# Patient Record
Sex: Female | Born: 1955 | Race: Black or African American | Hispanic: No | Smoking: Former smoker
Health system: Southern US, Community
[De-identification: ages and names within clinical notes are randomized; demographics above are authoritative.]

## PROBLEM LIST (undated history)

## (undated) DIAGNOSIS — M549 Dorsalgia, unspecified: Secondary | ICD-10-CM

## (undated) DIAGNOSIS — C801 Malignant (primary) neoplasm, unspecified: Secondary | ICD-10-CM

## (undated) DIAGNOSIS — K219 Gastro-esophageal reflux disease without esophagitis: Secondary | ICD-10-CM

## (undated) DIAGNOSIS — Z8719 Personal history of other diseases of the digestive system: Secondary | ICD-10-CM

## (undated) DIAGNOSIS — I10 Essential (primary) hypertension: Secondary | ICD-10-CM

## (undated) DIAGNOSIS — Z87898 Personal history of other specified conditions: Secondary | ICD-10-CM

## (undated) DIAGNOSIS — M109 Gout, unspecified: Secondary | ICD-10-CM

## (undated) HISTORY — DX: Gastro-esophageal reflux disease without esophagitis: K21.9

## (undated) HISTORY — DX: Personal history of other specified conditions: Z87.898

## (undated) HISTORY — PX: WISDOM TOOTH EXTRACTION: SHX21

## (undated) HISTORY — DX: Essential (primary) hypertension: I10

## (undated) HISTORY — PX: OTHER SURGICAL HISTORY: SHX169

## (undated) HISTORY — PX: MULTIPLE TOOTH EXTRACTIONS: SHX2053

## (undated) HISTORY — PX: TONSILLECTOMY: SUR1361

## (undated) HISTORY — DX: Personal history of other diseases of the digestive system: Z87.19

## (undated) HISTORY — PX: NO PAST SURGERIES: SHX2092

---

## 2011-05-05 ENCOUNTER — Emergency Department (HOSPITAL_COMMUNITY)
Admission: EM | Admit: 2011-05-05 | Discharge: 2011-05-05 | Disposition: A | Discharge status: Home / Self Care | Payer: Medicaid Other | Attending: Emergency Medicine | Admitting: Emergency Medicine

## 2011-05-05 ENCOUNTER — Encounter: Payer: Self-pay | Admitting: *Deleted

## 2011-05-05 ENCOUNTER — Emergency Department (HOSPITAL_COMMUNITY): Payer: Medicaid Other

## 2011-05-05 DIAGNOSIS — R059 Cough, unspecified: Secondary | ICD-10-CM | POA: Insufficient documentation

## 2011-05-05 DIAGNOSIS — R05 Cough: Secondary | ICD-10-CM | POA: Insufficient documentation

## 2011-05-05 DIAGNOSIS — R0602 Shortness of breath: Secondary | ICD-10-CM | POA: Insufficient documentation

## 2011-05-05 DIAGNOSIS — R5381 Other malaise: Secondary | ICD-10-CM | POA: Insufficient documentation

## 2011-05-05 DIAGNOSIS — R6889 Other general symptoms and signs: Secondary | ICD-10-CM

## 2011-05-05 DIAGNOSIS — R042 Hemoptysis: Secondary | ICD-10-CM | POA: Insufficient documentation

## 2011-05-05 DIAGNOSIS — R000 Tachycardia, unspecified: Secondary | ICD-10-CM | POA: Insufficient documentation

## 2011-05-05 DIAGNOSIS — R0789 Other chest pain: Secondary | ICD-10-CM | POA: Insufficient documentation

## 2011-05-05 DIAGNOSIS — F17200 Nicotine dependence, unspecified, uncomplicated: Secondary | ICD-10-CM | POA: Insufficient documentation

## 2011-05-05 DIAGNOSIS — H9209 Otalgia, unspecified ear: Secondary | ICD-10-CM | POA: Insufficient documentation

## 2011-05-05 LAB — DIFFERENTIAL
Eosinophils Absolute: 0.1 10*3/uL (ref 0.0–0.7)
Lymphocytes Relative: 16 % (ref 12–46)
Lymphs Abs: 0.9 10*3/uL (ref 0.7–4.0)
Monocytes Relative: 9 % (ref 3–12)
Neutrophils Relative %: 74 % (ref 43–77)

## 2011-05-05 LAB — CBC
Hemoglobin: 14.2 g/dL (ref 12.0–15.0)
MCH: 33.1 pg (ref 26.0–34.0)
MCV: 95.6 fL (ref 78.0–100.0)
RBC: 4.29 MIL/uL (ref 3.87–5.11)
WBC: 5.4 10*3/uL (ref 4.0–10.5)

## 2011-05-05 LAB — POCT I-STAT, CHEM 8
BUN: 5 mg/dL — ABNORMAL LOW (ref 6–23)
Creatinine, Ser: 0.9 mg/dL (ref 0.50–1.10)
Glucose, Bld: 106 mg/dL — ABNORMAL HIGH (ref 70–99)
Hemoglobin: 15 g/dL (ref 12.0–15.0)
Potassium: 4.1 mEq/L (ref 3.5–5.1)

## 2011-05-05 IMAGING — CR DG CHEST 2V
2 series · 2 of 2 positions shown · non-contrast
Comparison: None available.

CLINICAL DATA: Shortness of breath.  Hemoptysis.

CHEST - 2 VIEW

[w chest pa]
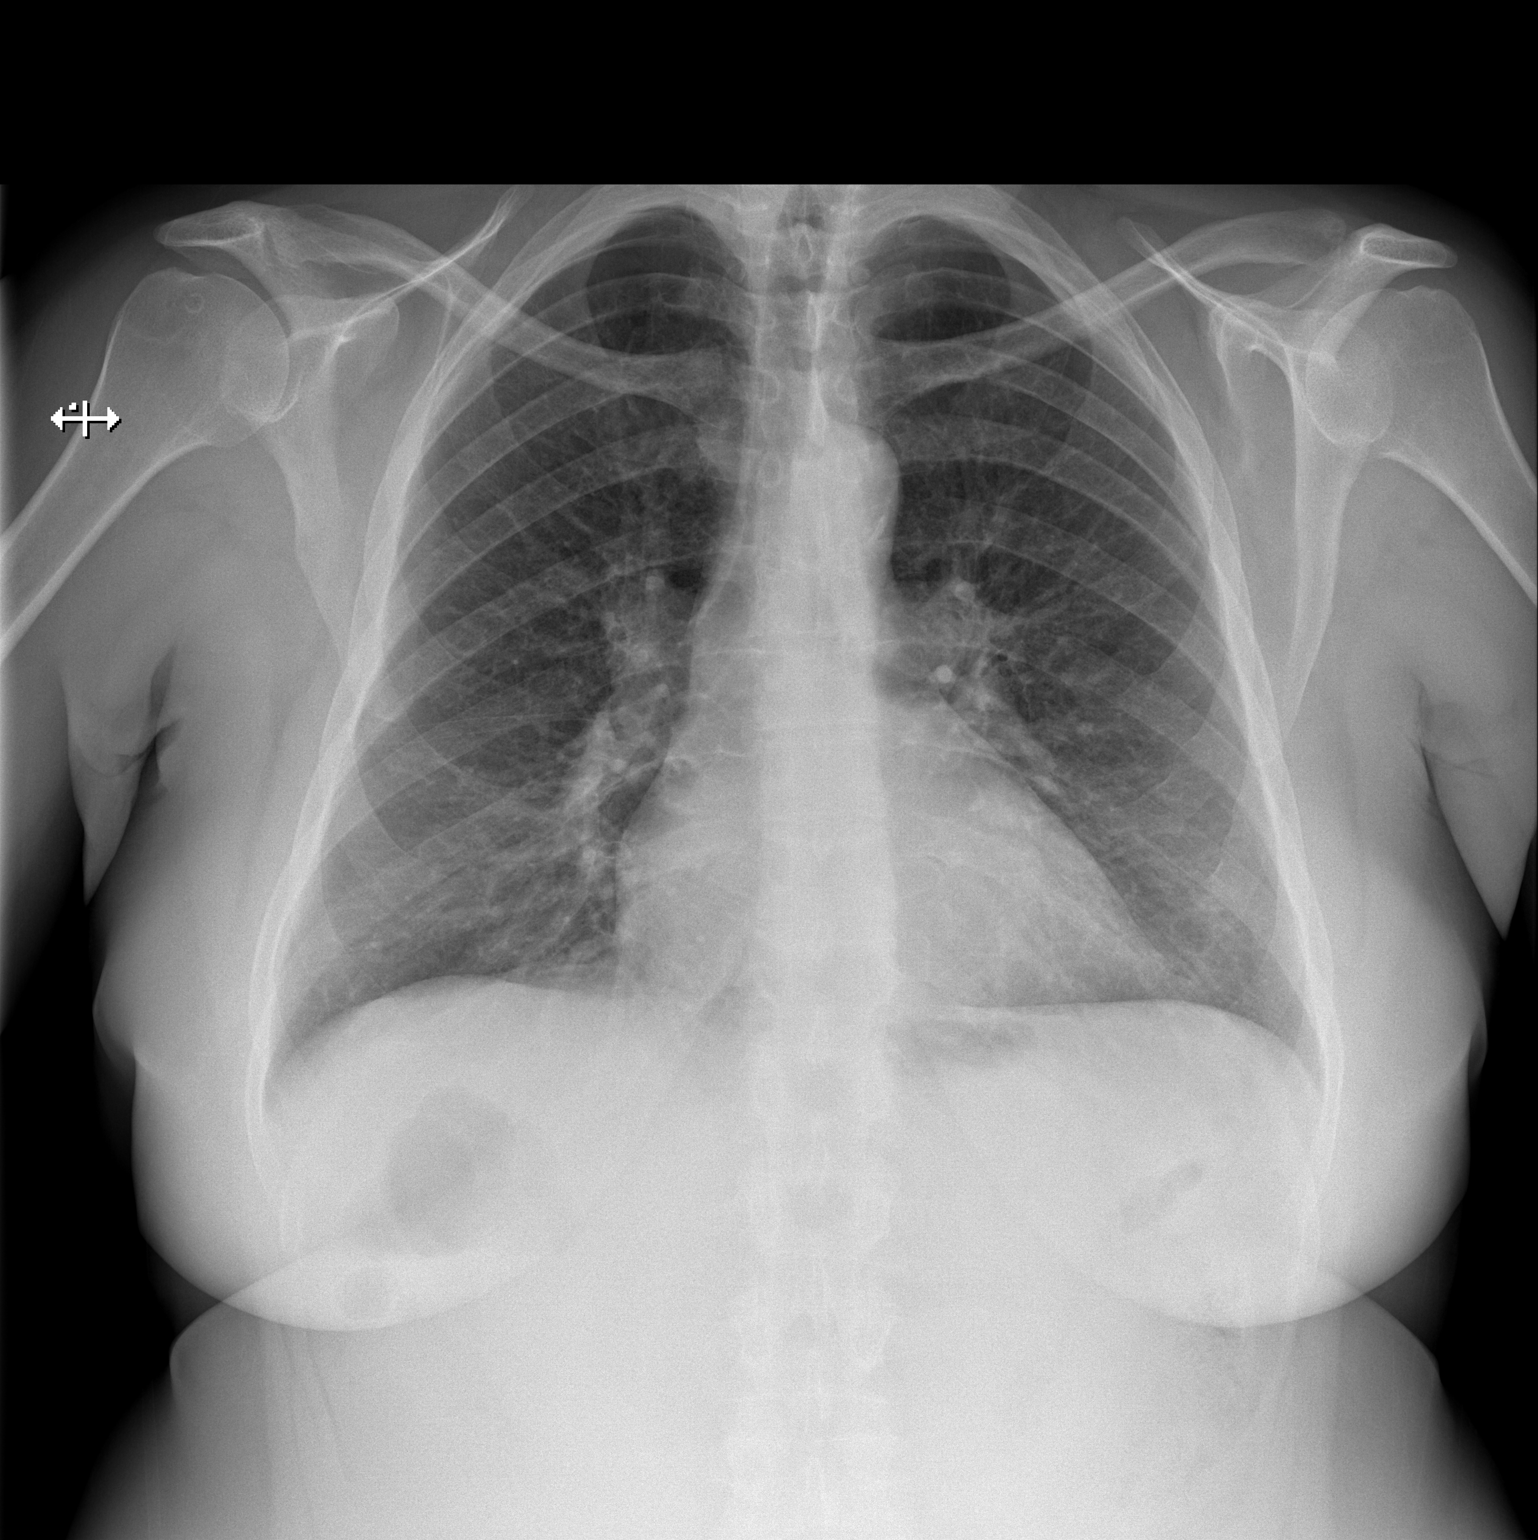

[w chest lat]
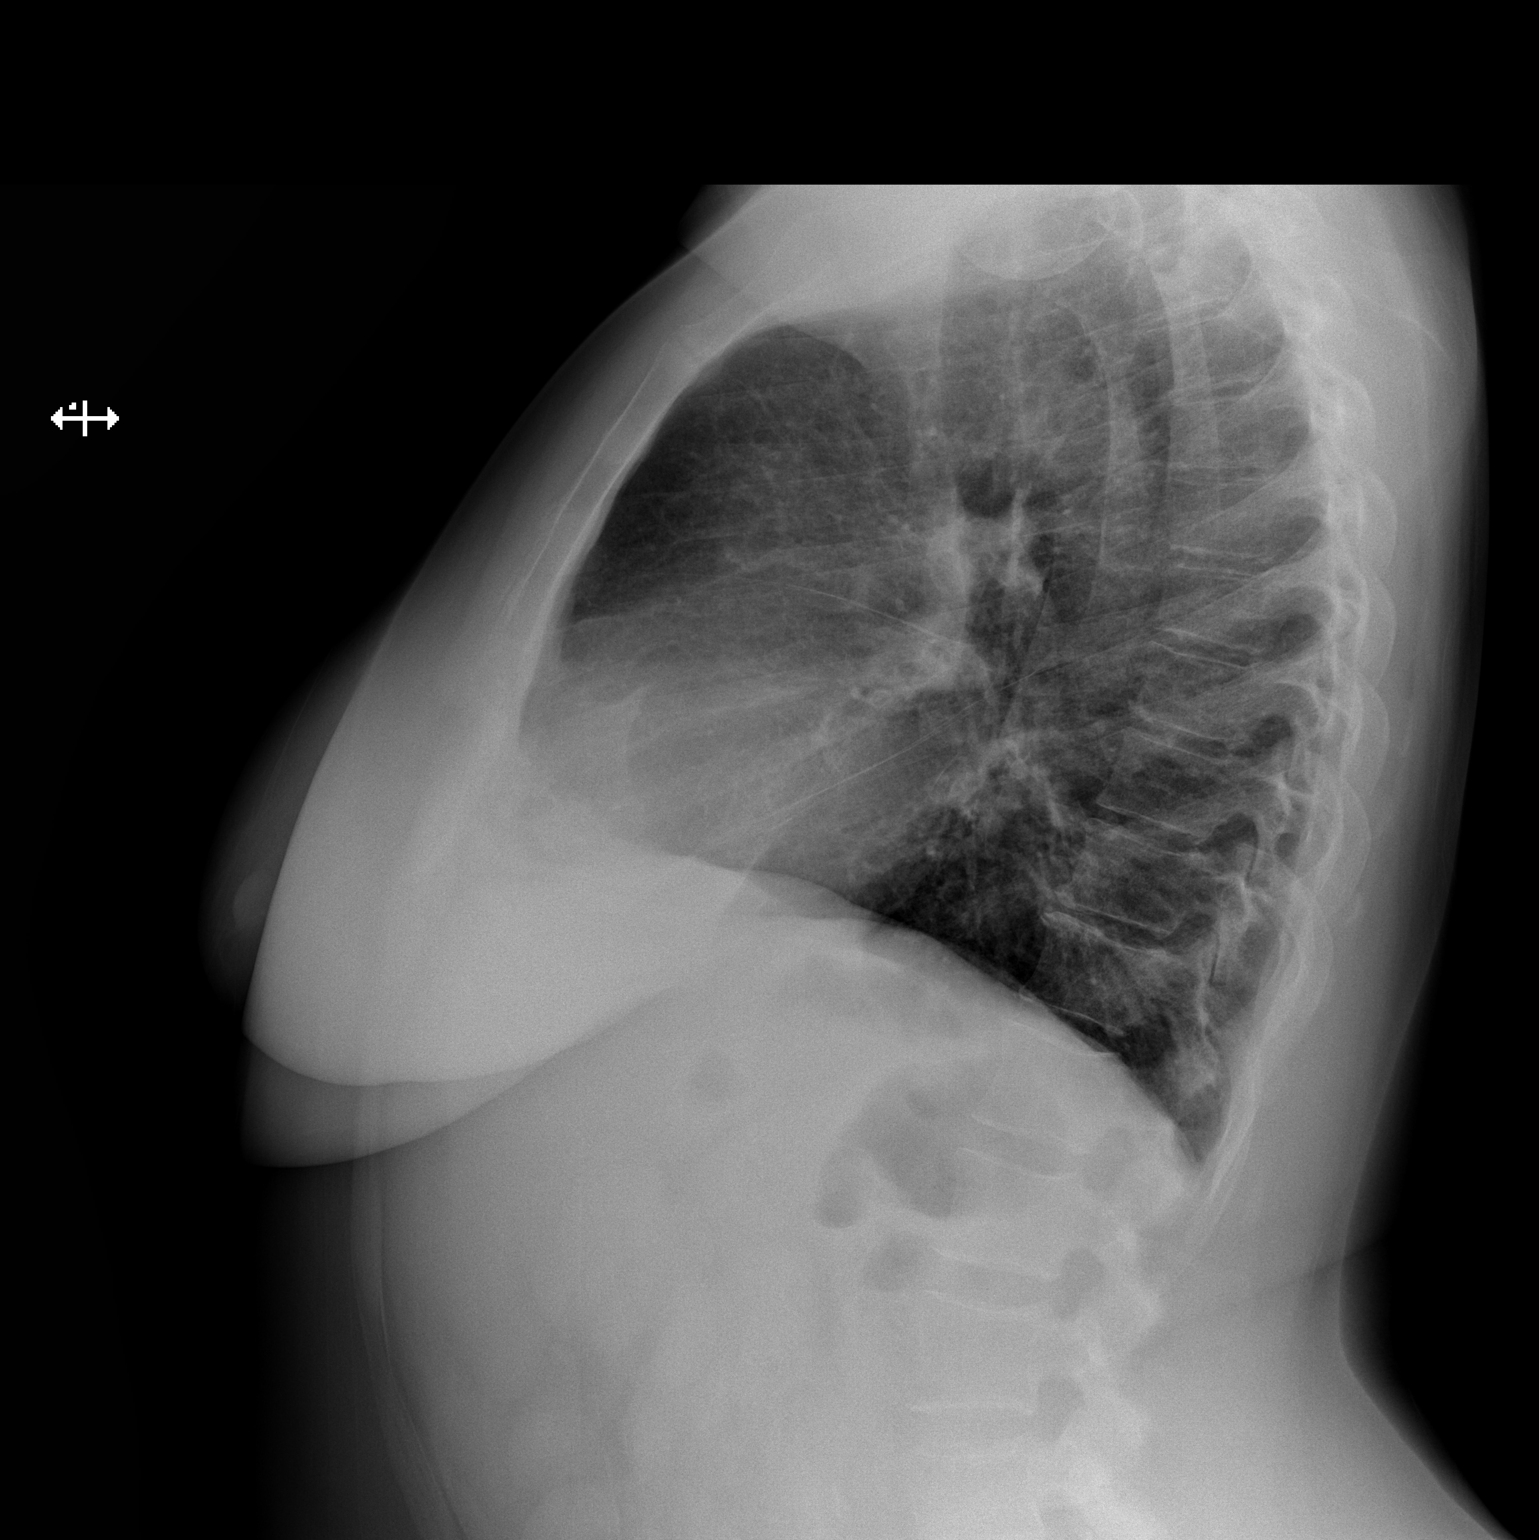

[2 of 2 positions shown; findings below may reference images not displayed]

FINDINGS: The heart size is normal.  The lungs are clear.  The
visualized soft tissues and bony thorax are unremarkable.
IMPRESSION: Negative chest.

## 2011-05-05 MED ORDER — SODIUM CHLORIDE 0.9 % IV BOLUS (SEPSIS)
1000.0000 mL | Freq: Once | INTRAVENOUS | Status: AC
  Administered 2011-05-05: 1000 mL via INTRAVENOUS

## 2011-05-05 MED ORDER — HYDROCODONE-HOMATROPINE 5-1.5 MG/5ML PO SYRP: 5.0000 mL | ORAL_SOLUTION | Freq: Four times a day (QID) | ORAL | Status: AC | PRN

## 2011-05-05 NOTE — ED Notes (Signed)
Pt reports gargling with warm water for a sore throat this am and when she spit out water, blood was mixed in it. C/o cough 2 days ago, no cough now. Also reports L ear pain, throbbing.

## 2011-05-05 NOTE — ED Provider Notes (Signed)
Medical screening examination/treatment/procedure(s) were performed by non-physician practitioner and as supervising physician I was immediately available for consultation/collaboration.   Faizan Geraci, MD 05/05/11 1258 

## 2011-05-05 NOTE — ED Provider Notes (Signed)
History     CSN: [id number]  Arrival date & time 05/05/11  1191   First MD Initiated Contact with Patient 05/05/11 [phone]      Chief Complaint  Patient presents with  . Sore Throat  . Otalgia    (Consider location/radiation/quality/duration/timing/severity/associated sxs/prior treatment) HPI Comments: Pt presents w flu-like s/s including NS, chills, congestion, cough, and sore throat. Pt is current smoker for 38 years, .5-1 pack a day.The patient denies headaches, neck pain, weakness, vision changes, severe abdominal pain, inability to eat or drink, difficulty breathing, SOB, wheezing, chest pain.    Patient is a 55 y.o. female presenting with pharyngitis, ear pain, and cough. The history is provided by the patient.  Sore Throat This is a new problem. The current episode started yesterday. The problem occurs constantly. The problem has been gradually worsening. Associated symptoms include coughing, fatigue and a sore throat. Pertinent negatives include no abdominal pain, anorexia, arthralgias, change in bowel habit, chest pain, chills, congestion, diaphoresis, fever, headaches, joint swelling, myalgias, nausea, neck pain, numbness, rash, swollen glands, urinary symptoms, vertigo, visual change, vomiting or weakness. The symptoms are aggravated by nothing. She has tried nothing for the symptoms. The treatment provided no relief.  Otalgia This is a new problem. The current episode started yesterday. There is pain in the left ear. The problem occurs constantly. The problem has not changed since onset.There has been no fever. The pain is at a severity of 4/10. The pain is mild. Associated symptoms include sore throat and cough. Pertinent negatives include no ear discharge, no headaches, no hearing loss, no rhinorrhea, no abdominal pain, no diarrhea, no vomiting, no neck pain and no rash. Her past medical history does not include chronic ear infection, hearing loss or tympanostomy tube.  Cough This  is a new problem. The current episode started 2 days ago. The problem occurs hourly. The problem has not changed since onset.The cough is non-productive. Associated symptoms include sweats, ear pain and sore throat. Pertinent negatives include no chest pain, no chills, no weight loss, no ear congestion, no headaches, no rhinorrhea, no myalgias, no shortness of breath, no wheezing and no eye redness. She has tried nothing for the symptoms. The treatment provided no relief. Her past medical history does not include bronchitis, pneumonia, bronchiectasis, COPD, emphysema or asthma.    History reviewed. No pertinent past medical history.  History reviewed. No pertinent past surgical history.  No family history on file.  History  Substance Use Topics  . Smoking status: Current Everyday Smoker  . Smokeless tobacco: Not on file  . Alcohol Use: No    OB History    Grav Para Term Preterm Abortions TAB SAB Ect Mult Living                  Review of Systems  Constitutional: Positive for fatigue. Negative for fever, chills, weight loss and diaphoresis.  HENT: Positive for ear pain and sore throat. Negative for hearing loss, congestion, rhinorrhea, sneezing, neck pain, neck stiffness, sinus pressure, tinnitus and ear discharge.   Eyes: Negative for redness and visual disturbance.  Respiratory: Positive for cough and chest tightness. Negative for shortness of breath and wheezing.   Cardiovascular: Negative for chest pain and palpitations.  Gastrointestinal: Negative for nausea, vomiting, abdominal pain, diarrhea, anorexia and change in bowel habit.  Genitourinary: Negative for dysuria.  Musculoskeletal: Negative for myalgias, joint swelling and arthralgias.  Skin: Negative for color change and rash.  Neurological: Negative for dizziness, vertigo, weakness, numbness  and headaches.  Hematological: Does not bruise/bleed easily.  Psychiatric/Behavioral: Negative for confusion.  All other systems  reviewed and are negative.    Allergies  Review of patient's allergies indicates no known allergies.  Home Medications   Current Outpatient Rx  Name Route Sig Dispense Refill  . NYQUIL PO Oral Take 2 capsules by mouth at bedtime as needed. Cold symptoms       BP 142/87  Pulse 116  Temp(Src) 98.2 F (36.8 C) (Oral)  Resp 18  SpO2 97%  Physical Exam  Nursing note and vitals reviewed. Constitutional: She is oriented to person, place, and time. She appears well-developed and well-nourished. No distress.  HENT:  Head: Normocephalic and atraumatic.  Right Ear: External ear normal.  Left Ear: External ear normal.  Nose: Nose normal.  Mouth/Throat: Oropharynx is clear and moist. No oropharyngeal exudate.  Eyes: EOM are normal.  Neck: Normal range of motion. Neck supple.  Cardiovascular: Regular rhythm and normal heart sounds.  Tachycardia present.   Pulmonary/Chest: Effort normal and breath sounds normal. No accessory muscle usage or stridor. Not tachypneic. No respiratory distress. She has no wheezes. She has no rhonchi. She has no rales.  Abdominal: Soft. She exhibits no distension. There is no tenderness.  Musculoskeletal: Normal range of motion.  Lymphadenopathy:    She has no cervical adenopathy.  Neurological: She is alert and oriented to person, place, and time.  Skin: Skin is warm and dry. She is not diaphoretic.    ED Course  Procedures (including critical care time)  Labs Reviewed  POCT I-STAT, CHEM 8 - Abnormal; Notable for the following:    BUN 5 (*)    Glucose, Bld 106 (*)    All other components within normal limits  CBC  DIFFERENTIAL  I-STAT, CHEM 8   Dg Chest 2 View  05/05/2011  *RADIOLOGY REPORT*  Clinical Data: Shortness of breath.  Hemoptysis.  CHEST - 2 VIEW  Comparison: None available.  Findings: The heart size is normal.  The lungs are clear.  The visualized soft tissues and bony thorax are unremarkable.  IMPRESSION: Negative chest.  Original  Report Authenticated By: Jamesetta Orleans. MATTERN, M.D.     No diagnosis found. Patient came in tachycardic with a heart rate of 116.  Fluid bolus has been given.  Her rate reevaluated and is now 104.  Patient being discharged per below.  MDM  Flu like symptoms   Patient presents with flulike symptoms.  Due to patient's presentation and physical exam a chest x-ray was not ordered per likely diagnosis of flu.  Discussed the cost versus benefit of Tamiflu treatment with the patient.  The patient understands that her symptoms are greater than the recommended 24-48 hour window of treatment.  Patient will be discharged with instructions to orally hydrate, rest, and use over-the-counter medications such as anti-inflammatories ibuprofen and Aleve for muscle aches and Tylenol for fever.  Patient will also be given a cough suppressant.    [place], [place] 05/05/11 1105

## 2012-04-12 ENCOUNTER — Encounter (HOSPITAL_COMMUNITY): Payer: Self-pay | Admitting: *Deleted

## 2012-04-12 ENCOUNTER — Emergency Department (HOSPITAL_COMMUNITY)
Admission: EM | Admit: 2012-04-12 | Discharge: 2012-04-12 | Disposition: A | Discharge status: Home / Self Care | Payer: Medicaid Other | Attending: Emergency Medicine | Admitting: Emergency Medicine

## 2012-04-12 DIAGNOSIS — H6090 Unspecified otitis externa, unspecified ear: Secondary | ICD-10-CM

## 2012-04-12 DIAGNOSIS — M542 Cervicalgia: Secondary | ICD-10-CM | POA: Insufficient documentation

## 2012-04-12 DIAGNOSIS — J029 Acute pharyngitis, unspecified: Secondary | ICD-10-CM | POA: Insufficient documentation

## 2012-04-12 DIAGNOSIS — H60399 Other infective otitis externa, unspecified ear: Secondary | ICD-10-CM | POA: Insufficient documentation

## 2012-04-12 DIAGNOSIS — F17200 Nicotine dependence, unspecified, uncomplicated: Secondary | ICD-10-CM | POA: Insufficient documentation

## 2012-04-12 MED ORDER — ANTIPYRINE-BENZOCAINE 5.4-1.4 % OT SOLN
3.0000 [drp] | OTIC | Status: DC | PRN
  Administered 2012-04-12: 3 [drp] via OTIC
  Filled 2012-04-12: qty 10

## 2012-04-12 MED ORDER — ACYCLOVIR 800 MG PO TABS: 800.0000 mg | ORAL_TABLET | Freq: Every day | ORAL | Status: DC

## 2012-04-12 MED ORDER — DICLOXACILLIN SODIUM 500 MG PO CAPS: 500.0000 mg | ORAL_CAPSULE | Freq: Four times a day (QID) | ORAL | Status: DC

## 2012-04-12 NOTE — Discharge Instructions (Signed)
Otitis Externa Otitis externa is a bacterial or fungal infection of the outer ear canal. This is the area from the eardrum to the outside of the ear. Otitis externa is sometimes called "swimmer's ear." CAUSES  Possible causes of infection include:  Swimming in dirty water.  Moisture remaining in the ear after swimming or bathing.  Mild injury (trauma) to the ear.  Objects stuck in the ear (foreign body).  Cuts or scrapes (abrasions) on the outside of the ear. SYMPTOMS  The first symptom of infection is often itching in the ear canal. Later signs and symptoms may include swelling and redness of the ear canal, ear pain, and yellowish-white fluid (pus) coming from the ear. The ear pain may be worse when pulling on the earlobe. DIAGNOSIS  Your caregiver will perform a physical exam. A sample of fluid may be taken from the ear and examined for bacteria or fungi. TREATMENT  Antibiotic ear drops are often given for 10 to 14 days. Treatment may also include pain medicine or corticosteroids to reduce itching and swelling. PREVENTION   Keep your ear dry. Use the corner of a towel to absorb water out of the ear canal after swimming or bathing.  Avoid scratching or putting objects inside your ear. This can damage the ear canal or remove the protective wax that lines the canal. This makes it easier for bacteria and fungi to grow.  Avoid swimming in lakes, polluted water, or poorly chlorinated pools.  You may use ear drops made of rubbing alcohol and vinegar after swimming. Combine equal parts of white vinegar and alcohol in a bottle. Put 3 or 4 drops into each ear after swimming. HOME CARE INSTRUCTIONS   Apply antibiotic ear drops to the ear canal as prescribed by your caregiver.  Only take over-the-counter or prescription medicines for pain, discomfort, or fever as directed by your caregiver.  If you have diabetes, follow any additional treatment instructions from your caregiver.  Keep all  follow-up appointments as directed by your caregiver. SEEK MEDICAL CARE IF:   You have a fever.  Your ear is still red, swollen, painful, or draining pus after 3 days.  Your redness, swelling, or pain gets worse.  You have a severe headache.  You have redness, swelling, pain, or tenderness in the area behind your ear. MAKE SURE YOU:   Understand these instructions.  Will watch your condition.  Will get help right away if you are not doing well or get worse. Document Released: 04/25/2005 Document Revised: 07/18/2011 Document Reviewed: 05/12/2011 [employer] Patient Information 2013 [employer].  

## 2012-04-12 NOTE — ED Notes (Signed)
Lt earache for months since this past summer.  It occurs intermittently

## 2012-04-12 NOTE — ED Provider Notes (Signed)
History   This chart was scribed for Janice Adams. Oletta Lamas, MD by Sofie Rower, ED Scribe. The patient was seen in room TR11C/TR11C and the patient's care was started at 4:02PM.     CSN: [id number]  Arrival date & time 04/12/12  1541   First MD Initiated Contact with Patient 04/12/12 1602      Chief Complaint  Patient presents with  . Otalgia    (Consider location/radiation/quality/duration/timing/severity/associated sxs/prior treatment) The history is provided by the patient. No language interpreter was used.    Akeelah Seppala is a 56 y.o. female , who presents to the Emergency Department complaining of intermittent, progressively worsening, otalgia, located at the left ear, onset yesterday (04/11/12).  Associated symptoms include sore throat. The pt reports she has been experiencing an intermittent ear pain for the past three months, however, the pain has become increasingly worse over the past two days, generating a sore throat and prompting the pt's concern and desire for medical evaluation at [facility] today.  The pt denies any hearing loss, headache, or rash associated with the otalgia.   The pt is a current everyday smoker, however, she does not drink alcohol.     History reviewed. No pertinent past medical history.  History reviewed. No pertinent past surgical history.  History reviewed. No pertinent family history.  History  Substance Use Topics  . Smoking status: Current Every Day Smoker  . Smokeless tobacco: Not on file  . Alcohol Use: No    OB History    Grav Para Term Preterm Abortions TAB SAB Ect Mult Living                  Review of Systems  Constitutional: Negative for fever and chills.  HENT: Positive for ear pain, sore throat and neck pain. Negative for hearing loss, facial swelling, trouble swallowing, neck stiffness and voice change.   Respiratory: Negative for cough and shortness of breath.   Skin: Negative for rash and wound.  Neurological: Negative for  headaches.    Allergies  Review of patient's allergies indicates no known allergies.  Home Medications   Current Outpatient Rx  Name  Route  Sig  Dispense  Refill  . ACYCLOVIR 800 MG PO TABS   Oral   Take 1 tablet (800 mg total) by mouth 5 (five) times daily.   35 tablet   0   . DICLOXACILLIN SODIUM 500 MG PO CAPS   Oral   Take 1 capsule (500 mg total) by mouth 4 (four) times daily.   28 capsule   0     BP 106/74  Pulse 105  Temp 98.2 F (36.8 C) (Oral)  Resp 18  SpO2 97%  Physical Exam  Nursing note and vitals reviewed. Constitutional: She appears well-developed and well-nourished. No distress.  HENT:  Head: Atraumatic.  Right Ear: Tympanic membrane and ear canal normal.  Left Ear: Tympanic membrane normal.  Nose: Nose normal.       Left ear canal is erythematous and edematous. Abrasions that appear to be healing along floor of calan, no active drainage, purulence.   Neck: Normal range of motion.  Pulmonary/Chest: Effort normal.  Neurological: She is alert.  Skin: Skin is warm and dry. No rash noted. She is not diaphoretic.    ED Course  Procedures (including critical care time)  DIAGNOSTIC STUDIES: Oxygen Saturation is 97% on room air, normal by my interpretation.    COORDINATION OF CARE:   4:24 PM- Treatment plan concerning application of  antibiotics discussed with patient. Pt agrees with treatment.     Labs Reviewed - No data to display No results found.   1. Otitis externa       MDM  I personally performed the services described in this documentation, which was scribed in my presence. The recorded information has been reviewed and is accurate.   Pt with canal irritation causing pain, will treat as otitis externa, could be healign abrasions or shingles rash.  No other rash to confirm on rest of face, but will treat with both PCN and acyclovir.    Janice Adams. Barney Gertsch, MD 04/12/12 [phone]

## 2012-04-30 ENCOUNTER — Emergency Department (HOSPITAL_COMMUNITY)
Admission: EM | Admit: 2012-04-30 | Discharge: 2012-04-30 | Disposition: A | Discharge status: Home / Self Care | Payer: Medicaid Other | Attending: Emergency Medicine | Admitting: Emergency Medicine

## 2012-04-30 ENCOUNTER — Encounter (HOSPITAL_COMMUNITY): Payer: Self-pay | Admitting: *Deleted

## 2012-04-30 DIAGNOSIS — H9209 Otalgia, unspecified ear: Secondary | ICD-10-CM | POA: Insufficient documentation

## 2012-04-30 DIAGNOSIS — F17200 Nicotine dependence, unspecified, uncomplicated: Secondary | ICD-10-CM | POA: Insufficient documentation

## 2012-04-30 MED ORDER — IBUPROFEN 800 MG PO TABS: 800.0000 mg | ORAL_TABLET | Freq: Three times a day (TID) | ORAL | Status: DC

## 2012-04-30 NOTE — ED Notes (Signed)
Pt is here with continuous ear pain on the left side

## 2012-04-30 NOTE — ED Notes (Signed)
Pt here 04/12/2012 for external otitis. Returns today because ear pain persists. Ear feels "stopped up".

## 2012-04-30 NOTE — ED Provider Notes (Signed)
History   This chart was scribed for Bitania Shankland B. Bernette Mayers, MD, by Frederik Pear, ER scribe. The patient was seen in room TR04C/TR04C and the patient's care was started at 1329.    CSN: [id number]  Arrival date & time 04/30/12  1309   First MD Initiated Contact with Patient 04/30/12 1329      Chief Complaint  Patient presents with  . Otalgia    (Consider location/radiation/quality/duration/timing/severity/associated sxs/prior treatment) HPI [place] Dalporto is a 56 y.o. female who presents to the Emergency Department complaining of intermittent, gradually worsening left-sided ear pain that began several weeks ago. She states that she was seen in the ED on 12/5 and given a course of antibiotics, which she has not finished. She states that she has been taking ibuprofen at home, which has provided relief.   No PCP.  History reviewed. No pertinent past medical history.  No past surgical history on file.  No family history on file.  History  Substance Use Topics  . Smoking status: Current Every Day Smoker  . Smokeless tobacco: Not on file  . Alcohol Use: No    OB History    Grav Para Term Preterm Abortions TAB SAB Ect Mult Living                  Review of Systems A complete 10 system review of systems was obtained and all systems are negative except as noted in the HPI and PMH.   Allergies  Review of patient's allergies indicates no known allergies.  Home Medications   Current Outpatient Rx  Name  Route  Sig  Dispense  Refill  . ACYCLOVIR 800 MG PO TABS   Oral   Take 1 tablet (800 mg total) by mouth 5 (five) times daily.   35 tablet   0   . DICLOXACILLIN SODIUM 500 MG PO CAPS   Oral   Take 1 capsule (500 mg total) by mouth 4 (four) times daily.   28 capsule   0     BP 101/70  Pulse 94  Temp 98.3 F (36.8 C) (Oral)  Resp 18  SpO2 100%  Physical Exam  Constitutional: She is oriented to person, place, and time. She appears well-developed and  well-nourished.  HENT:  Head: Normocephalic and atraumatic.  Right Ear: Tympanic membrane, external ear and ear canal normal.  Left Ear: Tympanic membrane, external ear and ear canal normal.  Neck: Neck supple.  Pulmonary/Chest: Effort normal.  Neurological: She is alert and oriented to person, place, and time. No cranial nerve deficit.  Psychiatric: She has a normal mood and affect. Her behavior is normal.    ED Course  Procedures (including critical care time)  DIAGNOSTIC STUDIES: Oxygen Saturation is 100% on room air, normal by my interpretation.    COORDINATION OF CARE:  13:32- Discussed planned course of treatment with the patient, including finishing her prescribed course of antibiotics and ibuprfen, who is agreeable at this time.  Labs Reviewed - No data to display No results found.   No diagnosis found.    MDM  Otalgia without evidence of otitis. She was given Rx for presumptive otitis externa about 2 weeks ago but did not finish Abx. Advised to finish those as directed. ENT referral if pain persists.   I personally performed the services described in this documentation, which was scribed in my presence. The recorded information has been reviewed and is accurate.         Neddie Steedman B. Bernette Mayers, MD 04/30/12  1353 

## 2012-06-11 ENCOUNTER — Other Ambulatory Visit: Payer: Self-pay | Admitting: Internal Medicine

## 2012-06-11 DIAGNOSIS — Z803 Family history of malignant neoplasm of breast: Secondary | ICD-10-CM

## 2012-06-11 DIAGNOSIS — Z1231 Encounter for screening mammogram for malignant neoplasm of breast: Secondary | ICD-10-CM

## 2012-07-02 ENCOUNTER — Ambulatory Visit
Admission: RE | Admit: 2012-07-02 | Discharge: 2012-07-02 | Disposition: A | Discharge status: Home / Self Care | Payer: Medicaid Other | Source: Ambulatory Visit | Attending: Internal Medicine | Admitting: Internal Medicine

## 2012-07-03 ENCOUNTER — Ambulatory Visit: Payer: Medicaid Other

## 2012-10-15 ENCOUNTER — Emergency Department (HOSPITAL_COMMUNITY): Payer: Medicaid Other

## 2012-10-15 ENCOUNTER — Emergency Department (HOSPITAL_COMMUNITY)
Admission: EM | Admit: 2012-10-15 | Discharge: 2012-10-15 | Disposition: A | Discharge status: Home / Self Care | Payer: Medicaid Other | Attending: Emergency Medicine | Admitting: Emergency Medicine

## 2012-10-15 ENCOUNTER — Encounter (HOSPITAL_COMMUNITY): Payer: Self-pay | Admitting: Family Medicine

## 2012-10-15 DIAGNOSIS — H9209 Otalgia, unspecified ear: Secondary | ICD-10-CM | POA: Insufficient documentation

## 2012-10-15 DIAGNOSIS — R059 Cough, unspecified: Secondary | ICD-10-CM | POA: Insufficient documentation

## 2012-10-15 DIAGNOSIS — R05 Cough: Secondary | ICD-10-CM | POA: Insufficient documentation

## 2012-10-15 DIAGNOSIS — J029 Acute pharyngitis, unspecified: Secondary | ICD-10-CM | POA: Insufficient documentation

## 2012-10-15 DIAGNOSIS — F17200 Nicotine dependence, unspecified, uncomplicated: Secondary | ICD-10-CM | POA: Insufficient documentation

## 2012-10-15 MED ORDER — HYDROCOD POLST-CHLORPHEN POLST 10-8 MG/5ML PO LQCR
5.0000 mL | Freq: Two times a day (BID) | ORAL | Status: DC | PRN
Start: 2012-10-15 — End: 2012-11-08

## 2012-10-15 NOTE — ED Provider Notes (Signed)
History    This chart was scribed for non-physician practitioner working with Loren Racer, MD by Smitty Pluck, ED scribe. This patient was seen in room TR10C/TR10C and the patient's care was started at 5:11 PM.   CSN: [id number]  Arrival date & time 10/15/12  1550    Chief Complaint  Patient presents with  . Sore Throat  . Otalgia  . Cough     The history is provided by the patient and medical records. No language interpreter was used.   HPI Comments: Janice Adams is a 57 y.o. female who presents to the Emergency Department complaining of constant, moderate sore throat onset 3 days ago. She reports that she has associated cough and left otalgia. Pt has pain with swallowing. Pt states she has associated chest soreness due to cough. Pt denies fever, chills, nausea, vomiting, diarrhea, weakness, SOB, trouble swallowing and any other pain. Denies any pertinent medical hx. Pt state she smokes cigarettes.    History reviewed. No pertinent past medical history.  History reviewed. No pertinent past surgical history.  History reviewed. No pertinent family history.  History  Substance Use Topics  . Smoking status: Current Every Day Smoker  . Smokeless tobacco: Not on file  . Alcohol Use: No    OB History   Grav Para Term Preterm Abortions TAB SAB Ect Mult Living                  Review of Systems  Constitutional: Negative for fever and chills.  HENT: Positive for ear pain and sore throat.   Respiratory: Positive for cough. Negative for shortness of breath.   Gastrointestinal: Negative for nausea and vomiting.  Neurological: Negative for weakness.    Allergies  Review of patient's allergies indicates no known allergies.  Home Medications  No current outpatient prescriptions on file.  BP 123/86  Pulse 104  Temp(Src) 98.9 F (37.2 C)  Resp 18  SpO2 96%  Physical Exam  Nursing note and vitals reviewed. Constitutional: She is oriented to person, place, and time.  She appears well-developed and well-nourished. No distress.  HENT:  Head: Normocephalic and atraumatic.  Right Ear: External ear normal.  Left Ear: External ear normal.  Mouth/Throat: Posterior oropharyngeal erythema present.  Eyes: Conjunctivae and EOM are normal.  Neck: Neck supple. No tracheal deviation present.  Cardiovascular: Normal rate, regular rhythm and normal heart sounds.   Pulmonary/Chest: Effort normal and breath sounds normal. No respiratory distress. She has no wheezes.  Musculoskeletal: Normal range of motion.  Neurological: She is alert and oriented to person, place, and time.  Skin: Skin is warm and dry.  Psychiatric: She has a normal mood and affect. Her behavior is normal.    ED Course  Procedures (including critical care time) DIAGNOSTIC STUDIES: Oxygen Saturation is 96% on room air, adequate by my interpretation.    COORDINATION OF CARE: 5:38 PM Discussed ED treatment with pt and pt agrees.     Labs Reviewed - No data to display Dg Chest 2 View  10/15/2012   *RADIOLOGY REPORT*  Clinical Data: Sore throat and cough.  CHEST - 2 VIEW  Comparison: 05/05/2011  Findings: Mild chronic lung disease with areas of parenchymal scarring identified bilaterally.  Lung volumes are normal.  No infiltrate, edema, pleural fluid or nodule is identified.  Heart size and mediastinal contours are within normal limits.  The bony thorax is unremarkable.  IMPRESSION: No active disease.  Stable chronic changes.   Original Report Authenticated By: Irish Lack,  M.D.     1. Cough       MDM  Will treat symptomatically with cough syrup      I personally performed the services described in this documentation, which was scribed in my presence. The recorded information has been reviewed and is accurate.    Teressa Lower, NP 10/15/12 2053

## 2012-10-15 NOTE — ED Notes (Signed)
Pt reports a 3 day Hx of sore throat and a dry cough.

## 2012-10-15 NOTE — ED Notes (Signed)
Pt sts sore throat, cough and left ear pain for a few days.

## 2012-10-16 NOTE — ED Provider Notes (Signed)
Medical screening examination/treatment/procedure(s) were performed by non-physician practitioner and as supervising physician I was immediately available for consultation/collaboration.   Indria Bishara, MD 10/16/12 0217 

## 2012-11-08 ENCOUNTER — Ambulatory Visit: Payer: Medicaid Other | Attending: Family Medicine | Admitting: Family Medicine

## 2012-11-08 VITALS — BP 108/72 | HR 91 | Temp 98.6°F | Resp 18 | Wt 173.2 lb

## 2012-11-08 DIAGNOSIS — J329 Chronic sinusitis, unspecified: Secondary | ICD-10-CM

## 2012-11-08 DIAGNOSIS — J029 Acute pharyngitis, unspecified: Secondary | ICD-10-CM

## 2012-11-08 DIAGNOSIS — Z87891 Personal history of nicotine dependence: Secondary | ICD-10-CM | POA: Insufficient documentation

## 2012-11-08 DIAGNOSIS — L0293 Carbuncle, unspecified: Secondary | ICD-10-CM

## 2012-11-08 DIAGNOSIS — F17200 Nicotine dependence, unspecified, uncomplicated: Secondary | ICD-10-CM

## 2012-11-08 DIAGNOSIS — K219 Gastro-esophageal reflux disease without esophagitis: Secondary | ICD-10-CM

## 2012-11-08 LAB — CBC
HCT: 39.9 % (ref 36.0–46.0)
Hemoglobin: 13.6 g/dL (ref 12.0–15.0)
MCH: 31.9 pg (ref 26.0–34.0)
MCHC: 34.1 g/dL (ref 30.0–36.0)
MCV: 93.7 fL (ref 78.0–100.0)

## 2012-11-08 MED ORDER — LORATADINE 10 MG PO TABS: 10.0000 mg | ORAL_TABLET | Freq: Every day | ORAL | Status: DC

## 2012-11-08 MED ORDER — OMEPRAZOLE 20 MG PO CPDR: 20.0000 mg | DELAYED_RELEASE_CAPSULE | Freq: Every day | ORAL | Status: DC

## 2012-11-08 MED ORDER — VARENICLINE TARTRATE 0.5 MG X 11 & 1 MG X 42 PO MISC: ORAL | Status: DC

## 2012-11-08 MED ORDER — FLUTICASONE PROPIONATE 50 MCG/ACT NA SUSP: 2.0000 | Freq: Every day | NASAL | Status: DC

## 2012-11-08 MED ORDER — AMOXICILLIN 875 MG PO TABS: 875.0000 mg | ORAL_TABLET | Freq: Two times a day (BID) | ORAL | Status: DC

## 2012-11-08 NOTE — Progress Notes (Signed)
Patient ID: Janice Adams, female   DOB: 17-Apr-1956, 57 y.o.   MRN: [id number]  CC: establish  HPI: Pt reports that she has symptoms of sore throat.  Pt says that she has been having severe acid reflux for a long time and not able to take the medication.    No Known Allergies History reviewed. No pertinent past medical history. Current Outpatient Prescriptions on File Prior to Visit  Medication Sig Dispense Refill  . chlorpheniramine-HYDROcodone (TUSSIONEX PENNKINETIC ER) 10-8 MG/5ML LQCR Take 5 mLs by mouth every 12 (twelve) hours as needed.  140 mL  0   No current facility-administered medications on file prior to visit.   History reviewed. No pertinent family history. History   Social History  . Marital Status: Divorced    Spouse Name: N/A    Number of Children: N/A  . Years of Education: N/A   Occupational History  . Not on file.   Social History Main Topics  . Smoking status: Current Every Day Smoker  . Smokeless tobacco: Not on file  . Alcohol Use: No  . Drug Use: No  . Sexually Active:    Other Topics Concern  . Not on file   Social History Narrative  . No narrative on file    Review of Systems  Constitutional: Negative for fever, chills, diaphoresis, activity change, appetite change and fatigue.  HENT: Negative for ear pain, nosebleeds, congestion, facial swelling, rhinorrhea, neck pain, neck stiffness and ear discharge.   Eyes: Negative for pain, discharge, redness, itching and visual disturbance.  Respiratory: Negative for cough, choking, chest tightness, shortness of breath, wheezing and stridor.   Cardiovascular: Negative for chest pain, palpitations and leg swelling.  Gastrointestinal: Negative for abdominal distention. severe acid reflux Genitourinary: Negative for dysuria, urgency, frequency, hematuria, flank pain, decreased urine volume, difficulty urinating and dyspareunia.  Musculoskeletal: Negative for back pain, joint swelling, arthralgias and gait  problem.  Neurological: Negative for dizziness, tremors, seizures, syncope, facial asymmetry, speech difficulty, weakness, light-headedness, numbness and headaches.  Hematological: Negative for adenopathy. Does not bruise/bleed easily.  Psychiatric/Behavioral: Negative for hallucinations, behavioral problems, confusion, dysphoric mood, decreased concentration and agitation.    Objective:   Filed Vitals:   11/08/12 1137  BP: 108/72  Pulse: 91  Temp: 98.6 F (37 C)  Resp: 18    Physical Exam  Constitutional: Appears well-developed and well-nourished. No distress.  HENT: Normocephalic. External right and left ear normal. Oropharynx is clear and moist.  Eyes: Conjunctivae and EOM are normal. PERRLA, no scleral icterus.  Neck: Normal ROM. Neck supple. No JVD. No tracheal deviation. No thyromegaly.  CVS: RRR, S1/S2 +, no murmurs, no gallops, no carotid bruit.  Pulmonary: Effort and breath sounds normal, no stridor, rhonchi, wheezes, rales.  Abdominal: Soft. BS +,  no distension, tenderness, rebound or guarding.  Musculoskeletal: Normal range of motion. No edema and no tenderness.  Lymphadenopathy: No lymphadenopathy noted, cervical, inguinal. Neuro: Alert. Normal reflexes, muscle tone coordination. No cranial nerve deficit. Skin: Skin is warm and dry. No rash noted. Not diaphoretic. No erythema. No pallor.  Psychiatric: Normal mood and affect. Behavior, judgment, thought content normal.   Lab Results  Component Value Date   WBC 5.4 05/05/2011   HGB 15.0 05/05/2011   HCT 44.0 05/05/2011   MCV 95.6 05/05/2011   PLT 245 05/05/2011   Lab Results  Component Value Date   CREATININE 0.90 05/05/2011   BUN 5* 05/05/2011   NA 141 05/05/2011   K 4.1 05/05/2011  CL 103 05/05/2011    No results found for this basename: HGBA1C   Lipid Panel  No results found for this basename: chol, trig, hdl, cholhdl, vldl, ldlcalc     Assessment and plan:   Pharyngitis Tobacco use  GERD -  severe History of anemia Acute Sinusitis  GERD (gastroesophageal reflux disease) - Plan: CBC, COMPLETE METABOLIC PANEL WITH GFR, Hemoglobin A1c, Lipid panel, TSH, Vitamin D 25 hydroxy, Ambulatory referral to Gastroenterology  Smoker - Plan: CBC, COMPLETE METABOLIC PANEL WITH GFR, Hemoglobin A1c, Lipid panel, TSH, Vitamin D 25 hydroxy, Ambulatory referral to Gastroenterology  Recurrent boils - Plan: CBC, COMPLETE METABOLIC PANEL WITH GFR, Hemoglobin A1c, Lipid panel, TSH, Vitamin D 25 hydroxy, Ambulatory referral to Gastroenterology  Acute pharyngitis - Plan: CBC, COMPLETE METABOLIC PANEL WITH GFR, Hemoglobin A1c, Lipid panel, TSH, Vitamin D 25 hydroxy, Ambulatory referral to Gastroenterology  Sinusitis - Plan: CBC, COMPLETE METABOLIC PANEL WITH GFR, Hemoglobin A1c, Lipid panel, TSH, Vitamin D 25 hydroxy, Ambulatory referral to Gastroenterology  Amoxicillin 875 mg po BID  Obtain medical records from previous physician  The patient was counseled on the dangers of tobacco use, and was advised to quit.  Reviewed strategies to maximize success, including removing cigarettes and smoking materials from environment and stress management.  Mammogram done this year, within normal limits  Refer for colon cancer screening  Check labs today  Follow up in 2 months  The patient was given clear instructions to go to ER or return to medical center if symptoms don't improve, worsen or new problems develop.  The patient verbalized understanding.  The patient was told to call to get any lab results if not heard anything in the next week.    Rodney Langton, MD, CDE, FAAFP [facility] [place], [place]

## 2012-11-08 NOTE — Progress Notes (Signed)
Patient here for sore throat Blood from left ear Upset stomach- acid reflux

## 2012-11-08 NOTE — Patient Instructions (Addendum)
Heartburn Heartburn is a painful, burning feeling in the chest. It may feel worse when you lie down or bend over. Heartburn is caused by stomach acid moving into the tube that carries food from the mouth to the stomach (esophagus). HOME CARE  Take all medicine as told by your doctor.  Raise the head of your bed with blocks only as told by your doctor.  Do not exercise right after eating.  Avoid eating 2 or 3 hours before bed. Do not lie down right after eating.  Eat small meals throughout the day instead of 3 large meals.  Stop smoking if you smoke.  Keep up a healthy weight.  Avoid foods that give you heartburn. Foods you may want to avoid include:  Peppers.  Chocolate.  High-fat foods, including fried foods.  Spicy foods.  Garlic and onions.  Citrus fruits, including oranges, grapefruit, lemons, and limes.  Food containing tomatoes or tomato products.  Mint.  Bubbly (carbonated) drinks and drinks with caffeine.  Vinegar. GET HELP RIGHT AWAY IF:  You have bad chest pain that goes down your arm or into your jaw or neck.  You feel sweaty, dizzy, or lightheaded.  You have trouble breathing.  You throw up (vomit) blood.  You have trouble or pain when swallowing.  You have bloody or black poop (stool).  You have heartburn more than 3 times a week, for more than 2 weeks. MAKE SURE YOU:  Understand these instructions.  Will watch your condition.  Will get help right away if you are not doing well or get worse. Document Released: 01/05/2011 Document Revised: 07/18/2011 Document Reviewed: 01/05/2011 [facility] Patient Information 2014 [place], [place]. Allergic Rhinitis Allergic rhinitis is when the mucous membranes in the nose respond to allergens. Allergens are particles in the air that cause your body to have an allergic reaction. This causes you to release allergic antibodies. Through a chain of events, these eventually cause you to release histamine into the  blood stream (hence the use of antihistamines). Although meant to be protective to the body, it is this release that causes your discomfort, such as frequent sneezing, congestion and an itchy runny nose.  CAUSES  The pollen allergens may come from grasses, trees, and weeds. This is seasonal allergic rhinitis, or "hay fever." Other allergens cause year-round allergic rhinitis (perennial allergic rhinitis) such as house dust mite allergen, pet dander and mold spores.  SYMPTOMS   Nasal stuffiness (congestion).  Runny, itchy nose with sneezing and tearing of the eyes.  There is often an itching of the mouth, eyes and ears. It cannot be cured, but it can be controlled with medications. DIAGNOSIS  If you are unable to determine the offending allergen, skin or blood testing may find it. TREATMENT   Avoid the allergen.  Medications and allergy shots (immunotherapy) can help.  Hay fever may often be treated with antihistamines in pill or nasal spray forms. Antihistamines block the effects of histamine. There are over-the-counter medicines that may help with nasal congestion and swelling around the eyes. Check with your caregiver before taking or giving this medicine. If the treatment above does not work, there are many new medications your caregiver can prescribe. Stronger medications may be used if initial measures are ineffective. Desensitizing injections can be used if medications and avoidance fails. Desensitization is when a patient is given ongoing shots until the body becomes less sensitive to the allergen. Make sure you follow up with your caregiver if problems continue. SEEK MEDICAL CARE IF:  You develop fever (more than 100.5 F (38.1 C).  You develop a cough that does not stop easily (persistent).  You have shortness of breath.  You start wheezing.  Symptoms interfere with normal daily activities. Document Released: 01/18/2001 Document Revised: 07/18/2011 Document Reviewed:  07/30/2008 [facility] Patient Information 2014 [place], [place]. Sinusitis Sinusitis is redness, soreness, and swelling (inflammation) of the paranasal sinuses. Paranasal sinuses are air pockets within the bones of your face (beneath the eyes, the middle of the forehead, or above the eyes). In healthy paranasal sinuses, mucus is able to drain out, and air is able to circulate through them by way of your nose. However, when your paranasal sinuses are inflamed, mucus and air can become trapped. This can allow bacteria and other germs to grow and cause infection. Sinusitis can develop quickly and last only a short time (acute) or continue over a long period (chronic). Sinusitis that lasts for more than 12 weeks is considered chronic.  CAUSES  Causes of sinusitis include:  Allergies.  Structural abnormalities, such as displacement of the cartilage that separates your nostrils (deviated septum), which can decrease the air flow through your nose and sinuses and affect sinus drainage.  Functional abnormalities, such as when the small hairs (cilia) that line your sinuses and help remove mucus do not work properly or are not present. SYMPTOMS  Symptoms of acute and chronic sinusitis are the same. The primary symptoms are pain and pressure around the affected sinuses. Other symptoms include:  Upper toothache.  Earache.  Headache.  Bad breath.  Decreased sense of smell and taste.  A cough, which worsens when you are lying flat.  Fatigue.  Fever.  Thick drainage from your nose, which often is green and may contain pus (purulent).  Swelling and warmth over the affected sinuses. DIAGNOSIS  Your caregiver will perform a physical exam. During the exam, your caregiver may:  Look in your nose for signs of abnormal growths in your nostrils (nasal polyps).  Tap over the affected sinus to check for signs of infection.  View the inside of your sinuses (endoscopy) with a special imaging device with a  light attached (endoscope), which is inserted into your sinuses. If your caregiver suspects that you have chronic sinusitis, one or more of the following tests may be recommended:  Allergy tests.  Nasal culture A sample of mucus is taken from your nose and sent to a lab and screened for bacteria.  Nasal cytology A sample of mucus is taken from your nose and examined by your caregiver to determine if your sinusitis is related to an allergy. TREATMENT  Most cases of acute sinusitis are related to a viral infection and will resolve on their own within 10 days. Sometimes medicines are prescribed to help relieve symptoms (pain medicine, decongestants, nasal steroid sprays, or saline sprays).  However, for sinusitis related to a bacterial infection, your caregiver will prescribe antibiotic medicines. These are medicines that will help kill the bacteria causing the infection.  Rarely, sinusitis is caused by a fungal infection. In theses cases, your caregiver will prescribe antifungal medicine. For some cases of chronic sinusitis, surgery is needed. Generally, these are cases in which sinusitis recurs more than 3 times per year, despite other treatments. HOME CARE INSTRUCTIONS   Drink plenty of water. Water helps thin the mucus so your sinuses can drain more easily.  Use a humidifier.  Inhale steam 3 to 4 times a day (for example, sit in the bathroom with the shower running).  Apply a warm, moist washcloth to your face 3 to 4 times a day, or as directed by your caregiver.  Use saline nasal sprays to help moisten and clean your sinuses.  Take over-the-counter or prescription medicines for pain, discomfort, or fever only as directed by your caregiver. SEEK IMMEDIATE MEDICAL CARE IF:  You have increasing pain or severe headaches.  You have nausea, vomiting, or drowsiness.  You have swelling around your face.  You have vision problems.  You have a stiff neck.  You have difficulty  breathing. MAKE SURE YOU:   Understand these instructions.  Will watch your condition.  Will get help right away if you are not doing well or get worse. Document Released: 04/25/2005 Document Revised: 07/18/2011 Document Reviewed: 05/10/2011 [facility] Patient Information 2014 [place], [place]. Smoking Cessation, Tips for Success YOU CAN QUIT SMOKING If you are ready to quit smoking, congratulations! You have chosen to help yourself be healthier. Cigarettes bring nicotine, tar, carbon monoxide, and other irritants into your body. Your lungs, heart, and blood vessels will be able to work better without these poisons. There are many different ways to quit smoking. Nicotine gum, nicotine patches, a nicotine inhaler, or nicotine nasal spray can help with physical craving. Hypnosis, support groups, and medicines help break the habit of smoking. Here are some tips to help you quit for good.  Throw away all cigarettes.  Clean and remove all ashtrays from your home, work, and car.  On a card, write down your reasons for quitting. Carry the card with you and read it when you get the urge to smoke.  Cleanse your body of nicotine. Drink enough water and fluids to keep your urine clear or pale yellow. Do this after quitting to flush the nicotine from your body.  Learn to predict your moods. Do not let a bad situation be your excuse to have a cigarette. Some situations in your life might tempt you into wanting a cigarette.  Never have "just one" cigarette. It leads to wanting another and another. Remind yourself of your decision to quit.  Change habits associated with smoking. If you smoked while driving or when feeling stressed, try other activities to replace smoking. Stand up when drinking your coffee. Brush your teeth after eating. Sit in a different chair when you read the paper. Avoid alcohol while trying to quit, and try to drink fewer caffeinated beverages. Alcohol and caffeine may urge you to  smoke.  Avoid foods and drinks that can trigger a desire to smoke, such as sugary or spicy foods and alcohol.  Ask people who smoke not to smoke around you.  Have something planned to do right after eating or having a cup of coffee. Take a walk or exercise to perk you up. This will help to keep you from overeating.  Try a relaxation exercise to calm you down and decrease your stress. Remember, you may be tense and nervous for the first 2 weeks after you quit, but this will pass.  Find new activities to keep your hands busy. Play with a pen, coin, or rubber band. Doodle or draw things on paper.  Brush your teeth right after eating. This will help cut down on the craving for the taste of tobacco after meals. You can try mouthwash, too.  Use oral substitutes, such as lemon drops, carrots, a cinnamon stick, or chewing gum, in place of cigarettes. Keep them handy so they are available when you have the urge to smoke.  When you  have the urge to smoke, try deep breathing.  Designate your home as a nonsmoking area.  If you are a heavy smoker, ask your caregiver about a prescription for nicotine chewing gum. It can ease your withdrawal from nicotine.  Reward yourself. Set aside the cigarette money you save and buy yourself something nice.  Look for support from others. Join a support group or smoking cessation program. Ask someone at home or at work to help you with your plan to quit smoking.  Always ask yourself, "Do I need this cigarette or is this just a reflex?" Tell yourself, "Today, I choose not to smoke," or "I do not want to smoke." You are reminding yourself of your decision to quit, even if you do smoke a cigarette. HOW WILL I FEEL WHEN I QUIT SMOKING?  The benefits of not smoking start within days of quitting.  You may have symptoms of withdrawal because your body is used to nicotine (the addictive substance in cigarettes). You may crave cigarettes, be irritable, feel very hungry,  cough often, get headaches, or have difficulty concentrating.  The withdrawal symptoms are only temporary. They are strongest when you first quit but will go away within 10 to 14 days.  When withdrawal symptoms occur, stay in control. Think about your reasons for quitting. Remind yourself that these are signs that your body is healing and getting used to being without cigarettes.  Remember that withdrawal symptoms are easier to treat than the major diseases that smoking can cause.  Even after the withdrawal is over, expect periodic urges to smoke. However, these cravings are generally short-lived and will go away whether you smoke or not. Do not smoke!  If you relapse and smoke again, do not lose hope. Most smokers quit 3 times before they are successful.  If you relapse, do not give up! Plan ahead and think about what you will do the next time you get the urge to smoke. LIFE AS A NONSMOKER: MAKE IT FOR A MONTH, MAKE IT FOR LIFE Day 1: Hang this page where you will see it every day. Day 2: Get rid of all ashtrays, matches, and lighters. Day 3: Drink water. Breathe deeply between sips. Day 4: Avoid places with smoke-filled air, such as bars, clubs, or the smoking section of restaurants. Day 5: Keep track of how much money you save by not smoking. Day 6: Avoid boredom. Keep a good book with you or go to the movies. Day 7: Reward yourself! One week without smoking! Day 8: Make a dental appointment to get your teeth cleaned. Day 9: Decide how you will turn down a cigarette before it is offered to you. Day 10: Review your reasons for quitting. Day 11: Distract yourself. Stay active to keep your mind off smoking and to relieve tension. Take a walk, exercise, read a book, do a crossword puzzle, or try a new hobby. Day 12: Exercise. Get off the bus before your stop or use stairs instead of escalators. Day 13: Call on friends for support and encouragement. Day 14: Reward yourself! Two weeks without  smoking! Day 15: Practice deep breathing exercises. Day 16: Bet a friend that you can stay a nonsmoker. Day 17: Ask to sit in nonsmoking sections of restaurants. Day 18: Hang up "No Smoking" signs. Day 19: Think of yourself as a nonsmoker. Day 20: Each morning, tell yourself you will not smoke. Day 21: Reward yourself! Three weeks without smoking! Day 22: Think of smoking in negative ways. Remember  how it stains your teeth, gives you bad breath, and leaves you short of breath. Day 23: Eat a nutritious breakfast. Day 24:Do not relive your days as a smoker. Day 25: Hold a pencil in your hand when talking on the telephone. Day 26: Tell all your friends you do not smoke. Day 27: Think about how much better food tastes. Day 28: Remember, one cigarette is one too many. Day 29: Take up a hobby that will keep your hands busy. Day 30: Congratulations! One month without smoking! Give yourself a big reward. Your caregiver can direct you to community resources or hospitals for support, which may include:  Group support.  Education.  Hypnosis.  Subliminal therapy. Document Released: 01/22/2004 Document Revised: 07/18/2011 Document Reviewed: 02/09/2009 [facility] Patient Information 2014 [place], [place]. Smoking Cessation Quitting smoking is important to your health and has many advantages. However, it is not always easy to quit since nicotine is a very addictive drug. Often times, people try 3 times or more before being able to quit. This document explains the best ways for you to prepare to quit smoking. Quitting takes hard work and a lot of effort, but you can do it. ADVANTAGES OF QUITTING SMOKING  You will live longer, feel better, and live better.  Your body will feel the impact of quitting smoking almost immediately.  Within 20 minutes, blood pressure decreases. Your pulse returns to its normal level.  After 8 hours, carbon monoxide levels in the blood return to normal. Your oxygen level  increases.  After 24 hours, the chance of having a heart attack starts to decrease. Your breath, hair, and body stop smelling like smoke.  After 48 hours, damaged nerve endings begin to recover. Your sense of taste and smell improve.  After 72 hours, the body is virtually free of nicotine. Your bronchial tubes relax and breathing becomes easier.  After 2 to 12 weeks, lungs can hold more air. Exercise becomes easier and circulation improves.  The risk of having a heart attack, stroke, cancer, or lung disease is greatly reduced.  After 1 year, the risk of coronary heart disease is cut in half.  After 5 years, the risk of stroke falls to the same as a nonsmoker.  After 10 years, the risk of lung cancer is cut in half and the risk of other cancers decreases significantly.  After 15 years, the risk of coronary heart disease drops, usually to the level of a nonsmoker.  If you are pregnant, quitting smoking will improve your chances of having a healthy baby.  The people you live with, especially any children, will be healthier.  You will have extra money to spend on things other than cigarettes. QUESTIONS TO THINK ABOUT BEFORE ATTEMPTING TO QUIT You may want to talk about your answers with your caregiver.  Why do you want to quit?  If you tried to quit in the past, what helped and what did not?  What will be the most difficult situations for you after you quit? How will you plan to handle them?  Who can help you through the tough times? Your family? Friends? A caregiver?  What pleasures do you get from smoking? What ways can you still get pleasure if you quit? Here are some questions to ask your caregiver:  How can you help me to be successful at quitting?  What medicine do you think would be best for me and how should I take it?  What should I do if I need more  help?  What is smoking withdrawal like? How can I get information on withdrawal? GET READY  Set a quit  date.  Change your environment by getting rid of all cigarettes, ashtrays, matches, and lighters in your home, car, or work. Do not let people smoke in your home.  Review your past attempts to quit. Think about what worked and what did not. GET SUPPORT AND ENCOURAGEMENT You have a better chance of being successful if you have help. You can get support in many ways.  Tell your family, friends, and co-workers that you are going to quit and need their support. Ask them not to smoke around you.  Get individual, group, or telephone counseling and support. Programs are available at [employer] and health centers. Call your local health department for information about programs in your area.  Spiritual beliefs and practices may help some smokers quit.  Download a "quit meter" on your computer to keep track of quit statistics, such as how long you have gone without smoking, cigarettes not smoked, and money saved.  Get a self-help book about quitting smoking and staying off of tobacco. LEARN NEW SKILLS AND BEHAVIORS  Distract yourself from urges to smoke. Talk to someone, go for a walk, or occupy your time with a task.  Change your normal routine. Take a different route to work. Drink tea instead of coffee. Eat breakfast in a different place.  Reduce your stress. Take a hot bath, exercise, or read a book.  Plan something enjoyable to do every day. Reward yourself for not smoking.  Explore interactive web-based programs that specialize in helping you quit. GET MEDICINE AND USE IT CORRECTLY Medicines can help you stop smoking and decrease the urge to smoke. Combining medicine with the above behavioral methods and support can greatly increase your chances of successfully quitting smoking.  Nicotine replacement therapy helps deliver nicotine to your body without the negative effects and risks of smoking. Nicotine replacement therapy includes nicotine gum, lozenges, inhalers, nasal sprays, and  skin patches. Some may be available over-the-counter and others require a prescription.  Antidepressant medicine helps people abstain from smoking, but how this works is unknown. This medicine is available by prescription.  Nicotinic receptor partial agonist medicine simulates the effect of nicotine in your brain. This medicine is available by prescription. Ask your caregiver for advice about which medicines to use and how to use them based on your health history. Your caregiver will tell you what side effects to look out for if you choose to be on a medicine or therapy. Carefully read the information on the package. Do not use any other product containing nicotine while using a nicotine replacement product.  RELAPSE OR DIFFICULT SITUATIONS Most relapses occur within the first 3 months after quitting. Do not be discouraged if you start smoking again. Remember, most people try several times before finally quitting. You may have symptoms of withdrawal because your body is used to nicotine. You may crave cigarettes, be irritable, feel very hungry, cough often, get headaches, or have difficulty concentrating. The withdrawal symptoms are only temporary. They are strongest when you first quit, but they will go away within 10 14 days. To reduce the chances of relapse, try to:  Avoid drinking alcohol. Drinking lowers your chances of successfully quitting.  Reduce the amount of caffeine you consume. Once you quit smoking, the amount of caffeine in your body increases and can give you symptoms, such as a rapid heartbeat, sweating, and anxiety.  Avoid smokers  because they can make you want to smoke.  Do not let weight gain distract you. Many smokers will gain weight when they quit, usually less than 10 pounds. Eat a healthy diet and stay active. You can always lose the weight gained after you quit.  Find ways to improve your mood other than smoking. FOR MORE INFORMATION  www.smokefree.gov  Document  Released: 04/19/2001 Document Revised: 10/25/2011 Document Reviewed: 08/04/2011 [facility] Patient Information 2014 [place], [place]. Smoking and Your Digestive System Cigarette smoking causes many life-threatening diseases. These include lung cancer, other cancers, emphysema, and heart disease. About 430,000 deaths each year are directly caused by cigarette smoking. Smoking results in disease-causing changes in all parts of the body. This includes the digestive system. This can cause serious effects, since the digestive system converts foods into nutrients the body needs to live. Smoking has been shown to have harmful effects on all parts of the digestive system. It adds to common disorders, such as heartburn and peptic ulcers. It also increases the risk of Crohn's disease, and possibly gallstones. Smoking seems to affect the liver by changing the way it handles drugs and alcohol and removes them. In fact, there seems to be enough evidence to stop smoking based solely on digestive distress. Some of the harmful effects of smoking are:  Heartburn (acid reflux).  Heartburn happens when acidic juices from the stomach splash into the esophagus, which has a more sensitive and less acid-resistant lining than the stomach. Normally, a muscular valve at the lower end of the esophagus keeps out the acid solution in the stomach. Smoking decreases the strength of the esophageal valve and its ability to keep out acidic stomach contents. This allows stomach acid reflux, or flow backward into the esophagus.  Smoking also seems to promote the movement of bile salts from the intestine to the stomach. This makes stomach acids more harmful.  A peptic ulcer is an open sore in the lining of the stomach or duodenum (first part of the small intestine). The exact cause of ulcers is not known. A link between smoking cigarettes and ulcers, especially duodenal ulcers, does exist. Ulcers are more likely to occur, less likely to heal,  and more likely to cause death in smokers than in nonsmokers.  Some research suggests that smoking might increase a person's risk of infection with the bacterium Helicobacter pylori (H. pylori). Most peptic ulcers are caused by this bacterium.  Stomach acid is also important in causing ulcers. Normally, most of this acid is buffered (neutralized) by the food we eat. Most of the unbuffered acid that enters the duodenum is quickly neutralized by sodium bicarbonate. This is a naturally occurring alkali, produced by the pancreas. Some studies show that smoking reduces the bicarbonate produced by the pancreas. This interferes with the neutralization of acid in the duodenum. Other studies suggest that chronic cigarette smoking may increase the amount of acid produced by the stomach.  Whatever causes the link between smoking and ulcers, two points have been repeatedly shown. People who smoke are more likely to develop an ulcer, especially a duodenal ulcer. Ulcers in smokers are less likely to heal quickly in response to otherwise effective treatment. This research strongly suggests that a person with an ulcer should stop smoking.  The liver is an important organ with many tasks. One task of the liver is to prepare drugs, alcohol, and other toxins for elimination (removal) from the body. There is evidence that smoking alters the ability of the liver to effectively handle  such substances. In some cases, this may influence the dose of medicine needed to treat an illness. Some research suggests that smoking can aggravate and speed up the course of liver disease caused by excessive alcohol intake.  Studies have shown that smokers have weaker or less frequent stomach contractions while smoking, which can cause less efficient digestion.  Crohn's disease causes inflammation deep in the lining of the intestine. The disease causes pain and diarrhea. It usually affects the small intestine, but it can occur anywhere in the  digestive tract. Research shows that current and former smokers have a higher risk of developing Crohn's disease than nonsmokers. Among people with the disease, smoking is linked with a higher rate of relapse, repeat surgery, and immunosuppressive treatment. In all areas, the risk for women who are current or former smokers is slightly higher than for men. Why smoking increases the risk of Crohn's disease is unknown.  Several studies suggest that smoking may increase the risk of developing gallstones. The risk may be higher for women. Research results on this topic are not consistent. More studies are needed.  Oral (lip and mouth) cancer and cancer of the pharynx (throat) and the esophagus are caused by smoking. Smoking may be associated with pancreatic cancer.  Some of the effects of smoking on the digestive system seem to be short-lived. For example, the effect of smoking on bicarbonate production by the pancreas does not appear to last. Half an hour after smoking, the production of bicarbonate returns to normal. The effects of smoking on how the liver handles drugs also disappear when a person stops smoking. However, people who no longer smoke still remain at risk for Crohn's disease. Document Released: 04/07/2004 Document Revised: 07/18/2011 Document Reviewed: 02/09/2009 [facility] Patient Information 2014 [place], [place].

## 2012-11-09 LAB — LIPID PANEL
Cholesterol: 212 mg/dL — ABNORMAL HIGH (ref 0–200)
Triglycerides: 131 mg/dL (ref <150)

## 2012-11-09 LAB — COMPLETE METABOLIC PANEL WITH GFR
Alkaline Phosphatase: 98 U/L (ref 39–117)
BUN: 12 mg/dL (ref 6–23)
CO2: 28 mEq/L (ref 19–32)
Creat: 0.88 mg/dL (ref 0.50–1.10)
GFR, Est African American: 84 mL/min
GFR, Est Non African American: 73 mL/min
Glucose, Bld: 79 mg/dL (ref 70–99)
Total Bilirubin: 0.5 mg/dL (ref 0.3–1.2)

## 2012-11-09 LAB — VITAMIN D 25 HYDROXY (VIT D DEFICIENCY, FRACTURES): Vit D, 25-Hydroxy: 16 ng/mL — ABNORMAL LOW (ref 30–89)

## 2012-11-09 LAB — HEMOGLOBIN A1C: Hgb A1c MFr Bld: 5.5 % (ref <5.7)

## 2012-11-12 NOTE — Progress Notes (Signed)
Quick Note:  Please inform patient that labs came back OK except that her cholesterol levels were elevated. Also, her vitamin D level came back low. Recommend she take a short course of prescription Vit D. Please call in prescription for Drisdol Vitamin D 50,000 IU caps, take 1 po once per week, #8, no refills.    Rodney Langton, MD, CDE, FAAFP [facility] [place], [place]   ______

## 2012-11-13 ENCOUNTER — Encounter: Payer: Self-pay | Admitting: Gastroenterology

## 2012-11-14 ENCOUNTER — Telehealth: Payer: Self-pay

## 2012-11-14 NOTE — Telephone Encounter (Signed)
Message copied by Lestine Mount on Wed Nov 14, 2012 12:17 PM ------      Message from: Cleora Fleet      Created: Mon Nov 12, 2012  9:16 AM       Please inform patient that labs came back OK except that her cholesterol levels were elevated.  Also, her vitamin D level came back low.  Recommend she take a short course of prescription Vit D.  Please call in prescription for Drisdol Vitamin D 50,000 IU caps, take 1 po once per week, #8, no refills.                    Rodney Langton, MD, CDE, FAAFP      [facility]      [facility]      [place], [place]        ------

## 2012-11-14 NOTE — Telephone Encounter (Signed)
Left message with person who answered the phone to  Please have patient return our call

## 2012-11-28 ENCOUNTER — Encounter: Payer: Self-pay | Admitting: Gastroenterology

## 2012-11-28 ENCOUNTER — Ambulatory Visit (INDEPENDENT_AMBULATORY_CARE_PROVIDER_SITE_OTHER): Payer: Medicaid Other | Admitting: Gastroenterology

## 2012-11-28 VITALS — BP 126/82 | HR 72 | Ht 64.5 in | Wt 173.0 lb

## 2012-11-28 DIAGNOSIS — K219 Gastro-esophageal reflux disease without esophagitis: Secondary | ICD-10-CM

## 2012-11-28 DIAGNOSIS — R197 Diarrhea, unspecified: Secondary | ICD-10-CM

## 2012-11-28 DIAGNOSIS — R1033 Periumbilical pain: Secondary | ICD-10-CM | POA: Insufficient documentation

## 2012-11-28 MED ORDER — PANTOPRAZOLE SODIUM 40 MG PO TBEC: 40.0000 mg | DELAYED_RELEASE_TABLET | Freq: Every day | ORAL | Status: DC

## 2012-11-28 NOTE — Assessment & Plan Note (Signed)
Patient has symptomatic GERD.  Recommendations #1 protonix. 40 mg daily

## 2012-11-28 NOTE — Assessment & Plan Note (Signed)
Symptoms could be due to IBS. A structural abnormality of the colon should be ruled out.  recommendations #1 colonoscopy

## 2012-11-28 NOTE — Assessment & Plan Note (Signed)
Pain and tenderness may do to colonic tenderness. No masses are appreciated. If colonoscopy is negative and tenderness persists I would consider CT of the abdomen

## 2012-11-28 NOTE — Progress Notes (Signed)
History of Present Illness: 57 year old Afro-American female referred at the request of Dr. Laural Benes for evaluation of diarrhea and abdominal pain. For the past 3-4 years she's developed frequent loose stools. She suffers from intermittent periumbilical pain that is often followed by a loose stool. She denies rectal bleeding or melena.  Weight has been stable.  She has frequent pyrosis. She denies dysphagia.    History reviewed. No pertinent past medical history. History reviewed. No pertinent past surgical history. family history includes Breast cancer in her daughter, maternal grandmother, and mother. No current outpatient prescriptions on file.   No current facility-administered medications for this visit.   Allergies as of 11/28/2012  . (No Known Allergies)    reports that she has been smoking.  She has never used smokeless tobacco. She reports that she does not drink alcohol or use illicit drugs.     Review of Systems: Pertinent positive and negative review of systems were noted in the above HPI section. All other review of systems were otherwise negative.  Vital signs were reviewed in today's medical record Physical Exam: General: Well developed , well nourished, no acute distress Skin: anicteric Head: Normocephalic and atraumatic Eyes:  sclerae anicteric, EOMI Ears: Normal auditory acuity Mouth: No deformity or lesions Neck: Supple, no masses or thyromegaly Lungs: Clear throughout to auscultation Heart: Regular rate and rhythm; no murmurs, rubs or bruits Abdomen: Soft,  and non distended. No masses, hepatosplenomegaly or hernias noted. Normal Bowel sounds. There is moderate tenderness to palpation in the umbilical area without guarding or rebound. Rectal:deferred Musculoskeletal: Symmetrical with no gross deformities  Skin: No lesions on visible extremities Pulses:  Normal pulses noted Extremities: No clubbing, cyanosis, edema or deformities noted Neurological: Alert  oriented x 4, grossly nonfocal Cervical Nodes:  No significant cervical adenopathy Inguinal Nodes: No significant inguinal adenopathy Psychological:  Alert and cooperative. Normal mood and affect

## 2012-11-28 NOTE — Patient Instructions (Addendum)
You have been given a separate informational sheet regarding your tobacco use, the importance of quitting and local resources to help you quit.   It has been recommended to you by your physician that you have a(n) Colonoscopy completed. Per your request, we did not schedule the procedure(s) today. Please contact our office at [phone] should you decide to have the procedure completed.

## 2013-01-16 ENCOUNTER — Ambulatory Visit: Payer: Medicaid Other | Admitting: Internal Medicine

## 2013-08-13 ENCOUNTER — Other Ambulatory Visit: Payer: Self-pay

## 2013-08-13 DIAGNOSIS — Z1231 Encounter for screening mammogram for malignant neoplasm of breast: Secondary | ICD-10-CM

## 2013-08-26 ENCOUNTER — Ambulatory Visit
Admission: RE | Admit: 2013-08-26 | Discharge: 2013-08-26 | Disposition: A | Discharge status: Home / Self Care | Payer: Medicaid Other | Source: Ambulatory Visit

## 2013-08-26 DIAGNOSIS — Z1231 Encounter for screening mammogram for malignant neoplasm of breast: Secondary | ICD-10-CM

## 2014-03-24 ENCOUNTER — Encounter: Payer: Medicaid Other | Admitting: Family Medicine

## 2014-05-16 ENCOUNTER — Encounter (HOSPITAL_COMMUNITY): Payer: Self-pay | Admitting: *Deleted

## 2014-05-16 ENCOUNTER — Emergency Department (HOSPITAL_COMMUNITY): Payer: Medicaid Other

## 2014-05-16 ENCOUNTER — Emergency Department (HOSPITAL_COMMUNITY)
Admission: EM | Admit: 2014-05-16 | Discharge: 2014-05-16 | Disposition: A | Discharge status: Home / Self Care | Payer: Medicaid Other | Attending: Emergency Medicine | Admitting: Emergency Medicine

## 2014-05-16 DIAGNOSIS — M791 Myalgia: Secondary | ICD-10-CM | POA: Insufficient documentation

## 2014-05-16 DIAGNOSIS — R05 Cough: Secondary | ICD-10-CM

## 2014-05-16 DIAGNOSIS — Z720 Tobacco use: Secondary | ICD-10-CM | POA: Diagnosis not present

## 2014-05-16 DIAGNOSIS — Z79899 Other long term (current) drug therapy: Secondary | ICD-10-CM | POA: Diagnosis not present

## 2014-05-16 DIAGNOSIS — R0602 Shortness of breath: Secondary | ICD-10-CM

## 2014-05-16 DIAGNOSIS — R519 Headache, unspecified: Secondary | ICD-10-CM

## 2014-05-16 DIAGNOSIS — R110 Nausea: Secondary | ICD-10-CM | POA: Diagnosis not present

## 2014-05-16 DIAGNOSIS — R51 Headache: Secondary | ICD-10-CM | POA: Insufficient documentation

## 2014-05-16 DIAGNOSIS — R059 Cough, unspecified: Secondary | ICD-10-CM

## 2014-05-16 LAB — CBC WITH DIFFERENTIAL/PLATELET
BASOS ABS: 0 10*3/uL (ref 0.0–0.1)
BASOS PCT: 0 % (ref 0–1)
EOS ABS: 0 10*3/uL (ref 0.0–0.7)
EOS PCT: 1 % (ref 0–5)
HEMATOCRIT: 38.8 % (ref 36.0–46.0)
HEMOGLOBIN: 13.2 g/dL (ref 12.0–15.0)
LYMPHS ABS: 2.1 10*3/uL (ref 0.7–4.0)
Lymphocytes Relative: 36 % (ref 12–46)
MCH: 32.2 pg (ref 26.0–34.0)
MCHC: 34 g/dL (ref 30.0–36.0)
MCV: 94.6 fL (ref 78.0–100.0)
MONOS PCT: 11 % (ref 3–12)
Monocytes Absolute: 0.6 10*3/uL (ref 0.1–1.0)
NEUTROS ABS: 3.1 10*3/uL (ref 1.7–7.7)
NEUTROS PCT: 52 % (ref 43–77)
PLATELETS: 369 10*3/uL (ref 150–400)
RBC: 4.1 MIL/uL (ref 3.87–5.11)
RDW: 12.8 % (ref 11.5–15.5)
WBC: 5.8 10*3/uL (ref 4.0–10.5)

## 2014-05-16 LAB — BASIC METABOLIC PANEL
ANION GAP: 7 (ref 5–15)
BUN: 7 mg/dL (ref 6–23)
CALCIUM: 9.8 mg/dL (ref 8.4–10.5)
CO2: 28 mmol/L (ref 19–32)
CREATININE: 0.84 mg/dL (ref 0.50–1.10)
Chloride: 105 mEq/L (ref 96–112)
GFR calc Af Amer: 86 mL/min — ABNORMAL LOW (ref >90)
GFR calc non Af Amer: 75 mL/min — ABNORMAL LOW (ref >90)
Glucose, Bld: 92 mg/dL (ref 70–99)
POTASSIUM: 3.5 mmol/L (ref 3.5–5.1)
Sodium: 140 mmol/L (ref 135–145)

## 2014-05-16 IMAGING — DX DG CHEST 2V
2 series · 2 of 2 positions shown · non-contrast
Comparison: [DATE] and earlier.

CLINICAL DATA: 58-year-old female with shortness of breath and
cough for 5 days. Initial encounter.

EXAM:
CHEST  2 VIEW

[chest pa]
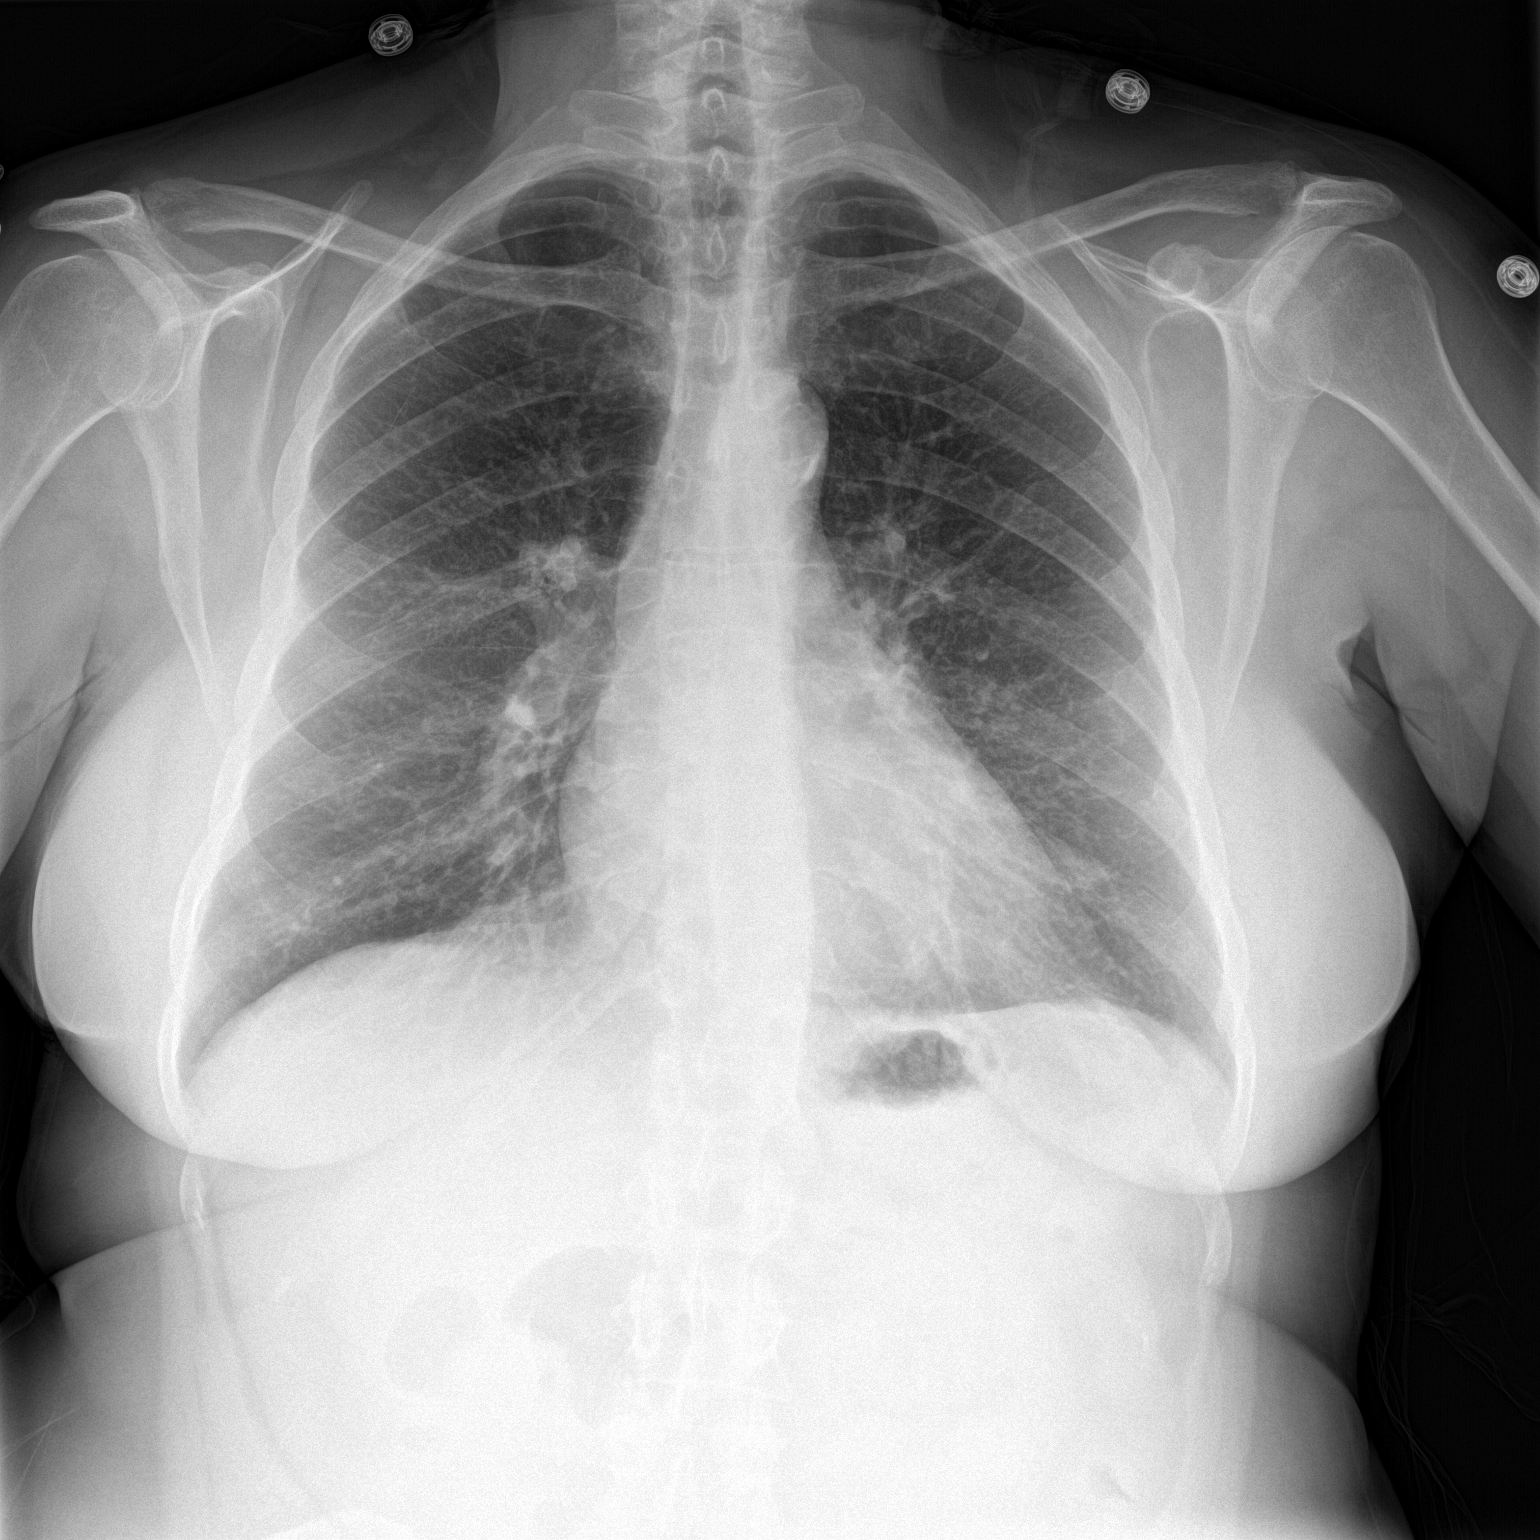

[chest lat]
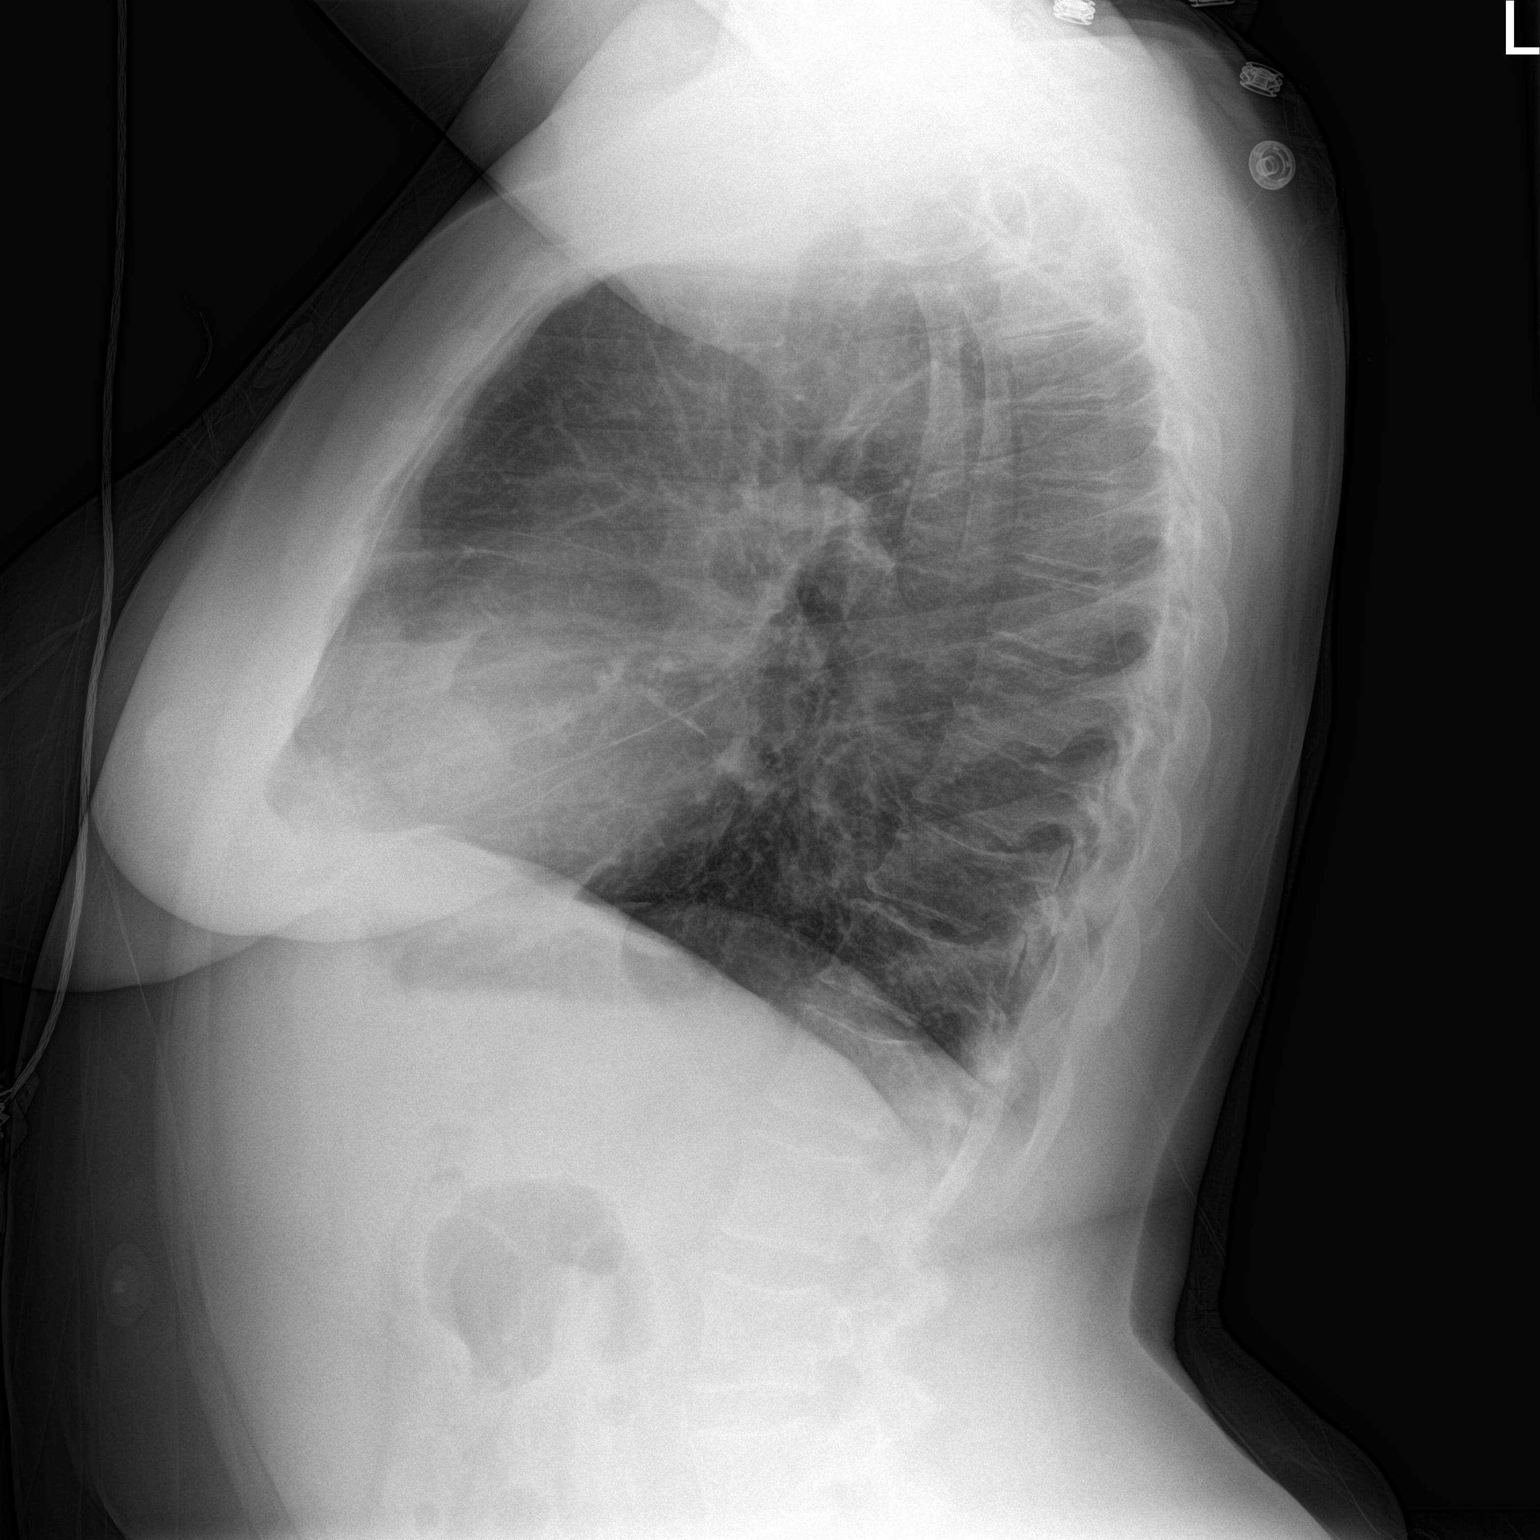

[2 of 2 positions shown; findings below may reference images not displayed]

FINDINGS: Lung volumes are within normal limits. Normal cardiac size and
mediastinal contours. Visualized tracheal air column is within
normal limits. No pneumothorax, pulmonary edema, pleural effusion or
consolidation. Increased interstitial markings bilaterally. No
confluent pulmonary opacity. No acute osseous abnormality
identified.
IMPRESSION: Bilateral increased pulmonary interstitial markings suspicious for
viral/atypical respiratory infection in this setting.

## 2014-05-16 MED ORDER — AZITHROMYCIN 250 MG PO TABS: ORAL_TABLET | ORAL | Status: DC

## 2014-05-16 MED ORDER — HYDROCOD POLST-CHLORPHEN POLST 10-8 MG/5ML PO LQCR: 5.0000 mL | Freq: Two times a day (BID) | ORAL | Status: DC | PRN

## 2014-05-16 MED ORDER — ACETAMINOPHEN 325 MG PO TABS
650.0000 mg | ORAL_TABLET | Freq: Once | ORAL | Status: AC
  Administered 2014-05-16: 650 mg via ORAL
  Filled 2014-05-16: qty 2

## 2014-05-16 NOTE — ED Provider Notes (Signed)
CSN: [id number]     Arrival date & time 05/16/14  1017 History   First MD Initiated Contact with Patient 05/16/14 1027     Chief Complaint  Patient presents with  . Generalized Body Aches  . Headache  . Nausea  . URI     (Consider location/radiation/quality/duration/timing/severity/associated sxs/prior Treatment) Patient is a 59 y.o. female presenting with headaches and URI. The history is provided by the patient.  Headache Pain location:  Occipital Quality:  Dull Radiates to:  Does not radiate Severity currently:  0/10 Pain scale at highest: mild. Onset quality:  Gradual Duration:  5 days Timing:  Intermittent Progression:  Unchanged Chronicity:  New Associated symptoms: cough, myalgias and URI   Associated symptoms: no abdominal pain, no back pain, no congestion, no diarrhea, no dizziness, no pain, no fatigue, no fever, no nausea, no neck pain and no vomiting   Cough:    Cough characteristics:  Productive   Sputum characteristics:  Nondescript   Severity:  Moderate   Onset quality:  Gradual   Duration:  5 days   Timing:  Constant   Progression:  Unchanged   Chronicity:  New Myalgias:    Location:  Generalized URI Presenting symptoms: cough   Presenting symptoms: no congestion, no fatigue and no fever   Associated symptoms: myalgias   Associated symptoms: no headaches and no neck pain     History reviewed. No pertinent past medical history. History reviewed. No pertinent past surgical history. Family History  Problem Relation Age of Onset  . Breast cancer Mother   . Breast cancer Maternal Grandmother   . Breast cancer Daughter    History  Substance Use Topics  . Smoking status: Current Every Day Smoker  . Smokeless tobacco: Never Used  . Alcohol Use: No   OB History    No data available     Review of Systems  Constitutional: Negative for fever and fatigue.  HENT: Negative for congestion and drooling.   Eyes: Negative for pain.  Respiratory: Positive  for cough. Negative for shortness of breath.   Cardiovascular: Negative for chest pain.  Gastrointestinal: Negative for nausea, vomiting, abdominal pain and diarrhea.  Genitourinary: Negative for dysuria and hematuria.  Musculoskeletal: Positive for myalgias. Negative for back pain, gait problem and neck pain.  Skin: Negative for color change.  Neurological: Negative for dizziness and headaches.  Hematological: Negative for adenopathy.  Psychiatric/Behavioral: Negative for behavioral problems.  All other systems reviewed and are negative.     Allergies  Review of patient's allergies indicates no known allergies.  Home Medications   Prior to Admission medications   Medication Sig Start Date End Date Taking? Authorizing Provider  pantoprazole (PROTONIX) 40 MG tablet Take 1 tablet (40 mg total) by mouth daily. 11/28/12   Inda Castle, MD   BP 129/84 mmHg  Pulse 92  Temp(Src) 97.9 F (36.6 C) (Oral)  Resp 14  SpO2 91% Physical Exam  Constitutional: She is oriented to person, place, and time. She appears well-developed and well-nourished.  HENT:  Head: Normocephalic.  Mouth/Throat: Oropharynx is clear and moist. No oropharyngeal exudate.  Eyes: Conjunctivae and EOM are normal. Pupils are equal, round, and reactive to light.  Neck: Normal range of motion. Neck supple.  Cardiovascular: Normal rate, regular rhythm, normal heart sounds and intact distal pulses.  Exam reveals no gallop and no friction rub.   No murmur heard. Pulmonary/Chest: Effort normal and breath sounds normal. No respiratory distress. She has no wheezes.  Abdominal:  Soft. Bowel sounds are normal. There is no tenderness. There is no rebound and no guarding.  Musculoskeletal: Normal range of motion. She exhibits no edema or tenderness.  Neurological: She is alert and oriented to person, place, and time.  alert, oriented x3 speech: normal in context and clarity memory: intact grossly cranial nerves II-XII:  intact motor strength: full proximally and distally no involuntary movements or tremors sensation: intact to light touch diffusely  cerebellar: finger-to-nose and heel-to-shin intact gait: normal  Skin: Skin is warm and dry.  Psychiatric: She has a normal mood and affect. Her behavior is normal.  Nursing note and vitals reviewed.   ED Course  Procedures (including critical care time) Labs Review Labs Reviewed  BASIC METABOLIC PANEL - Abnormal; Notable for the following:    GFR calc non Af Amer 75 (*)    GFR calc Af Amer 86 (*)    All other components within normal limits  CBC WITH DIFFERENTIAL    Imaging Review Dg Chest 2 View  05/16/2014   CLINICAL DATA:  59 year old female with shortness of breath and cough for 5 days. Initial encounter.  EXAM: CHEST  2 VIEW  COMPARISON:  10/15/2012 and earlier.  FINDINGS: Lung volumes are within normal limits. Normal cardiac size and mediastinal contours. Visualized tracheal air column is within normal limits. No pneumothorax, pulmonary edema, pleural effusion or consolidation. Increased interstitial markings bilaterally. No confluent pulmonary opacity. No acute osseous abnormality identified.  IMPRESSION: Bilateral increased pulmonary interstitial markings suspicious for viral/atypical respiratory infection in this setting.   Electronically Signed   By: Lars Pinks M.D.   On: 05/16/2014 11:56     EKG Interpretation None      MDM   Final diagnoses:  Cough  Headache, unspecified headache type    11:11 AM 59 y.o. female who presents with generalized body aches, intermittent occipital headache, and cough productive of sputum for the last 5 days. She denies any documented fevers. She has had some posttussive emesis. She denies any chest pain or shortness of breath. She is afebrile and vital signs are unremarkable here. Initially denied a headache but then stated that she felt a mild left frontal headache coming on during exam. Will treat with  Tylenol.  1:12 PM: Labs non-contrib. CXR c/w viral/atypical infection. Will cover w/ azithro. I have discussed the diagnosis/risks/treatment options with the patient and believe the pt to be eligible for discharge home to follow-up with her pcp as needed. We also discussed returning to the ED immediately if new or worsening sx occur. We discussed the sx which are most concerning (e.g., sob, fever, worsening cough) that necessitate immediate return. Medications administered to the patient during their visit and any new prescriptions provided to the patient are listed below.  Medications given during this visit Medications  acetaminophen (TYLENOL) tablet 650 mg (650 mg Oral Given 05/16/14 1120)    New Prescriptions   AZITHROMYCIN (ZITHROMAX Z-PAK) 250 MG TABLET    2 po day one, then 1 daily x 4 days   CHLORPHENIRAMINE-HYDROCODONE (TUSSIONEX PENNKINETIC ER) 10-8 MG/5ML LQCR    Take 5 mLs by mouth every 12 (twelve) hours as needed for cough.     Pamella Pert, MD 05/16/14 [id number]

## 2014-05-16 NOTE — ED Notes (Signed)
Patient has had uri sx with body aches and headaches. She also has had post tussis emesis.  Patient has tried otc meds w/o relief.  Patient has not had any meds today

## 2014-06-06 ENCOUNTER — Encounter: Payer: Self-pay | Admitting: Internal Medicine

## 2014-06-06 ENCOUNTER — Ambulatory Visit: Payer: Medicaid Other | Attending: Internal Medicine | Admitting: Internal Medicine

## 2014-06-06 VITALS — BP 142/90 | HR 84 | Temp 98.1°F | Resp 20 | Ht 64.0 in | Wt 188.0 lb

## 2014-06-06 DIAGNOSIS — K219 Gastro-esophageal reflux disease without esophagitis: Secondary | ICD-10-CM

## 2014-06-06 DIAGNOSIS — F17200 Nicotine dependence, unspecified, uncomplicated: Secondary | ICD-10-CM

## 2014-06-06 DIAGNOSIS — Z0000 Encounter for general adult medical examination without abnormal findings: Secondary | ICD-10-CM | POA: Diagnosis not present

## 2014-06-06 DIAGNOSIS — L905 Scar conditions and fibrosis of skin: Secondary | ICD-10-CM

## 2014-06-06 DIAGNOSIS — L84 Corns and callosities: Secondary | ICD-10-CM

## 2014-06-06 DIAGNOSIS — F17210 Nicotine dependence, cigarettes, uncomplicated: Secondary | ICD-10-CM | POA: Insufficient documentation

## 2014-06-06 DIAGNOSIS — Z789 Other specified health status: Secondary | ICD-10-CM

## 2014-06-06 DIAGNOSIS — R109 Unspecified abdominal pain: Secondary | ICD-10-CM | POA: Insufficient documentation

## 2014-06-06 DIAGNOSIS — Z720 Tobacco use: Secondary | ICD-10-CM | POA: Diagnosis not present

## 2014-06-06 DIAGNOSIS — Z2821 Immunization not carried out because of patient refusal: Secondary | ICD-10-CM

## 2014-06-06 MED ORDER — OMEPRAZOLE 20 MG PO CPDR: 20.0000 mg | DELAYED_RELEASE_CAPSULE | Freq: Two times a day (BID) | ORAL | Status: DC

## 2014-06-06 NOTE — Progress Notes (Signed)
Patient complaining of abdominal pain near umbilicus Takes protonix intermittently due to cost. Also takes tums for pain relief Begins in the morning, and she has intermittent diarrhea. Abdominal pain currently 7/10  Patient also complains of intermittent left ear pain Patient interested in a Podiatry referral for painful callous on bottom of left foot Declines flu shot

## 2014-06-06 NOTE — Patient Instructions (Signed)
Gastroesophageal Reflux Disease, Adult Gastroesophageal reflux disease (GERD) happens when acid from your stomach flows up into the esophagus. When acid comes in contact with the esophagus, the acid causes soreness (inflammation) in the esophagus. Over time, GERD may create small holes (ulcers) in the lining of the esophagus. CAUSES   Increased body weight. This puts pressure on the stomach, making acid rise from the stomach into the esophagus.  Smoking. This increases acid production in the stomach.  Drinking alcohol. This causes decreased pressure in the lower esophageal sphincter (valve or ring of muscle between the esophagus and stomach), allowing acid from the stomach into the esophagus.  Late evening meals and a full stomach. This increases pressure and acid production in the stomach.  A malformed lower esophageal sphincter. Sometimes, no cause is found. SYMPTOMS   Burning pain in the lower part of the mid-chest behind the breastbone and in the mid-stomach area. This may occur twice a week or more often.  Trouble swallowing.  Sore throat.  Dry cough.  Asthma-like symptoms including chest tightness, shortness of breath, or wheezing. DIAGNOSIS  Your caregiver may be able to diagnose GERD based on your symptoms. In some cases, X-rays and other tests may be done to check for complications or to check the condition of your stomach and esophagus. TREATMENT  Your caregiver may recommend over-the-counter or prescription medicines to help decrease acid production. Ask your caregiver before starting or adding any new medicines.  HOME CARE INSTRUCTIONS   Change the factors that you can control. Ask your caregiver for guidance concerning weight loss, quitting smoking, and alcohol consumption.  Avoid foods and drinks that make your symptoms worse, such as:  Caffeine or alcoholic drinks.  Chocolate.  Peppermint or mint flavorings.  Garlic and onions.  Spicy foods.  Citrus fruits,  such as oranges, lemons, or limes.  Tomato-based foods such as sauce, chili, salsa, and pizza.  Fried and fatty foods.  Avoid lying down for the 3 hours prior to your bedtime or prior to taking a nap.  Eat small, frequent meals instead of large meals.  Wear loose-fitting clothing. Do not wear anything tight around your waist that causes pressure on your stomach.  Raise the head of your bed 6 to 8 inches with wood blocks to help you sleep. Extra pillows will not help.  Only take over-the-counter or prescription medicines for pain, discomfort, or fever as directed by your caregiver.  Do not take aspirin, ibuprofen, or other nonsteroidal anti-inflammatory drugs (NSAIDs). SEEK IMMEDIATE MEDICAL CARE IF:   You have pain in your arms, neck, jaw, teeth, or back.  Your pain increases or changes in intensity or duration.  You develop nausea, vomiting, or sweating (diaphoresis).  You develop shortness of breath, or you faint.  Your vomit is green, yellow, black, or looks like coffee grounds or blood.  Your stool is red, bloody, or black. These symptoms could be signs of other problems, such as heart disease, gastric bleeding, or esophageal bleeding. MAKE SURE YOU:   Understand these instructions.  Will watch your condition.  Will get help right away if you are not doing well or get worse. Document Released: 02/02/2005 Document Revised: 07/18/2011 Document Reviewed: 11/12/2010 [facility] Patient Information 2015 [place], [place]. This information is not intended to replace advice given to you by your health care provider. Make sure you discuss any questions you have with your health care provider.  Smoking Cessation Quitting smoking is important to your health and has many advantages. However, it  is not always easy to quit since nicotine is a very addictive drug. Oftentimes, people try 3 times or more before being able to quit. This document explains the best ways for you to prepare to  quit smoking. Quitting takes hard work and a lot of effort, but you can do it. ADVANTAGES OF QUITTING SMOKING  You will live longer, feel better, and live better.  Your body will feel the impact of quitting smoking almost immediately.  Within 20 minutes, blood pressure decreases. Your pulse returns to its normal level.  After 8 hours, carbon monoxide levels in the blood return to normal. Your oxygen level increases.  After 24 hours, the chance of having a heart attack starts to decrease. Your breath, hair, and body stop smelling like smoke.  After 48 hours, damaged nerve endings begin to recover. Your sense of taste and smell improve.  After 72 hours, the body is virtually free of nicotine. Your bronchial tubes relax and breathing becomes easier.  After 2 to 12 weeks, lungs can hold more air. Exercise becomes easier and circulation improves.  The risk of having a heart attack, stroke, cancer, or lung disease is greatly reduced.  After 1 year, the risk of coronary heart disease is cut in half.  After 5 years, the risk of stroke falls to the same as a nonsmoker.  After 10 years, the risk of lung cancer is cut in half and the risk of other cancers decreases significantly.  After 15 years, the risk of coronary heart disease drops, usually to the level of a nonsmoker.  If you are pregnant, quitting smoking will improve your chances of having a healthy baby.  The people you live with, especially any children, will be healthier.  You will have extra money to spend on things other than cigarettes. QUESTIONS TO THINK ABOUT BEFORE ATTEMPTING TO QUIT You may want to talk about your answers with your health care provider.  Why do you want to quit?  If you tried to quit in the past, what helped and what did not?  What will be the most difficult situations for you after you quit? How will you plan to handle them?  Who can help you through the tough times? Your family? Friends? A health  care provider?  What pleasures do you get from smoking? What ways can you still get pleasure if you quit? Here are some questions to ask your health care provider:  How can you help me to be successful at quitting?  What medicine do you think would be best for me and how should I take it?  What should I do if I need more help?  What is smoking withdrawal like? How can I get information on withdrawal? GET READY  Set a quit date.  Change your environment by getting rid of all cigarettes, ashtrays, matches, and lighters in your home, car, or work. Do not let people smoke in your home.  Review your past attempts to quit. Think about what worked and what did not. GET SUPPORT AND ENCOURAGEMENT You have a better chance of being successful if you have help. You can get support in many ways.  Tell your family, friends, and coworkers that you are going to quit and need their support. Ask them not to smoke around you.  Get individual, group, or telephone counseling and support. Programs are available at [employer] and health centers. Call your local health department for information about programs in your area.  Spiritual beliefs  and practices may help some smokers quit.  Download a "quit meter" on your computer to keep track of quit statistics, such as how long you have gone without smoking, cigarettes not smoked, and money saved.  Get a self-help book about quitting smoking and staying off tobacco. [place] yourself from urges to smoke. Talk to someone, go for a walk, or occupy your time with a task.  Change your normal routine. Take a different route to work. Drink tea instead of coffee. Eat breakfast in a different place.  Reduce your stress. Take a hot bath, exercise, or read a book.  Plan something enjoyable to do every day. Reward yourself for not smoking.  Explore interactive web-based programs that specialize in helping you quit. GET  MEDICINE AND USE IT CORRECTLY Medicines can help you stop smoking and decrease the urge to smoke. Combining medicine with the above behavioral methods and support can greatly increase your chances of successfully quitting smoking.  Nicotine replacement therapy helps deliver nicotine to your body without the negative effects and risks of smoking. Nicotine replacement therapy includes nicotine gum, lozenges, inhalers, nasal sprays, and skin patches. Some may be available over-the-counter and others require a prescription.  Antidepressant medicine helps people abstain from smoking, but how this works is unknown. This medicine is available by prescription.  Nicotinic receptor partial agonist medicine simulates the effect of nicotine in your brain. This medicine is available by prescription. Ask your health care provider for advice about which medicines to use and how to use them based on your health history. Your health care provider will tell you what side effects to look out for if you choose to be on a medicine or therapy. Carefully read the information on the package. Do not use any other product containing nicotine while using a nicotine replacement product.  RELAPSE OR DIFFICULT SITUATIONS Most relapses occur within the first 3 months after quitting. Do not be discouraged if you start smoking again. Remember, most people try several times before finally quitting. You may have symptoms of withdrawal because your body is used to nicotine. You may crave cigarettes, be irritable, feel very hungry, cough often, get headaches, or have difficulty concentrating. The withdrawal symptoms are only temporary. They are strongest when you first quit, but they will go away within 10-14 days. To reduce the chances of relapse, try to:  Avoid drinking alcohol. Drinking lowers your chances of successfully quitting.  Reduce the amount of caffeine you consume. Once you quit smoking, the amount of caffeine in your body  increases and can give you symptoms, such as a rapid heartbeat, sweating, and anxiety.  Avoid smokers because they can make you want to smoke.  Do not let weight gain distract you. Many smokers will gain weight when they quit, usually less than 10 pounds. Eat a healthy diet and stay active. You can always lose the weight gained after you quit.  Find ways to improve your mood other than smoking. FOR MORE INFORMATION  www.smokefree.gov  Document Released: 04/19/2001 Document Revised: 09/09/2013 Document Reviewed: 08/04/2011 [facility] Patient Information 2015 [place], [place]. This information is not intended to replace advice given to you by your health care provider. Make sure you discuss any questions you have with your health care provider.

## 2014-06-06 NOTE — Progress Notes (Signed)
Patient ID: Janice Adams, female   DOB: 05-Jun-1955, 59 y.o.   MRN: [id number] ABDOMINAL PAIN  Pain began at least 2 years ago, rated as a 7/10 on pain scale. Mostly described as a nausea and throbbing.  Pain exacerbated by coffee.  Medications tried: Tums, protonix Similar pain before:yes Prior abdominal surgeries: none  Symptoms Nausea/vomiting: yes Diarrhea: yes intermittently. 3-4 times per day in the morning Constipation: no Blood in stool: no Blood in vomit: no Fever: no Dysuria: no Loss of appetite: no Weight loss: no  Vaginal Bleeding: postmenopausal    Filed Vitals:   06/06/14 1626  BP: 158/90  Pulse:   Temp:   Resp:     Review of Systems  HENT: Positive for ear pain (left).   Gastrointestinal: Positive for heartburn, nausea, vomiting, abdominal pain and diarrhea. Negative for constipation and blood in stool.  Skin:       Callus on ball of left foot--painful Several abscess in groin--now resolved but usually comes back soon  Neurological: Positive for dizziness. Negative for tingling.    PMH - Smoker and GERD She would like a podiatry referral for callus removal.   Physical Exam  Cardiovascular: Normal rate, regular rhythm and normal heart sounds.   Pulmonary/Chest: Effort normal and breath sounds normal.  Abdominal: Soft. Bowel sounds are normal. She exhibits no distension. There is no tenderness.  Skin: Skin is warm and dry.    Mckenzee was seen today for abdominal pain.  Diagnoses and all orders for this visit:  Gastroesophageal reflux disease, esophagitis presence not specified Orders: -     Begin omeprazole (PRILOSEC) 20 MG capsule; Take 1 capsule (20 mg total) by mouth 2 (two) times daily before a meal. Discussed diet and weight with patient relating to acid reflux.  Went over things that may exacerbate acid reflux such as tomatoes, spicy foods, coffee, carbonated beverages, chocolates, etc.  Advised patient to avoid laying down at least two  hours after meals and sleep with HOB elevated.   Callus of foot Orders: -     Ambulatory referral to Podiatry---will require removal for pain relief   Smoker Smoking cessation discussed for 3 minutes, patient is not willing to quit at this time. Will continue to assess on each visit. Discussed increased risk for diseases such as cancer, heart disease, and stroke.   Preventative health care Orders: -     Ambulatory referral to Gynecology Health Maintenance Up to date on mammogram Had a procedure in past which caused scar tissue in vagina so has been unable to have speculum inserted. Will get records sent here. She states that she prefers GYN to do her exam.   Refused influenza vaccine/Refused pneumococcal vaccination Explained that annual influenza is recommended per CDC guidelines and is highly suggested to anyone who has has CHF, COPD, DM or immunocompromised. Benefits of influenza described in detail.  May follow up as needed or if no improvement in symptoms  Chari Manning, NP 06/06/2014 4:41 PM

## 2014-06-16 ENCOUNTER — Ambulatory Visit (HOSPITAL_COMMUNITY)
Admission: RE | Admit: 2014-06-16 | Discharge: 2014-06-16 | Disposition: A | Discharge status: Home / Self Care | Payer: Medicaid Other | Source: Ambulatory Visit | Attending: Cardiology | Admitting: Cardiology

## 2014-06-16 ENCOUNTER — Other Ambulatory Visit (HOSPITAL_COMMUNITY)
Admission: RE | Admit: 2014-06-16 | Discharge: 2014-06-16 | Disposition: A | Discharge status: Home / Self Care | Payer: Medicaid Other | Source: Ambulatory Visit | Attending: Internal Medicine | Admitting: Internal Medicine

## 2014-06-16 ENCOUNTER — Other Ambulatory Visit: Payer: Self-pay

## 2014-06-16 ENCOUNTER — Ambulatory Visit
Admission: RE | Admit: 2014-06-16 | Discharge: 2014-06-16 | Disposition: A | Discharge status: Home / Self Care | Payer: Medicaid Other | Source: Ambulatory Visit | Attending: Internal Medicine | Admitting: Internal Medicine

## 2014-06-16 ENCOUNTER — Encounter: Payer: Self-pay | Admitting: Internal Medicine

## 2014-06-16 ENCOUNTER — Ambulatory Visit (HOSPITAL_BASED_OUTPATIENT_CLINIC_OR_DEPARTMENT_OTHER): Payer: Medicaid Other | Admitting: Internal Medicine

## 2014-06-16 VITALS — BP 117/79 | HR 91 | Temp 98.8°F | Resp 16 | Ht 64.0 in | Wt 194.0 lb

## 2014-06-16 DIAGNOSIS — Z01419 Encounter for gynecological examination (general) (routine) without abnormal findings: Secondary | ICD-10-CM | POA: Diagnosis not present

## 2014-06-16 DIAGNOSIS — N760 Acute vaginitis: Secondary | ICD-10-CM | POA: Insufficient documentation

## 2014-06-16 DIAGNOSIS — R079 Chest pain, unspecified: Secondary | ICD-10-CM | POA: Diagnosis not present

## 2014-06-16 DIAGNOSIS — Z1151 Encounter for screening for human papillomavirus (HPV): Secondary | ICD-10-CM | POA: Insufficient documentation

## 2014-06-16 DIAGNOSIS — Z124 Encounter for screening for malignant neoplasm of cervix: Secondary | ICD-10-CM

## 2014-06-16 DIAGNOSIS — Z113 Encounter for screening for infections with a predominantly sexual mode of transmission: Secondary | ICD-10-CM | POA: Insufficient documentation

## 2014-06-16 DIAGNOSIS — M25512 Pain in left shoulder: Secondary | ICD-10-CM | POA: Insufficient documentation

## 2014-06-16 IMAGING — DX DG CHEST 2V
2 series · 2 of 2 positions shown · non-contrast
Comparison: [DATE]

CLINICAL DATA: Chest pain for 2 weeks.  Prior infection.

EXAM:
CHEST  2 VIEW

[chest pa]
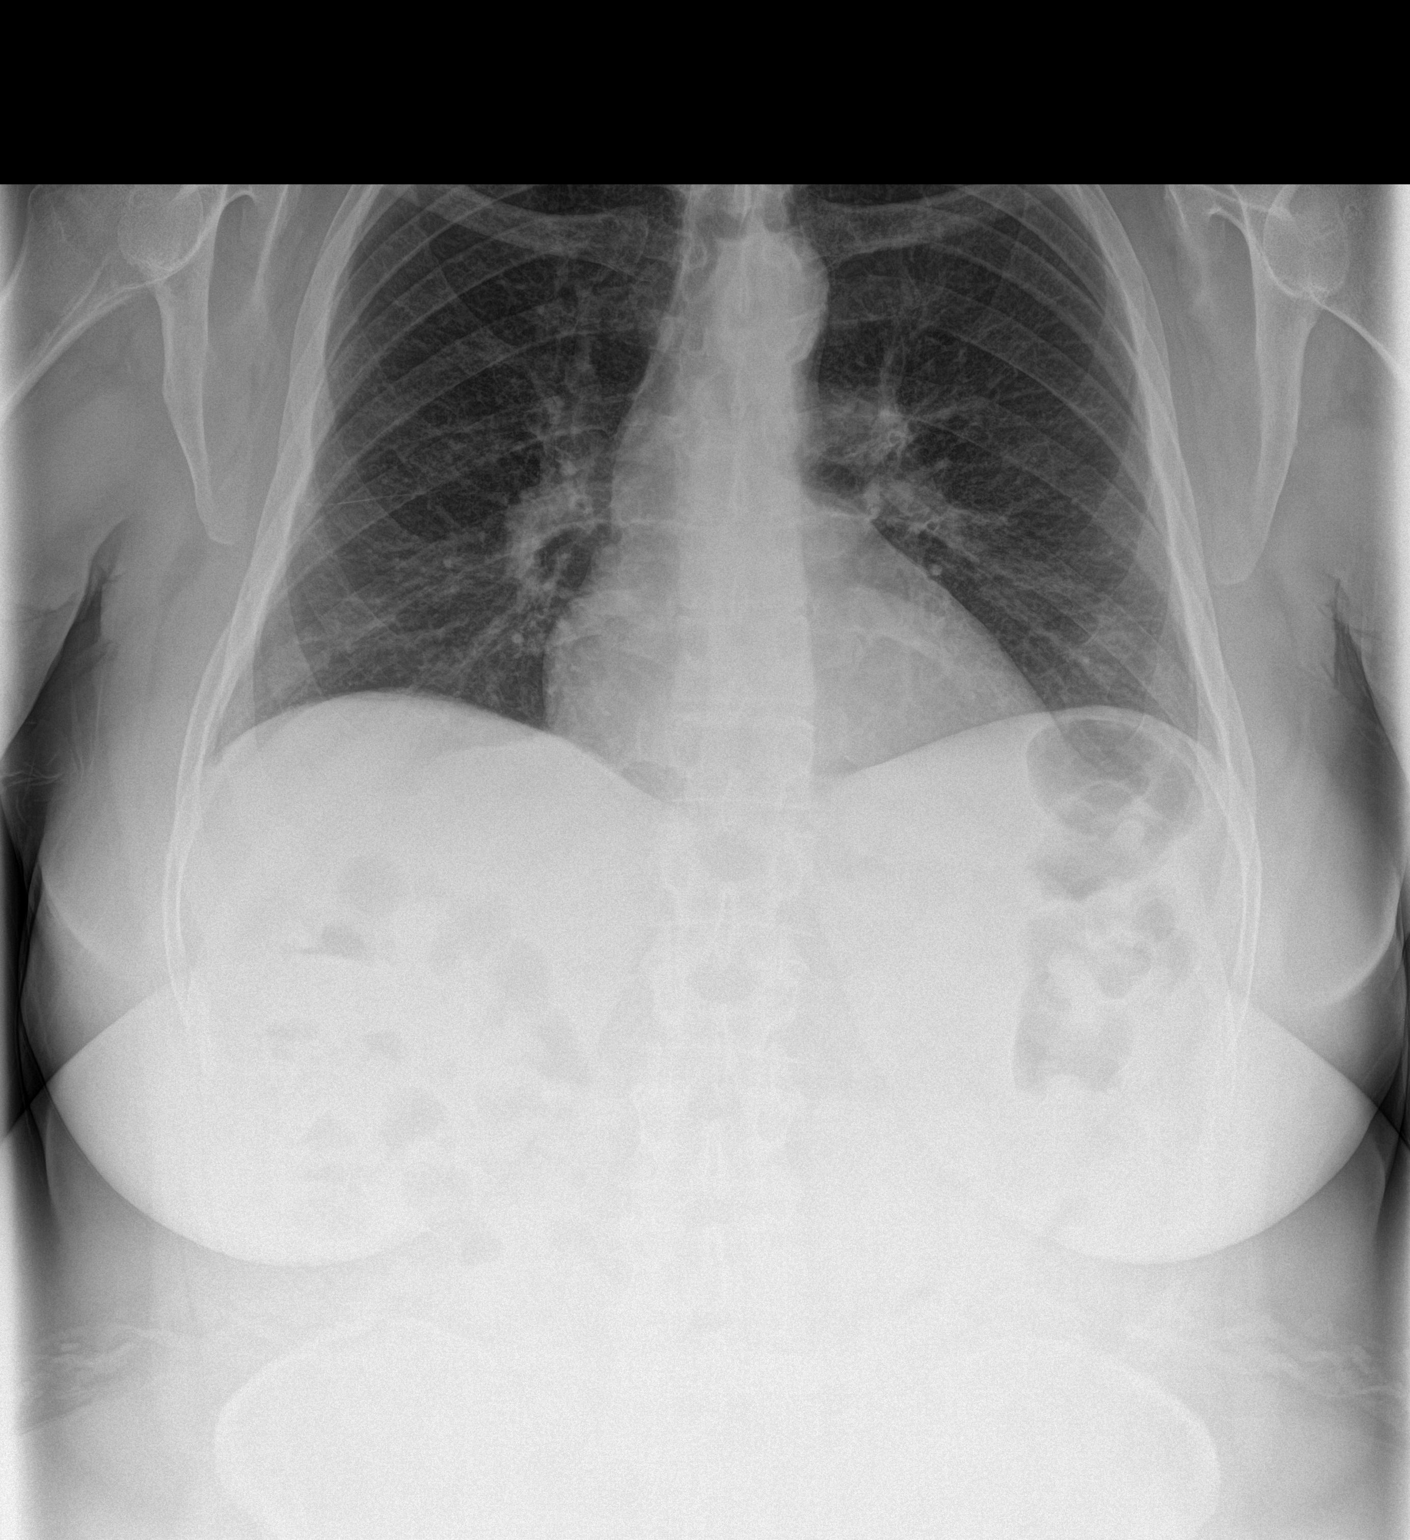

[chest lat]
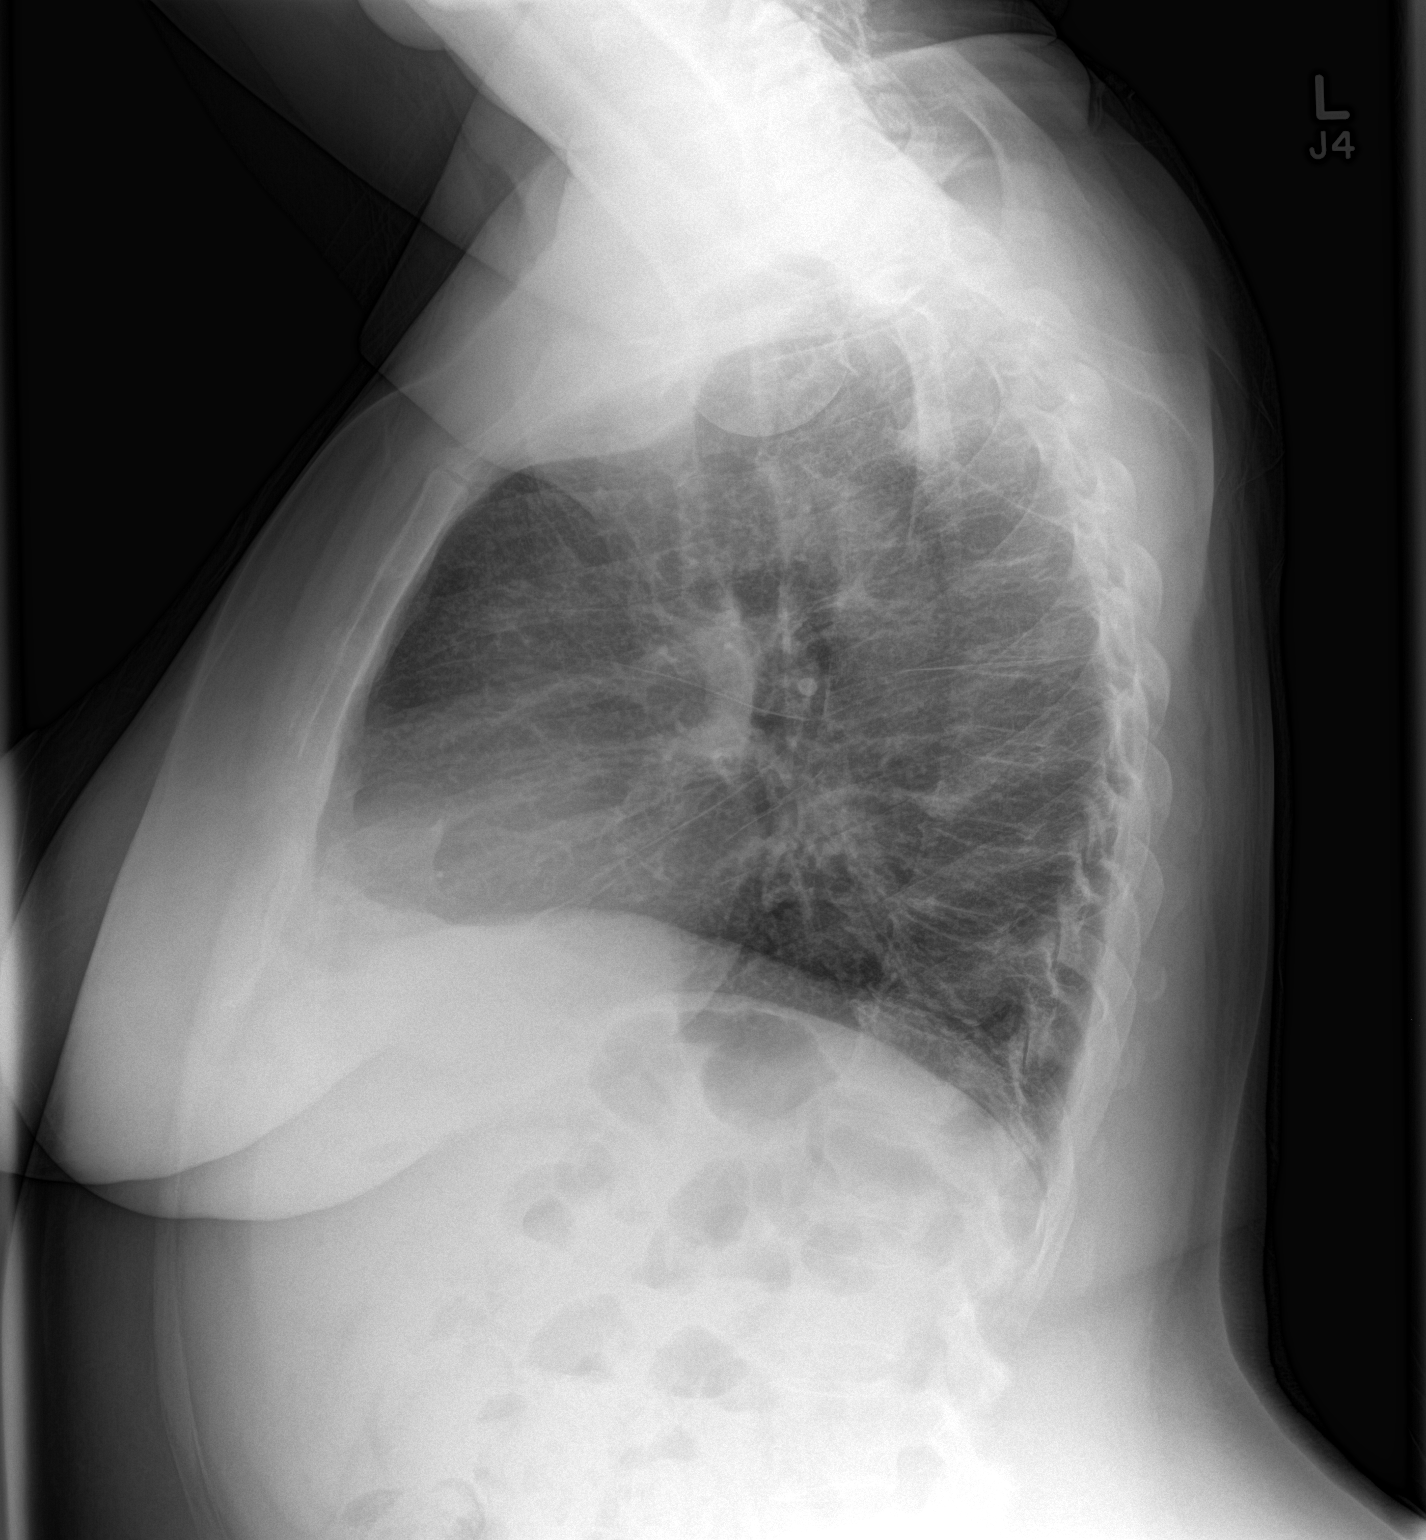

[2 of 2 positions shown; findings below may reference images not displayed]

FINDINGS: Airway thickening is present, suggesting bronchitis or reactive
airways disease. Atherosclerotic aortic arch. Heart size within
normal limits. No airspace opacity identified.

No pleural effusion.  Mild thoracic spondylosis.
IMPRESSION: 1. Airway thickening is present, suggesting bronchitis or reactive
airways disease.

## 2014-06-16 MED ORDER — NAPROXEN 500 MG PO TABS: 500.0000 mg | ORAL_TABLET | Freq: Two times a day (BID) | ORAL | Status: DC

## 2014-06-16 NOTE — Progress Notes (Signed)
Pt is here today for a pap smear. Pt reports having chest and shoulder pain.

## 2014-06-16 NOTE — Progress Notes (Signed)
Patient ID: Janice Adams, female   DOB: Mar 16, 1956, 59 y.o.   MRN: [id number]  CC: pap smear  HPI: Janice Adams is a 59 y.o. female here today for a follow up visit.  Patient has past medical history of GERD.  She presents to clinic today for pap smear and left shoulder/chest pain. Patient has had left chest pain below the left shoulder for the past 3 days.  The pain is aggravated by certain movements such as twisting.  It is located below her left shoulder and is described as a pressure sensations. She denies SOB with events.  Patient is concerned that her URI from last month never resolved because the pain feels very similar. Patient also c/o of left shoulder pain began a few hours ago, which is stiff and aching.  Patient reports limited overhead range of motion. Pain is aggravated by attempting to lift objects with her left arm.  Patient denies breast changes, vaginal discharge, itch, odor, and dysuria.   Patient has No headache, No abdominal pain - No Nausea, No new weakness tingling or numbness, No Cough - SOB.  No Known Allergies History reviewed. No pertinent past medical history. Current Outpatient Prescriptions on File Prior to Visit  Medication Sig Dispense Refill  . omeprazole (PRILOSEC) 20 MG capsule Take 1 capsule (20 mg total) by mouth 2 (two) times daily before a meal. 60 capsule 8   No current facility-administered medications on file prior to visit.   Family History  Problem Relation Age of Onset  . Breast cancer Mother   . Breast cancer Maternal Grandmother   . Breast cancer Daughter    History   Social History  . Marital Status: Divorced    Spouse Name: N/A    Number of Children: 24  . Years of Education: N/A   Occupational History  . Childcare    Social History Main Topics  . Smoking status: Current Every Day Smoker  . Smokeless tobacco: Never Used  . Alcohol Use: No  . Drug Use: Yes    Special: Marijuana  . Sexual Activity: Not on file   Other  Topics Concern  . Not on file   Social History Narrative    Review of Systems: As per HPI   Objective:   Filed Vitals:   06/16/14 1145  BP: 117/79  Pulse: 91  Temp: 98.8 F (37.1 C)  Resp: 16    Physical Exam  Cardiovascular: Normal rate, regular rhythm and normal heart sounds.   Pulmonary/Chest: Effort normal and breath sounds normal. Right breast exhibits mass. Left breast exhibits no mass.  Abdominal: Soft. She exhibits no distension. There is no tenderness.  Genitourinary: Vagina normal and uterus normal. No breast tenderness or discharge. Cervix exhibits no motion tenderness, no discharge and no friability. Right adnexum displays no tenderness. Left adnexum displays no tenderness.  Musculoskeletal: She exhibits no tenderness (chest discomfort is non-reporducible ).  Lymphadenopathy:       Right: No inguinal adenopathy present.       Left: No inguinal adenopathy present.  Skin: Skin is warm and dry.     Lab Results  Component Value Date   WBC 5.8 05/16/2014   HGB 13.2 05/16/2014   HCT 38.8 05/16/2014   MCV 94.6 05/16/2014   PLT 369 05/16/2014   Lab Results  Component Value Date   CREATININE 0.84 05/16/2014   BUN 7 05/16/2014   NA 140 05/16/2014   K 3.5 05/16/2014   CL 105 05/16/2014   CO2  28 05/16/2014    Lab Results  Component Value Date   HGBA1C 5.5 11/08/2012   Lipid Panel     Component Value Date/Time   CHOL 212* 11/08/2012 1212   TRIG 131 11/08/2012 1212   HDL 55 11/08/2012 1212   CHOLHDL 3.9 11/08/2012 1212   VLDL 26 11/08/2012 1212   LDLCALC 131* 11/08/2012 1212       Assessment and plan:   Janice Adams was seen today for follow-up.  Diagnoses and associated orders for this visit:  Papanicolaou smear - Cytology - PAP [place] - Cervicovaginal ancillary only - HIV antibody (with reflex); Future - RPR; Future  Chest pain, unspecified chest pain type - Lipid panel; Future - DG Chest 2 View; ---to r/o infection Negative  EKG Explained signs and symptoms that should warrant immediate attention.  Patient verbalized understanding with teach back used.  Left shoulder pain - naproxen (NAPROSYN) 500 MG tablet; Take 1 tablet (500 mg total) by mouth 2 (two) times daily with a meal.   Return for this week fasting labs and prn.        Chari Manning, NP-C [facility] [phone] 06/16/2014, 12:11 PM

## 2014-06-16 NOTE — Patient Instructions (Signed)
Go get chest xray and I will call you with results soon

## 2014-06-17 ENCOUNTER — Telehealth: Payer: Self-pay | Admitting: *Deleted

## 2014-06-17 LAB — CERVICOVAGINAL ANCILLARY ONLY
CHLAMYDIA, DNA PROBE: NEGATIVE
NEISSERIA GONORRHEA: NEGATIVE
Wet Prep (BD Affirm): NEGATIVE
Wet Prep (BD Affirm): NEGATIVE
Wet Prep (BD Affirm): POSITIVE — AB

## 2014-06-17 NOTE — Telephone Encounter (Signed)
Pt aware of results 

## 2014-06-17 NOTE — Telephone Encounter (Signed)
-----   Message from Lance Bosch, NP sent at 06/17/2014 12:19 AM EST ----- Infection resolved but xray suggest that it may be bronchitis

## 2014-06-18 LAB — CYTOLOGY - PAP

## 2014-06-19 ENCOUNTER — Other Ambulatory Visit: Payer: Medicaid Other

## 2014-06-24 ENCOUNTER — Telehealth: Payer: Self-pay | Admitting: *Deleted

## 2014-06-24 MED ORDER — METRONIDAZOLE 500 MG PO TABS: 500.0000 mg | ORAL_TABLET | Freq: Two times a day (BID) | ORAL | Status: DC

## 2014-06-24 NOTE — Telephone Encounter (Signed)
-----   Message from Lance Bosch, NP sent at 06/20/2014 11:05 PM EST ----- Patient positive for Bacterial Vaginosis . Please explain this is not a STD, but a imbalance of the vaginal pH. Please send Flagyl 500 mg BID for 7 days. No refills, no alcohol while on this medication.  Negative for malignancies, will repeat in 3 years

## 2014-06-24 NOTE — Telephone Encounter (Signed)
Rx send to [facility] to contact Pt. No answer

## 2014-07-07 ENCOUNTER — Ambulatory Visit: Payer: Medicaid Other | Attending: Internal Medicine | Admitting: Internal Medicine

## 2014-07-07 VITALS — BP 100/63 | HR 98 | Temp 98.2°F | Resp 20

## 2014-07-07 DIAGNOSIS — Z0130 Encounter for examination of blood pressure without abnormal findings: Secondary | ICD-10-CM

## 2014-07-07 DIAGNOSIS — F17210 Nicotine dependence, cigarettes, uncomplicated: Secondary | ICD-10-CM | POA: Diagnosis not present

## 2014-07-07 DIAGNOSIS — Z136 Encounter for screening for cardiovascular disorders: Secondary | ICD-10-CM

## 2014-07-07 NOTE — Patient Instructions (Addendum)
Smoking Cessation Quitting smoking is important to your health and has many advantages. However, it is not always easy to quit since nicotine is a very addictive drug. Oftentimes, people try 3 times or more before being able to quit. This document explains the best ways for you to prepare to quit smoking. Quitting takes hard work and a lot of effort, but you can do it. ADVANTAGES OF QUITTING SMOKING  You will live longer, feel better, and live better.  Your body will feel the impact of quitting smoking almost immediately.  Within 20 minutes, blood pressure decreases. Your pulse returns to its normal level.  After 8 hours, carbon monoxide levels in the blood return to normal. Your oxygen level increases.  After 24 hours, the chance of having a heart attack starts to decrease. Your breath, hair, and body stop smelling like smoke.  After 48 hours, damaged nerve endings begin to recover. Your sense of taste and smell improve.  After 72 hours, the body is virtually free of nicotine. Your bronchial tubes relax and breathing becomes easier.  After 2 to 12 weeks, lungs can hold more air. Exercise becomes easier and circulation improves.  The risk of having a heart attack, stroke, cancer, or lung disease is greatly reduced.  After 1 year, the risk of coronary heart disease is cut in half.  After 5 years, the risk of stroke falls to the same as a nonsmoker.  After 10 years, the risk of lung cancer is cut in half and the risk of other cancers decreases significantly.  After 15 years, the risk of coronary heart disease drops, usually to the level of a nonsmoker.  If you are pregnant, quitting smoking will improve your chances of having a healthy baby.  The people you live with, especially any children, will be healthier.  You will have extra money to spend on things other than cigarettes. QUESTIONS TO THINK ABOUT BEFORE ATTEMPTING TO QUIT You may want to talk about your answers with your  health care provider.  Why do you want to quit?  If you tried to quit in the past, what helped and what did not?  What will be the most difficult situations for you after you quit? How will you plan to handle them?  Who can help you through the tough times? Your family? Friends? A health care provider?  What pleasures do you get from smoking? What ways can you still get pleasure if you quit? Here are some questions to ask your health care provider:  How can you help me to be successful at quitting?  What medicine do you think would be best for me and how should I take it?  What should I do if I need more help?  What is smoking withdrawal like? How can I get information on withdrawal? GET READY  Set a quit date.  Change your environment by getting rid of all cigarettes, ashtrays, matches, and lighters in your home, car, or work. Do not let people smoke in your home.  Review your past attempts to quit. Think about what worked and what did not. GET SUPPORT AND ENCOURAGEMENT You have a better chance of being successful if you have help. You can get support in many ways.  Tell your family, friends, and coworkers that you are going to quit and need their support. Ask them not to smoke around you.  Get individual, group, or telephone counseling and support. Programs are available at local hospitals and health centers. Call   your local health department for information about programs in your area.  Spiritual beliefs and practices may help some smokers quit.  Download a "quit meter" on your computer to keep track of quit statistics, such as how long you have gone without smoking, cigarettes not smoked, and money saved.  Get a self-help book about quitting smoking and staying off tobacco. LEARN NEW SKILLS AND BEHAVIORS  Distract yourself from urges to smoke. Talk to someone, go for a walk, or occupy your time with a task.  Change your normal routine. Take a different route to work.  Drink tea instead of coffee. Eat breakfast in a different place.  Reduce your stress. Take a hot bath, exercise, or read a book.  Plan something enjoyable to do every day. Reward yourself for not smoking.  Explore interactive web-based programs that specialize in helping you quit. GET MEDICINE AND USE IT CORRECTLY Medicines can help you stop smoking and decrease the urge to smoke. Combining medicine with the above behavioral methods and support can greatly increase your chances of successfully quitting smoking.  Nicotine replacement therapy helps deliver nicotine to your body without the negative effects and risks of smoking. Nicotine replacement therapy includes nicotine gum, lozenges, inhalers, nasal sprays, and skin patches. Some may be available over-the-counter and others require a prescription.  Antidepressant medicine helps people abstain from smoking, but how this works is unknown. This medicine is available by prescription.  Nicotinic receptor partial agonist medicine simulates the effect of nicotine in your brain. This medicine is available by prescription. Ask your health care provider for advice about which medicines to use and how to use them based on your health history. Your health care provider will tell you what side effects to look out for if you choose to be on a medicine or therapy. Carefully read the information on the package. Do not use any other product containing nicotine while using a nicotine replacement product.  RELAPSE OR DIFFICULT SITUATIONS Most relapses occur within the first 3 months after quitting. Do not be discouraged if you start smoking again. Remember, most people try several times before finally quitting. You may have symptoms of withdrawal because your body is used to nicotine. You may crave cigarettes, be irritable, feel very hungry, cough often, get headaches, or have difficulty concentrating. The withdrawal symptoms are only temporary. They are strongest  when you first quit, but they will go away within 10-14 days. To reduce the chances of relapse, try to:  Avoid drinking alcohol. Drinking lowers your chances of successfully quitting.  Reduce the amount of caffeine you consume. Once you quit smoking, the amount of caffeine in your body increases and can give you symptoms, such as a rapid heartbeat, sweating, and anxiety.  Avoid smokers because they can make you want to smoke.  Do not let weight gain distract you. Many smokers will gain weight when they quit, usually less than 10 pounds. Eat a healthy diet and stay active. You can always lose the weight gained after you quit.  Find ways to improve your mood other than smoking. FOR MORE INFORMATION  www.smokefree.gov  Document Released: 04/19/2001 Document Revised: 09/09/2013 Document Reviewed: 08/04/2011 [employer] Patient Information 2015 [employer]. This information is not intended to replace advice given to you by your health care provider. Make sure you discuss any questions you have with your health care provider. DASH Eating Plan DASH stands for "Dietary Approaches to Stop Hypertension." The DASH eating plan is a healthy eating plan that has   been shown to reduce high blood pressure (hypertension). Additional health benefits may include reducing the risk of type 2 diabetes mellitus, heart disease, and stroke. The DASH eating plan may also help with weight loss. WHAT DO I NEED TO KNOW ABOUT THE DASH EATING PLAN? For the DASH eating plan, you will follow these general guidelines:  Choose foods with a percent daily value for sodium of less than 5% (as listed on the food label).  Use salt-free seasonings or herbs instead of table salt or sea salt.  Check with your health care provider or pharmacist before using salt substitutes.  Eat lower-sodium products, often labeled as "lower sodium" or "no salt added."  Eat fresh foods.  Eat more vegetables, fruits, and low-fat dairy  products.  Choose whole grains. Look for the word "whole" as the first word in the ingredient list.  Choose fish and skinless chicken or turkey more often than red meat. Limit fish, poultry, and meat to 6 oz (170 g) each day.  Limit sweets, desserts, sugars, and sugary drinks.  Choose heart-healthy fats.  Limit cheese to 1 oz (28 g) per day.  Eat more home-cooked food and less restaurant, buffet, and fast food.  Limit fried foods.  Cook foods using methods other than frying.  Limit canned vegetables. If you do use them, rinse them well to decrease the sodium.  When eating at a restaurant, ask that your food be prepared with less salt, or no salt if possible. WHAT FOODS CAN I EAT? Seek help from a dietitian for individual calorie needs. Grains Whole grain or whole wheat bread. Brown rice. Whole grain or whole wheat pasta. Quinoa, bulgur, and whole grain cereals. Low-sodium cereals. Corn or whole wheat flour tortillas. Whole grain cornbread. Whole grain crackers. Low-sodium crackers. Vegetables Fresh or frozen vegetables (raw, steamed, roasted, or grilled). Low-sodium or reduced-sodium tomato and vegetable juices. Low-sodium or reduced-sodium tomato sauce and paste. Low-sodium or reduced-sodium canned vegetables.  Fruits All fresh, canned (in natural juice), or frozen fruits. Meat and Other Protein Products Ground beef (85% or leaner), grass-fed beef, or beef trimmed of fat. Skinless chicken or turkey. Ground chicken or turkey. Pork trimmed of fat. All fish and seafood. Eggs. Dried beans, peas, or lentils. Unsalted nuts and seeds. Unsalted canned beans. Dairy Low-fat dairy products, such as skim or 1% milk, 2% or reduced-fat cheeses, low-fat ricotta or cottage cheese, or plain low-fat yogurt. Low-sodium or reduced-sodium cheeses. Fats and Oils Tub margarines without trans fats. Light or reduced-fat mayonnaise and salad dressings (reduced sodium). Avocado. Safflower, olive, or canola  oils. Natural peanut or almond butter. Other Unsalted popcorn and pretzels. The items listed above may not be a complete list of recommended foods or beverages. Contact your dietitian for more options. WHAT FOODS ARE NOT RECOMMENDED? Grains White bread. White pasta. White rice. Refined cornbread. Bagels and croissants. Crackers that contain trans fat. Vegetables Creamed or fried vegetables. Vegetables in a cheese sauce. Regular canned vegetables. Regular canned tomato sauce and paste. Regular tomato and vegetable juices. Fruits Dried fruits. Canned fruit in light or heavy syrup. Fruit juice. Meat and Other Protein Products Fatty cuts of meat. Ribs, chicken wings, bacon, sausage, bologna, salami, chitterlings, fatback, hot dogs, bratwurst, and packaged luncheon meats. Salted nuts and seeds. Canned beans with salt. Dairy Whole or 2% milk, cream, half-and-half, and cream cheese. Whole-fat or sweetened yogurt. Full-fat cheeses or blue cheese. Nondairy creamers and whipped toppings. Processed cheese, cheese spreads, or cheese curds. Condiments Onion and garlic salt,   seasoned salt, table salt, and sea salt. Canned and packaged gravies. Worcestershire sauce. Tartar sauce. Barbecue sauce. Teriyaki sauce. Soy sauce, including reduced sodium. Steak sauce. Fish sauce. Oyster sauce. Cocktail sauce. Horseradish. Ketchup and mustard. Meat flavorings and tenderizers. Bouillon cubes. Hot sauce. Tabasco sauce. Marinades. Taco seasonings. Relishes. Fats and Oils Butter, stick margarine, lard, shortening, ghee, and bacon fat. Coconut, palm kernel, or palm oils. Regular salad dressings. Other Pickles and olives. Salted popcorn and pretzels. The items listed above may not be a complete list of foods and beverages to avoid. Contact your dietitian for more information. WHERE CAN I FIND MORE INFORMATION? National Heart, Lung, and Blood Institute: travelstabloid.com Document Released:  04/14/2011 Document Revised: 09/09/2013 Document Reviewed: 02/27/2013 [facility] Patient Information 2015 [place], [place]. This information is not intended to replace advice given to you by your health care provider. Make sure you discuss any questions you have with your health care provider. Bacterial Vaginosis Bacterial vaginosis is a vaginal infection that occurs when the normal balance of bacteria in the vagina is disrupted. It results from an overgrowth of certain bacteria. This is the most common vaginal infection in women of childbearing age. Treatment is important to prevent complications, especially in pregnant women, as it can cause a premature delivery. CAUSES  Bacterial vaginosis is caused by an increase in harmful bacteria that are normally present in smaller amounts in the vagina. Several different kinds of bacteria can cause bacterial vaginosis. However, the reason that the condition develops is not fully understood. RISK FACTORS Certain activities or behaviors can put you at an increased risk of developing bacterial vaginosis, including:  Having a new sex partner or multiple sex partners.  Douching.  Using an intrauterine device (IUD) for contraception. Women do not get bacterial vaginosis from toilet seats, bedding, swimming pools, or contact with objects around them. SIGNS AND SYMPTOMS  Some women with bacterial vaginosis have no signs or symptoms. Common symptoms include:  Grey vaginal discharge.  A fishlike odor with discharge, especially after sexual intercourse.  Itching or burning of the vagina and vulva.  Burning or pain with urination. DIAGNOSIS  Your health care provider will take a medical history and examine the vagina for signs of bacterial vaginosis. A sample of vaginal fluid may be taken. Your health care provider will look at this sample under a microscope to check for bacteria and abnormal cells. A vaginal pH test may also be done.  TREATMENT  Bacterial  vaginosis may be treated with antibiotic medicines. These may be given in the form of a pill or a vaginal cream. A second round of antibiotics may be prescribed if the condition comes back after treatment.  HOME CARE INSTRUCTIONS   Only take over-the-counter or prescription medicines as directed by your health care provider.  If antibiotic medicine was prescribed, take it as directed. Make sure you finish it even if you start to feel better.  Do not have sex until treatment is completed.  Tell all sexual partners that you have a vaginal infection. They should see their health care provider and be treated if they have problems, such as a mild rash or itching.  Practice safe sex by using condoms and only having one sex partner. SEEK MEDICAL CARE IF:   Your symptoms are not improving after 3 days of treatment.  You have increased discharge or pain.  You have a fever. MAKE SURE YOU:   Understand these instructions.  Will watch your condition.  Will get help right away if  you are not doing well or get worse. FOR MORE INFORMATION  Centers for Disease Control and Prevention, Division of STD Prevention: AppraiserFraud.fi American Sexual Health Association (ASHA): www.ashastd.org  Document Released: 04/25/2005 Document Revised: 02/13/2013 Document Reviewed: 12/05/2012 [facility] Patient Information 2015 [place], [place]. This information is not intended to replace advice given to you by your health care provider. Make sure you discuss any questions you have with your health care provider.

## 2014-07-07 NOTE — Progress Notes (Signed)
Patient presents for BP check Med list reviewed; states taking prilosec as directed Labs from last OV reviewed with pt; Patient aware that she is to begin flagyl and avoid alcohol while taking. Discussed need for low sodium diet and using Mrs. Dash as alternative to salt Discussed walking 30 minutes per day for exercise; c/o "callus" on bottom of left foot that makes walking difficult. Patient denies headaches, SHOB, chest pain or pressure C/o blurred vision today. Does not wear corrective lenses Smoking 5 cigs/day; trying to quit Declined flu vaccine  BP 100/63 P 98 R 20  T  98.2 oral SPO2  98%  Patient given literature on DASH Eating Plan, Smoking Cessation, and Bacterial Vaginosis  Patient encouraged to f/u with PCP prn for left foot pain

## 2014-07-08 ENCOUNTER — Other Ambulatory Visit: Payer: Medicaid Other

## 2014-07-21 ENCOUNTER — Ambulatory Visit: Payer: Medicaid Other | Attending: Internal Medicine

## 2014-07-21 DIAGNOSIS — R079 Chest pain, unspecified: Secondary | ICD-10-CM

## 2014-07-21 DIAGNOSIS — Z124 Encounter for screening for malignant neoplasm of cervix: Secondary | ICD-10-CM

## 2014-07-21 LAB — LIPID PANEL
Cholesterol: 219 mg/dL — ABNORMAL HIGH (ref 0–200)
HDL: 59 mg/dL (ref >=46)
LDL CALC: 138 mg/dL — AB (ref 0–99)
TRIGLYCERIDES: 112 mg/dL (ref <150)
Total CHOL/HDL Ratio: 3.7 Ratio
VLDL: 22 mg/dL (ref 0–40)

## 2014-07-21 LAB — RPR

## 2014-07-22 LAB — HIV ANTIBODY (ROUTINE TESTING W REFLEX): HIV: NONREACTIVE

## 2014-07-24 ENCOUNTER — Telehealth: Payer: Self-pay | Admitting: Emergency Medicine

## 2014-07-24 ENCOUNTER — Telehealth: Payer: Self-pay | Admitting: *Deleted

## 2014-07-24 NOTE — Telephone Encounter (Signed)
-----   Message from Lance Bosch, NP sent at 07/22/2014 10:36 AM EDT ----- Patient cholesterol is slightly higher than last year. Please send rx for atorvastatin 10 mg to take daily. Please explain how elevated cholesterol clogs the arteries and may lead to stroke and heart attack. Let her know that we will recheck the levels in 6 months. Please address diet specifics and exercise with patient.

## 2014-07-24 NOTE — Telephone Encounter (Signed)
Patient called and requested her Omeprazole be refilled

## 2014-07-24 NOTE — Telephone Encounter (Signed)
Left message with family member to return my call.

## 2014-08-01 ENCOUNTER — Telehealth: Payer: Self-pay | Admitting: Internal Medicine

## 2014-08-07 ENCOUNTER — Telehealth: Payer: Self-pay | Admitting: Internal Medicine

## 2014-08-07 NOTE — Telephone Encounter (Signed)
Patient called to request a med changed for her infection, patient states that the pill is too big and is unable to take it. She would like a smaller size pill prescribed, patient states that she is still experiencing vaginal itching. Please f/u with pt.

## 2014-08-11 ENCOUNTER — Other Ambulatory Visit: Payer: Self-pay

## 2014-08-11 DIAGNOSIS — Z1231 Encounter for screening mammogram for malignant neoplasm of breast: Secondary | ICD-10-CM

## 2014-08-28 ENCOUNTER — Ambulatory Visit
Admission: RE | Admit: 2014-08-28 | Discharge: 2014-08-28 | Disposition: A | Discharge status: Home / Self Care | Payer: Medicaid Other | Source: Ambulatory Visit

## 2014-08-28 DIAGNOSIS — Z1231 Encounter for screening mammogram for malignant neoplasm of breast: Secondary | ICD-10-CM

## 2014-09-08 ENCOUNTER — Telehealth: Payer: Self-pay | Admitting: *Deleted

## 2014-09-08 NOTE — Telephone Encounter (Signed)
-----   Message from Lance Bosch, NP sent at 09/01/2014 10:42 PM EDT ----- No evidence of malignancy on mammogram

## 2014-09-08 NOTE — Telephone Encounter (Signed)
Pt is aware of her MM results.

## 2014-09-22 ENCOUNTER — Ambulatory Visit: Payer: Medicaid Other | Admitting: Internal Medicine

## 2014-09-22 ENCOUNTER — Telehealth: Payer: Self-pay | Admitting: Internal Medicine

## 2014-09-22 ENCOUNTER — Other Ambulatory Visit: Payer: Self-pay | Admitting: Internal Medicine

## 2014-09-22 DIAGNOSIS — L84 Corns and callosities: Secondary | ICD-10-CM

## 2014-09-22 NOTE — Telephone Encounter (Signed)
Patient has come in today for an appointment but was tardy; patient has requested a referral to podiatry if possible; please f/u with patient about this request and if she needs to come in for an evaluation first;

## 2014-10-14 ENCOUNTER — Encounter: Payer: Self-pay | Admitting: *Deleted

## 2014-10-14 NOTE — Progress Notes (Signed)
Patient ID: Janice Adams, female   DOB: [birth year], 59 y.o.   MRN: [id number]   Patient calling in asking about referral to podiatry.  According to chart orders were placed but no notes as to status.  Note sent to referral coordinator, Maren Reamer to follow up with patient.

## 2014-10-22 ENCOUNTER — Telehealth: Payer: Self-pay | Admitting: *Deleted

## 2014-10-22 NOTE — Telephone Encounter (Signed)
-----   Message from Ena Dawley sent at 10/17/2014  3:32 PM EDT ----- Regarding: RE: podiatry referral [facility] put a note in the referral  05/2014  Patient's insurance has [facility] listed on it. Will need a referral from Dr. Jeanie Cooks before we can schedule an appointment to see the patient. ----- Message -----    From: Esperanza Sheets, RN    Sent: 10/14/2014  10:28 AM      To: Ena Dawley Subject: podiatry referral                              Podiatry referral placed mid May.  There is no note as to status of referral will you please call patient and let her know she is calling asking.  Thanks!

## 2014-10-22 NOTE — Telephone Encounter (Signed)
Patient called and states she has a podiatry appointment with [facility] this month.

## 2014-10-28 ENCOUNTER — Ambulatory Visit: Payer: Medicaid Other | Admitting: Podiatry

## 2014-11-13 ENCOUNTER — Ambulatory Visit (INDEPENDENT_AMBULATORY_CARE_PROVIDER_SITE_OTHER): Payer: Medicaid Other | Admitting: Podiatry

## 2014-11-13 ENCOUNTER — Encounter: Payer: Self-pay | Admitting: Podiatry

## 2014-11-13 VITALS — BP 100/59 | HR 100 | Temp 98.5°F | Resp 14

## 2014-11-13 DIAGNOSIS — Q828 Other specified congenital malformations of skin: Secondary | ICD-10-CM | POA: Diagnosis not present

## 2014-11-13 DIAGNOSIS — D367 Benign neoplasm of other specified sites: Secondary | ICD-10-CM

## 2014-11-13 NOTE — Progress Notes (Signed)
   Subjective:    Patient ID: Janice Adams, female    DOB: 1955-09-27, 59 y.o.   MRN: [id number]  HPI  Patient presents here today with left ball of foot pain and side of great toe pain, since 8 years ago. She said that walking aggitates the area and has not treated it with any home remedies. Patient presented to the office with a painful callus on the bottom of her forefoot. Patient has had the callus for  8 years and provided no self treatment. Patient walked into the treatment room limping. Review of Systems  All other systems reviewed and are negative.      Objective:   Physical Exam Objective: Review of past medical history, medications, social history and allergies were performed.  Vascular: Dorsalis pedis and posterior tibial pulses were palpable B/L, capillary refill was  WNL B/L, temperature gradient was WNL B/L   Skin:  No signs of symptoms of infection or ulcers on both feet, porokeratosis sub 2 left foot  Nails: Thick disfigured, discolored toenails both feet.  Sensory: Thornell Mule monifilament WNL   Orthopedic: Orthopedic evaluation demonstrates all joints distal t ankle have full ROM without crepitus, muscle power WNL B/L       Assessment & Plan:  Porokeratosis left foot  Debridement of porokeratosis left foot. IE

## 2015-01-17 ENCOUNTER — Encounter (HOSPITAL_COMMUNITY): Payer: Self-pay | Admitting: Emergency Medicine

## 2015-01-17 ENCOUNTER — Emergency Department (HOSPITAL_COMMUNITY)
Admission: EM | Admit: 2015-01-17 | Discharge: 2015-01-17 | Disposition: A | Discharge status: Home / Self Care | Payer: Medicaid Other | Attending: Emergency Medicine | Admitting: Emergency Medicine

## 2015-01-17 DIAGNOSIS — L02214 Cutaneous abscess of groin: Secondary | ICD-10-CM | POA: Diagnosis not present

## 2015-01-17 DIAGNOSIS — Z720 Tobacco use: Secondary | ICD-10-CM | POA: Insufficient documentation

## 2015-01-17 MED ORDER — LIDOCAINE HCL (PF) 1 % IJ SOLN
5.0000 mL | Freq: Once | INTRAMUSCULAR | Status: AC
  Administered 2015-01-17: 5 mL
  Filled 2015-01-17: qty 5

## 2015-01-17 MED ORDER — OXYCODONE-ACETAMINOPHEN 5-325 MG PO TABS
1.0000 | ORAL_TABLET | Freq: Once | ORAL | Status: AC
  Administered 2015-01-17: 1 via ORAL
  Filled 2015-01-17: qty 1

## 2015-01-17 MED ORDER — DOXYCYCLINE HYCLATE 100 MG PO CAPS: 100.0000 mg | ORAL_CAPSULE | Freq: Two times a day (BID) | ORAL | Status: DC

## 2015-01-17 MED ORDER — HYDROCODONE-ACETAMINOPHEN 5-325 MG PO TABS: 1.0000 | ORAL_TABLET | Freq: Four times a day (QID) | ORAL | Status: DC | PRN

## 2015-01-17 NOTE — ED Provider Notes (Signed)
CSN: [id number]     Arrival date & time 01/17/15  1606 History  This chart was scribed for [facility], PA-C, working with Dorie Rank, MD by Starleen Arms, ED Scribe. This patient was seen in room TR07C/TR07C and the patient's care was started at 4:37 PM.   Chief Complaint  Patient presents with  . Abscess   The history is provided by the patient. No language interpreter was used.    HPI Comments: Janice Adams is a 59 y.o. female with hx of abscess requiring I&D and no other significant hx presents to the Emergency Department complaining of a worsening, 10/10 abscess on the right upper thigh onset yesterday, no treatments tried.  She has soaked the area in warm water but tried no other treatments.    History reviewed. No pertinent past medical history. History reviewed. No pertinent past surgical history. Family History  Problem Relation Age of Onset  . Breast cancer Mother   . Breast cancer Maternal Grandmother   . Breast cancer Daughter    Social History  Substance Use Topics  . Smoking status: Current Some Day Smoker  . Smokeless tobacco: Never Used     Comment: Smoking 5 cigs/day  . Alcohol Use: No   OB History    No data available     Review of Systems  Constitutional: Negative for fever.  Skin: Positive for wound.  all other systems negataive  Allergies  Review of patient's allergies indicates no known allergies.  Home Medications   Prior to Admission medications   Medication Sig Start Date End Date Taking? Authorizing Provider  doxycycline (VIBRAMYCIN) 100 MG capsule Take 1 capsule (100 mg total) by mouth 2 (two) times daily. 01/17/15   [name] Bunnie Pion, NP  HYDROcodone-acetaminophen (NORCO) 5-325 MG per tablet Take 1 tablet by mouth every 6 (six) hours as needed. 01/17/15   Haim Hansson Bunnie Pion, NP  omeprazole (PRILOSEC) 20 MG capsule Take 1 capsule (20 mg total) by mouth 2 (two) times daily before a meal. 06/06/14   Lance Bosch, NP   BP 110/75 mmHg  Pulse 95  Temp(Src)  98.6 F (37 C) (Oral)  Resp 18  Ht '5\' 4"'$  (1.626 m)  Wt 185 lb 7 oz (84.114 kg)  BMI 31.81 kg/m2  SpO2 98% Physical Exam  Constitutional: She is oriented to person, place, and time. She appears well-developed and well-nourished. No distress.  HENT:  Head: Normocephalic and atraumatic.  Eyes: Conjunctivae and EOM are normal.  Neck: Neck supple. No tracheal deviation present.  Cardiovascular: Normal rate.   Pulmonary/Chest: Effort normal. No respiratory distress.  Musculoskeletal: Normal range of motion.  Neurological: She is alert and oriented to person, place, and time.  Skin: Skin is warm and dry.  Raised tender fluctuant area to the left inguinal area.  Some scarring from previous I&D in the area.   Psychiatric: She has a normal mood and affect. Her behavior is normal.  Nursing note and vitals reviewed.   ED Course  Procedures (including critical care time)  DIAGNOSTIC STUDIES: Oxygen Saturation is 97% on RA, normal by my interpretation.    COORDINATION OF CARE:  4:37 PM Discussed plan to obtain I&D.  Patient acknowledges and agrees with plan.    5:17 PM Area prepped and ready for procedure.  When I prepared to anesthetize her, patient said she did not think she could handle the pain. She decided not to precede with the I&D.   Will prescribe antibiotics.  Patient says she  will soak the area in tubs of warm water and f//u with her physician.     MDM  59 y.o. female with pain and swelling to the left inguinal area. Stable for d/c with antibiotics and follow up with her PCP. Discussed with the patient plan of care and all questioned fully answered. She will return if any problems arise.   Final diagnoses:  Abscess of left groin   I personally performed the services described in this documentation, which was scribed in my presence. The recorded information has been reviewed and is accurate.    [place], [place] 01/17/15 2102  Dorie Rank, MD 01/18/15 2041

## 2015-01-17 NOTE — Discharge Instructions (Signed)

## 2015-01-17 NOTE — ED Notes (Signed)
Pt reports abscess on her right upper thigh. Pt is afebrile. Pt alert x4. NAD at this time.

## 2015-01-26 ENCOUNTER — Inpatient Hospital Stay: Payer: Medicaid Other | Admitting: Internal Medicine

## 2015-02-04 ENCOUNTER — Inpatient Hospital Stay: Payer: Medicaid Other | Admitting: Internal Medicine

## 2015-02-11 ENCOUNTER — Encounter: Payer: Self-pay | Admitting: Internal Medicine

## 2015-02-11 ENCOUNTER — Ambulatory Visit: Payer: Medicaid Other | Attending: Internal Medicine | Admitting: Internal Medicine

## 2015-02-11 VITALS — BP 125/85 | HR 76 | Temp 98.0°F | Resp 16 | Ht 64.0 in | Wt 191.0 lb

## 2015-02-11 DIAGNOSIS — L02214 Cutaneous abscess of groin: Secondary | ICD-10-CM

## 2015-02-11 NOTE — Progress Notes (Signed)
Patient here for follow up from an abscess in her groin area Patient states the medications she was given makes her nausea Patient states that the abscess has since broke but feels like it may be festering up again

## 2015-02-11 NOTE — Progress Notes (Signed)
Patient ID: Ridhima Golberg, female   DOB: Mar 06, 1956, 59 y.o.   MRN: [id number]  CC: abscess  HPI: Tamee Battin is a 59 y.o. female here today for a follow up visit.  Patient has no past medical history. She reports that she was seen in the ED on 9/10 for a abscess in her left groin and was given antibiotics at that time. She states that she took the antibiotics for one day and had nausea and vomiting so she stopped the pills. She admits to taking medication on a empty stomach and taking at the same time as hydrocodone pills. She reports that the abscess is becoming more painful and feels like she needs antibiotics again. She denies fevers, chills, discharge from area, or erythema.   No Known Allergies No past medical history on file. Current Outpatient Prescriptions on File Prior to Visit  Medication Sig Dispense Refill  . doxycycline (VIBRAMYCIN) 100 MG capsule Take 1 capsule (100 mg total) by mouth 2 (two) times daily. 14 capsule 0  . HYDROcodone-acetaminophen (NORCO) 5-325 MG per tablet Take 1 tablet by mouth every 6 (six) hours as needed. 15 tablet 0  . omeprazole (PRILOSEC) 20 MG capsule Take 1 capsule (20 mg total) by mouth 2 (two) times daily before a meal. 60 capsule 8   No current facility-administered medications on file prior to visit.   Family History  Problem Relation Age of Onset  . Breast cancer Mother   . Breast cancer Maternal Grandmother   . Breast cancer Daughter    Social History   Social History  . Marital Status: Divorced    Spouse Name: N/A  . Number of Children: 5  . Years of Education: N/A   Occupational History  . Childcare    Social History Main Topics  . Smoking status: Current Some Day Smoker  . Smokeless tobacco: Never Used     Comment: Smoking 5 cigs/day  . Alcohol Use: No  . Drug Use: No  . Sexual Activity: Not on file   Other Topics Concern  . Not on file   Social History Narrative    Review of Systems: Other than what is stated in  HPI, all other systems are negative.   Objective:   Filed Vitals:   02/11/15 1713  BP: 125/85  Pulse: 76  Temp: 98 F (36.7 C)  Resp: 16    Physical Exam  Constitutional: She is oriented to person, place, and time.  Genitourinary:     Neurological: She is alert and oriented to person, place, and time.  Skin: Skin is warm and dry. No erythema.     Lab Results  Component Value Date   WBC 5.8 05/16/2014   HGB 13.2 05/16/2014   HCT 38.8 05/16/2014   MCV 94.6 05/16/2014   PLT 369 05/16/2014   Lab Results  Component Value Date   CREATININE 0.84 05/16/2014   BUN 7 05/16/2014   NA 140 05/16/2014   K 3.5 05/16/2014   CL 105 05/16/2014   CO2 28 05/16/2014    Lab Results  Component Value Date   HGBA1C 5.5 11/08/2012   Lipid Panel     Component Value Date/Time   CHOL 219* 07/21/2014 0946   TRIG 112 07/21/2014 0946   HDL 59 07/21/2014 0946   CHOLHDL 3.7 07/21/2014 0946   VLDL 22 07/21/2014 0946   LDLCALC 138* 07/21/2014 0946       Assessment and plan:   Kassadie was seen today for follow-up.  Diagnoses  and all orders for this visit:  Abscess of groin, left Patient has 7 days of doxycycline in her pill bottle currently from last month. I have advised patient to take Doxy with food for 2 doses and see if she continues to have nausea and vomiting. If she does she may call me back and I can switch prescription.   Return if symptoms worsen or fail to improve.       Lance Bosch, [facility] [phone] 02/11/2015, 9:52 PM

## 2015-02-11 NOTE — Patient Instructions (Signed)
Take the Doxycyline 1 pill twice per day with food. If medication continues to make you nauseous call me and I will switch it to something different.

## 2015-06-15 ENCOUNTER — Other Ambulatory Visit: Payer: Self-pay | Admitting: Internal Medicine

## 2015-08-13 ENCOUNTER — Other Ambulatory Visit: Payer: Self-pay

## 2015-08-13 DIAGNOSIS — Z1231 Encounter for screening mammogram for malignant neoplasm of breast: Secondary | ICD-10-CM

## 2015-09-02 ENCOUNTER — Ambulatory Visit
Admission: RE | Admit: 2015-09-02 | Discharge: 2015-09-02 | Disposition: A | Discharge status: Home / Self Care | Payer: Medicaid Other | Source: Ambulatory Visit

## 2015-09-02 DIAGNOSIS — Z1231 Encounter for screening mammogram for malignant neoplasm of breast: Secondary | ICD-10-CM

## 2015-10-11 ENCOUNTER — Emergency Department (HOSPITAL_COMMUNITY)
Admission: EM | Admit: 2015-10-11 | Discharge: 2015-10-11 | Disposition: A | Discharge status: Home / Self Care | Payer: Medicaid Other | Attending: Emergency Medicine | Admitting: Emergency Medicine

## 2015-10-11 ENCOUNTER — Encounter (HOSPITAL_COMMUNITY): Payer: Self-pay | Admitting: Nurse Practitioner

## 2015-10-11 DIAGNOSIS — Z79899 Other long term (current) drug therapy: Secondary | ICD-10-CM | POA: Insufficient documentation

## 2015-10-11 DIAGNOSIS — F17200 Nicotine dependence, unspecified, uncomplicated: Secondary | ICD-10-CM | POA: Insufficient documentation

## 2015-10-11 DIAGNOSIS — L0201 Cutaneous abscess of face: Secondary | ICD-10-CM | POA: Insufficient documentation

## 2015-10-11 DIAGNOSIS — L989 Disorder of the skin and subcutaneous tissue, unspecified: Secondary | ICD-10-CM | POA: Diagnosis present

## 2015-10-11 DIAGNOSIS — L0291 Cutaneous abscess, unspecified: Secondary | ICD-10-CM

## 2015-10-11 MED ORDER — SULFAMETHOXAZOLE-TRIMETHOPRIM 800-160 MG PO TABS: 1.0000 | ORAL_TABLET | Freq: Two times a day (BID) | ORAL | Status: AC

## 2015-10-11 MED ORDER — CEPHALEXIN 500 MG PO CAPS: 500.0000 mg | ORAL_CAPSULE | Freq: Two times a day (BID) | ORAL | Status: DC

## 2015-10-11 NOTE — Discharge Instructions (Signed)

## 2015-10-11 NOTE — ED Notes (Signed)
She c/o abscess to L outer eye onset yesterday. She applied warm compresses but the pain and swelling are getting worse

## 2015-10-11 NOTE — ED Provider Notes (Signed)
CSN: [id number]     Arrival date & time 10/11/15  1300 History  By signing my name below, I, Soijett Blue, attest that this documentation has been prepared under the direction and in the presence of Recardo Evangelist, PA-C Electronically Signed: Soijett Blue, ED Scribe. 10/11/2015. 2:15 PM.   Chief Complaint  Patient presents with  . Skin Problem   The history is provided by the patient. No language interpreter was used.   HPI Comments: Janice Adams is a 60 y.o. female who presents to the Emergency Department complaining of skin problem onset yesterday morning. Pt notes that her symptoms began as a small bump. Pt reports that since age 72, she has had recurrent abscesses that she has had I&D. She states that she is having associated symptoms of left outer eye swelling/pain and mild nausea. She states that she has tried warm compresses with no relief for her symptoms. She denies fever, eye pain, blurry vision, eye drainage, and any other symptoms. Denies PMHx of DM.   History reviewed. No pertinent past medical history. History reviewed. No pertinent past surgical history. Family History  Problem Relation Age of Onset  . Breast cancer Mother   . Breast cancer Maternal Grandmother   . Breast cancer Daughter    Social History  Substance Use Topics  . Smoking status: Current Some Day Smoker  . Smokeless tobacco: Never Used     Comment: Smoking 5 cigs/day  . Alcohol Use: No   OB History    No data available     Review of Systems  Constitutional: Negative for fever.  Eyes: Positive for pain (left outer ). Negative for visual disturbance.       Left outer eye swelling  Gastrointestinal: Positive for nausea (mild).  Skin:       Abscess to left outer eye    Allergies  Review of patient's allergies indicates no known allergies.  Home Medications   Prior to Admission medications   Medication Sig Start Date End Date Taking? Authorizing Provider  doxycycline (VIBRAMYCIN) 100 MG  capsule Take 1 capsule (100 mg total) by mouth 2 (two) times daily. 01/17/15   Hope Bunnie Pion, NP  HYDROcodone-acetaminophen (NORCO) 5-325 MG per tablet Take 1 tablet by mouth every 6 (six) hours as needed. 01/17/15   Hope Bunnie Pion, NP  omeprazole (PRILOSEC) 20 MG capsule TAKE 1 CAPSULE BY MOUTH TWICE DAILY WITH MEAL 06/15/15   Lance Bosch, NP   BP 125/82 mmHg  Pulse 102  Temp(Src) 98.4 F (36.9 C) (Oral)  Resp 17  SpO2 94%   Physical Exam  Constitutional: She is oriented to person, place, and time. She appears well-developed and well-nourished. No distress.  HENT:  Head: Normocephalic and atraumatic.  Eyes: Conjunctivae and EOM are normal. Pupils are equal, round, and reactive to light. Right eye exhibits no discharge. Left eye exhibits no discharge. Right conjunctiva is not injected. Left conjunctiva is not injected. No scleral icterus.  3x2 cm indurated abscess over left lateral temple. No fluctuance. No drainage. Mild redness and swelling over the lacrimal sac. No entrapment on EOM  Neck: Normal range of motion.  Cardiovascular: Normal rate.   Pulmonary/Chest: Effort normal. No respiratory distress.  Abdominal: She exhibits no distension.  Neurological: She is alert and oriented to person, place, and time.  Skin: Skin is warm and dry.  Psychiatric: She has a normal mood and affect. Her behavior is normal.  Nursing note and vitals reviewed.  ED Course  Procedures (  including critical care time) DIAGNOSTIC STUDIES: Oxygen Saturation is 94% on RA, adequate by my interpretation.    COORDINATION OF CARE: 2:15 PM Discussed treatment plan with pt at bedside which includes abx Rx and pt agreed to plan.  MDM   Final diagnoses:  Abscess   60 year old female who presents with abscess on face. She has a hx of recurrent abscesses. It is indurated - I do not believe I&D will be beneficial. Will cover for strep and MRSA with Keflex and Bactrim. Encouraged to continue warm compresses. She is  afebrile. Strict return precautions given since there is a small concern for cellulitis if infection progresses. Patient is NAD, non-toxic, with stable VS. Patient is informed of clinical course, understands medical decision making process, and agrees with plan. Opportunity for questions provided and all questions answered. Return precautions given.  I personally performed the services described in this documentation, which was scribed in my presence. The recorded information has been reviewed and is accurate.     Recardo Evangelist, PA-C 10/11/15 1648  Charlesetta Shanks, MD 10/12/15 [phone]

## 2015-10-11 NOTE — ED Notes (Signed)
Raquel James, PA, in w/pt.

## 2015-10-28 ENCOUNTER — Ambulatory Visit: Payer: Medicaid Other | Admitting: Family Medicine

## 2016-03-17 ENCOUNTER — Other Ambulatory Visit: Payer: Self-pay | Admitting: Internal Medicine

## 2016-05-11 ENCOUNTER — Other Ambulatory Visit: Payer: Self-pay

## 2016-05-11 ENCOUNTER — Encounter: Payer: Self-pay | Admitting: Family Medicine

## 2016-05-11 ENCOUNTER — Encounter: Payer: Self-pay | Admitting: Gastroenterology

## 2016-05-11 ENCOUNTER — Ambulatory Visit: Payer: Medicaid Other | Attending: Family Medicine | Admitting: Family Medicine

## 2016-05-11 VITALS — BP 138/83 | HR 93 | Temp 98.2°F | Ht 64.0 in | Wt 198.0 lb

## 2016-05-11 DIAGNOSIS — F17210 Nicotine dependence, cigarettes, uncomplicated: Secondary | ICD-10-CM | POA: Diagnosis not present

## 2016-05-11 DIAGNOSIS — R197 Diarrhea, unspecified: Secondary | ICD-10-CM | POA: Insufficient documentation

## 2016-05-11 DIAGNOSIS — K219 Gastro-esophageal reflux disease without esophagitis: Secondary | ICD-10-CM

## 2016-05-11 DIAGNOSIS — Z0000 Encounter for general adult medical examination without abnormal findings: Secondary | ICD-10-CM

## 2016-05-11 DIAGNOSIS — F17200 Nicotine dependence, unspecified, uncomplicated: Secondary | ICD-10-CM

## 2016-05-11 DIAGNOSIS — R1011 Right upper quadrant pain: Secondary | ICD-10-CM

## 2016-05-11 DIAGNOSIS — Z79899 Other long term (current) drug therapy: Secondary | ICD-10-CM | POA: Insufficient documentation

## 2016-05-11 DIAGNOSIS — R1013 Epigastric pain: Secondary | ICD-10-CM | POA: Diagnosis not present

## 2016-05-11 DIAGNOSIS — R079 Chest pain, unspecified: Secondary | ICD-10-CM | POA: Diagnosis not present

## 2016-05-11 DIAGNOSIS — B070 Plantar wart: Secondary | ICD-10-CM | POA: Diagnosis not present

## 2016-05-11 DIAGNOSIS — Z760 Encounter for issue of repeat prescription: Secondary | ICD-10-CM | POA: Diagnosis not present

## 2016-05-11 DIAGNOSIS — Z87898 Personal history of other specified conditions: Secondary | ICD-10-CM

## 2016-05-11 LAB — CBC WITH DIFFERENTIAL/PLATELET
BASOS PCT: 0 %
Basophils Absolute: 0 cells/uL (ref 0–200)
Eosinophils Absolute: 46 cells/uL (ref 15–500)
Eosinophils Relative: 1 %
HCT: 40 % (ref 35.0–45.0)
HEMOGLOBIN: 13.2 g/dL (ref 11.7–15.5)
LYMPHS ABS: 1472 {cells}/uL (ref 850–3900)
Lymphocytes Relative: 32 %
MCH: 32.5 pg (ref 27.0–33.0)
MCHC: 33 g/dL (ref 32.0–36.0)
MCV: 98.5 fL (ref 80.0–100.0)
MPV: 9.9 fL (ref 7.5–12.5)
Monocytes Absolute: 414 cells/uL (ref 200–950)
Monocytes Relative: 9 %
Neutro Abs: 2668 cells/uL (ref 1500–7800)
Neutrophils Relative %: 58 %
Platelets: 323 10*3/uL (ref 140–400)
RBC: 4.06 MIL/uL (ref 3.80–5.10)
RDW: 13.9 % (ref 11.0–15.0)
WBC: 4.6 10*3/uL (ref 3.8–10.8)

## 2016-05-11 LAB — HEPATIC FUNCTION PANEL
ALT: 12 U/L (ref 6–29)
AST: 14 U/L (ref 10–35)
Albumin: 4.3 g/dL (ref 3.6–5.1)
Alkaline Phosphatase: 106 U/L (ref 33–130)
BILIRUBIN DIRECT: 0.1 mg/dL (ref <=0.2)
BILIRUBIN INDIRECT: 0.4 mg/dL (ref 0.2–1.2)
Total Bilirubin: 0.5 mg/dL (ref 0.2–1.2)
Total Protein: 7.5 g/dL (ref 6.1–8.1)

## 2016-05-11 LAB — BASIC METABOLIC PANEL
BUN: 10 mg/dL (ref 7–25)
CALCIUM: 9.5 mg/dL (ref 8.6–10.4)
CO2: 25 mmol/L (ref 20–31)
Chloride: 104 mmol/L (ref 98–110)
Creat: 0.85 mg/dL (ref 0.50–0.99)
GLUCOSE: 87 mg/dL (ref 65–99)
POTASSIUM: 4.3 mmol/L (ref 3.5–5.3)
SODIUM: 139 mmol/L (ref 135–146)

## 2016-05-11 MED ORDER — OMEPRAZOLE 20 MG PO CPDR: 20.0000 mg | DELAYED_RELEASE_CAPSULE | Freq: Two times a day (BID) | ORAL | 1 refills | Status: DC

## 2016-05-11 MED ORDER — NICOTINE 21 MG/24HR TD PT24: 21.0000 mg | MEDICATED_PATCH | Freq: Every day | TRANSDERMAL | 1 refills | Status: DC

## 2016-05-11 NOTE — Progress Notes (Signed)
Subjective:  Patient ID: Janice Adams, female    DOB: [birth year]  Age: 61 y.o. MRN: [id number]  CC: Medication Refill  HPI Janice Adams presents for medication refill, c/o abdominal pain, and foot lesion. She report abdominal pain with a history of sporadic episodes of diarrhea for 1 month. She denies any bloody stool, no unintentional weight loss. Denies any difficulty eating or drinking. Reports relief of symptoms when taking Prilosec. She is a current smoker 1/2 pack per day. Reports CP while at rest 1 week ago. Denies any SOB.   Outpatient Medications Prior to Visit  Medication Sig Dispense Refill  . omeprazole (PRILOSEC) 20 MG capsule TAKE 1 CAPSULE BY MOUTH TWICE DAILY WITH MEAL 60 capsule 2  . cephALEXin (KEFLEX) 500 MG capsule Take 1 capsule (500 mg total) by mouth 2 (two) times daily. (Patient not taking: Reported on 05/11/2016) 20 capsule 0  . doxycycline (VIBRAMYCIN) 100 MG capsule Take 1 capsule (100 mg total) by mouth 2 (two) times daily. (Patient not taking: Reported on 05/11/2016) 14 capsule 0  . HYDROcodone-acetaminophen (NORCO) 5-325 MG per tablet Take 1 tablet by mouth every 6 (six) hours as needed. (Patient not taking: Reported on 05/11/2016) 15 tablet 0   No facility-administered medications prior to visit.    ROS Review of Systems  HENT: Negative for trouble swallowing.   Respiratory: Negative.   Cardiovascular: Negative.   Gastrointestinal: Positive for abdominal pain.  Skin:       Foot lesion.   Objective:  BP 138/83 (BP Location: Left Arm, Patient Position: Sitting, Cuff Size: Small)   Pulse 93   Temp 98.2 F (36.8 C) (Oral)   Ht '5\' 4"'$  (1.626 m)   Wt 198 lb (89.8 kg)   SpO2 91%   BMI 33.99 kg/m   BP/Weight 05/11/2016 10/11/2015 07/11/5972  Systolic BP 163 845 364  Diastolic BP 83 82 85  Wt. (Lbs) 198 - 191  BMI 33.99 - 32.77   Physical Exam  Constitutional: She appears well-developed and well-nourished.  HENT:  Nose: Nose normal.  Mouth/Throat:  Oropharynx is clear and moist.  Eyes: Pupils are equal, round, and reactive to light.  Cardiovascular: Normal rate, regular rhythm and normal heart sounds.   Pulmonary/Chest: Effort normal and breath sounds normal.  Abdominal: Soft. Bowel sounds are normal. She exhibits no mass. There is tenderness. There is no rebound.  Skin: Skin is warm and dry. Lesion (Plantar wart to posterior left foot.) noted.   Assessment & Plan:   Problem List Items Addressed This Visit      Digestive   GERD (gastroesophageal reflux disease) - Primary   Relevant Medications   omeprazole (PRILOSEC) 20 MG capsule   Other Relevant Orders   H. pylori breath test (Completed)     Other   Smoker   Relevant Medications   nicotine (NICODERM CQ - DOSED IN MG/24 HOURS) 21 mg/24hr patch    Other Visit Diagnoses    Plantar wart of left foot       Relevant Orders   Ambulatory referral to Dermatology   Abdominal pain, epigastric       Relevant Orders   Basic Metabolic Panel (Completed)   CBC with Differential (Completed)   Healthcare maintenance       Relevant Orders   Ambulatory referral to Gastroenterology   Abdominal pain, right upper quadrant       Relevant Orders   Hepatic Function Panel (Completed)   History of chest pain at rest             -  EKG performed in office.   Meds ordered this encounter  Medications  . omeprazole (PRILOSEC) 20 MG capsule    Sig: Take 1 capsule (20 mg total) by mouth 2 (two) times daily before a meal.    Dispense:  60 capsule    Refill:  1    Order Specific Question:   Supervising Provider    Answer:   Tresa Garter [id number]  . nicotine (NICODERM CQ - DOSED IN MG/24 HOURS) 21 mg/24hr patch    Sig: Place 1 patch (21 mg total) onto the skin daily.    Dispense:  28 patch    Refill:  1    Order Specific Question:   Supervising Provider    Answer:   Tresa Garter [id number]    Follow-up: Return in about 2 months (around 07/09/2016) for GERD .   Alfonse Spruce FNP

## 2016-05-11 NOTE — Progress Notes (Signed)
Pt is here today for refill on Prilosec. Pt also needs referral for podiatry.  Pt declined flu shot.

## 2016-05-11 NOTE — Patient Instructions (Signed)
Colonoscopy, Adult A colonoscopy is an exam to look at the large intestine. It is done to check for problems, such as:  Lumps (tumors).  Growths (polyps).  Swelling (inflammation).  Bleeding. What happens before the procedure? Eating and drinking Follow instructions from your doctor about eating and drinking. These instructions may include:  A few days before the procedure - follow a low-fiber diet.  Avoid nuts.  Avoid seeds.  Avoid dried fruit.  Avoid raw fruits.  Avoid vegetables.  1-3 days before the procedure - follow a clear liquid diet. Avoid liquids that have red or purple dye. Drink only clear liquids, such as:  Clear broth or bouillon.  Black coffee or tea.  Clear juice.  Clear soft drinks or sports drinks.  [place] desert.  Popsicles.  On the day of the procedure - do not eat or drink anything during the 2 hours before the procedure. Bowel prep If you were prescribed an oral bowel prep:  Take it as told by your doctor. Starting the day before your procedure, you will need to drink a lot of liquid. The liquid will cause you to poop (have bowel movements) until your poop is almost clear or light green.  If your skin or butt gets irritated from diarrhea, you may:  Wipe the area with wipes that have medicine in them, such as adult wet wipes with aloe and vitamin E.  Put something on your skin that soothes the area, such as petroleum jelly.  If you throw up (vomit) while drinking the bowel prep, take a break for up to 60 minutes. Then begin the bowel prep again. If you keep throwing up and you cannot take the bowel prep without throwing up, call your doctor. General instructions  Ask your doctor about changing or stopping your normal medicines. This is important if you take diabetes medicines or blood thinners.  Plan to have someone take you home from the hospital or clinic. What happens during the procedure?  An IV tube may be put into one of your  veins.  You will be given medicine to help you relax (sedative).  To reduce your risk of infection:  Your doctors will wash their hands.  Your anal area will be washed with soap.  You will be asked to lie on your side with your knees bent.  Your doctor will get a long, thin, flexible tube ready. The tube will have a camera and a light on the end.  The tube will be put into your anus.  The tube will be gently put into your large intestine.  Air will be delivered into your large intestine to keep it open. You may feel some pressure or cramping.  The camera will be used to take photos.  A small tissue sample may be removed from your body to be looked at under a microscope (biopsy). If any possible problems are found, the tissue will be sent to a lab for testing.  If small growths are found, your doctor may remove them and have them checked for cancer.  The tube that was put into your anus will be slowly removed. The procedure may vary among doctors and hospitals. What happens after the procedure?  Your doctor will check on you often until the medicines you were given have worn off.  Do not drive for 24 hours after the procedure.  You may have a small amount of blood in your poop.  You may pass gas.  You may have mild cramps  or bloating in your belly (abdomen).  It is up to you to get the results of your procedure. Ask your doctor, or the department performing the procedure, when your results will be ready. This information is not intended to replace advice given to you by your health care provider. Make sure you discuss any questions you have with your health care provider. Document Released: 05/28/2010 Document Revised: 12/31/2015 Document Reviewed: 07/07/2015 Elsevier Interactive Patient Education  2017 [place] for Gastroesophageal Reflux Disease, Adult When you have gastroesophageal reflux disease (GERD), the foods you eat and your eating habits are  very important. Choosing the right foods can help ease your discomfort. What guidelines do I need to follow?  Choose fruits, vegetables, whole grains, and low-fat dairy products.  Choose low-fat meat, fish, and poultry.  Limit fats such as oils, salad dressings, butter, nuts, and avocado.  Keep a food diary. This helps you identify foods that cause symptoms.  Avoid foods that cause symptoms. These may be different for everyone.  Eat small meals often instead of 3 large meals a day.  Eat your meals slowly, in a place where you are relaxed.  Limit fried foods.  Cook foods using methods other than frying.  Avoid drinking alcohol.  Avoid drinking large amounts of liquids with your meals.  Avoid bending over or lying down until 2-3 hours after eating. What foods are not recommended? These are some foods and drinks that may make your symptoms worse: Vegetables  Tomatoes. Tomato juice. Tomato and spaghetti sauce. Chili peppers. Onion and garlic. Horseradish. Fruits  Oranges, grapefruit, and lemon (fruit and juice). Meats  High-fat meats, fish, and poultry. This includes hot dogs, ribs, ham, sausage, salami, and bacon. Dairy  Whole milk and chocolate milk. Sour cream. Cream. Butter. Ice cream. Cream cheese. Drinks  Coffee and tea. Bubbly (carbonated) drinks or energy drinks. Condiments  Hot sauce. Barbecue sauce. Sweets/Desserts  Chocolate and cocoa. Donuts. Peppermint and spearmint. Fats and Oils  High-fat foods. This includes [place] fries and potato chips. Other  Vinegar. Strong spices. This includes black pepper, white pepper, red pepper, cayenne, curry powder, cloves, ginger, and chili powder. The items listed above may not be a complete list of foods and drinks to avoid. Contact your dietitian for more information.  This information is not intended to replace advice given to you by your health care provider. Make sure you discuss any questions you have with your health  care provider. Document Released: 10/25/2011 Document Revised: 10/01/2015 Document Reviewed: 02/27/2013 Elsevier Interactive Patient Education  2017 [employer].

## 2016-05-12 LAB — H. PYLORI BREATH TEST: H. PYLORI BREATH TEST: NOT DETECTED

## 2016-05-13 ENCOUNTER — Encounter: Payer: Self-pay | Admitting: Family Medicine

## 2016-05-17 ENCOUNTER — Telehealth: Payer: Self-pay

## 2016-05-17 NOTE — Telephone Encounter (Signed)
-----   Message from Alfonse Spruce, Newborn sent at 05/16/2016  1:46 PM EST ----- -All labs were normal. -Liver function is normal. -Follow up as needed.

## 2016-05-17 NOTE — Telephone Encounter (Signed)
Patient Verify DOB  Patient was aware and understood that all her labs were normal and just to F/Up as needed

## 2016-07-05 ENCOUNTER — Ambulatory Visit (AMBULATORY_SURGERY_CENTER): Payer: Self-pay

## 2016-07-05 VITALS — Ht 64.0 in | Wt 197.0 lb

## 2016-07-05 DIAGNOSIS — R1013 Epigastric pain: Secondary | ICD-10-CM

## 2016-07-05 DIAGNOSIS — G8929 Other chronic pain: Secondary | ICD-10-CM

## 2016-07-05 MED ORDER — NA SULFATE-K SULFATE-MG SULF 17.5-3.13-1.6 GM/177ML PO SOLN: ORAL | 0 refills | Status: DC

## 2016-07-05 NOTE — Progress Notes (Signed)
Per pt, no allergies to soy or egg products.Pt not taking any weight loss meds or using  O2 at home.  Pt came into the office today for her pre-visit prior to her colon on 07/19/16. Pt states she is having upper epigastric pain on and off. The pt will discuss this with her PCP on 07/09/16 and schedule an endoscopy   with her colon, if necessary. She will contact our office after her appointment with Dr Braulio Conte to reschedule her appointment, if necessary.

## 2016-07-19 ENCOUNTER — Encounter: Payer: Medicaid Other | Admitting: Gastroenterology

## 2016-07-19 ENCOUNTER — Telehealth: Payer: Self-pay | Admitting: Gastroenterology

## 2016-07-19 NOTE — Telephone Encounter (Signed)
Okay thanks for letting me know, we can reschedule at her convenience.

## 2016-08-30 ENCOUNTER — Other Ambulatory Visit: Payer: Self-pay | Admitting: Internal Medicine

## 2016-08-30 DIAGNOSIS — Z1231 Encounter for screening mammogram for malignant neoplasm of breast: Secondary | ICD-10-CM

## 2016-09-14 ENCOUNTER — Ambulatory Visit: Payer: Medicaid Other

## 2016-09-21 ENCOUNTER — Inpatient Hospital Stay: Admission: RE | Admit: 2016-09-21 | Payer: Medicaid Other | Source: Ambulatory Visit

## 2016-10-18 ENCOUNTER — Ambulatory Visit
Admission: RE | Admit: 2016-10-18 | Discharge: 2016-10-18 | Disposition: A | Discharge status: Home / Self Care | Payer: Medicaid Other | Source: Ambulatory Visit | Attending: Family Medicine | Admitting: Family Medicine

## 2016-10-18 DIAGNOSIS — Z1231 Encounter for screening mammogram for malignant neoplasm of breast: Secondary | ICD-10-CM

## 2017-07-10 ENCOUNTER — Ambulatory Visit (INDEPENDENT_AMBULATORY_CARE_PROVIDER_SITE_OTHER): Payer: Self-pay | Admitting: Physician Assistant

## 2018-03-06 ENCOUNTER — Other Ambulatory Visit: Payer: Self-pay

## 2018-03-06 ENCOUNTER — Other Ambulatory Visit (HOSPITAL_COMMUNITY)
Admission: RE | Admit: 2018-03-06 | Discharge: 2018-03-06 | Disposition: A | Discharge status: Home / Self Care | Payer: Medicaid Other | Source: Ambulatory Visit | Attending: Family Medicine | Admitting: Family Medicine

## 2018-03-06 ENCOUNTER — Ambulatory Visit: Payer: Medicaid Other | Attending: Family Medicine | Admitting: Family Medicine

## 2018-03-06 ENCOUNTER — Encounter: Payer: Self-pay | Admitting: Family Medicine

## 2018-03-06 VITALS — BP 175/102 | HR 109 | Temp 99.2°F | Resp 20 | Ht 64.0 in | Wt 183.2 lb

## 2018-03-06 DIAGNOSIS — R350 Frequency of micturition: Secondary | ICD-10-CM

## 2018-03-06 DIAGNOSIS — R1013 Epigastric pain: Secondary | ICD-10-CM

## 2018-03-06 DIAGNOSIS — H6982 Other specified disorders of Eustachian tube, left ear: Secondary | ICD-10-CM | POA: Diagnosis not present

## 2018-03-06 DIAGNOSIS — R8290 Unspecified abnormal findings in urine: Secondary | ICD-10-CM | POA: Diagnosis not present

## 2018-03-06 DIAGNOSIS — R000 Tachycardia, unspecified: Secondary | ICD-10-CM | POA: Diagnosis not present

## 2018-03-06 DIAGNOSIS — Z803 Family history of malignant neoplasm of breast: Secondary | ICD-10-CM | POA: Diagnosis not present

## 2018-03-06 DIAGNOSIS — N898 Other specified noninflammatory disorders of vagina: Secondary | ICD-10-CM

## 2018-03-06 DIAGNOSIS — R42 Dizziness and giddiness: Secondary | ICD-10-CM | POA: Diagnosis not present

## 2018-03-06 DIAGNOSIS — M109 Gout, unspecified: Secondary | ICD-10-CM

## 2018-03-06 DIAGNOSIS — I10 Essential (primary) hypertension: Secondary | ICD-10-CM | POA: Diagnosis not present

## 2018-03-06 DIAGNOSIS — F17210 Nicotine dependence, cigarettes, uncomplicated: Secondary | ICD-10-CM | POA: Diagnosis not present

## 2018-03-06 DIAGNOSIS — K219 Gastro-esophageal reflux disease without esophagitis: Secondary | ICD-10-CM | POA: Diagnosis not present

## 2018-03-06 DIAGNOSIS — Z1211 Encounter for screening for malignant neoplasm of colon: Secondary | ICD-10-CM

## 2018-03-06 DIAGNOSIS — R1030 Lower abdominal pain, unspecified: Secondary | ICD-10-CM

## 2018-03-06 DIAGNOSIS — F17200 Nicotine dependence, unspecified, uncomplicated: Secondary | ICD-10-CM

## 2018-03-06 DIAGNOSIS — R197 Diarrhea, unspecified: Secondary | ICD-10-CM

## 2018-03-06 DIAGNOSIS — H6992 Unspecified Eustachian tube disorder, left ear: Secondary | ICD-10-CM

## 2018-03-06 DIAGNOSIS — Z1239 Encounter for other screening for malignant neoplasm of breast: Secondary | ICD-10-CM

## 2018-03-06 LAB — POCT URINALYSIS DIP (CLINITEK)
Bilirubin, UA: NEGATIVE
Glucose, UA: NEGATIVE mg/dL
Ketones, POC UA: NEGATIVE mg/dL
Leukocytes, UA: NEGATIVE
Nitrite, UA: NEGATIVE
POC,PROTEIN,UA: NEGATIVE
Spec Grav, UA: 1.025
Urobilinogen, UA: 0.2 U/dL
pH, UA: 5.5

## 2018-03-06 MED ORDER — INDOMETHACIN 50 MG PO CAPS: 50.0000 mg | ORAL_CAPSULE | Freq: Two times a day (BID) | ORAL | 1 refills | Status: DC

## 2018-03-06 MED ORDER — FLUCONAZOLE 150 MG PO TABS: 150.0000 mg | ORAL_TABLET | Freq: Once | ORAL | 0 refills | Status: AC

## 2018-03-06 MED ORDER — AMLODIPINE BESYLATE 5 MG PO TABS: 5.0000 mg | ORAL_TABLET | Freq: Every day | ORAL | 3 refills | Status: DC

## 2018-03-06 NOTE — Progress Notes (Signed)
Flu shot: no Pain: gout in right ankle, yesterday, 9  Runny nose on and off, sweating, a while  Requested mammogram  Ear itching in left ear 99.2   Acid reflux pills  Patient complained of an itch and odor in vaginal area, patient stated it has been present for about a week. Patient stated she tried a monistat  but all symptoms reappeared.

## 2018-03-06 NOTE — Progress Notes (Signed)
Subjective:    Patient ID: Janice Adams, female    DOB: 12/13/1955, 62 y.o.   MRN: [id number]  HPI 62 year old female who was last seen here in this office in January 2018 who presents to reestablish care.  Patient with multiple complaints at today's visit including recent gout flareup in the right posterior heel which started on Sunday.  Patient states that the pain is currently an 8-9 on a 0-to-10 scale but on Sunday, the pain was a 10.  Patient states that the pain consist of an aching sensation and she had difficulty walking secondary to the pain for the past 2 days but this is now improved.  Patient also with complaint of a tingling sensation in the left ear which is occurred off and on for couple of days.  Patient also would like to have her mammogram scheduled as she has a family history of both her mother and maternal grandmother having breast cancer.  Patient also with complaint of acid reflux symptoms including mid upper abdominal discomfort and patient needs refill of omeprazole.  Patient also with complaint of lower abdominal discomfort as well as urinary frequency.  Patient additionally with complaint of vaginal itching.  Patient did try OTC Monistat which helped while she was using it but then the symptoms returned.  Patient states that she has not been sexually active in the past 5 to 6 years.  Patient denies vaginal /pelvic pain.  Patient also has had issues with recurrent diarrhea.  Patient has noticed some blood on the toilet tissue with wiping after having had diarrhea.  Patient has had no prior colonoscopy.  Patient has had urinary frequency without dysuria.  Patient reports no history of hypertension and was surprised that her blood pressure was elevated at today's visit.  Patient denies headaches but patient has had some recurrent dizziness.      Patient reports that her only surgery has been a tubal ligation.  Patient does smoke approximately 1/2 pack/day of cigarettes.  Patient has  occasional social use of alcohol.  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     Past Medical History:  Diagnosis Date  . GERD (gastroesophageal reflux disease)   . History of epigastric pain    comes and goes   Family History  Problem Relation Age of Onset  . Breast cancer Mother   . Cancer Mother   . Seizures Father   . Breast cancer Maternal Grandmother   . Breast cancer Daughter   . Cancer Daughter    Social History   Tobacco Use  . Smoking status: Current Some Day Smoker    Packs/day: 0.50    Types: Cigarettes  . Smokeless tobacco: Never Used  . Tobacco comment: Smoking 5 cigs/day  Substance Use Topics  . Alcohol use: No    Alcohol/week: 0.0 standard drinks  . Drug use: No    Types: Marijuana  No Known Allergies  Review of Systems  Constitutional: Positive for fatigue. Negative for chills and fever.  HENT: Positive for congestion, ear pain, postnasal drip and rhinorrhea. Negative for sinus pressure, sinus pain, sore throat and trouble swallowing.   Respiratory: Negative for cough and shortness of breath.   Cardiovascular: Negative for chest pain, palpitations and leg swelling.  Gastrointestinal: Positive for abdominal pain and diarrhea. Negative for blood in stool (Patient denies any blood in the stool but has noticed some blood on the tissue when wiping after having diarrhea) and nausea.  Genitourinary: Positive for frequency and vaginal discharge. Negative for dysuria, pelvic pain, vaginal bleeding and vaginal pain.  Neurological: Positive for dizziness. Negative for headaches.       Objective:   Physical Exam BP (!) 175/102 (BP Location: Right Arm, Cuff Size: Normal)   Pulse (!) 109   Temp 99.2 F (37.3 C) (Oral)   Resp 20   Ht 5\' 4"  (1.626 m)   Wt 183 lb 3.2 oz (83.1 kg)   SpO2 96%   BMI 31.45 kg/m Vital signs and nurse's notes reviewed General-well-nourished, well-developed older female in no acute  distress ENT- right TM normal, left TM slightly dull, patient with edema/erythema of the nasal turbinates, mild posterior pharynx erythema, patient has upper and lower dentures Neck-supple, no lymphadenopathy, no thyromegaly, no carotid bruit, patient does have a pounding pulse Cardiovascular-regular rate (mild tachycardia) and rhythm Lungs-clear to auscultation bilaterally Abdomen- patient with mild truncal obesity, abdomen is distended, patient with positive epigastric tenderness on exam, no rebound or guarding, patient with generalized lower abdominal discomfort/suprapubic discomfort, no rebound or guarding Back-no CVA tenderness Extremities-no edema  Musculoskeletal- very mild discomfort to palpation over the right posterior heel/Achilles tendon insertion site       Assessment & Plan:  1. Essential hypertension Patient's blood pressure is elevated at today's visit.  Patient will be started on amlodipine 5 mg daily.  Also discussed DASH diet.  Patient will also have a lipid panel, BMP and CBC - Basic Metabolic Panel - CBC with Differential - amLODipine (NORVASC) 5 MG tablet; Take 1 tablet (5 mg total) by mouth daily. To lower blood pressure  Dispense: 30 tablet; Refill: 3 - Lipid panel  2. Dizziness Patient with complaint of dizziness and patient does have elevated blood pressure today's visit and will be started on blood pressure medication.  Patient will also have BMP and CBC done in follow-up of dizziness to look for electrolyte abnormality or anemia as a cause of her dizziness - Basic Metabolic Panel  3. Lower abdominal pain Patient with complaint of recent issues with recurrent diarrhea and patient with lower abdominal pain.  Patient is also seen some blood with wiping after bowel movement.  Patient will have referral to gastroenterology as she has not had a prior screening colonoscopy.  Patient will also have CBC to look for blood loss. - CBC with Differential - Ambulatory  referral to Gastroenterology  4. Epigastric pain Patient with epigastric pain.  Patient reports a history of reflux and will be restarted on omeprazole.  Patient will also have CBC to look for blood loss and lipase to look for elevated pancreatic enzyme/possible pancreatitis.  Patient will also be referred to GI for further evaluation and treatment. - CBC with Differential - Ambulatory referral to Gastroenterology - Lipase  5. Acute gout of right ankle, unspecified cause Patient believes that she may have had a recent flare of gout which caused pain in her right posterior ankle/Achilles tendon area.  Patient will have uric acid level to see if patient may need medication such as colchicine or allopurinol.  Patient given prescription for indomethacin to take as needed for pain and patient was told that she needs to make sure that she eats prior to taking this medication to avoid further stomach upset - Uric Acid - indomethacin (INDOCIN) 50 MG capsule; Take 1 capsule (50 mg total) by mouth 2 (two) times daily with a meal. As needed for gout pain  Dispense: 30 capsule; Refill: 1  6. Eustachian tube dysfunction,  left Patient is encouraged to take an over-the-counter antihistamine to help with left ear discomfort which is likely secondary to nasal congestion and eustachian tube dysfunction  7. Gastroesophageal reflux disease, esophagitis presence not specified Patient provided with refill of omeprazole and will also be referred to gastroenterology secondary to epigastric pain and reflux symptoms.  Patient is also encouraged to avoid late night eating and avoid known trigger foods - Ambulatory referral to Gastroenterology  8. Diarrhea, unspecified type Patient reports recurrent issues with diarrhea.  Patient will have LFTs to check for liver or gallbladder dysfunction.  Patient will also have BMP today to look for any possible electrolyte abnormality secondary to recurrent diarrhea.  Patient is  encouraged to have yogurt with active cultures or take an over-the-counter probiotic.  Patient may also use over-the-counter Imodium as needed.  Patient will also have referral to GI for further evaluation and treatment - Ambulatory referral to Gastroenterology - Hepatic Function Panel  9. Urinary frequency Patient with complaint of urinary frequency and patient will have UA to look for urinary tract infection none none - POCT URINALYSIS DIP (CLINITEK)  10. Vaginal itching Patient with complaint of vaginal itching and patient states that the symptoms feel similar to prior yeast infections.  Patient will have urine cytology at today's visit and will be notified if any further treatment is needed based on these results. - Urine cytology ancillary only - fluconazole (DIFLUCAN) 150 MG tablet; Take 1 tablet (150 mg total) by mouth once for 1 dose.  Dispense: 1 tablet; Refill: 0  11. Tobacco dependence Discussed the need for smoking cessation with the patient to help improve her overall health.  Discussed with the patient that she may wish to make an appointment with the clinical pharmacist to further discuss smoking cessation when she feels that she is ready to pick a quit date and move towards smoking cessation   12. Family history of breast cancer in mother Patient with a family history of breast cancer in her mother and grandmother.  Patient is being referred for screening mammogram  13. Screening for breast cancer Patient is being referred for screening mammogram - MM Digital Screening; Future  14. Screening for colon cancer Patient is being referred to gastroenterology for her screening colonoscopy but patient has also had issues with diarrhea and is noticed some blood on the tissue with wiping after diarrhea but this could be secondary to skin irritation from the diarrhea. - Ambulatory referral to Gastroenterology  15. Tachycardia : Patient with tachycardia at today's visit and will have  T4 TSH to look for thyroid disorder which may be contributing.  Patient will also have BMP to check electrolytes as well as CBC to look for anemia as a possible cause of her tachycardia - T4 AND TSH  16. Abnormal urinalysis Patient with abnormal findings on urinalysis and urine will be sent for culture and patient will be notified if further treatment is needed based on the urine culture results - Urine Culture  *influenza immunization offered but declined by patient at today's visit but patient also with a low grade temp and may return for immunization at an upcoming flu clinic  An After Visit Summary was printed and given to the patient.  Return in about 4 weeks (around 04/03/2018) for HTN- 2 weeks with Lurena Joiner and 4 weeks PCP.

## 2018-03-07 LAB — T4 AND TSH
T4, Total: 9 ug/dL (ref 4.5–12.0)
TSH: 2.45 u[IU]/mL (ref 0.450–4.500)

## 2018-03-07 LAB — CBC WITH DIFFERENTIAL/PLATELET
Basophils Absolute: 0 x10E3/uL (ref 0.0–0.2)
Basos: 1 %
EOS (ABSOLUTE): 0 x10E3/uL (ref 0.0–0.4)
Eos: 1 %
Hematocrit: 41.8 % (ref 34.0–46.6)
Hemoglobin: 13.9 g/dL (ref 11.1–15.9)
Immature Grans (Abs): 0 x10E3/uL (ref 0.0–0.1)
Immature Granulocytes: 0 %
Lymphocytes Absolute: 1.7 x10E3/uL (ref 0.7–3.1)
Lymphs: 33 %
MCH: 31.3 pg (ref 26.6–33.0)
MCHC: 33.3 g/dL (ref 31.5–35.7)
MCV: 94 fL (ref 79–97)
Monocytes Absolute: 0.3 x10E3/uL (ref 0.1–0.9)
Monocytes: 7 %
Neutrophils Absolute: 3 x10E3/uL (ref 1.4–7.0)
Neutrophils: 58 %
Platelets: 337 x10E3/uL (ref 150–450)
RBC: 4.44 x10E6/uL (ref 3.77–5.28)
RDW: 12.3 % (ref 12.3–15.4)
WBC: 5.1 x10E3/uL (ref 3.4–10.8)

## 2018-03-07 LAB — HEPATIC FUNCTION PANEL
ALT: 10 IU/L (ref 0–32)
AST: 17 IU/L (ref 0–40)
Albumin: 4.5 g/dL (ref 3.6–4.8)
Alkaline Phosphatase: 95 IU/L (ref 39–117)
Bilirubin Total: 0.5 mg/dL (ref 0.0–1.2)
Bilirubin, Direct: 0.13 mg/dL (ref 0.00–0.40)
Total Protein: 7.5 g/dL (ref 6.0–8.5)

## 2018-03-07 LAB — BASIC METABOLIC PANEL WITH GFR
BUN/Creatinine Ratio: 9 — ABNORMAL LOW (ref 12–28)
BUN: 8 mg/dL (ref 8–27)
CO2: 23 mmol/L (ref 20–29)
Calcium: 10 mg/dL (ref 8.7–10.3)
Chloride: 104 mmol/L (ref 96–106)
Creatinine, Ser: 0.9 mg/dL (ref 0.57–1.00)
GFR calc Af Amer: 79 mL/min/1.73
GFR calc non Af Amer: 68 mL/min/1.73
Glucose: 88 mg/dL (ref 65–99)
Potassium: 4.1 mmol/L (ref 3.5–5.2)
Sodium: 146 mmol/L — ABNORMAL HIGH (ref 134–144)

## 2018-03-07 LAB — URINE CYTOLOGY ANCILLARY ONLY
Chlamydia: NEGATIVE
Neisseria Gonorrhea: NEGATIVE
Trichomonas: NEGATIVE

## 2018-03-07 LAB — URIC ACID: Uric Acid: 5.4 mg/dL (ref 2.5–7.1)

## 2018-03-07 LAB — LIPASE: Lipase: 13 U/L — ABNORMAL LOW (ref 14–72)

## 2018-03-07 LAB — LIPID PANEL
Chol/HDL Ratio: 4 ratio (ref 0.0–4.4)
Cholesterol, Total: 218 mg/dL — ABNORMAL HIGH (ref 100–199)
HDL: 55 mg/dL
LDL Calculated: 137 mg/dL — ABNORMAL HIGH (ref 0–99)
Triglycerides: 130 mg/dL (ref 0–149)
VLDL Cholesterol Cal: 26 mg/dL (ref 5–40)

## 2018-03-08 LAB — URINE CULTURE

## 2018-03-09 LAB — URINE CYTOLOGY ANCILLARY ONLY: Candida vaginitis: NEGATIVE

## 2018-03-14 ENCOUNTER — Other Ambulatory Visit: Payer: Self-pay | Admitting: Family Medicine

## 2018-03-14 DIAGNOSIS — B9689 Other specified bacterial agents as the cause of diseases classified elsewhere: Secondary | ICD-10-CM

## 2018-03-14 DIAGNOSIS — N760 Acute vaginitis: Principal | ICD-10-CM

## 2018-03-14 MED ORDER — OMEPRAZOLE 20 MG PO CPDR: 20.0000 mg | DELAYED_RELEASE_CAPSULE | Freq: Two times a day (BID) | ORAL | 5 refills | Status: DC

## 2018-03-14 MED ORDER — METRONIDAZOLE 500 MG PO TABS: 500.0000 mg | ORAL_TABLET | Freq: Two times a day (BID) | ORAL | 0 refills | Status: DC

## 2018-03-14 NOTE — Progress Notes (Signed)
Patient ID: Janice Adams, female   DOB: 05-27-1955, 62 y.o.   MRN: [id number]   Patient with evidence of bacterial vaginitis on recent urine cytology.  Prescription will be sent to patient's pharmacy for metronidazole for treatment.  Patient will be notified by CMA.

## 2018-03-16 ENCOUNTER — Telehealth: Payer: Self-pay | Admitting: *Deleted

## 2018-03-16 NOTE — Telephone Encounter (Signed)
Patients only contact was not in service. Communication letter has been sent.

## 2018-03-16 NOTE — Telephone Encounter (Signed)
-----   Message from Antony Blackbird, MD sent at 03/14/2018 11:40 PM EST ----- Please notify patient that her urine culture showed less than 10,000 colonies of bacterial growth which is considered to be not clinically significant.  Patient did have evidence of bacterial vaginitis and a prescription will be sent to her pharmacy for metronidazole for treatment

## 2018-03-20 ENCOUNTER — Ambulatory Visit: Payer: Medicaid Other | Admitting: Pharmacist

## 2018-03-20 NOTE — Progress Notes (Deleted)
   S:    PCP: Dr. Chapman Fitch  Patient arrives ***.    Presents to the clinic for hypertension evaluation, counseling, and management. Patient was referred and last seen by Dr. Chapman Fitch on 03/06/18. BP at that visit initially 175/102. Repeat BP 158/100. Started on amlodipine 5 mg daily.   Patient {Actions; denies-reports:120008} adherence with medications. NKDA  Current BP Medications include:   - amlodipine 5 mg daily  Antihypertensives tried in the past include:  - none  Dietary habits include: *** Exercise habits include:*** Family / Social history: ***  ASCVD risk factors include:***  Home BP readings: ***   .[email or web address]   O:  Physical Exam   ROS  Last 3 Office BP readings: BP Readings from Last 3 Encounters:  03/06/18 (!) 175/102  05/11/16 138/83  10/11/15 127/82    BMET    Component Value Date/Time   NA 146 (H) 03/06/2018 1128   K 4.1 03/06/2018 1128   CL 104 03/06/2018 1128   CO2 23 03/06/2018 1128   GLUCOSE 88 03/06/2018 1128   GLUCOSE 87 05/11/2016 1157   BUN 8 03/06/2018 1128   CREATININE 0.90 03/06/2018 1128   CREATININE 0.85 05/11/2016 1157   CALCIUM 10.0 03/06/2018 1128   GFRNONAA 68 03/06/2018 1128   GFRNONAA 73 11/08/2012 1212   GFRAA 79 03/06/2018 1128   GFRAA 84 11/08/2012 1212    Renal function: Estimated Creatinine Clearance: 66.8 mL/min (by C-G formula based on SCr of 0.9 mg/dL).  A/P: Hypertension longstanding/newly diagnosed currently *** on current medications. BP Goal = *** mmHg. Patient {Is/is not:9024} adherent with current medications.  -{Meds adjust:18428} ***.  -F/u labs ordered - *** -Counseled on lifestyle modifications for blood pressure control including reduced dietary sodium, increased exercise, adequate sleep  Results reviewed and written information provided.   Total time in face-to-face counseling *** minutes.   F/U Clinic Visit in ***.  Patient seen with ***

## 2018-03-22 ENCOUNTER — Encounter: Payer: Self-pay | Admitting: Family Medicine

## 2018-04-03 ENCOUNTER — Ambulatory Visit: Payer: Medicaid Other | Admitting: Family Medicine

## 2019-02-14 ENCOUNTER — Telehealth: Payer: Self-pay | Admitting: Family Medicine

## 2019-02-14 NOTE — Telephone Encounter (Signed)
Patient called wanting to get prescribed something for gout. Please follow up.

## 2019-02-15 NOTE — Telephone Encounter (Signed)
Please find out more information regarding patient's gout including location, symptoms- she has not been here in awhile and needs follow-up so also ask her to schedule office visit in follow-up of her chronic issues as well

## 2019-02-19 NOTE — Telephone Encounter (Signed)
LMOM

## 2019-02-26 NOTE — Telephone Encounter (Signed)
LMOM

## 2019-03-04 ENCOUNTER — Telehealth: Payer: Self-pay | Admitting: Family Medicine

## 2019-03-04 DIAGNOSIS — M109 Gout, unspecified: Secondary | ICD-10-CM

## 2019-03-04 MED ORDER — INDOMETHACIN 50 MG PO CAPS: 50.0000 mg | ORAL_CAPSULE | Freq: Two times a day (BID) | ORAL | 0 refills | Status: DC

## 2019-03-04 NOTE — Telephone Encounter (Signed)
1) Medication(s) Requested (by name): -indomethacin (INDOCIN) 50 MG capsule   2) Pharmacy of Choice: -[facility], [place] - [address] AT [address]  3) Special Requests: Until her appt on 03/15/2019

## 2019-03-06 NOTE — Telephone Encounter (Signed)
LMOM

## 2019-03-15 ENCOUNTER — Ambulatory Visit: Payer: Medicaid Other | Attending: Family Medicine | Admitting: Family Medicine

## 2019-03-15 ENCOUNTER — Encounter: Payer: Self-pay | Admitting: Family Medicine

## 2019-03-15 ENCOUNTER — Other Ambulatory Visit: Payer: Self-pay

## 2019-03-15 ENCOUNTER — Other Ambulatory Visit (HOSPITAL_COMMUNITY)
Admission: RE | Admit: 2019-03-15 | Discharge: 2019-03-15 | Disposition: A | Discharge status: Home / Self Care | Payer: Medicaid Other | Source: Ambulatory Visit | Attending: Family Medicine | Admitting: Family Medicine

## 2019-03-15 VITALS — BP 130/84 | HR 89 | Temp 98.3°F | Ht 64.0 in | Wt 193.2 lb

## 2019-03-15 DIAGNOSIS — N898 Other specified noninflammatory disorders of vagina: Secondary | ICD-10-CM | POA: Diagnosis present

## 2019-03-15 DIAGNOSIS — K219 Gastro-esophageal reflux disease without esophagitis: Secondary | ICD-10-CM

## 2019-03-15 DIAGNOSIS — Z1231 Encounter for screening mammogram for malignant neoplasm of breast: Secondary | ICD-10-CM

## 2019-03-15 DIAGNOSIS — Z124 Encounter for screening for malignant neoplasm of cervix: Secondary | ICD-10-CM

## 2019-03-15 DIAGNOSIS — Z780 Asymptomatic menopausal state: Secondary | ICD-10-CM

## 2019-03-15 MED ORDER — FLUCONAZOLE 150 MG PO TABS: 150.0000 mg | ORAL_TABLET | Freq: Once | ORAL | 0 refills | Status: AC

## 2019-03-15 MED ORDER — OMEPRAZOLE 20 MG PO CPDR: 20.0000 mg | DELAYED_RELEASE_CAPSULE | Freq: Two times a day (BID) | ORAL | 5 refills | Status: DC

## 2019-03-15 NOTE — Progress Notes (Signed)
Vaginal Itch   Re-establish care

## 2019-03-15 NOTE — Progress Notes (Signed)
Established Patient Office Visit  Subjective:  Patient ID: Janice Adams, female    DOB: Nov 17, 1955  Age: 63 y.o. MRN: [id number]  CC: vaginal itching  HPI Janice Adams, 63 yo female, who was last seen in the office on 03/06/2018, who presents to re-establish care and has complaint at today's visit of vaginal discharge and vaginal itching. Vaginal discharge is whitish and has an unpleasant odor and has been present for a few weeks.  She denies any abdominal or pelvic pain. She cannot recall when she last had a pap smear. She does not recall any history of abnormal pap smears. She has had a mammogram about 2 years ago. She does not think that she has ever had a bone density scan. She is postmenopausal. Colonoscopy is not up to date but she does not want to scheduled for colonoscopy at this time.          She also needs refills of medication for treatment of acid reflux. She does have some occasional reflux symptoms when she eats late at night. Her reflux symptoms are controlled with the use of medication. No sore throat or difficulty swallowing. No blood in the stools or black stools. She has some occasional fatigue. No chest pain or palpitations. Occasional SOB with exertion. Still smokes about 5 cigarettes per day as she feels that smoking also helps with stress. She would like to stop at some point but she thinks that things are still too stressful due to the [place] pandemic.   Past Medical History:  Diagnosis Date  . GERD (gastroesophageal reflux disease)   . History of epigastric pain    comes and goes    Past Surgical History:  Procedure Laterality Date  . NO PAST SURGERIES      Family History  Problem Relation Age of Onset  . Breast cancer Mother   . Cancer Mother   . Seizures Father   . Breast cancer Maternal Grandmother   . Breast cancer Daughter   . Cancer Daughter     Social History   Socioeconomic History  . Marital status: Divorced    Spouse name: Not on file    . Number of children: 5  . Years of education: Not on file  . Highest education level: Not on file  Occupational History  . Occupation: Childcare  Social Needs  . Financial resource strain: Not on file  . Food insecurity    Worry: Not on file    Inability: Not on file  . Transportation needs    Medical: Not on file    Non-medical: Not on file  Tobacco Use  . Smoking status: Current Some Day Smoker    Packs/day: 0.50    Types: Cigarettes  . Smokeless tobacco: Never Used  . Tobacco comment: Smoking 5 cigs/day  Substance and Sexual Activity  . Alcohol use: No    Alcohol/week: 0.0 standard drinks  . Drug use: No    Types: Marijuana  . Sexual activity: Not on file  Lifestyle  . Physical activity    Days per week: Not on file    Minutes per session: Not on file  . Stress: Not on file  Relationships  . Social Herbalist on phone: Not on file    Gets together: Not on file    Attends religious service: Not on file    Active member of club or organization: Not on file    Attends meetings of clubs or organizations: Not on  file    Relationship status: Not on file  . Intimate partner violence    Fear of current or ex partner: Not on file    Emotionally abused: Not on file    Physically abused: Not on file    Forced sexual activity: Not on file  Other Topics Concern  . Not on file  Social History Narrative  . Not on file    Outpatient Medications Prior to Visit  Medication Sig Dispense Refill  . amLODipine (NORVASC) 5 MG tablet Take 1 tablet (5 mg total) by mouth daily. To lower blood pressure 30 tablet 3  . indomethacin (INDOCIN) 50 MG capsule Take 1 capsule (50 mg total) by mouth 2 (two) times daily with a meal. As needed for gout pain 30 capsule 0  . omeprazole (PRILOSEC) 20 MG capsule Take 1 capsule (20 mg total) by mouth 2 (two) times daily before a meal. To lower stomach acid 60 capsule 5  . cephALEXin (KEFLEX) 500 MG capsule Take 1 capsule (500 mg total) by  mouth 2 (two) times daily. (Patient not taking: Reported on 05/11/2016) 20 capsule 0  . doxycycline (VIBRAMYCIN) 100 MG capsule Take 1 capsule (100 mg total) by mouth 2 (two) times daily. (Patient not taking: Reported on 05/11/2016) 14 capsule 0  . HYDROcodone-acetaminophen (NORCO) 5-325 MG per tablet Take 1 tablet by mouth every 6 (six) hours as needed. (Patient not taking: Reported on 05/11/2016) 15 tablet 0  . metroNIDAZOLE (FLAGYL) 500 MG tablet Take 1 tablet (500 mg total) by mouth 2 (two) times daily. (Patient not taking: Reported on 03/15/2019) 14 tablet 0  . Na Sulfate-K Sulfate-Mg Sulf (SUPREP BOWEL PREP KIT) 17.5-3.13-1.6 GM/180ML SOLN Suprep as directed / no substitutions (Patient not taking: Reported on 03/06/2018) 354 mL 0  . nicotine (NICODERM CQ - DOSED IN MG/24 HOURS) 21 mg/24hr patch Place 1 patch (21 mg total) onto the skin daily. (Patient not taking: Reported on 07/05/2016) 28 patch 1   No facility-administered medications prior to visit.     No Known Allergies  ROS Review of Systems  Constitutional: Positive for fatigue (occasional, mild). Negative for chills and fever.  HENT: Negative for sore throat and trouble swallowing.   Eyes: Negative for photophobia and visual disturbance.  Respiratory: Positive for shortness of breath (sometimes with exertion). Negative for cough.   Cardiovascular: Negative for chest pain, palpitations and leg swelling.  Gastrointestinal: Negative for abdominal pain, blood in stool, constipation, diarrhea and nausea.  Endocrine: Negative for cold intolerance, heat intolerance, polydipsia, polyphagia and polyuria.  Genitourinary: Positive for vaginal discharge. Negative for dysuria, frequency, pelvic pain, vaginal bleeding and vaginal pain.  Musculoskeletal: Negative for arthralgias, back pain and gait problem.  Neurological: Negative for dizziness and headaches.  Hematological: Negative for adenopathy. Does not bruise/bleed easily.    Psychiatric/Behavioral: Negative for self-injury and suicidal ideas. The patient is nervous/anxious (sometimes).       Objective:    Physical Exam  Constitutional: She is oriented to person, place, and time. She appears well-developed and well-nourished.  WNWD older female in NAD wearing face mask as per office [id number] policy  Cardiovascular: Normal rate and regular rhythm.  Pulmonary/Chest: Effort normal and breath sounds normal.  Abdominal: Soft. There is no abdominal tenderness. There is no rebound and no guarding.  Genitourinary:    Vaginal discharge present.     Genitourinary Comments: Normal appearance to the external genitalia; presence of thin whitish discharge within the vaginal canal/vault. Normal appearance to the cervix. No  adnexal or cervical motion tenderness on exam; mild bleeding on surface of the cervix with collection of sample for pap   Musculoskeletal:        General: No tenderness or edema.  Neurological: She is alert and oriented to person, place, and time.  Skin: Skin is warm and dry.  Psychiatric: She has a normal mood and affect. Her behavior is normal.  Nursing note and vitals reviewed.   BP 130/84 (BP Location: Left Arm, Patient Position: Sitting, Cuff Size: Large)   Pulse 89   Temp 98.3 F (36.8 C) (Oral)   Ht '5\' 4"'  (1.626 m)   Wt 193 lb 3.2 oz (87.6 kg)   SpO2 99%   BMI 33.16 kg/m  Wt Readings from Last 3 Encounters:  03/15/19 193 lb 3.2 oz (87.6 kg)  03/06/18 183 lb 3.2 oz (83.1 kg)  07/05/16 197 lb (89.4 kg)     Health Maintenance Due  Topic Date Due  . Hepatitis C Screening  08-21-1955  . TETANUS/TDAP  02/11/1974  . COLONOSCOPY  02/11/2005  . PAP SMEAR-Modifier  06/16/2017  . MAMMOGRAM  10/19/2018  . INFLUENZA VACCINE  12/08/2018      Lab Results  Component Value Date   TSH 2.450 03/06/2018   Lab Results  Component Value Date   WBC 5.1 03/06/2018   HGB 13.9 03/06/2018   HCT 41.8 03/06/2018   MCV 94 03/06/2018   PLT 337  03/06/2018   Lab Results  Component Value Date   NA 146 (H) 03/06/2018   K 4.1 03/06/2018   CO2 23 03/06/2018   GLUCOSE 88 03/06/2018   BUN 8 03/06/2018   CREATININE 0.90 03/06/2018   BILITOT 0.5 03/06/2018   ALKPHOS 95 03/06/2018   AST 17 03/06/2018   ALT 10 03/06/2018   PROT 7.5 03/06/2018   ALBUMIN 4.5 03/06/2018   CALCIUM 10.0 03/06/2018   ANIONGAP 7 05/16/2014   Lab Results  Component Value Date   CHOL 218 (H) 03/06/2018   Lab Results  Component Value Date   HDL 55 03/06/2018   Lab Results  Component Value Date   LDLCALC 137 (H) 03/06/2018   Lab Results  Component Value Date   TRIG 130 03/06/2018   Lab Results  Component Value Date   CHOLHDL 4.0 03/06/2018   Lab Results  Component Value Date   HGBA1C 5.5 11/08/2012      Assessment & Plan:  1. Vaginal discharge Sample of vaginal discharge obtained and will be sent for cervicovaginal ancillary testing and patient will be notified of the results and if further treatment is needed based on the results. Patient thinks that her itching might be yeast related and requests RX for diflucan which was sent into her pharmacy. - Cervicovaginal ancillary only - fluconazole (DIFLUCAN) 150 MG tablet; Take 1 tablet (150 mg total) by mouth once for 1 dose.  Dispense: 1 tablet; Refill: 0  2. Screening for cervical cancer She has not had a recent pap and agreed to have this done at today's visit and she will be notified if further treatment is needed based on the results.  - Cytology - PAP  3. Gastroesophageal reflux disease without esophagitis Refill provided for omeprazole 20 mg twice per day. Discussed avoidance of known trigger foods, foods that are spicy/greasy and avoidance of late night eating.   4. Postmenopausal estrogen deficiency Discussed DEXA scan and the need for Vitamin D as well as possible calcium supplement. Continue weight bearing exercise such as walking. -  DG BONE DENSITY (DXA); Future  5. Encounter  for screening mammogram for malignant neoplasm of breast Patient did not have the previously scheduled mammogram and this will be rescheduled along with DEXA scan. - MM Digital Screening; Future  An After Visit Summary was printed and given to the patient.  Follow-up: Return in about 5 months (around 08/13/2019) for chronic issues and as needed.    Antony Blackbird, MD

## 2019-03-19 ENCOUNTER — Other Ambulatory Visit (HOSPITAL_BASED_OUTPATIENT_CLINIC_OR_DEPARTMENT_OTHER): Payer: Medicaid Other | Admitting: Family Medicine

## 2019-03-19 DIAGNOSIS — N760 Acute vaginitis: Secondary | ICD-10-CM | POA: Diagnosis not present

## 2019-03-19 DIAGNOSIS — B9689 Other specified bacterial agents as the cause of diseases classified elsewhere: Secondary | ICD-10-CM | POA: Diagnosis not present

## 2019-03-19 LAB — CERVICOVAGINAL ANCILLARY ONLY
Bacterial Vaginitis (gardnerella): POSITIVE — AB
Candida Glabrata: NEGATIVE
Candida Vaginitis: NEGATIVE
Chlamydia: NEGATIVE
Comment: NEGATIVE
Comment: NEGATIVE
Comment: NEGATIVE
Comment: NEGATIVE
Comment: NEGATIVE
Comment: NORMAL
Neisseria Gonorrhea: NEGATIVE
Trichomonas: NEGATIVE

## 2019-03-19 MED ORDER — METRONIDAZOLE 500 MG PO TABS: 500.0000 mg | ORAL_TABLET | Freq: Two times a day (BID) | ORAL | 0 refills | Status: DC

## 2019-03-19 NOTE — Progress Notes (Signed)
Patient ID: Janice Adams, female   DOB: 22-Feb-1956, 63 y.o.   MRN: [id number]   Patient with bacterial vaginitis on cervicovaginal ancillary testing and RX will be sent into patient's pharmacy for metronidazole for treatment

## 2019-03-22 ENCOUNTER — Telehealth: Payer: Self-pay | Admitting: Family Medicine

## 2019-03-22 ENCOUNTER — Other Ambulatory Visit: Payer: Self-pay | Admitting: Family Medicine

## 2019-03-22 DIAGNOSIS — R0609 Other forms of dyspnea: Secondary | ICD-10-CM

## 2019-03-22 DIAGNOSIS — Z720 Tobacco use: Secondary | ICD-10-CM

## 2019-03-22 NOTE — Telephone Encounter (Signed)
I placed an order for a DEXA scan and mammogram. I will place an order for a CXR but she did not want this ordered at her last visit

## 2019-03-22 NOTE — Telephone Encounter (Signed)
Patient called stating that she had went to get the x ray you wanted but it was never ordered

## 2019-03-22 NOTE — Progress Notes (Signed)
Patient ID: Janice Adams, female   DOB: 01/08/1956, 63 y.o.   MRN: [id number]   Patient lett message requesting an xray that was discussed at her last visit. I believe that a CXR may have been mentioned but patient did not want this ordered at her last visit. Will place order and medical assistance will contact patient regarding order.

## 2019-03-25 LAB — CYTOLOGY - PAP
Adequacy: ABSENT
Comment: NEGATIVE
Diagnosis: NEGATIVE
High risk HPV: NEGATIVE

## 2019-03-28 NOTE — Telephone Encounter (Signed)
Patient name and DOB verified. Aware that order was in for CXR

## 2019-03-28 NOTE — Telephone Encounter (Signed)
Patient called for the xray to be ordered

## 2019-04-01 ENCOUNTER — Other Ambulatory Visit: Payer: Self-pay

## 2019-04-01 ENCOUNTER — Ambulatory Visit (HOSPITAL_COMMUNITY)
Admission: RE | Admit: 2019-04-01 | Discharge: 2019-04-01 | Disposition: A | Discharge status: Home / Self Care | Payer: Medicaid Other | Source: Ambulatory Visit | Attending: Family Medicine | Admitting: Family Medicine

## 2019-04-01 DIAGNOSIS — R0609 Other forms of dyspnea: Secondary | ICD-10-CM

## 2019-04-01 DIAGNOSIS — R0600 Dyspnea, unspecified: Secondary | ICD-10-CM | POA: Insufficient documentation

## 2019-04-01 DIAGNOSIS — Z720 Tobacco use: Secondary | ICD-10-CM | POA: Diagnosis not present

## 2019-04-01 IMAGING — CR DG CHEST 2V
2 series · 2 of 2 positions shown · non-contrast
Comparison: [DATE]

CLINICAL DATA: Shortness of breath and cough, history of tobacco
use

EXAM:
CHEST - 2 VIEW

[chest pa]
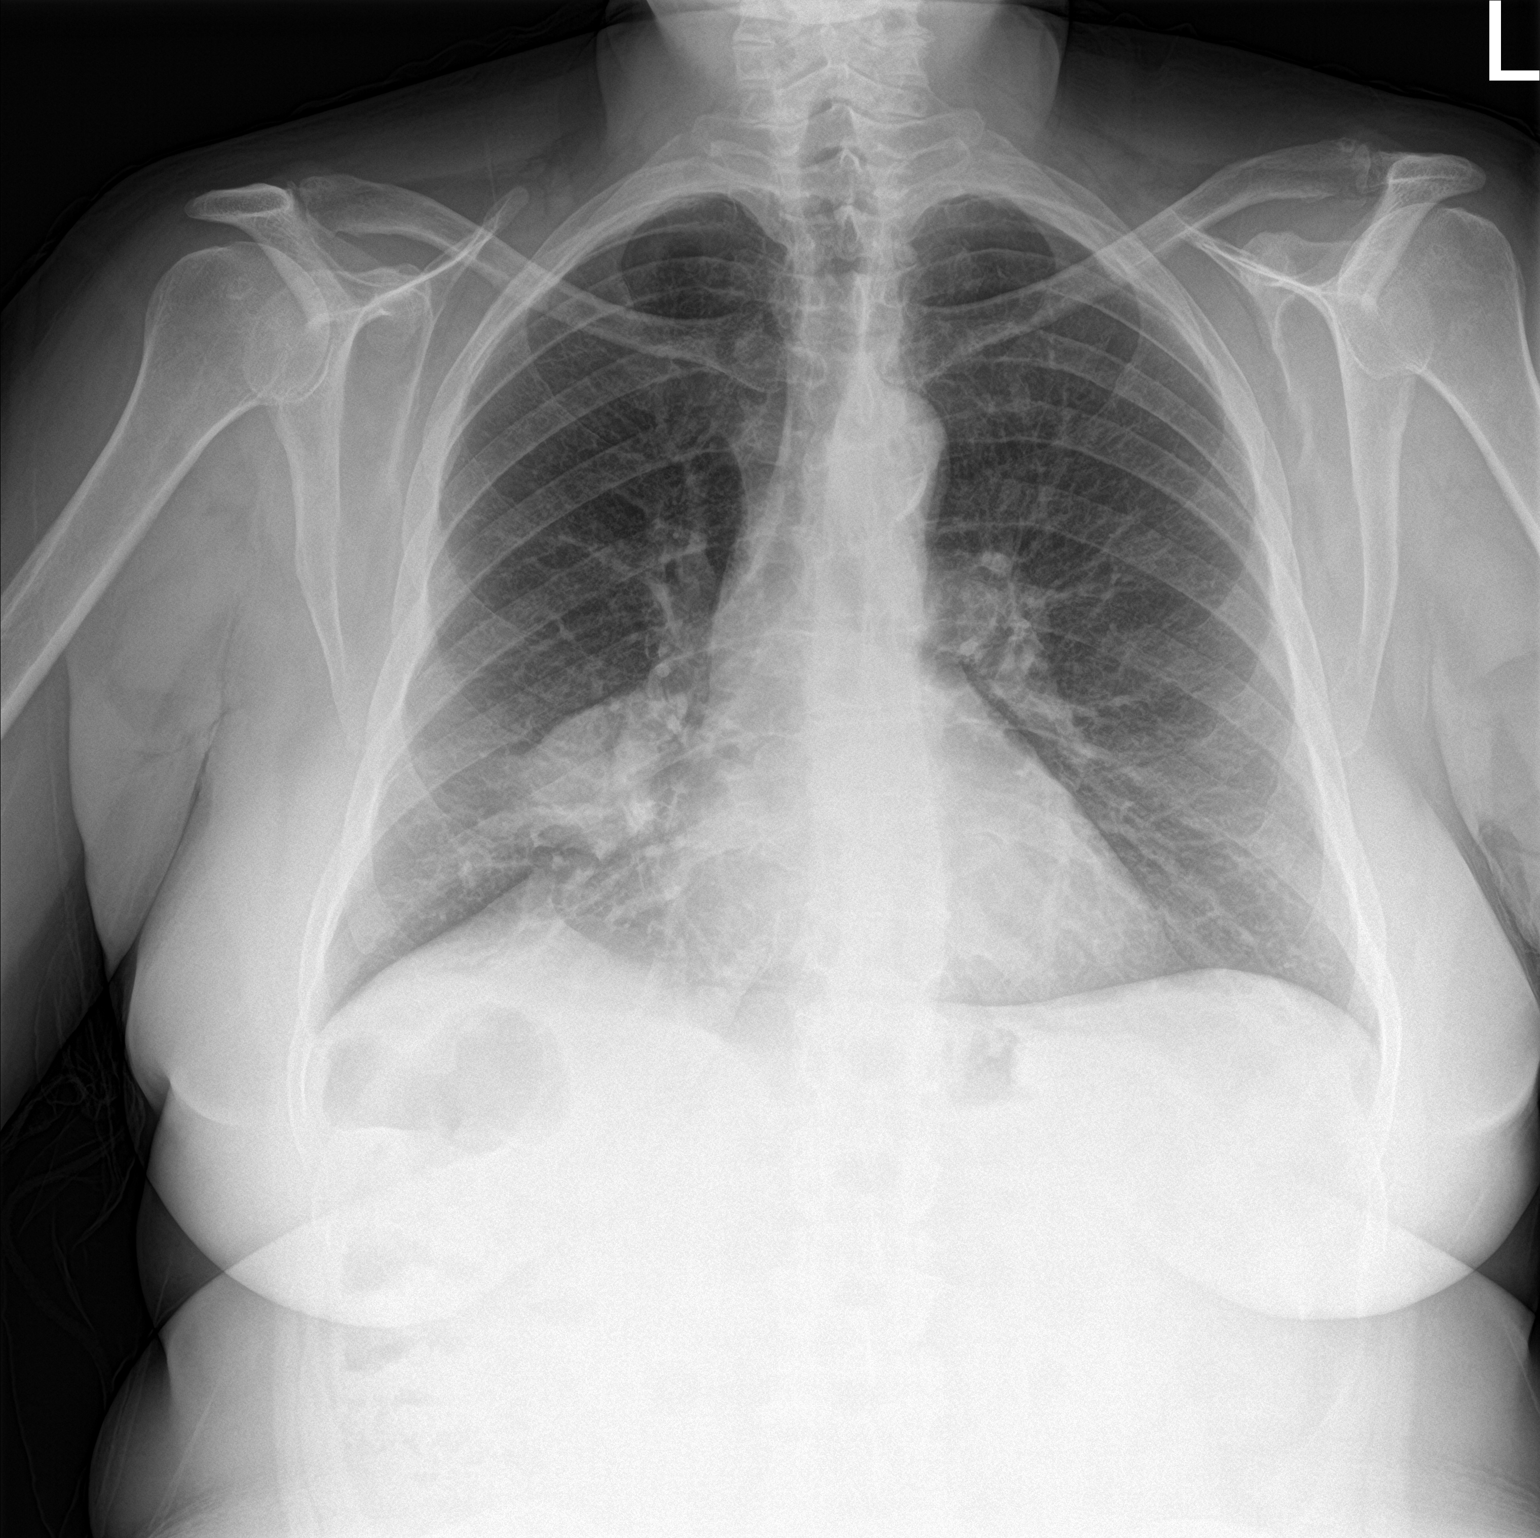

[chest lat]
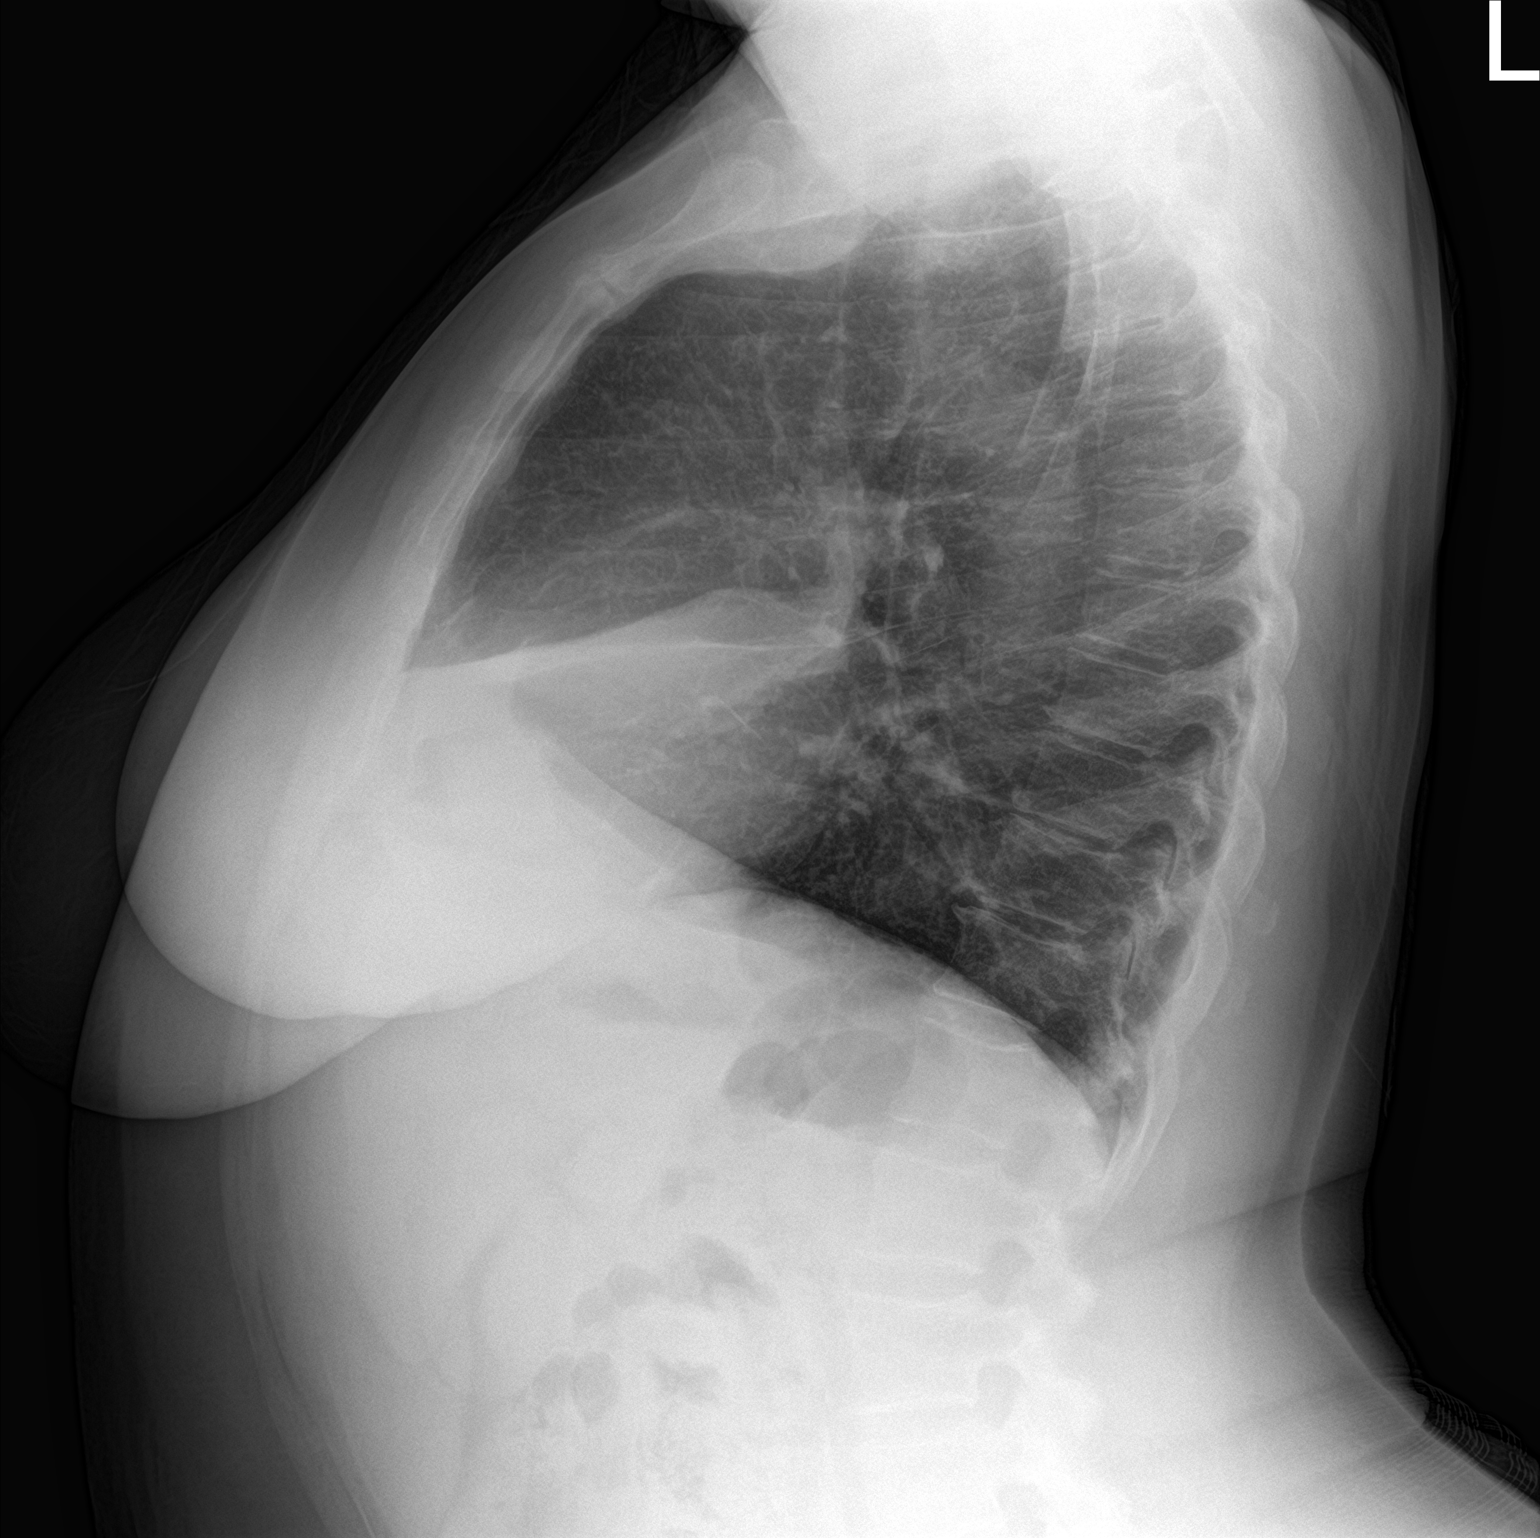

[2 of 2 positions shown; findings below may reference images not displayed]

FINDINGS: Cardiac shadows within normal limits. The lungs are well aerated
bilaterally. Somewhat rounded increased density is noted in the
distribution of the right middle lobe. This likely represents acute
pneumonia although a mass cannot be totally excluded. Contrast
enhanced CT is recommended. No bony abnormality is noted.
IMPRESSION: Fullness in the right infrahilar region and right middle lobe. This
may simply represent focal infiltrate although central mass cannot
be totally excluded. CT of the chest with contrast material is
recommended for further evaluation.

These results will be called to the ordering clinician or
representative by the Radiologist Assistant, and communication
documented in the PACS or zVision Dashboard.

## 2019-04-02 ENCOUNTER — Other Ambulatory Visit: Payer: Self-pay | Admitting: Nurse Practitioner

## 2019-04-02 DIAGNOSIS — R9389 Abnormal findings on diagnostic imaging of other specified body structures: Secondary | ICD-10-CM

## 2019-04-02 NOTE — Progress Notes (Unsigned)
Rcvd call from Radiology regarding abnormal chest xray. Patient was informed by myself via phone call and CT chest w/contrast has been ordered.

## 2019-04-16 ENCOUNTER — Telehealth: Payer: Self-pay | Admitting: *Deleted

## 2019-04-16 NOTE — Progress Notes (Signed)
Completed.

## 2019-04-16 NOTE — Telephone Encounter (Signed)
Prior for CT of the chest was completed  CPT code 94174, Auth number [id number] good from 04-16-19 until June 6th, 2021 with Case ID [id number].

## 2019-04-17 ENCOUNTER — Telehealth: Payer: Self-pay | Admitting: *Deleted

## 2019-04-17 NOTE — Telephone Encounter (Signed)
Spoke with patient and informed her with her CT appt for Dec 18th and how she have to arrive at 2:15 to do STAT Labs due to not having any recent labs and then her appt will start at 3pm. Patient agreed with date, time and agreed to not drink anything 4 hours prior to her appt time.

## 2019-04-26 ENCOUNTER — Other Ambulatory Visit: Payer: Self-pay | Admitting: Family Medicine

## 2019-04-26 ENCOUNTER — Other Ambulatory Visit: Payer: Self-pay

## 2019-04-26 ENCOUNTER — Ambulatory Visit (HOSPITAL_COMMUNITY)
Admission: RE | Admit: 2019-04-26 | Discharge: 2019-04-26 | Disposition: A | Discharge status: Home / Self Care | Payer: Medicaid Other | Source: Ambulatory Visit | Attending: Nurse Practitioner | Admitting: Nurse Practitioner

## 2019-04-26 DIAGNOSIS — R9389 Abnormal findings on diagnostic imaging of other specified body structures: Secondary | ICD-10-CM

## 2019-04-26 DIAGNOSIS — Z79899 Other long term (current) drug therapy: Secondary | ICD-10-CM

## 2019-04-26 LAB — POCT I-STAT CREATININE: Creatinine, Ser: 0.9 mg/dL (ref 0.44–1.00)

## 2019-04-26 IMAGING — CT CT CHEST W/ CM
2 of 3 series · 15 of 46 positions shown, 17 images · IV contrast (Omni 300)
Comparison: Chest x-ray [DATE].

CLINICAL DATA: Abnormality on a chest x-ray from [DATE].
Evaluate for mass versus infiltrate.

EXAM:
CT CHEST WITH CONTRAST
TECHNIQUE: Multidetector CT imaging of the chest was performed during
intravenous contrast administration.
CONTRAST:  75mL OMNIPAQUE IOHEXOL 300 MG/ML  SOLN

[Series 3: chest with 2mm st · axial · 0.76mm/px · z∈[+1139,+1381]mm · 12 of 141 slices shown, 14 images]
[im 10/141  soft-tissue]
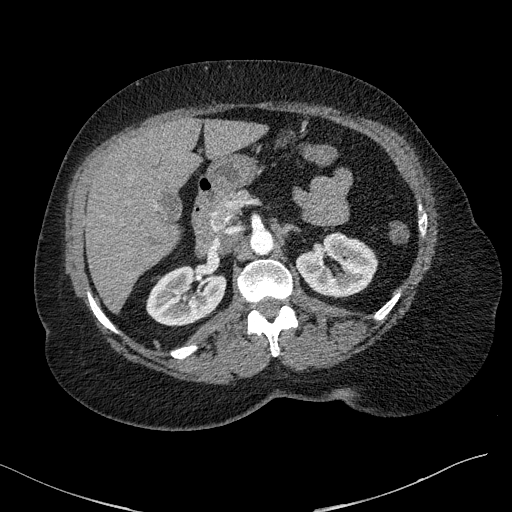
[im 10/141  bone]
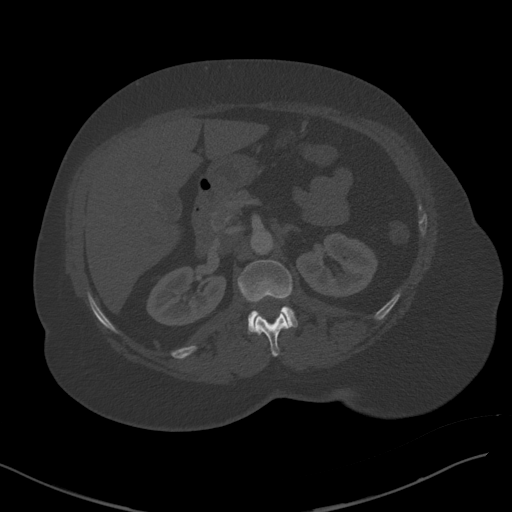
[im 19/141  soft-tissue]
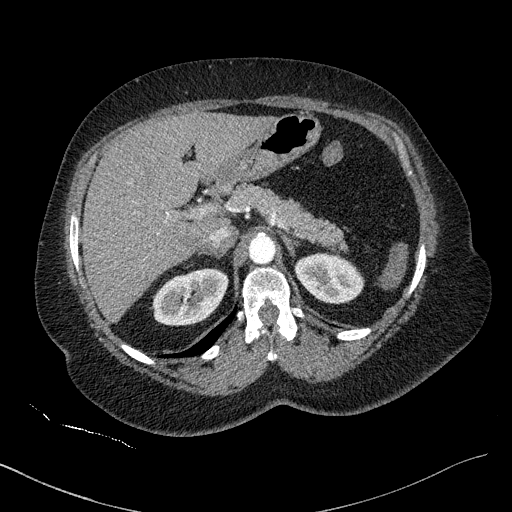
[im 32/141  soft-tissue]
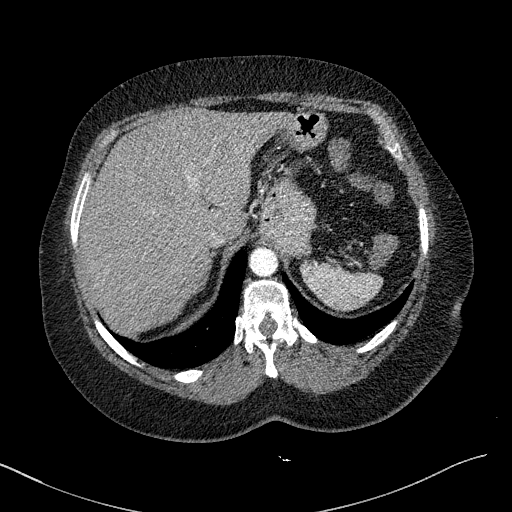
[im 41/141  soft-tissue]
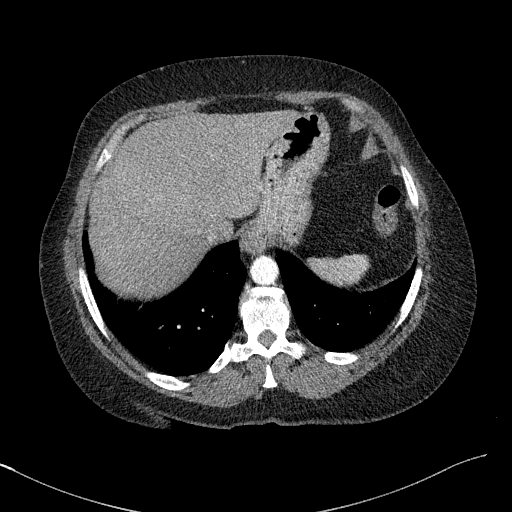
[im 55/141  soft-tissue]
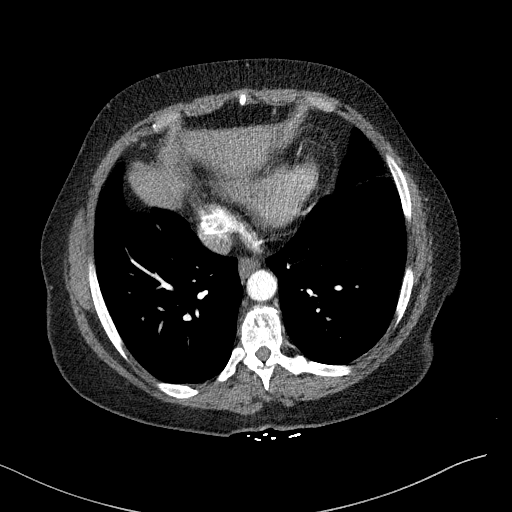
[im 64/141  soft-tissue]
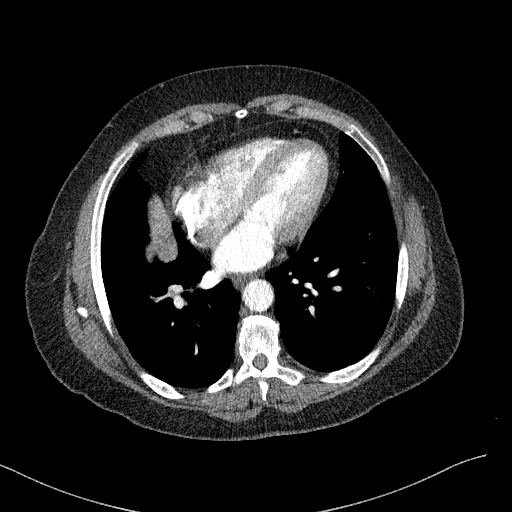
[im 77/141  soft-tissue]
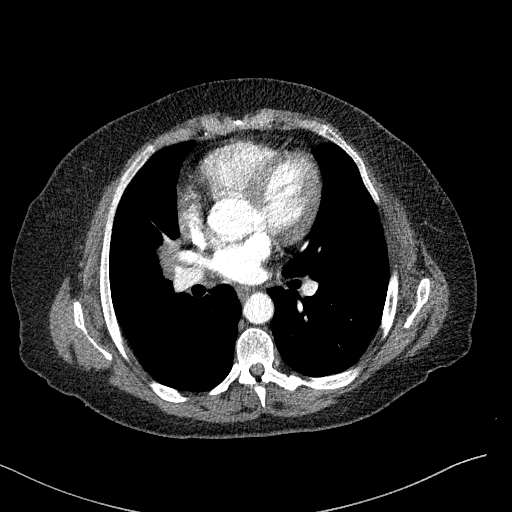
[im 86/141  soft-tissue]
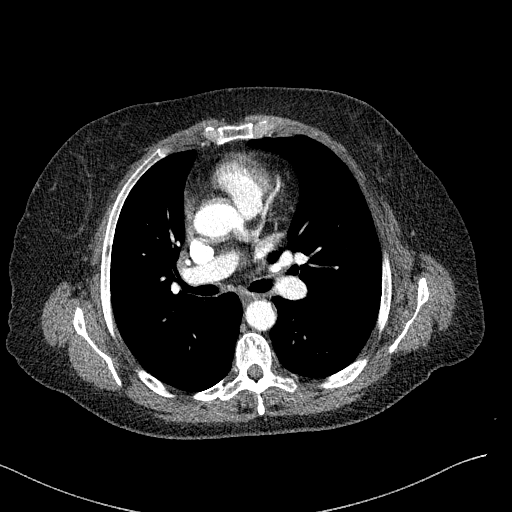
[im 100/141  soft-tissue]
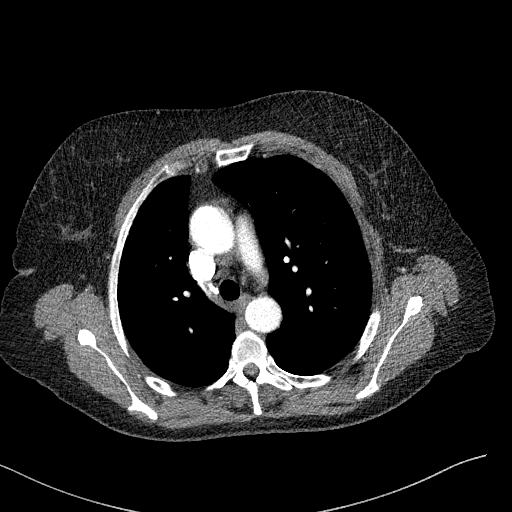
[im 100/141  bone]
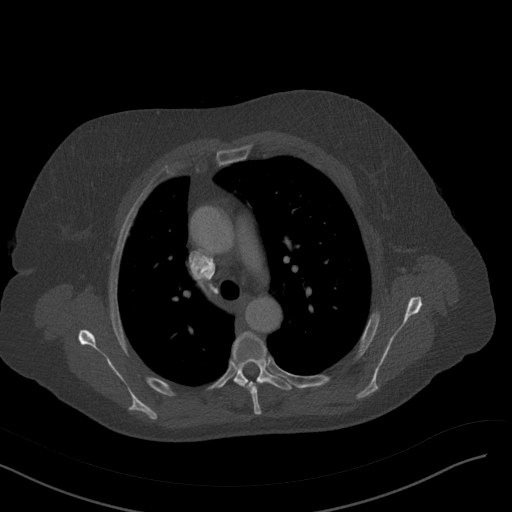
[im 109/141  soft-tissue]
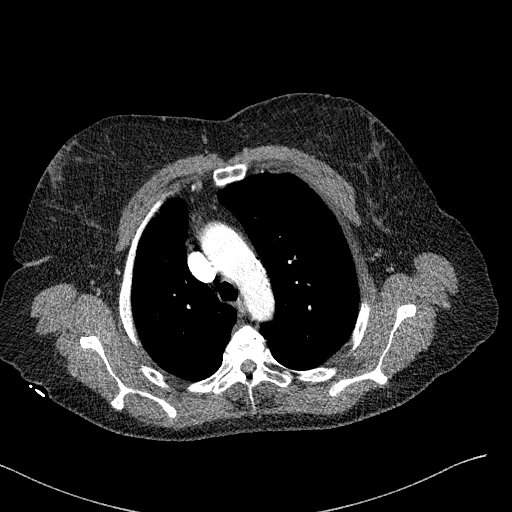
[im 122/141  soft-tissue]
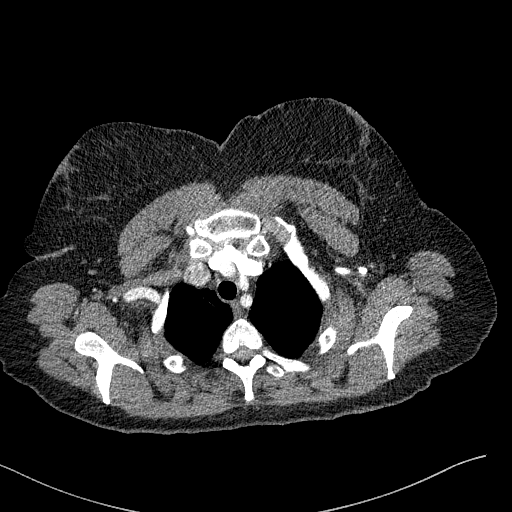
[im 131/141  soft-tissue]
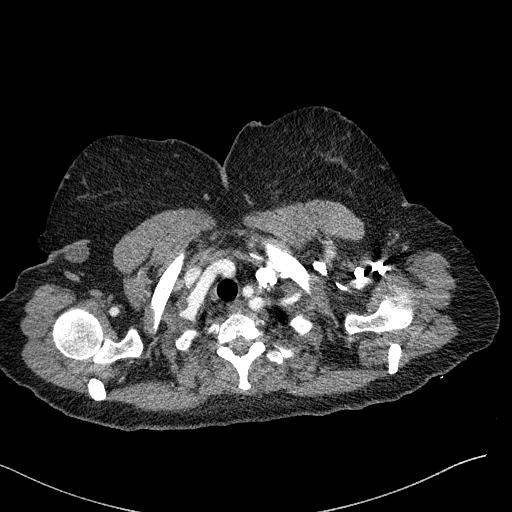

[Series 6: chest with 3mm st cor · coronal · 0.59mm/px · 3 of 101 slices shown]
[im 34/101  soft-tissue]
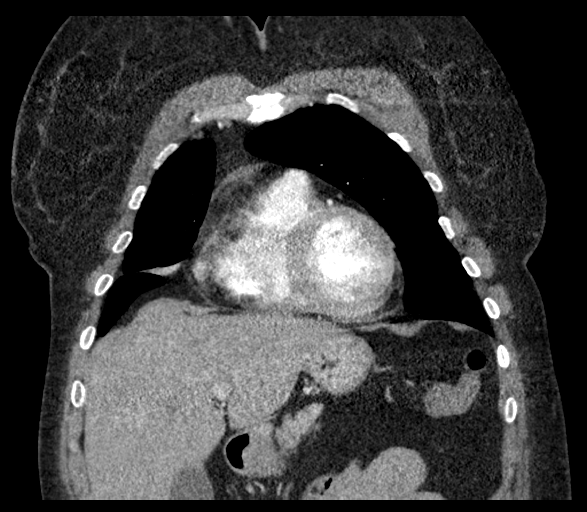
[im 45/101  soft-tissue]
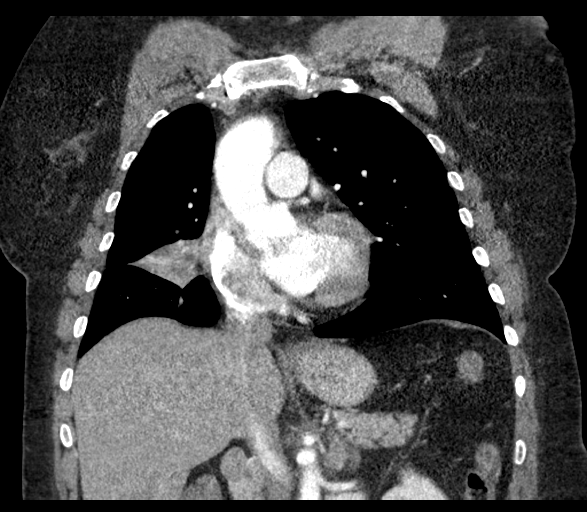
[im 56/101  soft-tissue]
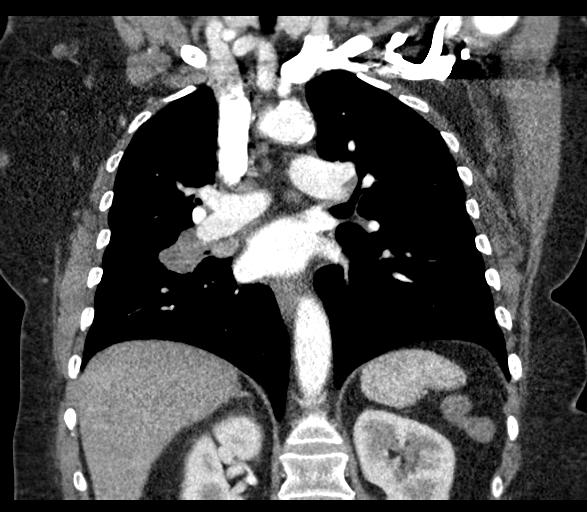

[15 of 46 positions shown; findings below may reference images not displayed]

FINDINGS: Cardiovascular: The heart size is normal. No obvious coronary artery
calcifications are identified. The central pulmonary arteries are
normal in caliber. The thoracic aorta demonstrates atherosclerotic
change without aneurysm or dissection.

Mediastinum/Nodes: Subcutaneous nodularity is seen along the lateral
right breast as seen on images 5 and 12 of series 3, measuring up to
19 mm. I suspect these are sebaceous or inclusion cysts. The chest
wall is otherwise unremarkable. No adenopathy in the base of neck or
axillary regions. No mediastinal adenopathy is noted. The left hilum
is without adenopathy as well. There is a mass in the right
infrahilar region measuring 5.3 x 3.6 by 3.2 cm. It is unclear
whether this mass is centered in the lung or hilum. There is soft
tissue nodularity within the right infrahilar region which could
represent extension of the mass or a right hilar node on series 3,
image 70 measuring 1.1 x 1.5 cm. No effusions. The thyroid is
normal. The esophagus is normal.

Lungs/Pleura: The trachea and mainstem bronchi are normal. The mass
either within the inferior right hilum or the adjacent lung encases
the right middle lobe bronchus with abrupt cut off and collapse of
the right middle lobe. Other airways are normal. No pneumothorax.
Emphysematous changes are seen, particularly in the apices. No
nodules or masses are seen in the left lung. There is platelike
opacity in the anterior right lower lobe consistent with
atelectasis. There is a nodule medially in the right chest on series
4, image 60, within the superior segment of the right lower lobe.
This nodule measures 12 mm. A few scattered small nodules are seen
in the right upper lobe such as on series 4, images 27, 43, 48, and
51. A few tiny nodules are seen in the left upper lobe such as on
series 4, images 23, 26, and 30.

Upper Abdomen: Images of the liver very noisy. A small lesion in the
dome is not excluded on series 3, image 105 measuring 10 mm. The
upper abdomen is otherwise normal.

Musculoskeletal: No definite bony metastatic disease is identified.
IMPRESSION: 1. There is a mass in the right infrahilar region which could arise
from lung or the hilum itself. The finding is consistent with a
primary lung malignancy. The mass occludes the right middle lobe
bronchus centrally with right middle lobe collapse. The mass encases
pulmonary arterial branches supplying the right middle lobe.
2. There is a nodule in the medial aspect of the right lower lobe
superior segment which could represent a second primary malignancy
or a metastatic lesion. PET-CT may better evaluate.
3. Soft tissue in the right hilum on series 3, image 70 could
represent extension of the primary mass versus a right hilar node.
4. Multiple small nodules in the bilateral upper lobes, right
greater than left, are nonspecific.
5. Limited visualization of the liver dome. A subtle lesion is not
excluded measuring 10 mm on series 3, image 11. Dedicated imaging of
the liver could better evaluate. An MRI would be most sensitive.
6. Atherosclerotic change in the thoracic aorta.
7. Subcutaneous nodularity along the lateral right breast is favored
to represent sebaceous or inclusion cysts.
8. Emphysematous changes, particularly in the apices.

These results will be called to the ordering clinician or
representative by the Radiologist Assistant, and communication
documented in the PACS or zVision Dashboard.

Aortic Atherosclerosis ([LO]-[LO]) and Emphysema ([LO]-[LO]).

## 2019-04-26 MED ORDER — IOHEXOL 300 MG/ML  SOLN
75.0000 mL | Freq: Once | INTRAMUSCULAR | Status: AC | PRN
  Administered 2019-04-26: 17:00:00 75 mL via INTRAVENOUS

## 2019-04-26 NOTE — Progress Notes (Signed)
Patient ID: Janice Adams, female   DOB: Oct 13, 1955, 63 y.o.   MRN: [id number]   Call received from radiology that order needed for i-STAT creatinine as patient is therefore procedure/CT scan in follow-up of abnormal chest x-ray.  Order placed

## 2019-04-29 ENCOUNTER — Telehealth: Payer: Self-pay | Admitting: Family Medicine

## 2019-04-29 DIAGNOSIS — R918 Other nonspecific abnormal finding of lung field: Secondary | ICD-10-CM

## 2019-04-29 NOTE — Telephone Encounter (Signed)
Patient with recent abnormal chest CT on 04/26/2019 with a mass in the right infrahilar region which might be arising from the lung or hilum and appears to be consistent with a primary lung malignancy.  The mass occludes the right middle lobe bronchus centrally with right middle lobe collapse.  The mass encases pulmonary arterial branches supplying the right middle lobe.  There is also a nodule on the medial aspect of the right lower lobe which could represent a second primary malignancy or metastatic lesion.  CT was read on 04/27/2019 at 14:39 with no that the results will be called to the ordering clinician or representative however there is no documentation of anyone being contacted.  I attempted to reach the patient by phone however she was not at home and I was told that she will not return until tomorrow.  I will go ahead and place a stat pulmonology referral as patient will likely need oncology/CT surgery referral as well but may need to have biopsy of mass by pulmonology to determine diagnosis.  I am out of the office on Tuesdays and will forward a message to covering provider to attempt to contact patient on Tuesday regarding the results and referral.

## 2019-04-30 ENCOUNTER — Encounter: Payer: Self-pay | Admitting: Nurse Practitioner

## 2019-04-30 NOTE — Progress Notes (Signed)
Contacted patient this morning approximately 8:45 am regarding her CT results and Lung nodule/CA. She is aware that Pulmonary medicine will be contacting her, likely need for lung biopsy for tumor staging and that urgent referral has been made by her PCP. All questions answered at this time.

## 2019-05-09 ENCOUNTER — Other Ambulatory Visit: Payer: Self-pay | Admitting: Family Medicine

## 2019-05-09 ENCOUNTER — Telehealth: Payer: Self-pay | Admitting: Family Medicine

## 2019-05-09 DIAGNOSIS — F17200 Nicotine dependence, unspecified, uncomplicated: Secondary | ICD-10-CM

## 2019-05-09 DIAGNOSIS — R918 Other nonspecific abnormal finding of lung field: Secondary | ICD-10-CM

## 2019-05-09 NOTE — Progress Notes (Signed)
Patient ID: Janice Adams, female   DOB: 26-Aug-1955, 63 y.o.   MRN: [id number]   63 year old female with complaint of shortness of breath and had abnormal chest x-ray followed by CT scan done 04/26/2019 with findings of a mass in the right infrahilar region which may be arising from the lung or the hilum itself.  Per radiology impression finding is consistent with a primary lung malignancy the mass occludes the right middle lobe bronchus centrally with right middle lobe collapse the mass encases pulmonary arterial branches supplying the right middle lobe.  CT scan was discussed with another provider in the clinic who specializes in pulmonology who suggested that patient be urgently referred to pulmonology so that tissue sampling can be done to determine diagnosis.  Patient does not appear to have a pulmonology appointment scheduled at this time therefore I am now placing a stat/urgent oncology referral.  Patient also left phone message regarding the need for help with smoking cessation and referral placed to clinical pharmacist to help patient with smoking cessation.

## 2019-05-09 NOTE — Telephone Encounter (Signed)
Please notify patient that the clinical pharmacist will contact her for counseling regarding smoking cessation and medications

## 2019-05-09 NOTE — Telephone Encounter (Signed)
Patient called requesting something to help her stop smoking

## 2019-05-21 ENCOUNTER — Other Ambulatory Visit: Payer: Self-pay | Admitting: Medical Oncology

## 2019-05-21 ENCOUNTER — Inpatient Hospital Stay: Payer: Medicaid Other | Attending: Internal Medicine | Admitting: Internal Medicine

## 2019-05-21 ENCOUNTER — Encounter: Payer: Self-pay | Admitting: Internal Medicine

## 2019-05-21 ENCOUNTER — Other Ambulatory Visit: Payer: Self-pay

## 2019-05-21 ENCOUNTER — Inpatient Hospital Stay: Payer: Medicaid Other

## 2019-05-21 VITALS — BP 165/90 | HR 100 | Temp 98.6°F | Resp 20 | Ht 64.0 in | Wt 193.2 lb

## 2019-05-21 DIAGNOSIS — Z803 Family history of malignant neoplasm of breast: Secondary | ICD-10-CM | POA: Diagnosis not present

## 2019-05-21 DIAGNOSIS — R911 Solitary pulmonary nodule: Secondary | ICD-10-CM

## 2019-05-21 DIAGNOSIS — J9819 Other pulmonary collapse: Secondary | ICD-10-CM | POA: Insufficient documentation

## 2019-05-21 DIAGNOSIS — F17210 Nicotine dependence, cigarettes, uncomplicated: Secondary | ICD-10-CM | POA: Diagnosis not present

## 2019-05-21 DIAGNOSIS — I10 Essential (primary) hypertension: Secondary | ICD-10-CM

## 2019-05-21 DIAGNOSIS — R918 Other nonspecific abnormal finding of lung field: Secondary | ICD-10-CM

## 2019-05-21 DIAGNOSIS — F17200 Nicotine dependence, unspecified, uncomplicated: Secondary | ICD-10-CM

## 2019-05-21 DIAGNOSIS — Z79899 Other long term (current) drug therapy: Secondary | ICD-10-CM | POA: Insufficient documentation

## 2019-05-21 LAB — CBC WITH DIFFERENTIAL (CANCER CENTER ONLY)
Abs Immature Granulocytes: 0.01 10*3/uL (ref 0.00–0.07)
Basophils Absolute: 0 10*3/uL (ref 0.0–0.1)
Basophils Relative: 1 %
Eosinophils Absolute: 0.1 10*3/uL (ref 0.0–0.5)
Eosinophils Relative: 1 %
HCT: 42.9 % (ref 36.0–46.0)
Hemoglobin: 14.4 g/dL (ref 12.0–15.0)
Immature Granulocytes: 0 %
Lymphocytes Relative: 40 %
Lymphs Abs: 2 10*3/uL (ref 0.7–4.0)
MCH: 33 pg (ref 26.0–34.0)
MCHC: 33.6 g/dL (ref 30.0–36.0)
MCV: 98.4 fL (ref 80.0–100.0)
Monocytes Absolute: 0.4 10*3/uL (ref 0.1–1.0)
Monocytes Relative: 8 %
Neutro Abs: 2.4 10*3/uL (ref 1.7–7.7)
Neutrophils Relative %: 50 %
Platelet Count: 344 10*3/uL (ref 150–400)
RBC: 4.36 MIL/uL (ref 3.87–5.11)
RDW: 13.3 % (ref 11.5–15.5)
WBC Count: 4.9 10*3/uL (ref 4.0–10.5)
nRBC: 0 % (ref 0.0–0.2)

## 2019-05-21 LAB — CMP (CANCER CENTER ONLY)
ALT: 11 U/L (ref 0–44)
AST: 16 U/L (ref 15–41)
Albumin: 4.2 g/dL (ref 3.5–5.0)
Alkaline Phosphatase: 116 U/L (ref 38–126)
Anion gap: 13 (ref 5–15)
BUN: 9 mg/dL (ref 8–23)
CO2: 25 mmol/L (ref 22–32)
Calcium: 9.9 mg/dL (ref 8.9–10.3)
Chloride: 104 mmol/L (ref 98–111)
Creatinine: 1 mg/dL (ref 0.44–1.00)
GFR, Est AFR Am: >60 mL/min (ref >60)
GFR, Estimated: 59 mL/min — ABNORMAL LOW (ref >60)
Glucose, Bld: 91 mg/dL (ref 70–99)
Potassium: 4.3 mmol/L (ref 3.5–5.1)
Sodium: 142 mmol/L (ref 135–145)
Total Bilirubin: 0.4 mg/dL (ref 0.3–1.2)
Total Protein: 8.4 g/dL — ABNORMAL HIGH (ref 6.5–8.1)

## 2019-05-21 NOTE — Progress Notes (Signed)
[place] [phone]   [phone]  CONSULT NOTE  REFERRING PHYSICIAN: Dr. Antony Blackbird  REASON FOR CONSULTATION:  64 years old African-American female with suspicious lung cancer.  HPI Janice Adams is a 64 y.o. female with past medical history significant for epigastric pain and GERD as well as long history for smoking.  The patient was seen by her primary care physician and November 2020 complaining of shortness of breath as well as cough and tightness in her chest.  She had chest x-ray performed on April 01, 2019 and that showed fullness in the right infrahilar region and right middle lobe.  This was followed by CT scan of the chest on April 27, 2019 and that showed subcutaneous nodularity seen along the lateral right breast measuring up to 1.9 cm suspicious for sebaceous or inclusion cyst.  The scan also showed a mass in the right infrahilar region measuring 5.3 x 3.6 x 3.2 cm.  There is soft tissue nodularity within the right infrahilar region that could represent extension of the mass or her right hilar node measuring 1.1 x 1.5 cm.  The mass either within the inferior right hilum or the adjacent lung encases the right middle lobe bronchus with abrupt cut off and collapse of the right middle lobe.  There is a nodule medially in the right chest within the superior segment of the right lower lobe measured 1.2 cm and diffuse scattered small nodules seen in the right upper lobe as well as few tiny nodules in the left upper lobe.  The scan also showed a supple lesion in the liver measuring 1.0 cm not well characterized. The patient was referred to me today for further evaluation and recommendation regarding her condition. When seen today the patient is feeling fine except for occasional cough and chest tightness with no significant shortness of breath or hemoptysis.  She denied having any weight loss or night sweats.  She has some sleep disturbance.  She  denied having any headache or visual changes.  She has no nausea, vomiting, diarrhea or constipation.  She has no fever or chills. Family history significant for mother daughter and maternal grandmother with breast cancer.  Father had seizure. Social history.  The patient is single and has 5 biologic children.  She used to work as a Public librarian.  She has a history of smoking less than 1 pack/day for around 48 years and unfortunately she continues to smoke.  She has no history of alcohol or drug abuse.  Past Medical History:  Diagnosis Date   GERD (gastroesophageal reflux disease)    History of epigastric pain    comes and goes    Past Surgical History:  Procedure Laterality Date   NO PAST SURGERIES      Family History  Problem Relation Age of Onset   Breast cancer Mother    Cancer Mother    Seizures Father    Breast cancer Maternal Grandmother    Breast cancer Daughter    Cancer Daughter     Social History Social History   Tobacco Use   Smoking status: Current Some Day Smoker    Packs/day: 0.50    Types: Cigarettes   Smokeless tobacco: Never Used   Tobacco comment: Smoking 5 cigs/day  Substance Use Topics   Alcohol use: No    Alcohol/week: 0.0 standard drinks   Drug use: No    Types: Marijuana    No Known Allergies  Current Outpatient Medications  Medication Sig Dispense Refill   indomethacin (INDOCIN) 50 MG capsule Take 1 capsule (50 mg total) by mouth 2 (two) times daily with a meal. As needed for gout pain 30 capsule 0   omeprazole (PRILOSEC) 20 MG capsule Take 1 capsule (20 mg total) by mouth 2 (two) times daily before a meal. To lower stomach acid 60 capsule 5   amLODipine (NORVASC) 5 MG tablet Take 1 tablet (5 mg total) by mouth daily. To lower blood pressure (Patient not taking: Reported on 05/21/2019) 30 tablet 3   HYDROcodone-acetaminophen (NORCO) 5-325 MG per tablet Take 1 tablet by mouth every 6 (six) hours as needed. (Patient not taking:  Reported on 05/11/2016) 15 tablet 0   nicotine (NICODERM CQ - DOSED IN MG/24 HOURS) 21 mg/24hr patch Place 1 patch (21 mg total) onto the skin daily. (Patient not taking: Reported on 07/05/2016) 28 patch 1   No current facility-administered medications for this visit.    Review of Systems  Constitutional: positive for fatigue Eyes: negative Ears, nose, mouth, throat, and face: negative Respiratory: positive for cough and pleurisy/chest pain Cardiovascular: negative Gastrointestinal: negative Genitourinary:negative Integument/breast: negative Hematologic/lymphatic: negative Musculoskeletal:negative Neurological: negative Behavioral/Psych: negative Endocrine: negative Allergic/Immunologic: negative  Physical Exam  PFX:TKWIO, healthy, no distress, well nourished, well developed and anxious SKIN: skin color, texture, turgor are normal, no rashes or significant lesions HEAD: Normocephalic, No masses, lesions, tenderness or abnormalities EYES: normal, PERRLA, Conjunctiva are pink and non-injected EARS: External ears normal, Canals clear OROPHARYNX:no exudate, no erythema and lips, buccal mucosa, and tongue normal  NECK: supple, no adenopathy, no JVD LYMPH:  no palpable lymphadenopathy, no hepatosplenomegaly BREAST:not examined LUNGS: clear to auscultation , and palpation HEART: regular rate & rhythm, no murmurs and no gallops ABDOMEN:abdomen soft, non-tender, normal bowel sounds and no masses or organomegaly BACK: No CVA tenderness, Range of motion is normal EXTREMITIES:no joint deformities, effusion, or inflammation, no edema  NEURO: alert & oriented x 3 with fluent speech, no focal motor/sensory deficits  PERFORMANCE STATUS: ECOG 1  LABORATORY DATA: Lab Results  Component Value Date   WBC 4.9 05/21/2019   HGB 14.4 05/21/2019   HCT 42.9 05/21/2019   MCV 98.4 05/21/2019   PLT 344 05/21/2019      Chemistry      Component Value Date/Time   NA 142 05/21/2019 1338   NA  146 (H) 03/06/2018 1128   K 4.3 05/21/2019 1338   CL 104 05/21/2019 1338   CO2 25 05/21/2019 1338   BUN 9 05/21/2019 1338   BUN 8 03/06/2018 1128   CREATININE 1.00 05/21/2019 1338   CREATININE 0.85 05/11/2016 1157      Component Value Date/Time   CALCIUM 9.9 05/21/2019 1338   ALKPHOS 116 05/21/2019 1338   AST 16 05/21/2019 1338   ALT 11 05/21/2019 1338   BILITOT 0.4 05/21/2019 1338       RADIOGRAPHIC STUDIES: CT Chest W Contrast  Result Date: 04/27/2019 CLINICAL DATA:  Abnormality on a chest x-ray from April 01, 2019. Evaluate for mass versus infiltrate. EXAM: CT CHEST WITH CONTRAST TECHNIQUE: Multidetector CT imaging of the chest was performed during intravenous contrast administration. CONTRAST:  92mL OMNIPAQUE IOHEXOL 300 MG/ML  SOLN COMPARISON:  Chest x-ray April 01, 2019. FINDINGS: Cardiovascular: The heart size is normal. No obvious coronary artery calcifications are identified. The central pulmonary arteries are normal in caliber. The thoracic aorta demonstrates atherosclerotic change without aneurysm or dissection. Mediastinum/Nodes: Subcutaneous nodularity is seen along the lateral right breast as seen on  images 5 and 12 of series 3, measuring up to 19 mm. I suspect these are sebaceous or inclusion cysts. The chest wall is otherwise unremarkable. No adenopathy in the base of neck or axillary regions. No mediastinal adenopathy is noted. The left hilum is without adenopathy as well. There is a mass in the right infrahilar region measuring 5.3 x 3.6 by 3.2 cm. It is unclear whether this mass is centered in the lung or hilum. There is soft tissue nodularity within the right infrahilar region which could represent extension of the mass or a right hilar node on series 3, image 70 measuring 1.1 x 1.5 cm. No effusions. The thyroid is normal. The esophagus is normal. Lungs/Pleura: The trachea and mainstem bronchi are normal. The mass either within the inferior right hilum or the adjacent  lung encases the right middle lobe bronchus with abrupt cut off and collapse of the right middle lobe. Other airways are normal. No pneumothorax. Emphysematous changes are seen, particularly in the apices. No nodules or masses are seen in the left lung. There is platelike opacity in the anterior right lower lobe consistent with atelectasis. There is a nodule medially in the right chest on series 4, image 60, within the superior segment of the right lower lobe. This nodule measures 12 mm. A few scattered small nodules are seen in the right upper lobe such as on series 4, images 27, 43, 48, and 51. A few tiny nodules are seen in the left upper lobe such as on series 4, images 23, 26, and 30. Upper Abdomen: Images of the liver very noisy. A small lesion in the dome is not excluded on series 3, image 105 measuring 10 mm. The upper abdomen is otherwise normal. Musculoskeletal: No definite bony metastatic disease is identified. IMPRESSION: 1. There is a mass in the right infrahilar region which could arise from lung or the hilum itself. The finding is consistent with a primary lung malignancy. The mass occludes the right middle lobe bronchus centrally with right middle lobe collapse. The mass encases pulmonary arterial branches supplying the right middle lobe. 2. There is a nodule in the medial aspect of the right lower lobe superior segment which could represent a second primary malignancy or a metastatic lesion. PET-CT may better evaluate. 3. Soft tissue in the right hilum on series 3, image 70 could represent extension of the primary mass versus a right hilar node. 4. Multiple small nodules in the bilateral upper lobes, right greater than left, are nonspecific. 5. Limited visualization of the liver dome. A subtle lesion is not excluded measuring 10 mm on series 3, image 11. Dedicated imaging of the liver could better evaluate. An MRI would be most sensitive. 6. Atherosclerotic change in the thoracic aorta. 7.  Subcutaneous nodularity along the lateral right breast is favored to represent sebaceous or inclusion cysts. 8. Emphysematous changes, particularly in the apices. These results will be called to the ordering clinician or representative by the Radiologist Assistant, and communication documented in the PACS or zVision Dashboard. Aortic Atherosclerosis (ICD10-I70.0) and Emphysema (ICD10-J43.9). Electronically Signed   By: Dorise Bullion III M.D   On: 04/27/2019 14:39    ASSESSMENT: This is a very pleasant 64 years old African-American female with highly suspicious lung cancer presented with right hilar mass with occlusion of the right middle lobe bronchus centrally with right middle lobe collapse.  There are some other small pulmonary nodules.  PLAN: I had a lengthy discussion with the patient today about her  current condition and further investigation to confirm her diagnosis and potential treatment options. I personally and independently reviewed the scan images and discussed the result and showed the images to the patient today. I recommended for the patient to have a PET scan performed next week for evaluation of the lung mass and to rule out any other metastatic disease. I will arrange for the patient to come back for follow-up visit in less than 2 weeks for evaluation and recommendation regarding biopsy and treatment options. The patient agreed to the current plan. For hypertension, she was advised to monitor her blood pressure closely at home and to report to her primary care physician for consideration of treatment if needed. The patient was advised to call immediately if she has any concerning symptoms in the interval. The patient voices understanding of current disease status and treatment options and is in agreement with the current care plan.  All questions were answered. The patient knows to call the clinic with any problems, questions or concerns. We can certainly see the patient much  sooner if necessary.  Thank you so much for allowing me to participate in the care of Janice Adams. I will continue to follow up the patient with you and assist in her care.  I spent 50 minutes counseling the patient face to face. The total time spent in the appointment was 70 minutes.  Disclaimer: This note was dictated with voice recognition software. Similar sounding words can inadvertently be transcribed and may not be corrected upon review.   Eilleen Kempf May 21, 2019, 2:50 PM

## 2019-05-22 ENCOUNTER — Telehealth: Payer: Self-pay | Admitting: Internal Medicine

## 2019-05-22 NOTE — Telephone Encounter (Signed)
Scheduled per los. Called and spoke with patient. Confirmed appt 

## 2019-05-29 ENCOUNTER — Institutional Professional Consult (permissible substitution): Payer: Medicaid Other | Admitting: Internal Medicine

## 2019-05-30 ENCOUNTER — Ambulatory Visit (HOSPITAL_COMMUNITY): Payer: Medicaid Other

## 2019-05-30 ENCOUNTER — Other Ambulatory Visit: Payer: Self-pay

## 2019-05-30 ENCOUNTER — Encounter: Payer: Self-pay | Admitting: Pulmonary Disease

## 2019-05-30 ENCOUNTER — Telehealth: Payer: Self-pay | Admitting: Pulmonary Disease

## 2019-05-30 ENCOUNTER — Ambulatory Visit: Payer: Medicaid Other | Admitting: Pulmonary Disease

## 2019-05-30 VITALS — BP 130/80 | HR 125 | Temp 97.2°F | Ht 64.0 in | Wt 197.4 lb

## 2019-05-30 DIAGNOSIS — R918 Other nonspecific abnormal finding of lung field: Secondary | ICD-10-CM | POA: Diagnosis not present

## 2019-05-30 NOTE — Progress Notes (Signed)
Subjective:   PATIENT ID: Janice Adams GENDER: female DOB: [birth year], MRN: [id number]   HPI  Chief Complaint  Patient presents with  . Consult    abnormal xray   Reason for Visit: New consult for lung mass  Ms. Janice Adams is a 64 year old female with significant tobacco history and GERD who presents for right hilar lung mass.  Reviewed 05/21/2019 oncology note by Dr. Star Age was initially seen by PCP in November 2020 for shortness of breath, cough and chest tightness.  Chest imaging on 04/27/2019 demonstrated right infrahilar mass measuring 5.3 x 3.6 x 3.2 cm and right middle lobe occlusion and collapse.  When seen by oncology she had cough and chest tightness. Referred to Pulmonary.  She report vague right-mid chest pain. Denies cough, hemoptysis, shortness of breath. She reports fatigue and occasional expiratory wheezing. Denies activity limitations. She lives with her teenage grandchildren.   Her mother and grandmother have passed away from breast cancer. Her last mammogram was in 2018 and has previously been normal in the past.  Social History: Active smoker. 1/2 ppd x 40 years. Trying to cut back.  I have personally reviewed patient's past medical/family/social history, allergies, current medications.  Past Medical History:  Diagnosis Date  . GERD (gastroesophageal reflux disease)   . History of epigastric pain    comes and goes     Family History  Problem Relation Age of Onset  . Breast cancer Mother   . Cancer Mother   . Seizures Father   . Breast cancer Maternal Grandmother   . Breast cancer Daughter   . Cancer Daughter      Social History   Occupational History  . Occupation: Childcare  Tobacco Use  . Smoking status: Current Some Day Smoker    Packs/day: 0.50    Types: Cigarettes  . Smokeless tobacco: Never Used  . Tobacco comment: Smoking 5 cigs/day  Substance and Sexual Activity  . Alcohol use: No    Alcohol/week: 0.0 standard  drinks  . Drug use: No    Types: Marijuana  . Sexual activity: Not on file    No Known Allergies   Outpatient Medications Prior to Visit  Medication Sig Dispense Refill  . amLODipine (NORVASC) 5 MG tablet Take 1 tablet (5 mg total) by mouth daily. To lower blood pressure (Patient not taking: Reported on 05/21/2019) 30 tablet 3  . HYDROcodone-acetaminophen (NORCO) 5-325 MG per tablet Take 1 tablet by mouth every 6 (six) hours as needed. (Patient not taking: Reported on 05/11/2016) 15 tablet 0  . indomethacin (INDOCIN) 50 MG capsule Take 1 capsule (50 mg total) by mouth 2 (two) times daily with a meal. As needed for gout pain 30 capsule 0  . nicotine (NICODERM CQ - DOSED IN MG/24 HOURS) 21 mg/24hr patch Place 1 patch (21 mg total) onto the skin daily. (Patient not taking: Reported on 07/05/2016) 28 patch 1  . omeprazole (PRILOSEC) 20 MG capsule Take 1 capsule (20 mg total) by mouth 2 (two) times daily before a meal. To lower stomach acid 60 capsule 5   No facility-administered medications prior to visit.    Review of Systems  Constitutional: Negative for chills, diaphoresis, fever, malaise/fatigue and weight loss.  HENT: Negative for congestion, ear pain and sore throat.   Respiratory: Positive for shortness of breath. Negative for cough, hemoptysis, sputum production and wheezing.   Cardiovascular: Positive for chest pain. Negative for palpitations and leg swelling.  Gastrointestinal: Negative for abdominal pain, heartburn and nausea.  Genitourinary: Negative for frequency.  Musculoskeletal: Negative for joint pain and myalgias.  Skin: Negative for itching and rash.  Neurological: Negative for dizziness, weakness and headaches.  Endo/Heme/Allergies: Does not bruise/bleed easily.  Psychiatric/Behavioral: Negative for depression. The patient is nervous/anxious.      Objective:   Vitals:   05/30/19 1358  BP: 130/80  Pulse: (!) 125  Temp: (!) 97.2 F (36.2 C)  TempSrc: Temporal    SpO2: 95%  Weight: 197 lb 6.4 oz (89.5 kg)  Height: 5\' 4"  (1.626 m)     Physical Exam: General: Well-appearing, no acute distress HENT: [place], AT Eyes: EOMI, no scleral icterus Respiratory: Clear to auscultation bilaterally.  No crackles, wheezing or rales Cardiovascular: RRR, -M/R/G, no JVD GI: BS+, soft, nontender Extremities:-Edema,-tenderness Neuro: AAO x4, CNII-XII grossly intact Skin: Intact, no rashes or bruising Psych: Normal mood, normal affect  Data Reviewed:  Imaging: CT chest 04/27/2019-right infrahilar mass measured 5.3 x 3.6 x 3.2 cm with extension  PFT: None on file  Labs: CBC    Component Value Date/Time   WBC 4.9 05/21/2019 1338   WBC 4.6 05/11/2016 1157   RBC 4.36 05/21/2019 1338   HGB 14.4 05/21/2019 1338   HGB 13.9 03/06/2018 1128   HCT 42.9 05/21/2019 1338   HCT 41.8 03/06/2018 1128   PLT 344 05/21/2019 1338   PLT 337 03/06/2018 1128   MCV 98.4 05/21/2019 1338   MCV 94 03/06/2018 1128   MCH 33.0 05/21/2019 1338   MCHC 33.6 05/21/2019 1338   RDW 13.3 05/21/2019 1338   RDW 12.3 03/06/2018 1128   LYMPHSABS 2.0 05/21/2019 1338   LYMPHSABS 1.7 03/06/2018 1128   MONOABS 0.4 05/21/2019 1338   EOSABS 0.1 05/21/2019 1338   EOSABS 0.0 03/06/2018 1128   BASOSABS 0.0 05/21/2019 1338   BASOSABS 0.0 03/06/2018 1128   BMET    Component Value Date/Time   NA 142 05/21/2019 1338   NA 146 (H) 03/06/2018 1128   K 4.3 05/21/2019 1338   CL 104 05/21/2019 1338   CO2 25 05/21/2019 1338   GLUCOSE 91 05/21/2019 1338   BUN 9 05/21/2019 1338   BUN 8 03/06/2018 1128   CREATININE 1.00 05/21/2019 1338   CREATININE 0.85 05/11/2016 1157   CALCIUM 9.9 05/21/2019 1338   GFRNONAA 59 (L) 05/21/2019 1338   GFRNONAA 73 11/08/2012 1212   GFRAA >60 05/21/2019 1338   GFRAA 84 11/08/2012 1212   Imaging, labs and tests noted above have been reviewed independently by me.    Assessment & Plan:   Discussion: 64 year old female active smoker with GERD who presents with  right hilar lung mass.   Right infrahilar lung mass Based on imaging, lung mass is highly concerning for malignancy. May potentially represent infection. We discussed diagnostic testing including CT-guided biopsy, bronchoscopy and surgery vs conservative management with further imaging. After addressing patient/family's questions, patient wishes to pursue diagnostic testing via bronchoscopy. We discussed risks and benefits of procedure including infection, bleeding and lung collapse. Patient consented to procedure. Coordinated procedure with RN and OR scheduler. --Will arrange for EBUS to tissue sampling  Tobacco abuse Patient is an active smoker. We discussed smoking cessation for 8 minutes. We discussed triggers and stressors and ways to deal with them. We discussed barriers to continued smoking and benefits of smoking cessation. Provided patient with information cessation techniques and interventions including [place] quitline. --Chantix provided  Health Maintenance  There is no immunization history on file for this patient.   CT Lung  Screen - not qualified  Orders Placed This Encounter  Procedures  . Ambulatory referral to Pulmonology    Referral Priority:   Routine    Referral Type:   Consultation    Referral Reason:   Specialty Services Required    Requested Specialty:   Pulmonary Disease    Number of Visits Requested:   1  No orders of the defined types were placed in this encounter.   No follow-ups on file.  I have spent a total time of 60-minutes on the day of the appointment reviewing prior documentation, coordinating care and discussing medical diagnosis and plan with the patient/family. Imaging, labs and tests included in this note have been reviewed and interpreted independently by me.  Rodney [place], MD [facility] 05/30/2019 1:33 PM  Office Number [phone]

## 2019-05-30 NOTE — Patient Instructions (Addendum)
Right Lung Mass We will arrange for bronchoscopy for tissue sampling. Please await our call for scheduling of the procedure. For any questions, please call our office at [phone].      Tobacco Use Disorder Tobacco use disorder (TUD) occurs when a person craves, seeks, and uses tobacco, regardless of the consequences. This disorder can cause problems with mental and physical health. It can affect your ability to have healthy relationships, and it can keep you from meeting your responsibilities at work, home, or school. Tobacco may be:  Smoked as a cigarette or cigar.  Inhaled using e-cigarettes.  Smoked in a pipe or hookah.  Chewed as smokeless tobacco.  Inhaled into the nostrils as snuff. Tobacco products contain a dangerous chemical called nicotine, which is very addictive. Nicotine triggers hormones that make the body feel stimulated and works on areas of the brain that make you feel good. These effects can make it hard for people to quit nicotine. Tobacco contains many other unsafe chemicals that can damage almost every organ in the body. Smoking tobacco also puts others in danger due to fire risk and possible health problems caused by breathing in secondhand smoke. What are the signs or symptoms? Symptoms of TUD may include:  Being unable to slow down or stop your tobacco use.  Spending an abnormal amount of time getting or using tobacco.  Craving tobacco. Cravings may last for up to 6 months after quitting.  Tobacco use that: ? Interferes with your work, school, or home life. ? Interferes with your personal and social relationships. ? Makes you give up activities that you once enjoyed or found important.  Using tobacco even though you know that it is: ? Dangerous or bad for your health or someone else's health. ? Causing problems in your life.  Needing more and more of the substance to get the same effect (developing tolerance).  Experiencing unpleasant symptoms  if you do not use the substance (withdrawal). Withdrawal symptoms may include: ? Depressed, anxious, or irritable mood. ? Difficulty concentrating. ? Increased appetite. ? Restlessness or trouble sleeping.  Using the substance to avoid withdrawal. How is this diagnosed? This condition may be diagnosed based on:  Your current and past tobacco use. Your health care provider may ask questions about how your tobacco use affects your life.  A physical exam. You may be diagnosed with TUD if you have at least two symptoms within a 15-month period. How is this treated? This condition is treated by stopping tobacco use. Many people are unable to quit on their own and need help. Treatment may include:  Nicotine replacement therapy (NRT). NRT provides nicotine without the other harmful chemicals in tobacco. NRT gradually lowers the dosage of nicotine in the body and reduces withdrawal symptoms. NRT is available as: ? Over-the-counter gums, lozenges, and skin patches. ? Prescription mouth inhalers and nasal sprays.  Medicine that acts on the brain to reduce cravings and withdrawal symptoms.  A type of talk therapy that examines your triggers for tobacco use, how to avoid them, and how to cope with cravings (behavioral therapy).  Hypnosis. This may help with withdrawal symptoms.  Joining a support group for others coping with TUD. The best treatment for TUD is usually a combination of medicine, talk therapy, and support groups. Recovery can be a long process. Many people start using tobacco again after stopping (relapse). If you relapse, it does not mean that treatment will not work. Follow these instructions at home:  Lifestyle  Do not  use any products that contain nicotine or tobacco, such as cigarettes and e-cigarettes.  Avoid things that trigger tobacco use as much as you can. Triggers include people and situations that usually cause you to use tobacco.  Avoid drinks that contain  caffeine, including coffee. These may worsen some withdrawal symptoms.  Find ways to manage stress. Wanting to smoke may cause stress, and stress can make you want to smoke. Relaxation techniques such as deep breathing, meditation, and yoga may help.  Attend support groups as needed. These groups are an important part of long-term recovery for many people. General instructions  Take over-the-counter and prescription medicines only as told by your health care provider.  Check with your health care provider before taking any new prescription or over-the-counter medicines.  Decide on a friend, family member, or smoking quit-line (such as 1-800-QUIT-NOW in the [place] that you can call or text when you feel the urge to smoke or when you need help coping with cravings.  Keep all follow-up visits as told by your health care provider and therapist. This is important. Contact a health care provider if:  You are not able to take your medicines as prescribed.  Your symptoms get worse, even with treatment. Summary  Tobacco use disorder (TUD) occurs when a person craves, seeks, and uses tobacco regardless of the consequences.  This condition may be diagnosed based on your current and past tobacco use and a physical exam.  Many people are unable to quit on their own and need help. Recovery can be a long process.  The most effective treatment for TUD is usually a combination of medicine, talk therapy, and support groups. This information is not intended to replace advice given to you by your health care provider. Make sure you discuss any questions you have with your health care provider. Document Revised: 04/12/2017 Document Reviewed: 04/12/2017 Elsevier Patient Education  2020 [employer].

## 2019-05-30 NOTE — H&P (View-Only) (Signed)
Subjective:   PATIENT ID: Janice Adams GENDER: female DOB: [birth year], MRN: [id number]   HPI  Chief Complaint  Patient presents with  . Consult    abnormal xray   Reason for Visit: New consult for lung mass  Ms. Janice Adams is a 64 year old female with significant tobacco history and GERD who presents for right hilar lung mass.  Reviewed 05/21/2019 oncology note by Dr. Star Age was initially seen by PCP in November 2020 for shortness of breath, cough and chest tightness.  Chest imaging on 04/27/2019 demonstrated right infrahilar mass measuring 5.3 x 3.6 x 3.2 cm and right middle lobe occlusion and collapse.  When seen by oncology she had cough and chest tightness. Referred to Pulmonary.  She report vague right-mid chest pain. Denies cough, hemoptysis, shortness of breath. She reports fatigue and occasional expiratory wheezing. Denies activity limitations. She lives with her teenage grandchildren.   Her mother and grandmother have passed away from breast cancer. Her last mammogram was in 2018 and has previously been normal in the past.  Social History: Active smoker. 1/2 ppd x 40 years. Trying to cut back.  I have personally reviewed patient's past medical/family/social history, allergies, current medications.  Past Medical History:  Diagnosis Date  . GERD (gastroesophageal reflux disease)   . History of epigastric pain    comes and goes     Family History  Problem Relation Age of Onset  . Breast cancer Mother   . Cancer Mother   . Seizures Father   . Breast cancer Maternal Grandmother   . Breast cancer Daughter   . Cancer Daughter      Social History   Occupational History  . Occupation: Childcare  Tobacco Use  . Smoking status: Current Some Day Smoker    Packs/day: 0.50    Types: Cigarettes  . Smokeless tobacco: Never Used  . Tobacco comment: Smoking 5 cigs/day  Substance and Sexual Activity  . Alcohol use: No    Alcohol/week: 0.0 standard  drinks  . Drug use: No    Types: Marijuana  . Sexual activity: Not on file    No Known Allergies   Outpatient Medications Prior to Visit  Medication Sig Dispense Refill  . amLODipine (NORVASC) 5 MG tablet Take 1 tablet (5 mg total) by mouth daily. To lower blood pressure (Patient not taking: Reported on 05/21/2019) 30 tablet 3  . HYDROcodone-acetaminophen (NORCO) 5-325 MG per tablet Take 1 tablet by mouth every 6 (six) hours as needed. (Patient not taking: Reported on 05/11/2016) 15 tablet 0  . indomethacin (INDOCIN) 50 MG capsule Take 1 capsule (50 mg total) by mouth 2 (two) times daily with a meal. As needed for gout pain 30 capsule 0  . nicotine (NICODERM CQ - DOSED IN MG/24 HOURS) 21 mg/24hr patch Place 1 patch (21 mg total) onto the skin daily. (Patient not taking: Reported on 07/05/2016) 28 patch 1  . omeprazole (PRILOSEC) 20 MG capsule Take 1 capsule (20 mg total) by mouth 2 (two) times daily before a meal. To lower stomach acid 60 capsule 5   No facility-administered medications prior to visit.    Review of Systems  Constitutional: Negative for chills, diaphoresis, fever, malaise/fatigue and weight loss.  HENT: Negative for congestion, ear pain and sore throat.   Respiratory: Positive for shortness of breath. Negative for cough, hemoptysis, sputum production and wheezing.   Cardiovascular: Positive for chest pain. Negative for palpitations and leg swelling.  Gastrointestinal: Negative for abdominal pain, heartburn and nausea.  Genitourinary: Negative for frequency.  Musculoskeletal: Negative for joint pain and myalgias.  Skin: Negative for itching and rash.  Neurological: Negative for dizziness, weakness and headaches.  Endo/Heme/Allergies: Does not bruise/bleed easily.  Psychiatric/Behavioral: Negative for depression. The patient is nervous/anxious.      Objective:   Vitals:   05/30/19 1358  BP: 130/80  Pulse: (!) 125  Temp: (!) 97.2 F (36.2 C)  TempSrc: Temporal    SpO2: 95%  Weight: 197 lb 6.4 oz (89.5 kg)  Height: 5\' 4"  (1.626 m)     Physical Exam: General: Well-appearing, no acute distress HENT: [place], AT Eyes: EOMI, no scleral icterus Respiratory: Clear to auscultation bilaterally.  No crackles, wheezing or rales Cardiovascular: RRR, -M/R/G, no JVD GI: BS+, soft, nontender Extremities:-Edema,-tenderness Neuro: AAO x4, CNII-XII grossly intact Skin: Intact, no rashes or bruising Psych: Normal mood, normal affect  Data Reviewed:  Imaging: CT chest 04/27/2019-right infrahilar mass measured 5.3 x 3.6 x 3.2 cm with extension  PFT: None on file  Labs: CBC    Component Value Date/Time   WBC 4.9 05/21/2019 1338   WBC 4.6 05/11/2016 1157   RBC 4.36 05/21/2019 1338   HGB 14.4 05/21/2019 1338   HGB 13.9 03/06/2018 1128   HCT 42.9 05/21/2019 1338   HCT 41.8 03/06/2018 1128   PLT 344 05/21/2019 1338   PLT 337 03/06/2018 1128   MCV 98.4 05/21/2019 1338   MCV 94 03/06/2018 1128   MCH 33.0 05/21/2019 1338   MCHC 33.6 05/21/2019 1338   RDW 13.3 05/21/2019 1338   RDW 12.3 03/06/2018 1128   LYMPHSABS 2.0 05/21/2019 1338   LYMPHSABS 1.7 03/06/2018 1128   MONOABS 0.4 05/21/2019 1338   EOSABS 0.1 05/21/2019 1338   EOSABS 0.0 03/06/2018 1128   BASOSABS 0.0 05/21/2019 1338   BASOSABS 0.0 03/06/2018 1128   BMET    Component Value Date/Time   NA 142 05/21/2019 1338   NA 146 (H) 03/06/2018 1128   K 4.3 05/21/2019 1338   CL 104 05/21/2019 1338   CO2 25 05/21/2019 1338   GLUCOSE 91 05/21/2019 1338   BUN 9 05/21/2019 1338   BUN 8 03/06/2018 1128   CREATININE 1.00 05/21/2019 1338   CREATININE 0.85 05/11/2016 1157   CALCIUM 9.9 05/21/2019 1338   GFRNONAA 59 (L) 05/21/2019 1338   GFRNONAA 73 11/08/2012 1212   GFRAA >60 05/21/2019 1338   GFRAA 84 11/08/2012 1212   Imaging, labs and tests noted above have been reviewed independently by me.    Assessment & Plan:   Discussion: 64 year old female active smoker with GERD who presents with  right hilar lung mass.   Right infrahilar lung mass Based on imaging, lung mass is highly concerning for malignancy. May potentially represent infection. We discussed diagnostic testing including CT-guided biopsy, bronchoscopy and surgery vs conservative management with further imaging. After addressing patient/family's questions, patient wishes to pursue diagnostic testing via bronchoscopy. We discussed risks and benefits of procedure including infection, bleeding and lung collapse. Patient consented to procedure. Coordinated procedure with RN and OR scheduler. --Will arrange for EBUS to tissue sampling  Tobacco abuse Patient is an active smoker. We discussed smoking cessation for 8 minutes. We discussed triggers and stressors and ways to deal with them. We discussed barriers to continued smoking and benefits of smoking cessation. Provided patient with information cessation techniques and interventions including [place] quitline. --Chantix provided  Health Maintenance  There is no immunization history on file for this patient.   CT Lung  Screen - not qualified  Orders Placed This Encounter  Procedures  . Ambulatory referral to Pulmonology    Referral Priority:   Routine    Referral Type:   Consultation    Referral Reason:   Specialty Services Required    Requested Specialty:   Pulmonary Disease    Number of Visits Requested:   1  No orders of the defined types were placed in this encounter.   No follow-ups on file.  I have spent a total time of 60-minutes on the day of the appointment reviewing prior documentation, coordinating care and discussing medical diagnosis and plan with the patient/family. Imaging, labs and tests included in this note have been reviewed and interpreted independently by me.  Antoine, MD [facility] 05/30/2019 1:33 PM  Office Number [phone]

## 2019-05-30 NOTE — Telephone Encounter (Signed)
ATC pt x 2. Line rang busy. WCB.

## 2019-05-31 ENCOUNTER — Encounter (HOSPITAL_COMMUNITY): Payer: Self-pay | Admitting: Pulmonary Disease

## 2019-05-31 ENCOUNTER — Telehealth: Payer: Self-pay | Admitting: *Deleted

## 2019-05-31 ENCOUNTER — Other Ambulatory Visit: Payer: Self-pay

## 2019-05-31 ENCOUNTER — Telehealth: Payer: Self-pay | Admitting: Pulmonary Disease

## 2019-05-31 ENCOUNTER — Other Ambulatory Visit (HOSPITAL_COMMUNITY)
Admission: RE | Admit: 2019-05-31 | Discharge: 2019-05-31 | Disposition: A | Discharge status: Home / Self Care | Payer: Medicaid Other | Source: Ambulatory Visit | Attending: Pulmonary Disease | Admitting: Pulmonary Disease

## 2019-05-31 DIAGNOSIS — Z01812 Encounter for preprocedural laboratory examination: Secondary | ICD-10-CM | POA: Insufficient documentation

## 2019-05-31 DIAGNOSIS — Z20822 Contact with and (suspected) exposure to covid-19: Secondary | ICD-10-CM | POA: Diagnosis not present

## 2019-05-31 LAB — SARS CORONAVIRUS 2 (TAT 6-24 HRS): SARS Coronavirus 2: NEGATIVE

## 2019-05-31 MED ORDER — VARENICLINE TARTRATE 0.5 MG X 11 & 1 MG X 42 PO MISC: ORAL | 0 refills | Status: DC

## 2019-05-31 NOTE — Telephone Encounter (Signed)
I called pt and advised he rthat her appt time was at 8:15. I advised her to arrive 15 mins early for check in procedures. Pt understood and nothing further is needed.    Case Information 06/04/2019   General Information  Date: 06/04/2019 Time: 0815 Status: Scheduled  Location: [place] OR Room: [facility] OR ROOM 10 Service: Thoracic  Patient class: Ambulatory Surgery Case classification: Scheduled   Panel Information  Panel 1  Surgeon Role  Margaretha Seeds, MD Primary   Procedure Laterality Anesthesia  VIDEO BRONCHOSCOPY WITH ENDOBRONCHIAL ULTRASOUND N/A General

## 2019-05-31 NOTE — Telephone Encounter (Signed)
Oncology Nurse Navigator Documentation  Oncology Nurse Navigator Flowsheets 05/31/2019  Navigator Location [facility]  Navigator Encounter Type Telephone/I followed up on Janice Adams and her auth for PET scan.  Scan is auth.  I called patient to see if she would like me to help her make the appt for PET scan and she stated yes.  She also asked what time she needed to be at hospital for her bx.  I updated her that I was not certain but provided her the phone number to call for an update.  I called central scheduling and received an appt time and date for PET scan.  I called patient back and she states she is on hold with pulmonary to get an update on her bx time.  I stated I will call her back with an update on her PET scan.   Telephone Outgoing Call  Treatment Phase Abnormal Scans  Barriers/Navigation Needs Coordination of Care;Education  Education Other  Interventions Coordination of Care;Education  Acuity Level 2-Minimal Needs (1-2 Barriers Identified)  Coordination of Care Appts;Other  Education Method Verbal  Time Spent with Patient 45

## 2019-05-31 NOTE — Telephone Encounter (Signed)
Spoke with pt, she would like the Chantix called into her pharmacy [facility] on [address]. I changed the pharmacy but didn't see a Rx for Chantix. JE did you want to send in the starter pack for Chantix, please advise.

## 2019-05-31 NOTE — Telephone Encounter (Signed)
Chantix starter pack sent to the pharmacy and left patient a voicemail that script was sent.

## 2019-05-31 NOTE — Telephone Encounter (Signed)
Yes, please order starter pack

## 2019-05-31 NOTE — Progress Notes (Signed)
Patient denies shortness of breath, fever, cough and chest pain.  PCP - Dr Antony Blackbird Cardiologist -denies Oncology - Dr Earlie Server   Chest x-ray - 04/01/19, 2 view EKG - denies Stress Test - denies ECHO - denies denies Cardiac Cath - denies  STOP now taking any Aspirin (unless otherwise instructed by your surgeon), Aleve, Naproxen, Ibuprofen, Motrin, Advil, Goody's, BC's, all herbal medications, fish oil, and all vitamins.   Coronavirus Screening Have you experienced the following symptoms:  Cough yes/no: No Fever (>100.24F)  yes/no: No Runny nose yes/no: No Sore throat yes/no: No Difficulty breathing/shortness of breath  yes/no: No  Have you traveled in the last 14 days and where? yes/no: No  Patient verbalized understanding of instructions that were given via phone.

## 2019-06-03 ENCOUNTER — Encounter: Payer: Self-pay | Admitting: *Deleted

## 2019-06-03 NOTE — Anesthesia Preprocedure Evaluation (Addendum)
Anesthesia Evaluation  Patient identified by MRN, date of birth, ID band Patient awake    Reviewed: Allergy & Precautions, NPO status , Patient's Chart, lab work & pertinent test results  History of Anesthesia Complications Negative for: history of anesthetic complications  Airway Mallampati: II  TM Distance: >3 FB Neck ROM: Full    Dental  (+) Edentulous Upper, Edentulous Lower   Pulmonary Current Smoker and Patient abstained from smoking.,   Right lung mass    + rhonchi        Cardiovascular hypertension, Normal cardiovascular exam     Neuro/Psych negative neurological ROS  negative psych ROS   GI/Hepatic Neg liver ROS, GERD  Medicated and Controlled,  Endo/Other   Obesity   Renal/GU negative Renal ROS     Musculoskeletal  Gout    Abdominal   Peds  Hematology negative hematology ROS (+)   Anesthesia Other Findings Covid neg 1/22   Reproductive/Obstetrics                           Anesthesia Physical Anesthesia Plan  ASA: II  Anesthesia Plan: General   Post-op Pain Management:    Induction: Intravenous  PONV Risk Score and Plan: 3 and Treatment may vary due to age or medical condition, Ondansetron and Dexamethasone  Airway Management Planned: Oral ETT  Additional Equipment: None  Intra-op Plan:   Post-operative Plan: Extubation in OR  Informed Consent: I have reviewed the patients History and Physical, chart, labs and discussed the procedure including the risks, benefits and alternatives for the proposed anesthesia with the patient or authorized representative who has indicated his/her understanding and acceptance.     Dental advisory given  Plan Discussed with: CRNA and Anesthesiologist  Anesthesia Plan Comments:        Anesthesia Quick Evaluation

## 2019-06-03 NOTE — Progress Notes (Signed)
Oncology Nurse Navigator Documentation  Oncology Nurse Navigator Flowsheets 06/03/2019  Navigator Location [facility]  Navigator Encounter Type Other/I followed up on Janice Adams's schedule.  She is set up for bronch and PET scan but needs a follow up with Janice Adams.  I sent a scheduling message to have patient come to see Janice Adams on 06/12/19 with labs at his available times.   Telephone -  Treatment Phase Abnormal Scans  Barriers/Navigation Needs Coordination of Care  Education Other  Interventions Coordination of Care  Acuity Level 2-Minimal Needs (1-2 Barriers Identified)  Coordination of Care Other  Education Method -  Time Spent with Patient 15

## 2019-06-04 ENCOUNTER — Ambulatory Visit (HOSPITAL_COMMUNITY): Payer: Medicaid Other | Admitting: Certified Registered"

## 2019-06-04 ENCOUNTER — Encounter: Payer: Self-pay | Admitting: Internal Medicine

## 2019-06-04 ENCOUNTER — Other Ambulatory Visit: Payer: Self-pay

## 2019-06-04 ENCOUNTER — Inpatient Hospital Stay (HOSPITAL_BASED_OUTPATIENT_CLINIC_OR_DEPARTMENT_OTHER): Payer: Medicaid Other | Admitting: Internal Medicine

## 2019-06-04 ENCOUNTER — Ambulatory Visit (HOSPITAL_COMMUNITY)
Admission: RE | Admit: 2019-06-04 | Discharge: 2019-06-04 | Disposition: A | Discharge status: Home / Self Care | Payer: Medicaid Other | Attending: Pulmonary Disease | Admitting: Pulmonary Disease

## 2019-06-04 ENCOUNTER — Encounter (HOSPITAL_COMMUNITY)
Admission: RE | Disposition: A | Discharge status: Home / Self Care | Payer: Self-pay | Source: Home / Self Care | Attending: Pulmonary Disease

## 2019-06-04 ENCOUNTER — Ambulatory Visit (HOSPITAL_COMMUNITY): Payer: Medicaid Other

## 2019-06-04 ENCOUNTER — Encounter (HOSPITAL_COMMUNITY): Payer: Self-pay | Admitting: Pulmonary Disease

## 2019-06-04 VITALS — BP 143/82 | HR 75 | Temp 98.0°F | Resp 20 | Ht 64.0 in | Wt 193.7 lb

## 2019-06-04 DIAGNOSIS — R918 Other nonspecific abnormal finding of lung field: Secondary | ICD-10-CM | POA: Diagnosis present

## 2019-06-04 DIAGNOSIS — M109 Gout, unspecified: Secondary | ICD-10-CM | POA: Insufficient documentation

## 2019-06-04 DIAGNOSIS — Z6833 Body mass index (BMI) 33.0-33.9, adult: Secondary | ICD-10-CM | POA: Insufficient documentation

## 2019-06-04 DIAGNOSIS — K219 Gastro-esophageal reflux disease without esophagitis: Secondary | ICD-10-CM | POA: Insufficient documentation

## 2019-06-04 DIAGNOSIS — C342 Malignant neoplasm of middle lobe, bronchus or lung: Secondary | ICD-10-CM | POA: Insufficient documentation

## 2019-06-04 DIAGNOSIS — I10 Essential (primary) hypertension: Secondary | ICD-10-CM | POA: Insufficient documentation

## 2019-06-04 DIAGNOSIS — F17200 Nicotine dependence, unspecified, uncomplicated: Secondary | ICD-10-CM

## 2019-06-04 DIAGNOSIS — F17210 Nicotine dependence, cigarettes, uncomplicated: Secondary | ICD-10-CM | POA: Insufficient documentation

## 2019-06-04 DIAGNOSIS — J9819 Other pulmonary collapse: Secondary | ICD-10-CM | POA: Insufficient documentation

## 2019-06-04 DIAGNOSIS — E669 Obesity, unspecified: Secondary | ICD-10-CM | POA: Diagnosis not present

## 2019-06-04 DIAGNOSIS — Z79899 Other long term (current) drug therapy: Secondary | ICD-10-CM | POA: Insufficient documentation

## 2019-06-04 DIAGNOSIS — Z01811 Encounter for preprocedural respiratory examination: Secondary | ICD-10-CM

## 2019-06-04 HISTORY — DX: Gout, unspecified: M10.9

## 2019-06-04 HISTORY — DX: Dorsalgia, unspecified: M54.9

## 2019-06-04 HISTORY — PX: VIDEO BRONCHOSCOPY WITH ENDOBRONCHIAL ULTRASOUND: SHX6177

## 2019-06-04 IMAGING — CR DG CHEST 2V
2 series · 2 of 2 positions shown · non-contrast
Comparison: [DATE]

CLINICAL DATA: Pre lung biopsy

EXAM:
CHEST - 2 VIEW

[w chest pa]
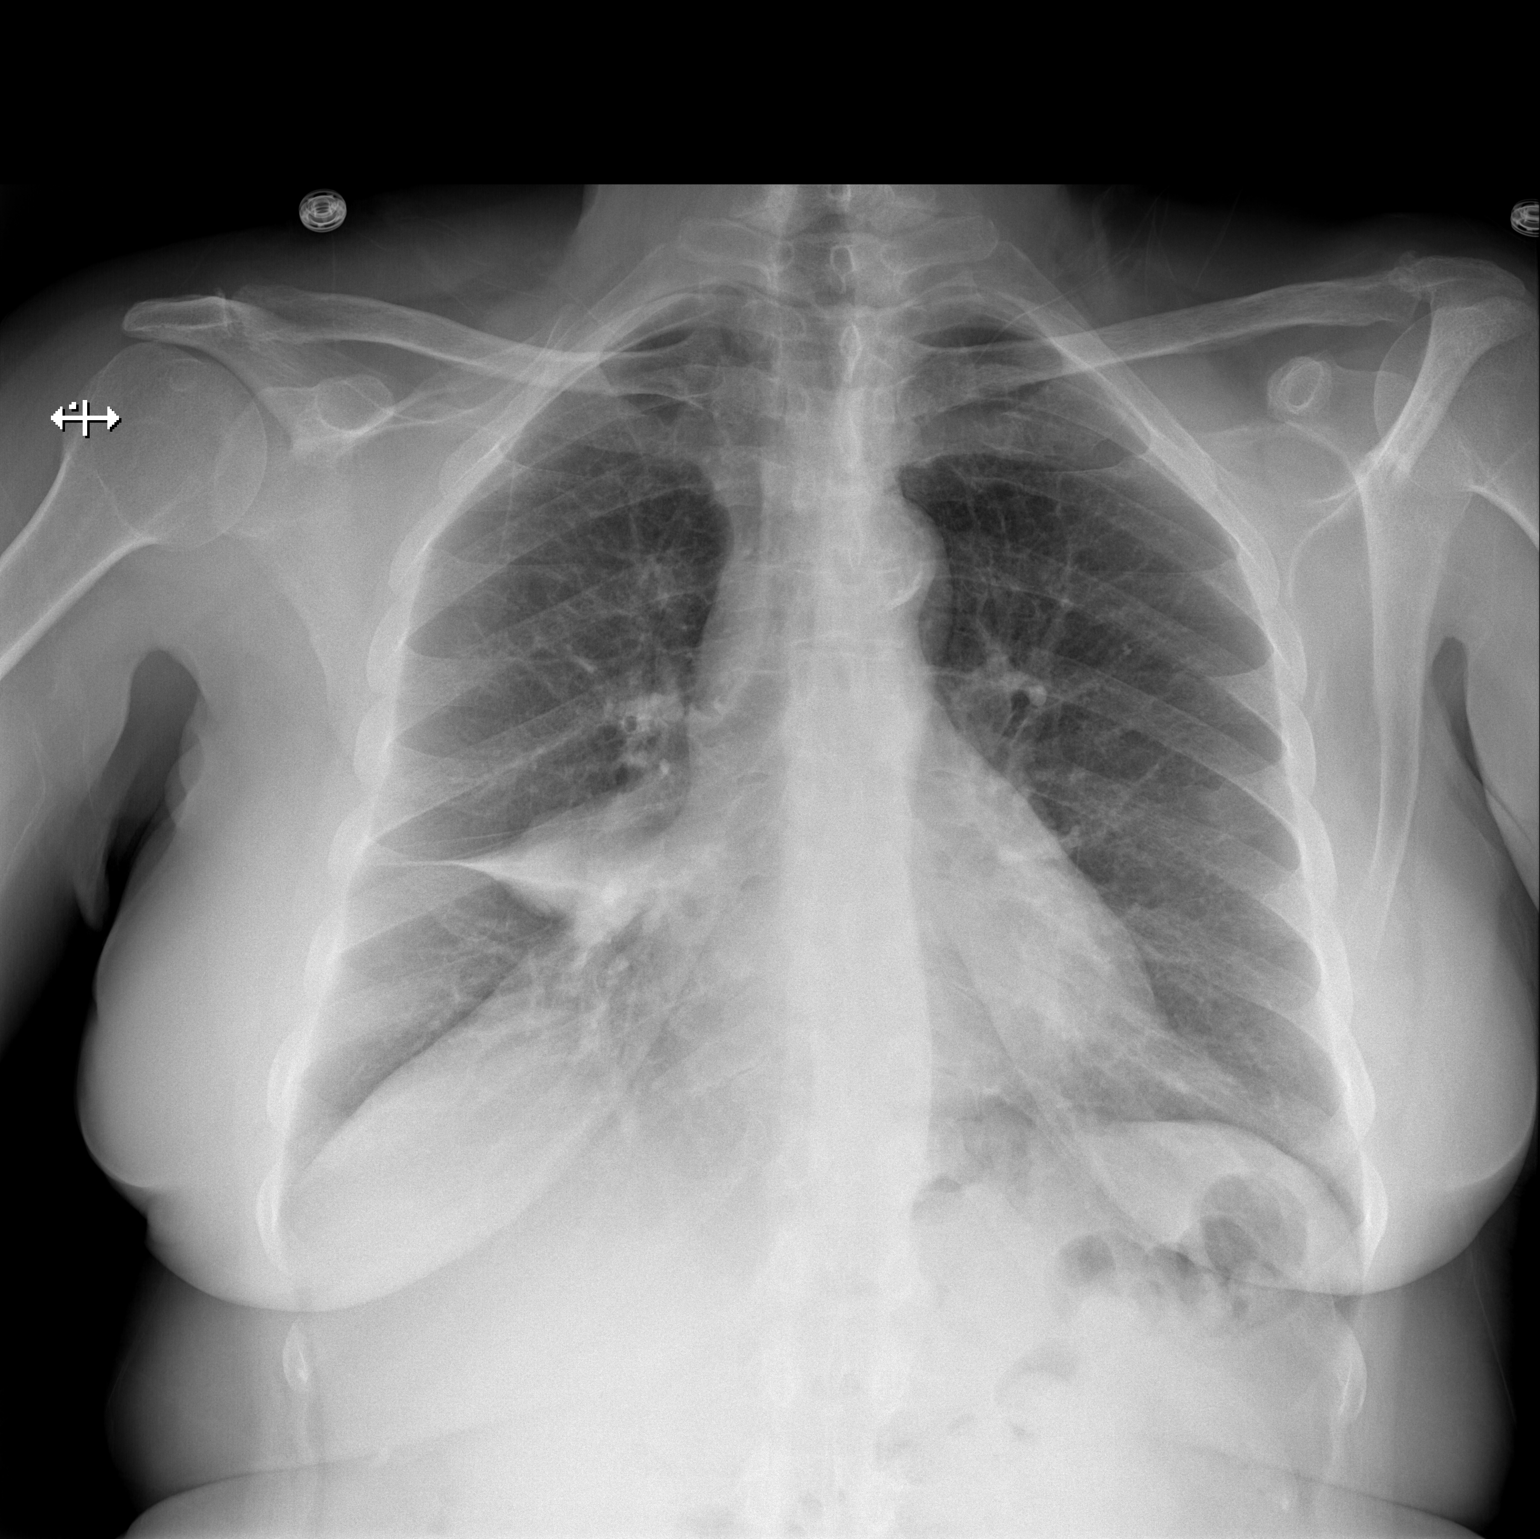

[w chest lat]
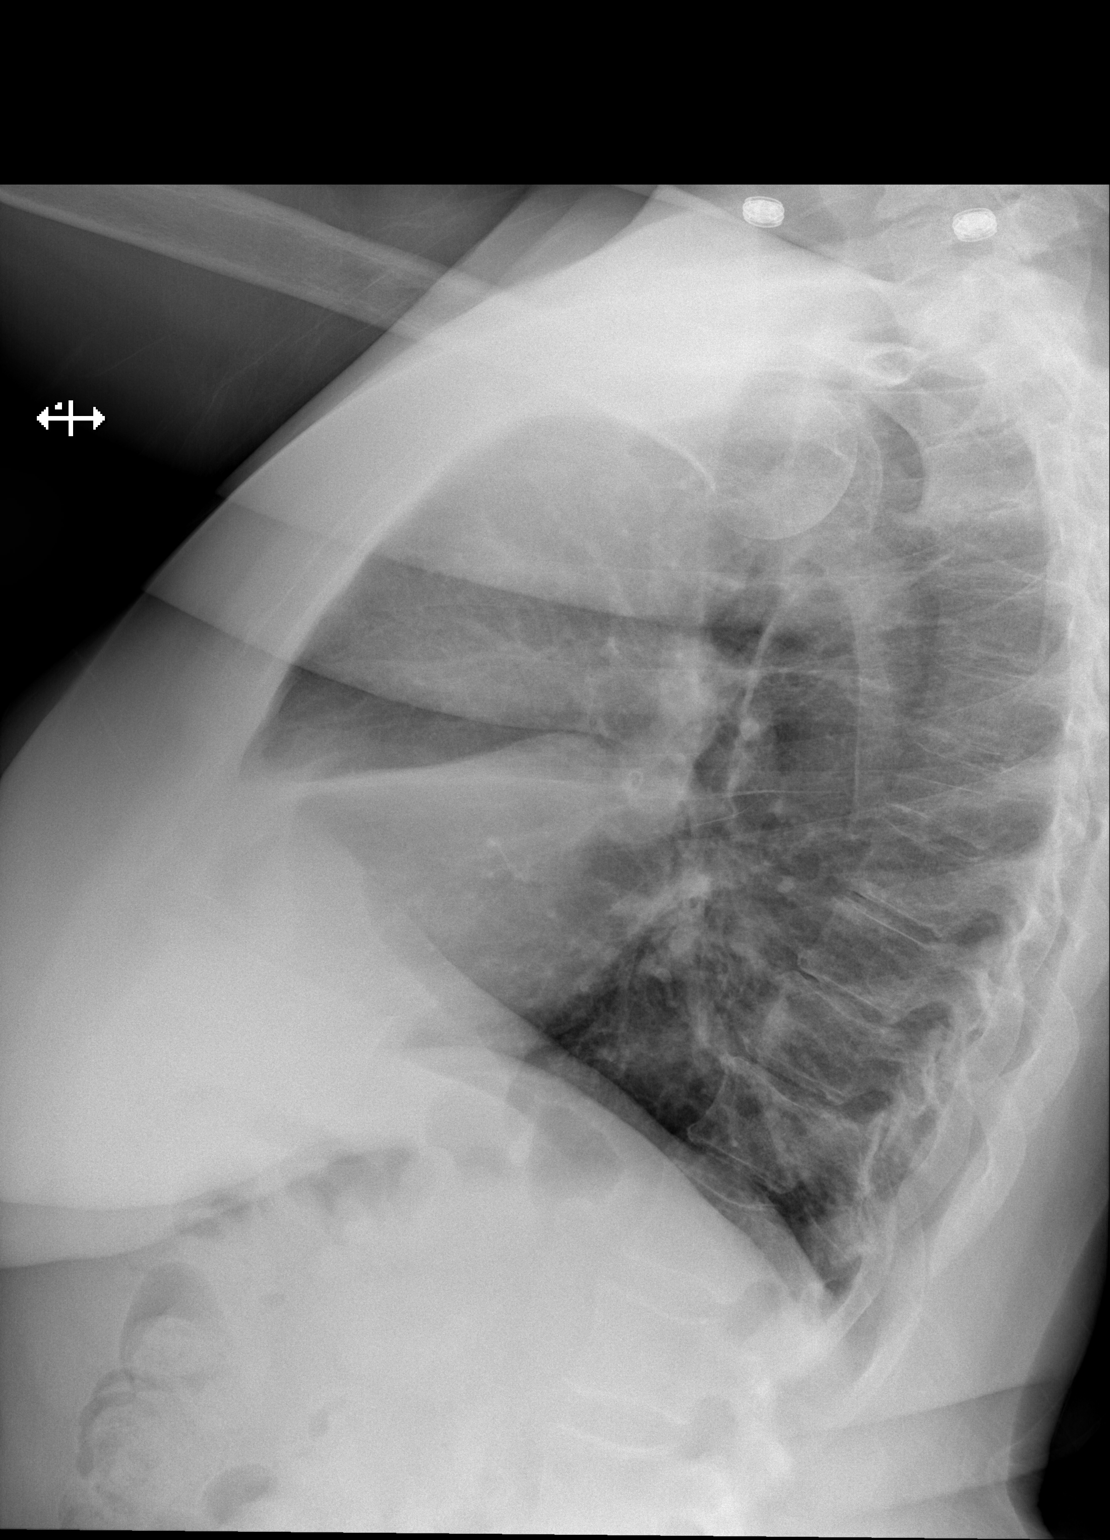

[2 of 2 positions shown; findings below may reference images not displayed]

FINDINGS: Persistent right middle lobe collapse secondary to infrahilar mass.
Left lung is clear. No pleural effusion. Stable cardiomediastinal
contours. No acute osseous abnormality.
IMPRESSION: Persistent right middle lobe collapse secondary to infrahilar mass.

## 2019-06-04 SURGERY — BRONCHOSCOPY, WITH EBUS
Anesthesia: General | Site: Chest

## 2019-06-04 MED ORDER — GLYCOPYRROLATE 0.2 MG/ML IJ SOLN
INTRAMUSCULAR | Status: DC | PRN
  Administered 2019-06-04: .1 mg via INTRAVENOUS

## 2019-06-04 MED ORDER — PHENYLEPHRINE HCL-NACL 10-0.9 MG/250ML-% IV SOLN
INTRAVENOUS | Status: DC | PRN
  Administered 2019-06-04: 35 ug/min via INTRAVENOUS

## 2019-06-04 MED ORDER — 0.9 % SODIUM CHLORIDE (POUR BTL) OPTIME
TOPICAL | Status: DC | PRN
  Administered 2019-06-04: 1000 mL

## 2019-06-04 MED ORDER — FENTANYL CITRATE (PF) 100 MCG/2ML IJ SOLN: 25.0000 ug | INTRAMUSCULAR | Status: DC | PRN

## 2019-06-04 MED ORDER — OXYCODONE HCL 5 MG PO TABS: 5.0000 mg | ORAL_TABLET | Freq: Once | ORAL | Status: DC | PRN

## 2019-06-04 MED ORDER — PHENYLEPHRINE 40 MCG/ML (10ML) SYRINGE FOR IV PUSH (FOR BLOOD PRESSURE SUPPORT)
PREFILLED_SYRINGE | INTRAVENOUS | Status: AC
  Filled 2019-06-04: qty 10

## 2019-06-04 MED ORDER — OXYCODONE HCL 5 MG/5ML PO SOLN: 5.0000 mg | Freq: Once | ORAL | Status: DC | PRN

## 2019-06-04 MED ORDER — ONDANSETRON HCL 4 MG/2ML IJ SOLN
4.0000 mg | Freq: Once | INTRAMUSCULAR | Status: AC | PRN
  Administered 2019-06-04: 12:00:00 4 mg via INTRAVENOUS

## 2019-06-04 MED ORDER — SUGAMMADEX SODIUM 200 MG/2ML IV SOLN
INTRAVENOUS | Status: DC | PRN
  Administered 2019-06-04: 200 mg via INTRAVENOUS

## 2019-06-04 MED ORDER — PROPOFOL 10 MG/ML IV BOLUS
INTRAVENOUS | Status: DC | PRN
  Administered 2019-06-04: 160 mg via INTRAVENOUS

## 2019-06-04 MED ORDER — MIDAZOLAM HCL 5 MG/5ML IJ SOLN
INTRAMUSCULAR | Status: DC | PRN
  Administered 2019-06-04: 2 mg via INTRAVENOUS

## 2019-06-04 MED ORDER — LACTATED RINGERS IV SOLN: INTRAVENOUS | Status: DC

## 2019-06-04 MED ORDER — LIDOCAINE 2% (20 MG/ML) 5 ML SYRINGE
INTRAMUSCULAR | Status: DC | PRN
  Administered 2019-06-04: 60 mg via INTRAVENOUS

## 2019-06-04 MED ORDER — FENTANYL CITRATE (PF) 250 MCG/5ML IJ SOLN
INTRAMUSCULAR | Status: AC
  Filled 2019-06-04: qty 5

## 2019-06-04 MED ORDER — ONDANSETRON HCL 4 MG/2ML IJ SOLN
INTRAMUSCULAR | Status: AC
  Filled 2019-06-04: qty 2

## 2019-06-04 MED ORDER — PROPOFOL 10 MG/ML IV BOLUS
INTRAVENOUS | Status: AC
  Filled 2019-06-04: qty 20

## 2019-06-04 MED ORDER — FENTANYL CITRATE (PF) 100 MCG/2ML IJ SOLN
INTRAMUSCULAR | Status: DC | PRN
  Administered 2019-06-04 (×2): 25 ug via INTRAVENOUS
  Administered 2019-06-04: 100 ug via INTRAVENOUS

## 2019-06-04 MED ORDER — MIDAZOLAM HCL 2 MG/2ML IJ SOLN
INTRAMUSCULAR | Status: AC
  Filled 2019-06-04: qty 2

## 2019-06-04 MED ORDER — DEXAMETHASONE SODIUM PHOSPHATE 10 MG/ML IJ SOLN
INTRAMUSCULAR | Status: DC | PRN
  Administered 2019-06-04: 10 mg via INTRAVENOUS

## 2019-06-04 MED ORDER — ROCURONIUM BROMIDE 50 MG/5ML IV SOSY
PREFILLED_SYRINGE | INTRAVENOUS | Status: DC | PRN
  Administered 2019-06-04: 30 mg via INTRAVENOUS
  Administered 2019-06-04 (×2): 10 mg via INTRAVENOUS

## 2019-06-04 MED ORDER — PROCHLORPERAZINE MALEATE 10 MG PO TABS: 10.0000 mg | ORAL_TABLET | Freq: Four times a day (QID) | ORAL | 0 refills | Status: DC | PRN

## 2019-06-04 MED ORDER — ONDANSETRON HCL 4 MG/2ML IJ SOLN
INTRAMUSCULAR | Status: DC | PRN
  Administered 2019-06-04: 4 mg via INTRAVENOUS

## 2019-06-04 SURGICAL SUPPLY — 40 items
ADAPTER VALVE BIOPSY EBUS (MISCELLANEOUS) IMPLANT
ADPTR VALVE BIOPSY EBUS (MISCELLANEOUS)
BALLN FOR EBUS SCOPE (BALLOONS) ×3
BALLOON FOR EBUS SCOPE (BALLOONS) ×1 IMPLANT
BRUSH CYTOL CELLEBRITY 1.5X140 (MISCELLANEOUS) IMPLANT
BRUSH CYTOLOGY (MISCELLANEOUS) ×3 IMPLANT
CANISTER SUCT 3000ML PPV (MISCELLANEOUS) ×3 IMPLANT
CONT SPEC 4OZ CLIKSEAL STRL BL (MISCELLANEOUS) ×3 IMPLANT
COVER BACK TABLE 60X90IN (DRAPES) ×3 IMPLANT
FORCEPS BIOP RJ4 1.8 (CUTTING FORCEPS) IMPLANT
FORCEPS RADIAL JAW LRG 4 PULM (INSTRUMENTS) ×1 IMPLANT
GAUZE SPONGE 4X4 12PLY STRL (GAUZE/BANDAGES/DRESSINGS) ×3 IMPLANT
GLOVE BIO SURGEON STRL SZ 6.5 (GLOVE) ×4 IMPLANT
GLOVE BIO SURGEONS STRL SZ 6.5 (GLOVE) ×2
GLOVE BIOGEL PI IND STRL 6.5 (GLOVE) ×1 IMPLANT
GLOVE BIOGEL PI INDICATOR 6.5 (GLOVE) ×2
GOWN STRL REUS W/ TWL LRG LVL3 (GOWN DISPOSABLE) ×1 IMPLANT
GOWN STRL REUS W/TWL LRG LVL3 (GOWN DISPOSABLE) ×2
KIT CLEAN ENDO COMPLIANCE (KITS) ×6 IMPLANT
KIT TURNOVER KIT B (KITS) ×3 IMPLANT
MARKER SKIN DUAL TIP RULER LAB (MISCELLANEOUS) ×3 IMPLANT
NEEDLE ASPIRATION VIZISHOT 19G (NEEDLE) IMPLANT
NEEDLE ASPIRATION VIZISHOT 21G (NEEDLE) ×3 IMPLANT
NS IRRIG 1000ML POUR BTL (IV SOLUTION) ×3 IMPLANT
OIL SILICONE PENTAX (PARTS (SERVICE/REPAIRS)) ×3 IMPLANT
PAD ARMBOARD 7.5X6 YLW CONV (MISCELLANEOUS) ×6 IMPLANT
RADIAL JAW LRG 4 PULMONARY (INSTRUMENTS) ×2
SYR 20ML ECCENTRIC (SYRINGE) ×6 IMPLANT
SYR 20ML LL LF (SYRINGE) ×6 IMPLANT
SYR 50ML SLIP (SYRINGE) ×3 IMPLANT
SYR 5ML LUER SLIP (SYRINGE) ×3 IMPLANT
TOWEL GREEN STERILE FF (TOWEL DISPOSABLE) ×3 IMPLANT
TRAP SPECIMEN MUCOUS 40CC (MISCELLANEOUS) IMPLANT
TUBE CONNECTING 20'X1/4 (TUBING) ×2
TUBE CONNECTING 20X1/4 (TUBING) ×4 IMPLANT
UNDERPAD 30X30 (UNDERPADS AND DIAPERS) ×3 IMPLANT
VALVE BIOPSY  SINGLE USE (MISCELLANEOUS) ×2
VALVE BIOPSY SINGLE USE (MISCELLANEOUS) ×1 IMPLANT
VALVE SUCTION BRONCHIO DISP (MISCELLANEOUS) ×3 IMPLANT
WATER STERILE IRR 1000ML POUR (IV SOLUTION) ×3 IMPLANT

## 2019-06-04 NOTE — Anesthesia Procedure Notes (Signed)
Procedure Name: Intubation Date/Time: 06/04/2019 9:15 AM Performed by: Lavell Luster, CRNA Pre-anesthesia Checklist: Patient identified, Emergency Drugs available, Suction available, Patient being monitored and Timeout performed Patient Re-evaluated:Patient Re-evaluated prior to induction Oxygen Delivery Method: Circle system utilized Preoxygenation: Pre-oxygenation with 100% oxygen Induction Type: IV induction Ventilation: Mask ventilation without difficulty Laryngoscope Size: Mac and 3 Grade View: Grade I Tube type: Oral Tube size: 8.0 mm Number of attempts: 1 Airway Equipment and Method: Stylet Placement Confirmation: ETT inserted through vocal cords under direct vision,  positive ETCO2 and breath sounds checked- equal and bilateral Secured at: 21 cm Tube secured with: Tape Dental Injury: Teeth and Oropharynx as per pre-operative assessment

## 2019-06-04 NOTE — Interval H&P Note (Signed)
History and Physical Interval Note:  06/04/2019 9:08 AM  Janice Adams  has presented today for surgery, with the diagnosis of RIGHT LUNG MASS.  The various methods of treatment have been discussed with the patient and family. After consideration of risks, benefits and other options for treatment, the patient has consented to  Procedure(s): [place] (N/A) as a surgical intervention.  The patient's history has been reviewed, patient examined, no change in status, stable for surgery.  I have reviewed the patient's chart and labs.  Questions were answered to the patient's satisfaction.     Shandora Koogler Rodman Pickle

## 2019-06-04 NOTE — Op Note (Signed)
Video Bronchoscopy with Endobronchial Ultrasound Procedure Note   Date of Operation: 06/04/2019  Pre-op Diagnosis: Right infrahilar mass  Post-op Diagnosis: Right infrahilar mas  Surgeon: Zonnique Norkus Rodman Pickle, MD  Assistants: None  Anesthesia: General endotracheal anesthesia  Operation: Flexible video fiberoptic bronchoscopy with endobronchial ultrasound and biopsies.  Estimated Blood Loss: less than 50   Complications: No immediate post-procedure complications  Indications and History: Narcissus Detwiler is a 64 y.o. female with right infrahilar mass.  The risks, benefits, complications, treatment options and expected outcomes were discussed with the patient.  The possibilities of pneumothorax, pneumonia, reaction to medication, pulmonary aspiration, perforation of a viscus, bleeding, failure to diagnose a condition and creating a complication requiring transfusion or operation were discussed with the patient who freely signed the consent.    Description of Procedure: The patient was examined in the preoperative area and history and data from the preprocedure consultation were reviewed. It was deemed appropriate to proceed.  The patient was taken to OR, identified as [name] and the procedure verified as Flexible Video Fiberoptic Bronchoscopy.  A Time Out was held and the above information confirmed. After being taken to the operating room general anesthesia was initiated and the patient  was orally intubated.   The video fiberoptic bronchoscope was introduced via the endotracheal tube and a general inspection was performed. RUL, RLL, LUL, LLL and their subsegments were visualized. RML with narrowed entryway that precluded visualization of subsegments of that branch. BAL was performed on RML with 120cc instilled and ~15cc fluid collected and sent for micro and cytology. Tissue sample was obtained as noted below for cytology.  The standard scope was then withdrawn and the endobronchial  ultrasound was used to identify and characterize the peritracheal, hilar and bronchial lymph nodes. Transbronchial needle sampling was performed as noted below.   Samples: 1. TBNA x 3 from 11L node measured 8m  2. TBNA x 3 from 4L node measured 784m 3. TBNA x 3 from station 7 node measured 1449m4. TBNA x 3 from 4R node measured 5mm66m TBNA x 10 from infrahilar mass measured >20mm14mBronchial brushings x 3 from RML 7. Forcep biopsies x 3 obtained from RML   The patient tolerated the procedure well without apparent complications. There was no significant blood loss. The bronchoscope was withdrawn. Anesthesia was reversed and the patient was taken to the PACU for recovery.  Plans:  The patient will be discharged from the PACU to home when recovered from anesthesia. We will review the cytology, pathology and microbiology results with the patient when they become available. Outpatient followup will be with Oncology (after PET scan) and Pulmonology (will arrange in one month).   Jane Rodman Pickle. [facility]/Critical Care Medicine

## 2019-06-04 NOTE — Anesthesia Postprocedure Evaluation (Signed)
Anesthesia Post Note  Patient: Janice Adams  Procedure(s) Performed: VIDEO BRONCHOSCOPY WITH ENDOBRONCHIAL ULTRASOUND (N/A Chest)     Patient location during evaluation: PACU Anesthesia Type: General Level of consciousness: awake and alert Pain management: pain level controlled Vital Signs Assessment: post-procedure vital signs reviewed and stable Respiratory status: spontaneous breathing, nonlabored ventilation and respiratory function stable Cardiovascular status: blood pressure returned to baseline and stable Postop Assessment: no apparent nausea or vomiting Anesthetic complications: no    Last Vitals:  Vitals:   06/04/19 1146 06/04/19 1201  BP: 106/69 115/71  Pulse: (!) 108 (!) 110  Resp: (!) 24 20  Temp: 36.9 C 36.8 C  SpO2: 94% 96%    Last Pain:  Vitals:   06/04/19 1201  TempSrc:   PainSc: 0-No pain                 Audry Pili

## 2019-06-04 NOTE — Progress Notes (Signed)
[facility] [phone]   [phone]  OFFICE PROGRESS NOTE  Antony Blackbird, MD [place] [address]  DIAGNOSIS: Highly suspicious lung cancer presented with right hilar mass with occlusion of the right middle lobe bronchus centrally with right middle lobe collapse.  There are some other small pulmonary nodules.  PRIOR THERAPY:None  CURRENT THERAPY: None.  INTERVAL HISTORY: Janice Adams 64 y.o. female returns to the clinic today for follow-up visit accompanied by her daughter Janice Adams.  The patient is feeling fine today with no concerning complaints except for fatigue.  She underwent bronchoscopy with endobronchial ultrasound and biopsy of the right middle lobe lung mass earlier today.  The final pathology is still pending.  The patient has mild nausea but no vomiting, diarrhea or constipation.  She denied having any fever or chills.  She has no current chest pain but has shortness of breath with exertion with mild cough and no hemoptysis.  She was supposed to have a PET scan before this visit but unfortunately it is a scheduled to be done on June 11, 2019.  MEDICAL HISTORY: Past Medical History:  Diagnosis Date  . Back pain   . GERD (gastroesophageal reflux disease)   . Gout    no current problems per patient 05/31/19  . History of epigastric pain    comes and goes  . Hypertension    no meds    ALLERGIES:  has No Known Allergies.  MEDICATIONS:  Current Outpatient Medications  Medication Sig Dispense Refill  . naproxen sodium (ALEVE) 220 MG tablet Take 440 mg by mouth 2 (two) times daily as needed (back pain.).    Marland Kitchen omeprazole (PRILOSEC) 20 MG capsule Take 1 capsule (20 mg total) by mouth 2 (two) times daily before a meal. To lower stomach acid 60 capsule 5  . varenicline (CHANTIX PAK) 0.5 MG X 11 & 1 MG X 42 tablet Take one 0.5 mg tablet by mouth once daily for 3 days, then increase to one 0.5 mg tablet twice daily  for 4 days, then increase to one 1 mg tablet twice daily. 53 tablet 0   No current facility-administered medications for this visit.   Facility-Administered Medications Ordered in Other Visits  Medication Dose Route Frequency Provider Last Rate Last Admin  . fentaNYL (SUBLIMAZE) injection 25-50 mcg  25-50 mcg Intravenous Q5 min PRN Audry Pili, MD      . lactated ringers infusion   Intravenous Continuous Audry Pili, MD 10 mL/hr at 06/04/19 0820 New Bag at 06/04/19 1030  . ondansetron (ZOFRAN) 4 MG/2ML injection           . oxyCODONE (Oxy IR/ROXICODONE) immediate release tablet 5 mg  5 mg Oral Once PRN Audry Pili, MD       Or  . oxyCODONE (ROXICODONE) 5 MG/5ML solution 5 mg  5 mg Oral Once PRN Audry Pili, MD        SURGICAL HISTORY:  Past Surgical History:  Procedure Laterality Date  . MULTIPLE TOOTH EXTRACTIONS    . TONSILLECTOMY    . uterine ablation    . WISDOM TOOTH EXTRACTION      REVIEW OF SYSTEMS:  A comprehensive review of systems was negative except for: Constitutional: positive for fatigue Respiratory: positive for dyspnea on exertion Gastrointestinal: positive for nausea   PHYSICAL EXAMINATION: General appearance: alert, cooperative, fatigued and no distress Head: Normocephalic, without obvious abnormality, atraumatic Neck: no adenopathy, no JVD,  supple, symmetrical, trachea midline and thyroid not enlarged, symmetric, no tenderness/mass/nodules Lymph nodes: Cervical, supraclavicular, and axillary nodes normal. Resp: clear to auscultation bilaterally Back: symmetric, no curvature. ROM normal. No CVA tenderness. Cardio: regular rate and rhythm, S1, S2 normal, no murmur, click, rub or gallop GI: soft, non-tender; bowel sounds normal; no masses,  no organomegaly Extremities: extremities normal, atraumatic, no cyanosis or edema  ECOG PERFORMANCE STATUS: 1 - Symptomatic but completely ambulatory  Blood pressure (!) 143/82, pulse 75, temperature 98 F  (36.7 C), temperature source Oral, resp. rate 20, height 5\' 4"  (1.626 m), weight 193 lb 11.2 oz (87.9 kg), SpO2 98 %.  LABORATORY DATA: Lab Results  Component Value Date   WBC 4.9 05/21/2019   HGB 14.4 05/21/2019   HCT 42.9 05/21/2019   MCV 98.4 05/21/2019   PLT 344 05/21/2019      Chemistry      Component Value Date/Time   NA 142 05/21/2019 1338   NA 146 (H) 03/06/2018 1128   K 4.3 05/21/2019 1338   CL 104 05/21/2019 1338   CO2 25 05/21/2019 1338   BUN 9 05/21/2019 1338   BUN 8 03/06/2018 1128   CREATININE 1.00 05/21/2019 1338   CREATININE 0.85 05/11/2016 1157      Component Value Date/Time   CALCIUM 9.9 05/21/2019 1338   ALKPHOS 116 05/21/2019 1338   AST 16 05/21/2019 1338   ALT 11 05/21/2019 1338   BILITOT 0.4 05/21/2019 1338       RADIOGRAPHIC STUDIES: DG Chest 2 View  Result Date: 06/04/2019 CLINICAL DATA:  Pre lung biopsy EXAM: CHEST - 2 VIEW COMPARISON:  04/01/2019 FINDINGS: Persistent right middle lobe collapse secondary to infrahilar mass. Left lung is clear. No pleural effusion. Stable cardiomediastinal contours. No acute osseous abnormality. IMPRESSION: Persistent right middle lobe collapse secondary to infrahilar mass. Electronically Signed   By: Macy Mis M.D.   On: 06/04/2019 08:16    ASSESSMENT AND PLAN: This is a very pleasant 64 years old African-American female with highly suspicious lung cancer presented with right hilar mass with occlusion of the right middle lobe bronchus as well as right middle lobe collapse in addition to right lower lobe pulmonary nodule. The patient had bronchoscopy with endobronchial ultrasound and biopsy performed earlier today.  The pathology is still pending. She was also supposed to have a PET scan before this visit but unfortunately because of insurance issues it was delayed until June 11, 2019. I recommended for the patient to come back for follow-up visit in around 10 days from now for reevaluation and more  detailed discussion of her treatment options based on the final staging work-up and the pathology. For the nausea I will call her pharmacy with prescription for Compazine 10 mg p.o. every 6 hours as needed for nausea. The patient was advised to call immediately if she has any other concerning symptoms in the interval. The patient voices understanding of current disease status and treatment options and is in agreement with the current care plan.  All questions were answered. The patient knows to call the clinic with any problems, questions or concerns. We can certainly see the patient much sooner if necessary.  Disclaimer: This note was dictated with voice recognition software. Similar sounding words can inadvertently be transcribed and may not be corrected upon review.

## 2019-06-04 NOTE — Transfer of Care (Signed)
Immediate Anesthesia Transfer of Care Note  Patient: Janice Adams  Procedure(s) Performed: VIDEO BRONCHOSCOPY WITH ENDOBRONCHIAL ULTRASOUND (N/A Chest)  Patient Location: PACU  Anesthesia Type:General  Level of Consciousness: awake, alert  and oriented  Airway & Oxygen Therapy: Patient connected to face mask oxygen  Post-op Assessment: Post -op Vital signs reviewed and stable  Post vital signs: stable  Last Vitals:  Vitals Value Taken Time  BP 106/69 06/04/19 1146  Temp    Pulse 97 06/04/19 1148  Resp 40 06/04/19 1148  SpO2 90 % 06/04/19 1148  Vitals shown include unvalidated device data.  Last Pain:  Vitals:   06/04/19 0826  TempSrc:   PainSc: 0-No pain      Patients Stated Pain Goal: 3 (48/25/00 3704)  Complications: No apparent anesthesia complications

## 2019-06-05 ENCOUNTER — Telehealth: Payer: Self-pay | Admitting: Internal Medicine

## 2019-06-05 ENCOUNTER — Telehealth: Payer: Self-pay | Admitting: *Deleted

## 2019-06-05 ENCOUNTER — Other Ambulatory Visit: Payer: Self-pay

## 2019-06-05 LAB — ACID FAST SMEAR (AFB, MYCOBACTERIA): Acid Fast Smear: NEGATIVE

## 2019-06-05 NOTE — Telephone Encounter (Signed)
Scheduled appt per 1/25 sch message - unable to reach pt . Unable to leave message - mailed letter with appt date and time

## 2019-06-05 NOTE — Telephone Encounter (Signed)
Oncology Nurse Navigator Documentation  Oncology Nurse Navigator Flowsheets 06/05/2019  Navigator Location [facility]  Navigator Encounter Type Telephone/I called patient's phone number listed.  I was unable to reach or leave vm message.   Telephone Outgoing Call  Treatment Phase Abnormal Scans  Barriers/Navigation Needs Education  Education Other  Interventions -  Acuity Level 2-Minimal Needs (1-2 Barriers Identified)  Coordination of Care -  Education Method -  Time Spent with Patient 15

## 2019-06-06 LAB — CULTURE, RESPIRATORY W GRAM STAIN
Culture: NORMAL
Gram Stain: NONE SEEN

## 2019-06-07 ENCOUNTER — Telehealth: Payer: Self-pay | Admitting: Pulmonary Disease

## 2019-06-07 LAB — FUNGUS STAIN

## 2019-06-07 LAB — SURGICAL PATHOLOGY

## 2019-06-07 LAB — CYTOLOGY - NON PAP

## 2019-06-07 LAB — FUNGAL STAIN REFLEX

## 2019-06-07 NOTE — Telephone Encounter (Signed)
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 Patient was not reachable due to power outage so I contacted her daughter and provided her with the appropriate information and future appointment dates for PET scan and Oncology appointments.

## 2019-06-08 LAB — ANAEROBIC CULTURE

## 2019-06-10 ENCOUNTER — Encounter: Payer: Self-pay | Admitting: *Deleted

## 2019-06-10 ENCOUNTER — Telehealth: Payer: Self-pay | Admitting: *Deleted

## 2019-06-10 NOTE — Telephone Encounter (Signed)
-----   Message from [place], MD sent at 06/04/2019  4:40 PM EST ----- Regarding: F/u post bronch, emphysema Please schedule follow-up with me in 1 month -JE

## 2019-06-10 NOTE — Progress Notes (Signed)
Oncology Nurse Navigator Documentation  Oncology Nurse Navigator Flowsheets 06/10/2019  Navigator Location [facility]  Navigator Encounter Type Other/per Dr. Julien Nordmann, I requested [employer] and PDL 1 on case number [id number] with path dept.  Telephone -  Treatment Phase Pre-Tx/Tx Discussion  Barriers/Navigation Needs Coordination of Care  Education -  Interventions Coordination of Care  Acuity Level 2-Minimal Needs (1-2 Barriers Identified)  Coordination of Care Other  Education Method -  Time Spent with Patient 30

## 2019-06-11 ENCOUNTER — Ambulatory Visit (HOSPITAL_COMMUNITY)
Admission: RE | Admit: 2019-06-11 | Discharge: 2019-06-11 | Disposition: A | Discharge status: Home / Self Care | Payer: Medicaid Other | Source: Ambulatory Visit | Attending: Internal Medicine | Admitting: Internal Medicine

## 2019-06-11 ENCOUNTER — Other Ambulatory Visit: Payer: Self-pay

## 2019-06-11 DIAGNOSIS — R911 Solitary pulmonary nodule: Secondary | ICD-10-CM

## 2019-06-11 LAB — GLUCOSE, CAPILLARY: Glucose-Capillary: 106 mg/dL — ABNORMAL HIGH (ref 70–99)

## 2019-06-11 IMAGING — PT NM PET TUM IMG INITIAL (PI) SKULL BASE T - THIGH
1 of 7 series · 1 of 25 positions shown · non-contrast
Comparison: Chest CT on [DATE]

CLINICAL DATA: Initial treatment strategy for right lung mass.

EXAM:
NUCLEAR MEDICINE PET SKULL BASE TO THIGH
TECHNIQUE: 9.6 mCi F-18 FDG was injected intravenously. Full-ring PET imaging
was performed from the skull base to thigh after the radiotracer. CT
data was obtained and used for attenuation correction and anatomic
localization.
Fasting blood glucose: 106 mg/dl

[Series 4: ct sk_thigh 5.0 hd_fov · axial · 5.0mm · 1.07mm/px · 1 of 200 slices shown]
[im 200/200  brain]
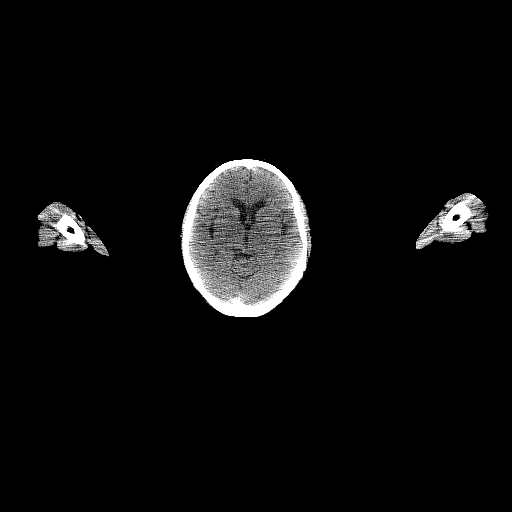

[1 of 25 positions shown; findings below may reference images not displayed]

FINDINGS: Mediastinal blood-pool activity (background): SUV max =

Liver activity (reference): SUV max = N/A

NECK: Significant image degradation due to patient motion. No
definite hypermetabolic masses identified within the neck.

Incidental CT findings:  None.

CHEST: Hypermetabolic mass is seen in the right hilar region.
Adjacent right middle lobe collapse is seen showing intense
hypermetabolic activity, and therefore the centrally obstructing
mass cannot be measured. SUV max obtained in the central right
middle lobe/hilar region measures 15.2.

Mild hypermetabolic activity is seen within sub-cm right
paratracheal lymph node, with SUV max measuring 3.2. No other
suspicious pulmonary nodules identified on CT images. Mild
centrilobular emphysema noted. No evidence of pleural effusion.

Incidental CT findings:  None.

ABDOMEN/PELVIS: No abnormal hypermetabolic activity within the
liver, pancreas, adrenal glands, or spleen. No hypermetabolic lymph
nodes in the abdomen or pelvis.

Incidental CT findings: Small calcified uterine fibroids noted. Tiny
hiatal hernia. Aortic atherosclerosis also noted.

SKELETON: No focal hypermetabolic bone lesions to suggest skeletal
metastasis.

Incidental CT findings:  None.
IMPRESSION: Hypermetabolic right hilar mass with postobstructive collapse of
right middle lobe, consistent with primary bronchogenic carcinoma.

Sub-cm hypermetabolic right paratracheal lymph node, suspicious for
lymph node metastases.

No evidence of distant metastatic disease.

## 2019-06-11 MED ORDER — FLUDEOXYGLUCOSE F - 18 (FDG) INJECTION
9.6300 | Freq: Once | INTRAVENOUS | Status: AC | PRN
  Administered 2019-06-11: 9.63 via INTRAVENOUS

## 2019-06-11 NOTE — Telephone Encounter (Signed)
atc patient unable to reach , unable to leave vm

## 2019-06-12 ENCOUNTER — Inpatient Hospital Stay: Payer: Medicaid Other | Attending: Internal Medicine | Admitting: Internal Medicine

## 2019-06-12 ENCOUNTER — Inpatient Hospital Stay: Payer: Medicaid Other

## 2019-06-12 ENCOUNTER — Other Ambulatory Visit: Payer: Self-pay

## 2019-06-12 ENCOUNTER — Encounter: Payer: Self-pay | Admitting: Internal Medicine

## 2019-06-12 VITALS — BP 154/83 | HR 105 | Temp 99.4°F | Resp 20 | Ht 64.0 in | Wt 186.9 lb

## 2019-06-12 DIAGNOSIS — J9819 Other pulmonary collapse: Secondary | ICD-10-CM | POA: Diagnosis not present

## 2019-06-12 DIAGNOSIS — F17200 Nicotine dependence, unspecified, uncomplicated: Secondary | ICD-10-CM

## 2019-06-12 DIAGNOSIS — R918 Other nonspecific abnormal finding of lung field: Secondary | ICD-10-CM

## 2019-06-12 DIAGNOSIS — C3491 Malignant neoplasm of unspecified part of right bronchus or lung: Secondary | ICD-10-CM | POA: Diagnosis not present

## 2019-06-12 DIAGNOSIS — I700 Atherosclerosis of aorta: Secondary | ICD-10-CM | POA: Insufficient documentation

## 2019-06-12 DIAGNOSIS — C342 Malignant neoplasm of middle lobe, bronchus or lung: Secondary | ICD-10-CM | POA: Diagnosis present

## 2019-06-12 DIAGNOSIS — C3490 Malignant neoplasm of unspecified part of unspecified bronchus or lung: Secondary | ICD-10-CM | POA: Diagnosis not present

## 2019-06-12 DIAGNOSIS — I10 Essential (primary) hypertension: Secondary | ICD-10-CM | POA: Insufficient documentation

## 2019-06-12 LAB — CBC WITH DIFFERENTIAL (CANCER CENTER ONLY)
Abs Immature Granulocytes: 0.02 10*3/uL (ref 0.00–0.07)
Basophils Absolute: 0 10*3/uL (ref 0.0–0.1)
Basophils Relative: 1 %
Eosinophils Absolute: 0 10*3/uL (ref 0.0–0.5)
Eosinophils Relative: 1 %
HCT: 36.7 % (ref 36.0–46.0)
Hemoglobin: 12.5 g/dL (ref 12.0–15.0)
Immature Granulocytes: 0 %
Lymphocytes Relative: 16 %
Lymphs Abs: 1.4 10*3/uL (ref 0.7–4.0)
MCH: 32.3 pg (ref 26.0–34.0)
MCHC: 34.1 g/dL (ref 30.0–36.0)
MCV: 94.8 fL (ref 80.0–100.0)
Monocytes Absolute: 1 10*3/uL (ref 0.1–1.0)
Monocytes Relative: 11 %
Neutro Abs: 6.3 10*3/uL (ref 1.7–7.7)
Neutrophils Relative %: 71 %
Platelet Count: 413 10*3/uL — ABNORMAL HIGH (ref 150–400)
RBC: 3.87 MIL/uL (ref 3.87–5.11)
RDW: 12.5 % (ref 11.5–15.5)
WBC Count: 8.8 10*3/uL (ref 4.0–10.5)
nRBC: 0 % (ref 0.0–0.2)

## 2019-06-12 LAB — CMP (CANCER CENTER ONLY)
ALT: 8 U/L (ref 0–44)
AST: 9 U/L — ABNORMAL LOW (ref 15–41)
Albumin: 3.5 g/dL (ref 3.5–5.0)
Alkaline Phosphatase: 98 U/L (ref 38–126)
Anion gap: 12 (ref 5–15)
BUN: 11 mg/dL (ref 8–23)
CO2: 27 mmol/L (ref 22–32)
Calcium: 9.7 mg/dL (ref 8.9–10.3)
Chloride: 100 mmol/L (ref 98–111)
Creatinine: 1.09 mg/dL — ABNORMAL HIGH (ref 0.44–1.00)
GFR, Est AFR Am: >60 mL/min (ref >60)
GFR, Estimated: 54 mL/min — ABNORMAL LOW (ref >60)
Glucose, Bld: 104 mg/dL — ABNORMAL HIGH (ref 70–99)
Potassium: 3.2 mmol/L — ABNORMAL LOW (ref 3.5–5.1)
Sodium: 139 mmol/L (ref 135–145)
Total Bilirubin: 1 mg/dL (ref 0.3–1.2)
Total Protein: 8.4 g/dL — ABNORMAL HIGH (ref 6.5–8.1)

## 2019-06-12 NOTE — Progress Notes (Signed)
START ON PATHWAY REGIMEN - Non-Small Cell Lung     Administer weekly:     Paclitaxel      Carboplatin   **Always confirm dose/schedule in your pharmacy ordering system**  Patient Characteristics: Stage III - Unresectable, PS = 0, 1 AJCC T Category: T3 Current Disease Status: No Distant Mets or Local Recurrence AJCC N Category: N2 AJCC M Category: M0 AJCC 8 Stage Grouping: IIIB ECOG Performance Status: 1 Intent of Therapy: Curative Intent, Discussed with Patient 

## 2019-06-12 NOTE — Progress Notes (Signed)
[facility] [phone]   [phone]  OFFICE PROGRESS NOTE  Antony Blackbird, MD [place] [address]  DIAGNOSIS: Stage IIIb (T3, M2, M0) non-small cell lung cancer, adenocarcinoma presented with right hilar mass with occlusion of the right middle lobe bronchus centrally with right middle lobe collapse diagnosed in January 2021.   PRIOR THERAPY: None  CURRENT THERAPY: Concurrent chemoradiation with weekly carboplatin for AUC of 2 and paclitaxel 45 mg/M2.  First dose expected on June 24, 2019.  INTERVAL HISTORY: Janice Adams 64 y.o. female returns to the clinic today for follow-up visit accompanied by her daughter.  The patient is feeling fine today with no concerning complaints except for anxiety about her biopsy results.  The patient denied having any current chest pain but continues to have shortness of breath with exertion with cough and no hemoptysis.  She denied having any fever or chills.  She has no nausea, vomiting, diarrhea or constipation.  She denied having any headache or visual changes.  The patient has no recent weight loss or night sweats.  She had several studies performed recently including a PET scan that was performed yesterday.  She also had bronchoscopy with endobronchial ultrasound and biopsy under the care of Dr. Loanne Drilling and the final pathology was consistent with adenocarcinoma.  The patient is here today for evaluation and discussion of her treatment options based on the recent studies.  MEDICAL HISTORY: Past Medical History:  Diagnosis Date   Back pain    GERD (gastroesophageal reflux disease)    Gout    no current problems per patient 05/31/19   History of epigastric pain    comes and goes   Hypertension    no meds    ALLERGIES:  has No Known Allergies.  MEDICATIONS:  Current Outpatient Medications  Medication Sig Dispense Refill   naproxen sodium (ALEVE) 220 MG tablet Take 440 mg by  mouth 2 (two) times daily as needed (back pain.).     omeprazole (PRILOSEC) 20 MG capsule Take 1 capsule (20 mg total) by mouth 2 (two) times daily before a meal. To lower stomach acid 60 capsule 5   prochlorperazine (COMPAZINE) 10 MG tablet Take 1 tablet (10 mg total) by mouth every 6 (six) hours as needed for nausea or vomiting. 30 tablet 0   varenicline (CHANTIX PAK) 0.5 MG X 11 & 1 MG X 42 tablet Take one 0.5 mg tablet by mouth once daily for 3 days, then increase to one 0.5 mg tablet twice daily for 4 days, then increase to one 1 mg tablet twice daily. (Patient not taking: Reported on 06/04/2019) 53 tablet 0   No current facility-administered medications for this visit.    SURGICAL HISTORY:  Past Surgical History:  Procedure Laterality Date   MULTIPLE TOOTH EXTRACTIONS     TONSILLECTOMY     uterine ablation     VIDEO BRONCHOSCOPY WITH ENDOBRONCHIAL ULTRASOUND N/A 06/04/2019   Procedure: VIDEO BRONCHOSCOPY WITH ENDOBRONCHIAL ULTRASOUND;  Surgeon: Margaretha Seeds, MD;  Location: [facility];  Service: Thoracic;  Laterality: N/A;   WISDOM TOOTH EXTRACTION      REVIEW OF SYSTEMS:  Constitutional: positive for fatigue Eyes: negative Ears, nose, mouth, throat, and face: negative Respiratory: positive for cough and dyspnea on exertion Cardiovascular: negative Gastrointestinal: negative Genitourinary:negative Integument/breast: negative Hematologic/lymphatic: negative Musculoskeletal:negative Neurological: negative Behavioral/Psych: negative Endocrine: negative Allergic/Immunologic: negative   PHYSICAL EXAMINATION: General appearance: alert, cooperative, fatigued and no distress Head: Normocephalic,  without obvious abnormality, atraumatic Neck: no adenopathy, no JVD, supple, symmetrical, trachea midline and thyroid not enlarged, symmetric, no tenderness/mass/nodules Lymph nodes: Cervical, supraclavicular, and axillary nodes normal. Resp: clear to auscultation bilaterally Back:  symmetric, no curvature. ROM normal. No CVA tenderness. Cardio: regular rate and rhythm, S1, S2 normal, no murmur, click, rub or gallop GI: soft, non-tender; bowel sounds normal; no masses,  no organomegaly Extremities: extremities normal, atraumatic, no cyanosis or edema Neurologic: Alert and oriented X 3, normal strength and tone. Normal symmetric reflexes. Normal coordination and gait  ECOG PERFORMANCE STATUS: 1 - Symptomatic but completely ambulatory  Blood pressure (!) 154/83, pulse (!) 105, temperature 99.4 F (37.4 C), temperature source Oral, resp. rate 20, height 5\' 4"  (1.626 m), weight 186 lb 14.4 oz (84.8 kg), SpO2 100 %.  LABORATORY DATA: Lab Results  Component Value Date   WBC 8.8 06/12/2019   HGB 12.5 06/12/2019   HCT 36.7 06/12/2019   MCV 94.8 06/12/2019   PLT 413 (H) 06/12/2019      Chemistry      Component Value Date/Time   NA 139 06/12/2019 1114   NA 146 (H) 03/06/2018 1128   K 3.2 (L) 06/12/2019 1114   CL 100 06/12/2019 1114   CO2 27 06/12/2019 1114   BUN 11 06/12/2019 1114   BUN 8 03/06/2018 1128   CREATININE 1.09 (H) 06/12/2019 1114   CREATININE 0.85 05/11/2016 1157      Component Value Date/Time   CALCIUM 9.7 06/12/2019 1114   ALKPHOS 98 06/12/2019 1114   AST 9 (L) 06/12/2019 1114   ALT 8 06/12/2019 1114   BILITOT 1.0 06/12/2019 1114       RADIOGRAPHIC STUDIES: DG Chest 2 View  Result Date: 06/04/2019 CLINICAL DATA:  Pre lung biopsy EXAM: CHEST - 2 VIEW COMPARISON:  04/01/2019 FINDINGS: Persistent right middle lobe collapse secondary to infrahilar mass. Left lung is clear. No pleural effusion. Stable cardiomediastinal contours. No acute osseous abnormality. IMPRESSION: Persistent right middle lobe collapse secondary to infrahilar mass. Electronically Signed   By: Macy Mis M.D.   On: 06/04/2019 08:16   NM PET Image Initial (PI) Skull Base To Thigh  Result Date: 06/11/2019 CLINICAL DATA:  Initial treatment strategy for right lung mass. EXAM:  NUCLEAR MEDICINE PET SKULL BASE TO THIGH TECHNIQUE: 9.6 mCi F-18 FDG was injected intravenously. Full-ring PET imaging was performed from the skull base to thigh after the radiotracer. CT data was obtained and used for attenuation correction and anatomic localization. Fasting blood glucose: 106 mg/dl COMPARISON:  Chest CT on 04/26/2019 FINDINGS: Mediastinal blood-pool activity (background): SUV max = 2.8 Liver activity (reference): SUV max = N/A NECK: Significant image degradation due to patient motion. No definite hypermetabolic masses identified within the neck. Incidental CT findings:  None. CHEST: Hypermetabolic mass is seen in the right hilar region. Adjacent right middle lobe collapse is seen showing intense hypermetabolic activity, and therefore the centrally obstructing mass cannot be measured. SUV max obtained in the central right middle lobe/hilar region measures 15.2. Mild hypermetabolic activity is seen within sub-cm right paratracheal lymph node, with SUV max measuring 3.2. No other suspicious pulmonary nodules identified on CT images. Mild centrilobular emphysema noted. No evidence of pleural effusion. Incidental CT findings:  None. ABDOMEN/PELVIS: No abnormal hypermetabolic activity within the liver, pancreas, adrenal glands, or spleen. No hypermetabolic lymph nodes in the abdomen or pelvis. Incidental CT findings: Small calcified uterine fibroids noted. Tiny hiatal hernia. Aortic atherosclerosis also noted. SKELETON: No focal hypermetabolic bone lesions  to suggest skeletal metastasis. Incidental CT findings:  None. IMPRESSION: Hypermetabolic right hilar mass with postobstructive collapse of right middle lobe, consistent with primary bronchogenic carcinoma. Sub-cm hypermetabolic right paratracheal lymph node, suspicious for lymph node metastases. No evidence of distant metastatic disease. Electronically Signed   By: Marlaine Hind M.D.   On: 06/11/2019 19:25    ASSESSMENT AND PLAN: This is a very  pleasant 64 years old African-American female diagnosed with stage IIIb (T3, N2, M0) non-small cell lung cancer, adenocarcinoma presented with right hilar mass with occlusion of the right middle lobe bronchus as well as right middle lobe collapse in addition to right lower lobe pulmonary nodule diagnosed in January 2021. I had a lengthy discussion with the patient today about her current disease stage, prognosis and treatment options. I recommended for the patient to complete the staging work-up by ordering MRI of the brain to rule out brain metastasis. I also discussed with the patient her treatment options including a course of concurrent chemoradiation with weekly carboplatin for AUC of 2 and paclitaxel 45 mg/M2.  This will be followed by consolidation immunotherapy if the patient is not a surgical candidate after the induction phase of her treatment. I discussed with the patient the adverse effect of this treatment including but not limited to alopecia, myelosuppression, nausea and vomiting, peripheral neuropathy, liver or renal dysfunction. I will arrange for the patient to have a chemotherapy education class before the first dose of her treatment. I will refer the patient to radiation oncology for discussion of the radiotherapy option. The patient is expected to start the first dose of this treatment on June 24, 2019. She will come back for follow-up visit at that time. She was advised to call immediately if she has any concerning symptoms in the interval. The patient voices understanding of current disease status and treatment options and is in agreement with the current care plan.  All questions were answered. The patient knows to call the clinic with any problems, questions or concerns. We can certainly see the patient much sooner if necessary.  Disclaimer: This note was dictated with voice recognition software. Similar sounding words can inadvertently be transcribed and may not be  corrected upon review.

## 2019-06-13 ENCOUNTER — Encounter: Payer: Self-pay | Admitting: *Deleted

## 2019-06-13 ENCOUNTER — Telehealth: Payer: Self-pay | Admitting: Medical Oncology

## 2019-06-13 DIAGNOSIS — C3491 Malignant neoplasm of unspecified part of right bronchus or lung: Secondary | ICD-10-CM

## 2019-06-13 NOTE — Telephone Encounter (Signed)
Asking for medical records. transferred call to [facility] -no one in HIM.

## 2019-06-14 ENCOUNTER — Telehealth: Payer: Self-pay | Admitting: Internal Medicine

## 2019-06-14 ENCOUNTER — Telehealth: Payer: Self-pay | Admitting: *Deleted

## 2019-06-14 NOTE — Telephone Encounter (Signed)
Scheduled per los. Called and spoke with patient. Confirmed appts  

## 2019-06-14 NOTE — Telephone Encounter (Signed)
Oncology Nurse Navigator Documentation  Oncology Nurse Navigator Flowsheets 06/14/2019  Navigator Location [facility]  Navigator Encounter Type Telephone/I followed up with Dr. Julien Nordmann regarding Janice Adams and her plan of care.  Dr. Julien Nordmann would like Dr. Roxan Hockey to see patient at [facility].  I called patient to update and set her up to be seen at [facility] next week.  Patient verbalized understanding of appt time and place.   Telephone Outgoing Call  Treatment Phase Pre-Tx/Tx Discussion  Barriers/Navigation Needs Coordination of Care;Education  Education Other  Interventions Coordination of Care;Education  Acuity Level 2-Minimal Needs (1-2 Barriers Identified)  Coordination of Care Appts;Other  Education Method Verbal  Time Spent with Patient 30

## 2019-06-17 ENCOUNTER — Encounter: Payer: Self-pay | Admitting: *Deleted

## 2019-06-17 NOTE — Progress Notes (Signed)
Oncology Nurse Navigator Documentation  Oncology Nurse Navigator Flowsheets 06/17/2019  Navigator Location [facility]  Navigator Encounter Type Other/I updated Dr. Julien Nordmann on PDL 1 results and that moleculars will be completed on 06/24/19.    Telephone -  Treatment Phase Pre-Tx/Tx Discussion  Barriers/Navigation Needs Coordination of Care  Education -  Interventions Coordination of Care  Acuity Level 2-Minimal Needs (1-2 Barriers Identified)  Coordination of Care Other  Education Method -  Time Spent with Patient 30

## 2019-06-18 ENCOUNTER — Encounter (HOSPITAL_COMMUNITY): Payer: Self-pay | Admitting: Internal Medicine

## 2019-06-18 NOTE — Telephone Encounter (Signed)
ATC patient unable to reach no VM set up, x2

## 2019-06-19 ENCOUNTER — Encounter: Payer: Self-pay | Admitting: Internal Medicine

## 2019-06-19 ENCOUNTER — Inpatient Hospital Stay: Payer: Medicaid Other

## 2019-06-19 ENCOUNTER — Other Ambulatory Visit: Payer: Self-pay

## 2019-06-19 NOTE — Progress Notes (Signed)
Met with patient at registration to introduce myself as Arboriculturist and to offer available resources.  Discussed one-time $700 [place] to assist with personal expenses while going through treatment.  Gave her my card if interested in applying and for any additional financial questions or concerns.

## 2019-06-20 ENCOUNTER — Telehealth: Payer: Self-pay | Admitting: *Deleted

## 2019-06-20 ENCOUNTER — Encounter: Payer: Self-pay | Admitting: *Deleted

## 2019-06-20 ENCOUNTER — Other Ambulatory Visit: Payer: Self-pay | Admitting: *Deleted

## 2019-06-20 ENCOUNTER — Other Ambulatory Visit: Payer: Self-pay

## 2019-06-20 ENCOUNTER — Ambulatory Visit
Admission: RE | Admit: 2019-06-20 | Discharge: 2019-06-20 | Disposition: A | Discharge status: Home / Self Care | Payer: Medicaid Other | Source: Ambulatory Visit | Attending: Radiation Oncology | Admitting: Radiation Oncology

## 2019-06-20 ENCOUNTER — Encounter: Payer: Medicaid Other | Admitting: Thoracic Surgery (Cardiothoracic Vascular Surgery)

## 2019-06-20 VITALS — BP 156/98 | HR 95 | Temp 99.2°F | Resp 20 | Wt 189.5 lb

## 2019-06-20 DIAGNOSIS — C3491 Malignant neoplasm of unspecified part of right bronchus or lung: Secondary | ICD-10-CM

## 2019-06-20 NOTE — Telephone Encounter (Signed)
Oncology Nurse Navigator Documentation  Oncology Nurse Navigator Flowsheets 06/20/2019  Abnormal Finding Date 04/26/2019  Confirmed Diagnosis Date 06/04/2019  Navigator Location [facility]  Navigator Encounter Type Telephone/I called patient to update her on her added appt for [facility].  She verbalized understanding   Telephone Outgoing Call  Treatment Phase Pre-Tx/Tx Discussion  Barriers/Navigation Needs Education  Education -  Interventions Education  Acuity Level 2-Minimal Needs (1-2 Barriers Identified)  Coordination of Care -  Education Method Verbal  Time Spent with Patient 15

## 2019-06-20 NOTE — Progress Notes (Signed)
The proposed treatment discussed in cancer conference 06/20/19 is for discussion purpose only and is not a binding recommendation.  The patient was not physically examined nor present for their treatment options.  Therefore, final treatment plans cannot be decided.  

## 2019-06-21 ENCOUNTER — Ambulatory Visit: Payer: Medicaid Other | Admitting: Radiation Oncology

## 2019-06-24 ENCOUNTER — Ambulatory Visit (HOSPITAL_COMMUNITY)
Admission: RE | Admit: 2019-06-24 | Discharge: 2019-06-24 | Disposition: A | Discharge status: Home / Self Care | Payer: Medicaid Other | Source: Ambulatory Visit | Attending: Internal Medicine | Admitting: Internal Medicine

## 2019-06-24 NOTE — Progress Notes (Signed)
Radiation Oncology         [phone]) [phone] ________________________________  Name: Janice Adams        MRN: [id number]  Date of Service: 06/20/2019 DOB: [birth year]  TG:GYIR, Ander Gaster, MD  Antony Blackbird, MD     REFERRING PHYSICIAN: Antony Blackbird, MD   DIAGNOSIS: The encounter diagnosis was Adenocarcinoma of right lung, stage 3 ([place]).   HISTORY OF PRESENT ILLNESS: Janice Adams is a 64 y.o. female seen at the request of Dr. Julien Nordmann for a recent history of a mass in the right hilar and RML region who originally presented with shortness of breath in November 2020. Her work up revealed a mass in the right infrahilar region measuring 5.3 x 3.6 x 3.2 cm which caused RML occlusion and collapse. She also had a 1.1 x 1.5 cm right hilar node that was abnormal in appearance. There was a question of a lesion in the dome of the liver. She had been also experiencing a cough and occasional chest tightness. She underwent a biopsy with bronchoscopy and EBUS approach on 06/04/19 revealed an adenocarcinoma consistent with a NSCLC lung cancer. A PET scan on 06/11/19 revealed hypermetabolism in the right hilar mass with postobstructive collapse of the RML, and sub centimeter right paratracheal adenopathy suspicious for nodal disease. No there evidence of disease was noted. She has been presented in thoracic oncology conference and is seen today for further discussion of treatment options.    PREVIOUS RADIATION THERAPY: No   PAST MEDICAL HISTORY:  Past Medical History:  Diagnosis Date  . Back pain   . GERD (gastroesophageal reflux disease)   . Gout    no current problems per patient 05/31/19  . History of epigastric pain    comes and goes  . Hypertension    no meds       PAST SURGICAL HISTORY: Past Surgical History:  Procedure Laterality Date  . MULTIPLE TOOTH EXTRACTIONS    . TONSILLECTOMY    . uterine ablation    . VIDEO BRONCHOSCOPY WITH ENDOBRONCHIAL ULTRASOUND N/A 06/04/2019   Procedure: VIDEO  BRONCHOSCOPY WITH ENDOBRONCHIAL ULTRASOUND;  Surgeon: Margaretha Seeds, MD;  Location: [facility];  Service: Thoracic;  Laterality: N/A;  . WISDOM TOOTH EXTRACTION       FAMILY HISTORY:  Family History  Problem Relation Age of Onset  . Breast cancer Mother   . Cancer Mother   . Seizures Father   . Breast cancer Maternal Grandmother   . Breast cancer Daughter   . Cancer Daughter   . Heart attack Brother      SOCIAL HISTORY:  reports that she has been smoking cigarettes. She has a 23.50 pack-year smoking history. She has never used smokeless tobacco. She reports that she does not drink alcohol or use drugs.   ALLERGIES: Patient has no known allergies.   MEDICATIONS:  Current Outpatient Medications  Medication Sig Dispense Refill  . naproxen sodium (ALEVE) 220 MG tablet Take 440 mg by mouth 2 (two) times daily as needed (back pain.).    Marland Kitchen omeprazole (PRILOSEC) 20 MG capsule Take 1 capsule (20 mg total) by mouth 2 (two) times daily before a meal. To lower stomach acid 60 capsule 5  . prochlorperazine (COMPAZINE) 10 MG tablet Take 1 tablet (10 mg total) by mouth every 6 (six) hours as needed for nausea or vomiting. 30 tablet 0  . varenicline (CHANTIX PAK) 0.5 MG X 11 & 1 MG X 42 tablet Take one 0.5 mg tablet by mouth once daily  for 3 days, then increase to one 0.5 mg tablet twice daily for 4 days, then increase to one 1 mg tablet twice daily. (Patient not taking: Reported on 06/04/2019) 53 tablet 0   No current facility-administered medications for this encounter.     REVIEW OF SYSTEMS: On review of systems, the patient reports that she is doing okay. She is quite anxious per report about her condition. Her son accompanies her and echos her concerns about her anxiety though she feels support of her family in this process. She has occasional shortness of breath, at times productive cough without hemoptysis. Today she denies any chest pain, shortness of breath, cough, fevers, chills, night  sweats.No unintended weight changes are noted. She denies any bowel or bladder disturbances, and denies abdominal pain, nausea or vomiting. She denies any new musculoskeletal or joint aches or pains. A complete review of systems is obtained and is otherwise negative.     PHYSICAL EXAM:  Wt Readings from Last 3 Encounters:  06/20/19 189 lb 8 oz (86 kg)  06/12/19 186 lb 14.4 oz (84.8 kg)  06/04/19 193 lb 11.2 oz (87.9 kg)   Temp Readings from Last 3 Encounters:  06/20/19 99.2 F (37.3 C)  06/12/19 99.4 F (37.4 C) (Oral)  06/04/19 98 F (36.7 C) (Oral)   BP Readings from Last 3 Encounters:  06/20/19 (!) 156/98  06/12/19 (!) 154/83  06/04/19 (!) 143/82   Pulse Readings from Last 3 Encounters:  06/20/19 95  06/12/19 (!) 105  06/04/19 75   In general this is a well appearing caucasian in no acute distress. She's alert and oriented x4 and appropriate throughout the examination. Cardiopulmonary assessment is negative for acute distress and she exhibits normal effort.   ECOG = 1  0 - Asymptomatic (Fully active, able to carry on all predisease activities without restriction)  1 - Symptomatic but completely ambulatory (Restricted in physically strenuous activity but ambulatory and able to carry out work of a light or sedentary nature. For example, light housework, office work)  2 - Symptomatic, <50% in bed during the day (Ambulatory and capable of all self care but unable to carry out any work activities. Up and about more than 50% of waking hours)  3 - Symptomatic, >50% in bed, but not bedbound (Capable of only limited self-care, confined to bed or chair 50% or more of waking hours)  4 - Bedbound (Completely disabled. Cannot carry on any self-care. Totally confined to bed or chair)  5 - Death   Eustace Pen MM, Creech RH, Tormey DC, et al. [phone]). "Toxicity and response criteria of the [facility]". [place] Oncol. 5 (6): 649-55    LABORATORY DATA:  Lab  Results  Component Value Date   WBC 8.8 06/12/2019   HGB 12.5 06/12/2019   HCT 36.7 06/12/2019   MCV 94.8 06/12/2019   PLT 413 (H) 06/12/2019   Lab Results  Component Value Date   NA 139 06/12/2019   K 3.2 (L) 06/12/2019   CL 100 06/12/2019   CO2 27 06/12/2019   Lab Results  Component Value Date   ALT 8 06/12/2019   AST 9 (L) 06/12/2019   ALKPHOS 98 06/12/2019   BILITOT 1.0 06/12/2019      RADIOGRAPHY: DG Chest 2 View  Result Date: 06/04/2019 CLINICAL DATA:  Pre lung biopsy EXAM: CHEST - 2 VIEW COMPARISON:  04/01/2019 FINDINGS: Persistent right middle lobe collapse secondary to infrahilar mass. Left lung is clear. No pleural effusion.  Stable cardiomediastinal contours. No acute osseous abnormality. IMPRESSION: Persistent right middle lobe collapse secondary to infrahilar mass. Electronically Signed   By: Macy Mis M.D.   On: 06/04/2019 08:16   NM PET Image Initial (PI) Skull Base To Thigh  Result Date: 06/11/2019 CLINICAL DATA:  Initial treatment strategy for right lung mass. EXAM: NUCLEAR MEDICINE PET SKULL BASE TO THIGH TECHNIQUE: 9.6 mCi F-18 FDG was injected intravenously. Full-ring PET imaging was performed from the skull base to thigh after the radiotracer. CT data was obtained and used for attenuation correction and anatomic localization. Fasting blood glucose: 106 mg/dl COMPARISON:  Chest CT on 04/26/2019 FINDINGS: Mediastinal blood-pool activity (background): SUV max = 2.8 Liver activity (reference): SUV max = N/A NECK: Significant image degradation due to patient motion. No definite hypermetabolic masses identified within the neck. Incidental CT findings:  None. CHEST: Hypermetabolic mass is seen in the right hilar region. Adjacent right middle lobe collapse is seen showing intense hypermetabolic activity, and therefore the centrally obstructing mass cannot be measured. SUV max obtained in the central right middle lobe/hilar region measures 15.2. Mild hypermetabolic  activity is seen within sub-cm right paratracheal lymph node, with SUV max measuring 3.2. No other suspicious pulmonary nodules identified on CT images. Mild centrilobular emphysema noted. No evidence of pleural effusion. Incidental CT findings:  None. ABDOMEN/PELVIS: No abnormal hypermetabolic activity within the liver, pancreas, adrenal glands, or spleen. No hypermetabolic lymph nodes in the abdomen or pelvis. Incidental CT findings: Small calcified uterine fibroids noted. Tiny hiatal hernia. Aortic atherosclerosis also noted. SKELETON: No focal hypermetabolic bone lesions to suggest skeletal metastasis. Incidental CT findings:  None. IMPRESSION: Hypermetabolic right hilar mass with postobstructive collapse of right middle lobe, consistent with primary bronchogenic carcinoma. Sub-cm hypermetabolic right paratracheal lymph node, suspicious for lymph node metastases. No evidence of distant metastatic disease. Electronically Signed   By: Marlaine Hind M.D.   On: 06/11/2019 19:25       IMPRESSION/PLAN: 1. 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 Dr. Lisbeth Renshaw discusses the pathology findings and reviews the nature of locally advanced lung disease.  Her case was discussed this morning in multidisciplinary oncology conference, and the 2 options presented to her would be the possibility of surgical resection versus chemoradiation.  Based on her imaging it does appear that she would be stage III and would benefit from chemoradiation however she is also highly motivated and has a great performance status to consider surgical resection if appropriate by cardiothoracic surgery.  Unfortunately our plan is for her to meet with surgery today could not occur, so she will be seen early next week.  She is also interested in having a second opinion as her sister is a Stage manager and has oncology connections in [place].  We have asked for her films to be power shared to that facility  and if that is not possible then [employer].  She will meet with Dr. Roxan Hockey on Tuesday, we will then make decisions next week about next steps.  We did describe this process of chemoradiation, in detail the need for simulation if she were to proceed which we could hopefully coordinate next week depending on her decision making.  We discussed the risks, benefits, short, and long term effects of radiotherapy, and the patient will let us know of her decision making next week.. Dr. Lisbeth Renshaw discusses the delivery and logistics of radiotherapy and anticipates a course of 6 1/2 weeks of radiotherapy if she were to proceed.  At  the end of the conversation the patient is in agreement with moving forward in this manner.   In a visit lasting 60 minutes, greater than 50% of the time was spent face to face discussing in preparation, and in coordinating the patient's care.  The above documentation reflects my direct findings during this shared patient visit. Please see the separate note by Dr. Lisbeth Renshaw on this date for the remainder of the patient's plan of care.    Carola Rhine, PAC

## 2019-06-24 NOTE — Progress Notes (Signed)
Pt was a no show for MRI on 06-24-19.

## 2019-06-25 ENCOUNTER — Inpatient Hospital Stay: Payer: Medicaid Other

## 2019-06-25 ENCOUNTER — Institutional Professional Consult (permissible substitution): Payer: Medicaid Other | Admitting: Thoracic Surgery (Cardiothoracic Vascular Surgery)

## 2019-06-25 ENCOUNTER — Other Ambulatory Visit: Payer: Self-pay

## 2019-06-25 ENCOUNTER — Other Ambulatory Visit: Payer: Self-pay | Admitting: *Deleted

## 2019-06-25 ENCOUNTER — Telehealth: Payer: Self-pay | Admitting: Medical Oncology

## 2019-06-25 ENCOUNTER — Encounter: Payer: Self-pay | Admitting: Thoracic Surgery (Cardiothoracic Vascular Surgery)

## 2019-06-25 ENCOUNTER — Inpatient Hospital Stay: Payer: Medicaid Other | Admitting: Internal Medicine

## 2019-06-25 VITALS — BP 147/80 | HR 96 | Temp 97.9°F | Resp 20 | Ht 64.0 in | Wt 189.0 lb

## 2019-06-25 DIAGNOSIS — R918 Other nonspecific abnormal finding of lung field: Secondary | ICD-10-CM | POA: Diagnosis not present

## 2019-06-25 DIAGNOSIS — C3491 Malignant neoplasm of unspecified part of right bronchus or lung: Secondary | ICD-10-CM | POA: Diagnosis not present

## 2019-06-25 NOTE — Progress Notes (Signed)
PCP is Antony Blackbird, MD Referring Provider is Curt Bears, MD  Chief Complaint  Patient presents with  . Lung Mass    surgical consultation    HPI: Janice Adams is sent for consultation regarding a newly diagnosed right lung cancer.  Janice Adams is a 64 year old woman with a history of hypertension, reflux, back pain, and tobacco abuse.  She initially presented to her primary care in November with shortness of breath, cough, and chest tightness.  A chest x-ray showed a right infrahilar mass.  A CT of the chest on 04/26/2019 showed a 5.3 x 3.6 x 3.2 cm infrahilar mass with extension along the airway.  Dr. Loanne Drilling performed bronchoscopy.  Biopsy showed adenocarcinoma.  A PET CT was done which showed the mass was markedly hypermetabolic.  There was some questionable hypermetabolism in a 4R and level 7 nodes.  Aspirations of those nodes at the time of bronchoscopy was negative.  Her chest pain is resolved.  She does still have a cough.  She is not short of breath with routine activities.  She lives at home and cares for 2 of her grandchildren.  She is not having any chest pain, pressure, or tightness.  She does not have any history of cardiac disease.  She denies change in appetite or weight loss.  She smoked about half a pack of cigarettes daily for over 40 years before quitting a month ago.  Zubrod Score: At the time of surgery this patient's most appropriate activity status/level should be described as: [x]     0    Normal activity, no symptoms []     1    Restricted in physical strenuous activity but ambulatory, able to do out light work []     2    Ambulatory and capable of self care, unable to do work activities, up and about >50 % of waking hours                              []     3    Only limited self care, in bed greater than 50% of waking hours []     4    Completely disabled, no self care, confined to bed or chair []     5    Moribund  Past Medical History:  Diagnosis Date  .  Back pain   . GERD (gastroesophageal reflux disease)   . Gout    no current problems per patient 05/31/19  . History of epigastric pain    comes and goes  . Hypertension    no meds    Past Surgical History:  Procedure Laterality Date  . MULTIPLE TOOTH EXTRACTIONS    . TONSILLECTOMY    . uterine ablation    . VIDEO BRONCHOSCOPY WITH ENDOBRONCHIAL ULTRASOUND N/A 06/04/2019   Procedure: VIDEO BRONCHOSCOPY WITH ENDOBRONCHIAL ULTRASOUND;  Surgeon: Margaretha Seeds, MD;  Location: [facility] OR;  Service: Thoracic;  Laterality: N/A;  . WISDOM TOOTH EXTRACTION      Family History  Problem Relation Age of Onset  . Breast cancer Mother   . Cancer Mother   . Seizures Father   . Breast cancer Maternal Grandmother   . Breast cancer Daughter   . Cancer Daughter   . Heart attack Brother     Social History Social History   Tobacco Use  . Smoking status: Current Some Day Smoker    Packs/day: 0.50    Years: 47.00  Pack years: 23.50    Types: Cigarettes  . Smokeless tobacco: Never Used  Substance Use Topics  . Alcohol use: No    Alcohol/week: 0.0 standard drinks  . Drug use: No    Types: Marijuana    Comment: last use Wed 05/28/19    Current Outpatient Medications  Medication Sig Dispense Refill  . naproxen sodium (ALEVE) 220 MG tablet Take 440 mg by mouth 2 (two) times daily as needed (back pain.).    Marland Kitchen omeprazole (PRILOSEC) 20 MG capsule Take 1 capsule (20 mg total) by mouth 2 (two) times daily before a meal. To lower stomach acid 60 capsule 5  . prochlorperazine (COMPAZINE) 10 MG tablet Take 1 tablet (10 mg total) by mouth every 6 (six) hours as needed for nausea or vomiting. 30 tablet 0  . varenicline (CHANTIX PAK) 0.5 MG X 11 & 1 MG X 42 tablet Take one 0.5 mg tablet by mouth once daily for 3 days, then increase to one 0.5 mg tablet twice daily for 4 days, then increase to one 1 mg tablet twice daily. 53 tablet 0   No current facility-administered medications for this visit.     No Known Allergies  Review of Systems  Constitutional: Negative for activity change, appetite change and unexpected weight change.  HENT: Negative for trouble swallowing and voice change.   Respiratory: Positive for cough. Negative for shortness of breath.   Cardiovascular: Negative for chest pain (Resolved).  Gastrointestinal: Positive for abdominal pain (Occasional epigastric pain).  Genitourinary: Negative for difficulty urinating and dysuria.  Musculoskeletal: Negative for arthralgias and myalgias.  Neurological: Negative for seizures, syncope and weakness.  Hematological: Negative for adenopathy. Does not bruise/bleed easily.  All other systems reviewed and are negative.   BP (!) 147/80 (BP Location: Left Arm)   Pulse 96   Temp 97.9 F (36.6 C) (Temporal)   Resp 20   Ht 5\' 4"  (1.626 m)   Wt 189 lb (85.7 kg)   LMP  (LMP Unknown)   SpO2 97% Comment: RA  BMI 32.44 kg/m  Physical Exam Vitals reviewed.  Constitutional:      General: She is not in acute distress.    Appearance: Normal appearance.  HENT:     Head: Normocephalic and atraumatic.  Eyes:     General: No scleral icterus.    Extraocular Movements: Extraocular movements intact.  Cardiovascular:     Rate and Rhythm: Normal rate and regular rhythm.     Pulses: Normal pulses.     Heart sounds: Normal heart sounds. No murmur. No friction rub. No gallop.   Pulmonary:     Effort: Pulmonary effort is normal. No respiratory distress.     Breath sounds: No wheezing or rales.  Abdominal:     General: There is no distension.     Palpations: Abdomen is soft.     Tenderness: There is no abdominal tenderness.  Musculoskeletal:        General: No swelling.     Cervical back: Neck supple.  Lymphadenopathy:     Cervical: No cervical adenopathy.  Skin:    General: Skin is warm and dry.  Neurological:     General: No focal deficit present.     Mental Status: She is alert and oriented to person, place, and time.      Cranial Nerves: No cranial nerve deficit.     Motor: No weakness.    Diagnostic Tests: CT CHEST WITH CONTRAST  TECHNIQUE: Multidetector CT imaging of the  chest was performed during intravenous contrast administration.  CONTRAST:  80mL OMNIPAQUE IOHEXOL 300 MG/ML  SOLN  COMPARISON:  Chest x-ray April 01, 2019.  FINDINGS: Cardiovascular: The heart size is normal. No obvious coronary artery calcifications are identified. The central pulmonary arteries are normal in caliber. The thoracic aorta demonstrates atherosclerotic change without aneurysm or dissection.  Mediastinum/Nodes: Subcutaneous nodularity is seen along the lateral right breast as seen on images 5 and 12 of series 3, measuring up to 19 mm. I suspect these are sebaceous or inclusion cysts. The chest wall is otherwise unremarkable. No adenopathy in the base of neck or axillary regions. No mediastinal adenopathy is noted. The left hilum is without adenopathy as well. There is a mass in the right infrahilar region measuring 5.3 x 3.6 by 3.2 cm. It is unclear whether this mass is centered in the lung or hilum. There is soft tissue nodularity within the right infrahilar region which could represent extension of the mass or a right hilar node on series 3, image 70 measuring 1.1 x 1.5 cm. No effusions. The thyroid is normal. The esophagus is normal.  Lungs/Pleura: The trachea and mainstem bronchi are normal. The mass either within the inferior right hilum or the adjacent lung encases the right middle lobe bronchus with abrupt cut off and collapse of the right middle lobe. Other airways are normal. No pneumothorax. Emphysematous changes are seen, particularly in the apices. No nodules or masses are seen in the left lung. There is platelike opacity in the anterior right lower lobe consistent with atelectasis. There is a nodule medially in the right chest on series 4, image 60, within the superior segment of the  right lower lobe. This nodule measures 12 mm. A few scattered small nodules are seen in the right upper lobe such as on series 4, images 27, 43, 48, and 51. A few tiny nodules are seen in the left upper lobe such as on series 4, images 23, 26, and 30.  Upper Abdomen: Images of the liver very noisy. A small lesion in the dome is not excluded on series 3, image 105 measuring 10 mm. The upper abdomen is otherwise normal.  Musculoskeletal: No definite bony metastatic disease is identified.  IMPRESSION: 1. There is a mass in the right infrahilar region which could arise from lung or the hilum itself. The finding is consistent with a primary lung malignancy. The mass occludes the right middle lobe bronchus centrally with right middle lobe collapse. The mass encases pulmonary arterial branches supplying the right middle lobe. 2. There is a nodule in the medial aspect of the right lower lobe superior segment which could represent a second primary malignancy or a metastatic lesion. PET-CT may better evaluate. 3. Soft tissue in the right hilum on series 3, image 70 could represent extension of the primary mass versus a right hilar node. 4. Multiple small nodules in the bilateral upper lobes, right greater than left, are nonspecific. 5. Limited visualization of the liver dome. A subtle lesion is not excluded measuring 10 mm on series 3, image 11. Dedicated imaging of the liver could better evaluate. An MRI would be most sensitive. 6. Atherosclerotic change in the thoracic aorta. 7. Subcutaneous nodularity along the lateral right breast is favored to represent sebaceous or inclusion cysts. 8. Emphysematous changes, particularly in the apices.  These results will be called to the ordering clinician or representative by the Radiologist Assistant, and communication documented in the PACS or zVision Dashboard.  Aortic Atherosclerosis (ICD10-I70.0)  and Emphysema  (ICD10-J43.9).   Electronically Signed   By: Dorise Bullion III M.D   On: 04/27/2019 14:39 NUCLEAR MEDICINE PET SKULL BASE TO THIGH  TECHNIQUE: 9.6 mCi F-18 FDG was injected intravenously. Full-ring PET imaging was performed from the skull base to thigh after the radiotracer. CT data was obtained and used for attenuation correction and anatomic localization.  Fasting blood glucose: 106 mg/dl  COMPARISON:  Chest CT on 04/26/2019  FINDINGS: Mediastinal blood-pool activity (background): SUV max = 2.8  Liver activity (reference): SUV max = N/A  NECK: Significant image degradation due to patient motion. No definite hypermetabolic masses identified within the neck.  Incidental CT findings:  None.  CHEST: Hypermetabolic mass is seen in the right hilar region. Adjacent right middle lobe collapse is seen showing intense hypermetabolic activity, and therefore the centrally obstructing mass cannot be measured. SUV max obtained in the central right middle lobe/hilar region measures 15.2.  Mild hypermetabolic activity is seen within sub-cm right paratracheal lymph node, with SUV max measuring 3.2. No other suspicious pulmonary nodules identified on CT images. Mild centrilobular emphysema noted. No evidence of pleural effusion.  Incidental CT findings:  None.  ABDOMEN/PELVIS: No abnormal hypermetabolic activity within the liver, pancreas, adrenal glands, or spleen. No hypermetabolic lymph nodes in the abdomen or pelvis.  Incidental CT findings: Small calcified uterine fibroids noted. Tiny hiatal hernia. Aortic atherosclerosis also noted.  SKELETON: No focal hypermetabolic bone lesions to suggest skeletal metastasis.  Incidental CT findings:  None.  IMPRESSION: Hypermetabolic right hilar mass with postobstructive collapse of right middle lobe, consistent with primary bronchogenic carcinoma.  Sub-cm hypermetabolic right paratracheal lymph node, suspicious  for lymph node metastases.  No evidence of distant metastatic disease.   Electronically Signed   By: Marlaine Hind M.D.   On: 06/11/2019 19:25 I personally reviewed the CT and PET/CT images and concur with the findings noted above  Impression: Janice Adams is a 64 year old woman with a history of tobacco abuse, hypertension, reflux, and gout.  She recently presented with chest discomfort in the setting of cough and shortness of breath.  Work-up revealed a right hilar mass.  By CT there is a 5.3 x 3.6 x 3.2 cm right middle lobe mass with some postobstructive atelectasis.  On PET CT this area is markedly hypermetabolic.  There is definite hilar adenopathy particularly along the bronchus intermedius and involving the pulmonary artery and pulmonary vein.  There was some borderline metabolic activity in mediastinal lymph nodes but aspirations of those were negative for tumor.  She has a T3N1, stage IIIa adenocarcinoma.  She has no evidence of distant metastases by PET.  She does need an MRI of the brain. There is a note in the chart that she missed an MRI of the brain scheduled yesterday.  She was unaware of that test.  We will reschedule.  I had a long discussion with Janice Adams and her son.  I emphasized that multimodality therapy will be necessary to have any chance of a cure in this setting.  Even with multimodality therapy including a possible complete resection there is no guarantee of a cure.  Options include surgical resection.  That would require pneumonectomy.  She does not have any major comorbidities that preclude that option, but has not had pulmonary function testing yet.  We will try to arrange pulmonary function testing and have her come back in a week to go over those.  Another option would be to treat with neoadjuvant chemoradiation to see  if we can shrink the tumor down and potentially resect it with a bilobectomy.  I emphasized that either way she is going to need systemic  therapy with chemotherapy.  I did briefly discuss with her the operative procedure.  It would require a thoracotomy.  We could look him with a scope first to make sure there is nothing that precludes resection before making a major incision but this will require an open resection.  She understands that would need to be done under general anesthesia, the incisions to be used, the need for drains to postoperatively, the expected hospital stay, and the overall recovery.  She understands that she would have some lifelong limitations postoperatively.  I informed her of the indications, risks, benefits, and alternatives.  She understands the risks include but are not limited to death, MI, DVT, PE, bleeding, possible need for transfusion, infection, air leak, cardiac arrhythmias, as well as possibility of other unforeseeable complications.   Plan: MRI of brain to rule out metastases Pulmonary function testing Return in 1 week to discuss possible pneumonectomy.  Melrose Nakayama, MD [facility] [phone]

## 2019-06-25 NOTE — Telephone Encounter (Signed)
Son, Leward Quan,  stated mother is getting "second opinion at [facility]" in [place] is going to review her records.  I told son we will call her back with next appt.

## 2019-06-26 ENCOUNTER — Other Ambulatory Visit: Payer: Self-pay | Admitting: *Deleted

## 2019-06-26 NOTE — Progress Notes (Unsigned)
Co

## 2019-06-28 ENCOUNTER — Other Ambulatory Visit (HOSPITAL_COMMUNITY)
Admission: RE | Admit: 2019-06-28 | Discharge: 2019-06-28 | Disposition: A | Discharge status: Home / Self Care | Payer: Medicaid Other | Source: Ambulatory Visit | Attending: Thoracic Surgery (Cardiothoracic Vascular Surgery) | Admitting: Thoracic Surgery (Cardiothoracic Vascular Surgery)

## 2019-06-28 ENCOUNTER — Other Ambulatory Visit: Payer: Medicaid Other

## 2019-06-28 ENCOUNTER — Ambulatory Visit (HOSPITAL_COMMUNITY)
Admission: RE | Admit: 2019-06-28 | Discharge: 2019-06-28 | Disposition: A | Discharge status: Home / Self Care | Payer: Medicaid Other | Source: Ambulatory Visit | Attending: Internal Medicine | Admitting: Internal Medicine

## 2019-06-28 ENCOUNTER — Other Ambulatory Visit (HOSPITAL_COMMUNITY): Payer: Medicaid Other

## 2019-06-28 ENCOUNTER — Ambulatory Visit: Payer: Medicaid Other

## 2019-06-28 ENCOUNTER — Other Ambulatory Visit: Payer: Self-pay

## 2019-06-28 DIAGNOSIS — Z20822 Contact with and (suspected) exposure to covid-19: Secondary | ICD-10-CM | POA: Insufficient documentation

## 2019-06-28 DIAGNOSIS — Z01812 Encounter for preprocedural laboratory examination: Secondary | ICD-10-CM | POA: Diagnosis present

## 2019-06-28 DIAGNOSIS — C3490 Malignant neoplasm of unspecified part of unspecified bronchus or lung: Secondary | ICD-10-CM | POA: Diagnosis not present

## 2019-06-28 LAB — SARS CORONAVIRUS 2 (TAT 6-24 HRS): SARS Coronavirus 2: NEGATIVE

## 2019-06-28 IMAGING — MR MR HEAD WO/W CM
16 of 17 series · 41 of 48 positions shown · IV contrast (gadavist)
Comparison: None.

CLINICAL DATA: Lung cancer, staging

EXAM:
MRI HEAD WITHOUT AND WITH CONTRAST
TECHNIQUE: Multiplanar, multiecho pulse sequences of the brain and surrounding
structures were obtained without and with intravenous contrast.
CONTRAST:  8mL GADAVIST GADOBUTROL 1 MMOL/ML IV SOLN

[Series 5: T1 · sagittal · 5.0mm · 0.75mm/px · 1 of 24 slices shown (1 of 4)]
[im 1/24]
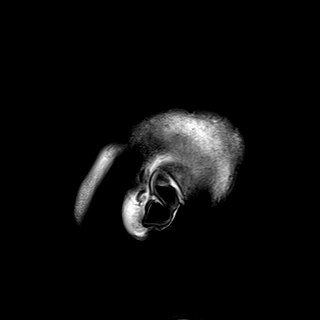

[Series 6: T2 · axial · 5.0mm · 0.62mm/px · 1 of 24 slices shown]
[im 1/24]
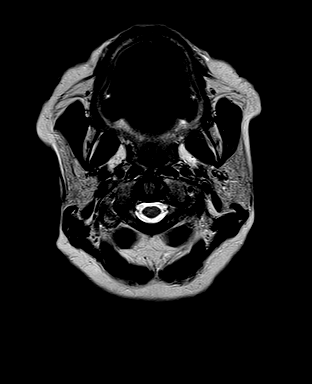

[Series 7: DWI · axial · 5.0mm · 1.36mm/px · z∈[-31,+116]mm · 3 of 48 slices shown (1 of 4)]
[im 1/48]
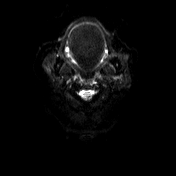
[im 24/48]
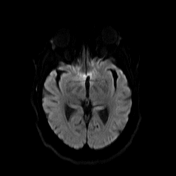
[im 48/48]
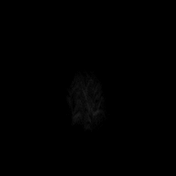

[Series 8: DWI · axial · 5.0mm · 1.36mm/px · 1 of 23 slices shown (2 of 4)]
[im 1/23]
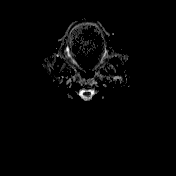

[Series 9: mip_images(sw) · axial · 24.0mm · 0.75mm/px · z∈[-18,+100]mm · 2 of 41 slices shown]
[im 1/41]
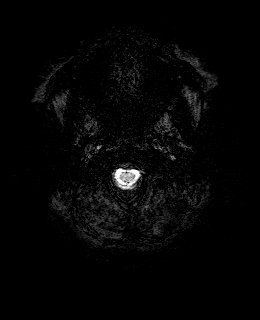
[im 41/41]
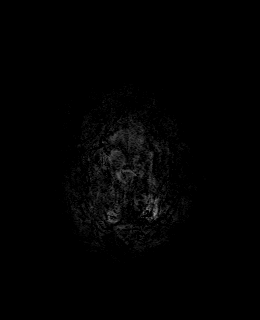

[Series 10: swi_images · axial · 3.0mm · 0.75mm/px · z∈[-29,+110]mm · 3 of 48 slices shown]
[im 1/48]
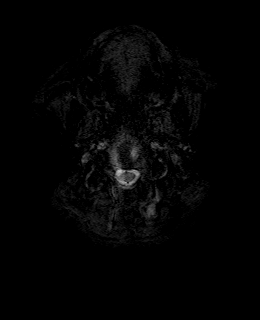
[im 24/48]
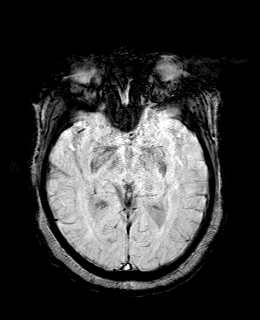
[im 48/48]
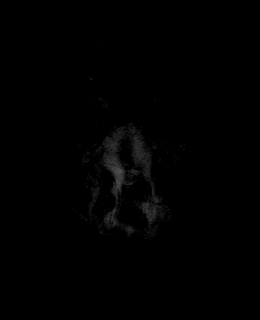

[Series 11: FLAIR · axial · 3.0mm · 0.75mm/px · z∈[-26,+107]mm · 2 of 46 slices shown]
[im 1/46]
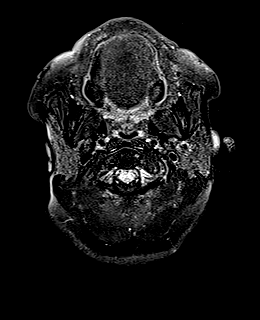
[im 46/46]
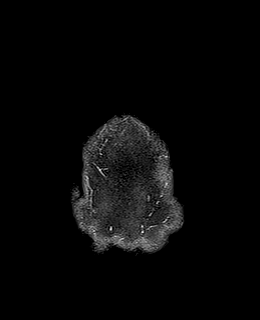

[Series 12: T1 · axial · 1.0mm · 0.94mm/px · z∈[-30,+111]mm · 8 of 144 slices shown (2 of 4)]
[im 1/144]
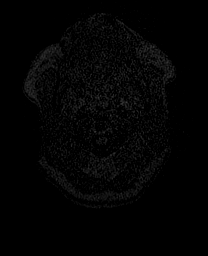
[im 21/144]
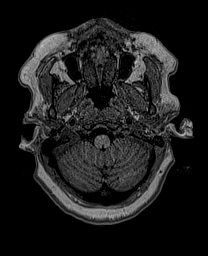
[im 41/144]
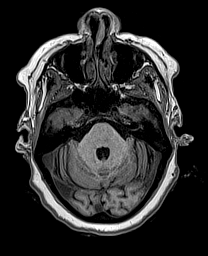
[im 62/144]
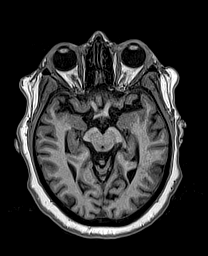
[im 82/144]
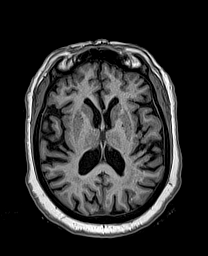
[im 103/144]
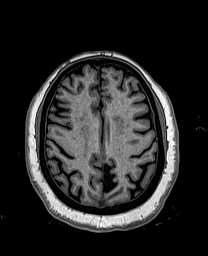
[im 123/144]
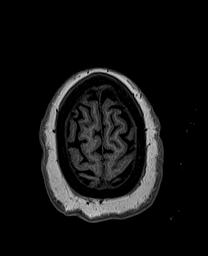
[im 144/144]
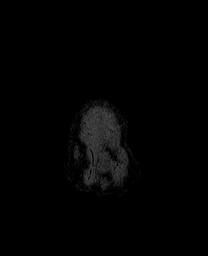

[Series 13: DWI · coronal · 5.0mm · 1.31mm/px · 3 of 64 slices shown (3 of 4)]
[im 1/64]
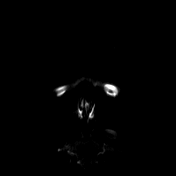
[im 32/64]
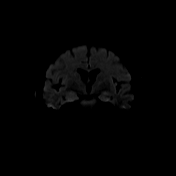
[im 64/64]
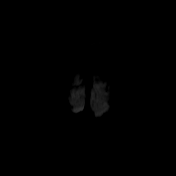

[Series 14: DWI · coronal · 5.0mm · 1.31mm/px · 2 of 32 slices shown (4 of 4)]
[im 1/32]
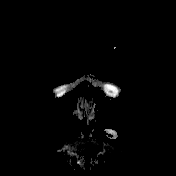
[im 32/32]
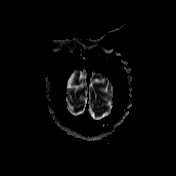

[Series 15: T2 post-contrast · coronal · 5.0mm · 0.57mm/px · 1 of 24 slices shown]
[im 1/24]
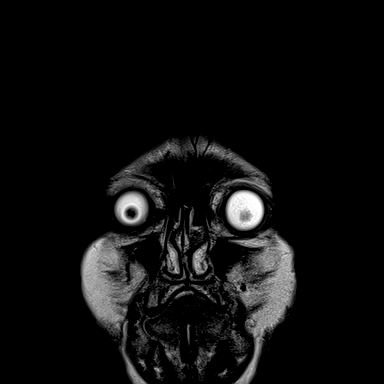

[Series 16: T1 post-contrast · axial · 1.0mm · 0.94mm/px · z∈[-30,+111]mm · 8 of 144 slices shown (1 of 3)]
[im 1/144]
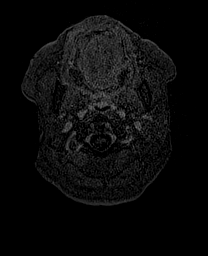
[im 21/144]
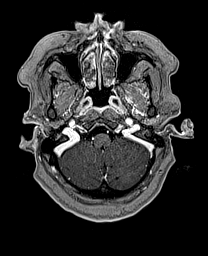
[im 41/144]
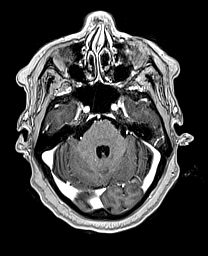
[im 62/144]
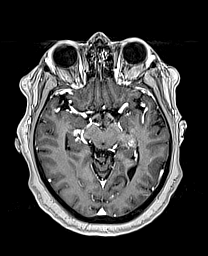
[im 82/144]
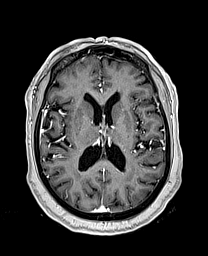
[im 103/144]
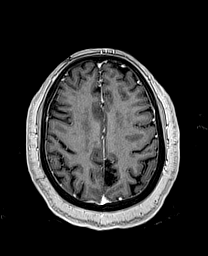
[im 123/144]
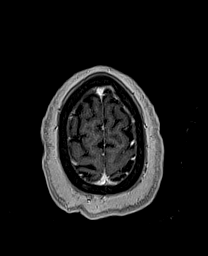
[im 144/144]
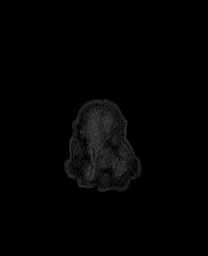

[Series 17: T1 · sagittal · 4.0mm · 0.94mm/px · 2 of 30 slices shown (3 of 4)]
[im 1/30]
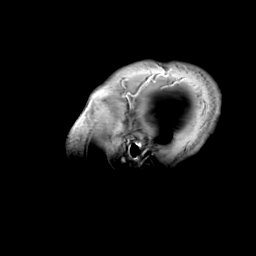
[im 30/30]
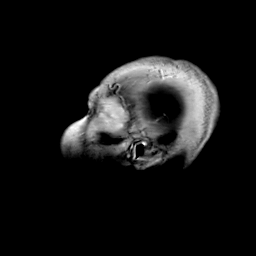

[Series 18: T1 · coronal · 4.0mm · 0.94mm/px · 2 of 30 slices shown (4 of 4)]
[im 1/30]
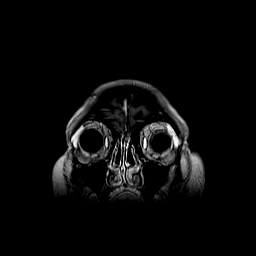
[im 30/30]
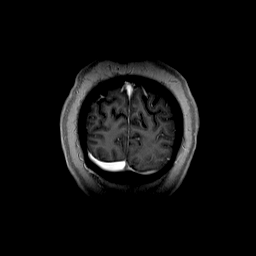

[Series 19: T1 post-contrast · coronal · 5.0mm · 0.43mm/px · 1 of 24 slices shown (2 of 3)]
[im 1/24]
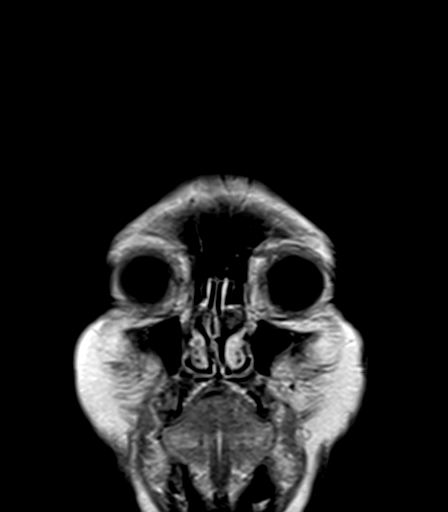

[Series 20: T1 post-contrast · sagittal · 5.0mm · 0.75mm/px · 1 of 24 slices shown (3 of 3)]
[im 1/24]
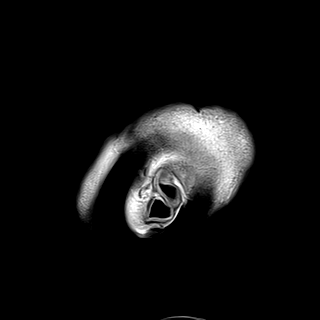

[41 of 48 positions shown; findings below may reference images not displayed]

FINDINGS: Brain: There is no acute infarction or intracranial hemorrhage.
There is no intracranial mass, mass effect, or edema. There is no
hydrocephalus or extra-axial fluid collection. Patchy and confluent
areas of T2 hyperintensity in the supratentorial white matter are
nonspecific but may reflect mild to moderate chronic microvascular
ischemic changes. Punctate focus of susceptibility in the left
frontal subcortical white matter is compatible with chronic
microhemorrhage or mineralization. No abnormal enhancement.

Vascular: Major vessel flow voids at the skull base are preserved.

Skull and upper cervical spine: Abnormal marrow signal at the
opposing endplates of C3 and C4 is probably on a degenerative basis.
There may be significant canal stenosis at this level.

Sinuses/Orbits: Paranasal sinuses are aerated. Orbits are
unremarkable.

Other: Sella is unremarkable.  Mastoid air cells are clear.
IMPRESSION: No evidence of intracranial metastatic disease.

Abnormal marrow signal at the opposing C3-C4 endplates is likely on
a degenerative basis. There may be significant canal stenosis at
this level.

## 2019-06-28 MED ORDER — GADOBUTROL 1 MMOL/ML IV SOLN
8.0000 mL | Freq: Once | INTRAVENOUS | Status: AC | PRN
  Administered 2019-06-28: 08:00:00 8 mL via INTRAVENOUS

## 2019-07-01 ENCOUNTER — Other Ambulatory Visit: Payer: Self-pay

## 2019-07-01 ENCOUNTER — Ambulatory Visit (HOSPITAL_COMMUNITY)
Admission: RE | Admit: 2019-07-01 | Discharge: 2019-07-01 | Disposition: A | Discharge status: Home / Self Care | Payer: Medicaid Other | Source: Ambulatory Visit | Attending: Thoracic Surgery (Cardiothoracic Vascular Surgery) | Admitting: Thoracic Surgery (Cardiothoracic Vascular Surgery)

## 2019-07-01 DIAGNOSIS — C3491 Malignant neoplasm of unspecified part of right bronchus or lung: Secondary | ICD-10-CM | POA: Diagnosis present

## 2019-07-01 LAB — PULMONARY FUNCTION TEST
DL/VA % pred: 88 %
DL/VA: 3.71 ml/min/mmHg/L
DLCO cor % pred: 69 %
DLCO cor: 13.8 ml/min/mmHg
DLCO unc % pred: 67 %
DLCO unc: 13.4 ml/min/mmHg
FEF 25-75 Post: 1.82 L/sec
FEF 25-75 Pre: 1.89 L/sec
FEF2575-%Change-Post: -3 %
FEF2575-%Pred-Post: 94 %
FEF2575-%Pred-Pre: 98 %
FEV1-%Change-Post: 0 %
FEV1-%Pred-Post: 94 %
FEV1-%Pred-Pre: 95 %
FEV1-Post: 1.88 L
FEV1-Pre: 1.9 L
FEV1FVC-%Change-Post: -1 %
FEV1FVC-%Pred-Pre: 103 %
FEV6-%Change-Post: 0 %
FEV6-%Pred-Post: 95 %
FEV6-%Pred-Pre: 95 %
FEV6-Post: 2.34 L
FEV6-Pre: 2.33 L
FEV6FVC-%Pred-Post: 103 %
FEV6FVC-%Pred-Pre: 103 %
FVC-%Change-Post: 0 %
FVC-%Pred-Post: 91 %
FVC-%Pred-Pre: 91 %
FVC-Post: 2.34 L
FVC-Pre: 2.33 L
Post FEV1/FVC ratio: 81 %
Post FEV6/FVC ratio: 100 %
Pre FEV1/FVC ratio: 81 %
Pre FEV6/FVC Ratio: 100 %
RV % pred: 87 %
RV: 1.8 L
TLC % pred: 86 %
TLC: 4.36 L

## 2019-07-01 MED ORDER — ALBUTEROL SULFATE (2.5 MG/3ML) 0.083% IN NEBU
2.5000 mg | INHALATION_SOLUTION | Freq: Once | RESPIRATORY_TRACT | Status: AC
  Administered 2019-07-01: 2.5 mg via RESPIRATORY_TRACT

## 2019-07-02 ENCOUNTER — Ambulatory Visit (INDEPENDENT_AMBULATORY_CARE_PROVIDER_SITE_OTHER): Payer: Medicaid Other | Admitting: Thoracic Surgery (Cardiothoracic Vascular Surgery)

## 2019-07-02 ENCOUNTER — Inpatient Hospital Stay: Payer: Medicaid Other

## 2019-07-02 ENCOUNTER — Other Ambulatory Visit: Payer: Self-pay

## 2019-07-02 ENCOUNTER — Encounter: Payer: Self-pay | Admitting: Thoracic Surgery (Cardiothoracic Vascular Surgery)

## 2019-07-02 ENCOUNTER — Encounter: Payer: Medicaid Other | Admitting: Thoracic Surgery (Cardiothoracic Vascular Surgery)

## 2019-07-02 ENCOUNTER — Telehealth: Payer: Self-pay

## 2019-07-02 VITALS — BP 150/92 | HR 87 | Temp 97.9°F | Resp 20 | Ht 64.0 in | Wt 192.0 lb

## 2019-07-02 DIAGNOSIS — R918 Other nonspecific abnormal finding of lung field: Secondary | ICD-10-CM | POA: Diagnosis not present

## 2019-07-02 NOTE — Progress Notes (Signed)
[address]       ,[place] [address]             [phone]      HPI: Janice Adams comes back to discuss management of her right middle lobe adenocarcinoma.  Janice Adams is a 64 year old woman with a history of hypertension, gastroesophageal reflux, back pain, and tobacco abuse.  She presented in November with shortness of breath, cough, and chest tightness.  The symptoms have since resolved.  A chest x-ray at the time showed a right infrahilar mass.  On CT of the chest she had a 5.3 x 3.6 x 3.2 cm infrahilar mass with extension along the bronchus intermedius.  A PET CT was done which showed some questionable hypermetabolism in the 4R and 7 nodes but the mass itself was markedly hypermetabolic.  Dr. Loanne Drilling performed bronchoscopy and biopsies were positive for adenocarcinoma.  Lymph node aspirations were negative.    She was seen in consultation by Dr. Julien Nordmann and Janice Adams from radiation oncology.  I saw her in the office last week to discuss possible surgical resection.  Reviewing her films it appears that a pneumonectomy would be required for complete resection.  Is not guaranteed that that would be possible.  We needed the results of her MRI of the brain and her pulmonary function testing before further discussing surgery.  I did emphasize to her the importance of multimodality therapy with an advanced age tumor such as this which is at least 2B and possibly 3A depending on hilar nodes.    Past Medical History:  Diagnosis Date  . Back pain   . GERD (gastroesophageal reflux disease)   . Gout    no current problems per patient 05/31/19  . History of epigastric pain    comes and goes  . Hypertension    no meds    Current Outpatient Medications  Medication Sig Dispense Refill  . naproxen sodium (ALEVE) 220 MG tablet Take 440 mg by mouth 2 (two) times daily as needed (back pain.).    Marland Kitchen omeprazole (PRILOSEC) 20 MG capsule Take 1 capsule (20 mg total) by  mouth 2 (two) times daily before a meal. To lower stomach acid 60 capsule 5  . prochlorperazine (COMPAZINE) 10 MG tablet Take 1 tablet (10 mg total) by mouth every 6 (six) hours as needed for nausea or vomiting. 30 tablet 0  . varenicline (CHANTIX PAK) 0.5 MG X 11 & 1 MG X 42 tablet Take one 0.5 mg tablet by mouth once daily for 3 days, then increase to one 0.5 mg tablet twice daily for 4 days, then increase to one 1 mg tablet twice daily. (Patient not taking: Reported on 07/02/2019) 53 tablet 0   No current facility-administered medications for this visit.    Physical Exam BP (!) 150/92 (BP Location: Right Arm, Patient Position: Sitting, Cuff Size: Normal)   Pulse 87   Temp 97.9 F (36.6 C) (Temporal)   Resp 20   Ht 5\' 4"  (1.626 m)   Wt 192 lb (87.1 kg)   LMP  (LMP Unknown)   SpO2 93% Comment: RA  BMI 32.37 kg/m  64 year old woman in no acute distress Alert and oriented x3 with no focal deficits  Diagnostic Tests: I again reviewed the CT images. MRI HEAD WITHOUT AND WITH CONTRAST  TECHNIQUE: Multiplanar, multiecho pulse sequences of the brain and surrounding structures were obtained without and with intravenous contrast.  CONTRAST:  71mL GADAVIST GADOBUTROL 1  MMOL/ML IV SOLN  COMPARISON:  None.  FINDINGS: Brain: There is no acute infarction or intracranial hemorrhage. There is no intracranial mass, mass effect, or edema. There is no hydrocephalus or extra-axial fluid collection. Patchy and confluent areas of T2 hyperintensity in the supratentorial white matter are nonspecific but may reflect mild to moderate chronic microvascular ischemic changes. Punctate focus of susceptibility in the left frontal subcortical white matter is compatible with chronic microhemorrhage or mineralization. No abnormal enhancement.  Vascular: Major vessel flow voids at the skull base are preserved.  Skull and upper cervical spine: Abnormal marrow signal at the opposing endplates of C3  and C4 is probably on a degenerative basis. There may be significant canal stenosis at this level.  Sinuses/Orbits: Paranasal sinuses are aerated. Orbits are unremarkable.  Other: Sella is unremarkable.  Mastoid air cells are Janice.  IMPRESSION: No evidence of intracranial metastatic disease.  Abnormal marrow signal at the opposing C3-C4 endplates is likely on a degenerative basis. There may be significant canal stenosis at this level.   Electronically Signed   By: Macy Mis M.D.   On: 06/28/2019 09:05 Pulmonary function testing FVC 2.33 (91%) FEV1 1.90 (95%) DLCO 13.80 (69%)  Impression: Janice Adams is a 64 year old woman with a history of tobacco abuse who recent was found to have an adenocarcinoma of the right middle lobe.  This is a T3, N0 or 1, stage IIa or IIIb tumor.  She is a good operative candidate.  Unfortunately, based on review of the CTs I think with primary surgery she would require pneumonectomy.  It is possible that if she has a good response to neoadjuvant chemoradiation that we might be able to preserve the right upper lobe.  She understands there is always a possibility of that we might find something intraoperatively that would preclude resection.  I emphasized that she will need multimodality therapy even if she has a complete surgical resection.  By giving neoadjuvant therapy upfront I think there is a better chance that we can preserve the right upper lobe.  She was comforted to know that her MRI of the brain was negative for metastatic disease.  She does have adequate pulmonary function to tolerate resection.  After discussion she is comfortable with the plan for neoadjuvant chemo radiation followed by surgery.  Plan: She will undergo neoadjuvant chemoradiation prior to probable surgical resection.  I spoke with Dr. Julien Nordmann and his office will arrange to begin treatment  Melrose Nakayama, MD [facility] [phone]

## 2019-07-02 NOTE — Telephone Encounter (Signed)
Missed Chemo Appt: [name] patient about her missed appointment for chemo today. Patient stated that she was not aware of any appointment. Enquired if she would be willing to come in tomorrow patient declined and insisting that Dr. Julien Nordmann told her he would be calling her next week to discuss treatment. Tried to explain to patient that the treatment appointment is what she missed today but she got irritable and insisted on waiting to hear from Dr. Julien Nordmann next week.

## 2019-07-02 NOTE — Telephone Encounter (Signed)
Called pt and told her she id not have an appt today . We forgot to cancel her appt . Dr Julien Nordmann spoke to Dr Roxan Hockey and the plan is to start chemo and radiation next week. Pt notified of appt.

## 2019-07-03 ENCOUNTER — Telehealth: Payer: Self-pay | Admitting: Radiation Oncology

## 2019-07-03 NOTE — Telephone Encounter (Signed)
I called the patient to let her know I had seen Dr. Leonarda Salon notes and that she wished to proceed with chemoRT. I tried talking to her about her decision that had been documented to start therapy. She acknowledged that she had decided for treatment and agreed to simulation next Tuesday at 8 am but then hung up abruptly. I called her daughter and she will call me back to discuss details. I did give her daughter simulation appt at 8 am on 3/2 as well. I've reached out to medical oncology nursing too.

## 2019-07-03 NOTE — Telephone Encounter (Signed)
ATC x3 will route to front desk pool needs follow up with JE in 2 weeks now

## 2019-07-05 NOTE — Telephone Encounter (Signed)
lmtcb X4 for pt to schedule appt.   This has been forwarded to front desk pool for follow up.

## 2019-07-08 ENCOUNTER — Other Ambulatory Visit: Payer: Self-pay

## 2019-07-09 ENCOUNTER — Inpatient Hospital Stay: Payer: Medicaid Other

## 2019-07-09 ENCOUNTER — Other Ambulatory Visit: Payer: Self-pay

## 2019-07-09 ENCOUNTER — Encounter: Payer: Self-pay | Admitting: Internal Medicine

## 2019-07-09 ENCOUNTER — Inpatient Hospital Stay: Payer: Medicaid Other | Attending: Internal Medicine | Admitting: Internal Medicine

## 2019-07-09 VITALS — BP 121/88 | HR 79 | Temp 98.6°F | Resp 18

## 2019-07-09 VITALS — BP 122/88 | HR 91 | Temp 98.3°F | Resp 20 | Ht 64.0 in | Wt 189.6 lb

## 2019-07-09 DIAGNOSIS — Z5111 Encounter for antineoplastic chemotherapy: Secondary | ICD-10-CM | POA: Diagnosis not present

## 2019-07-09 DIAGNOSIS — Z7189 Other specified counseling: Secondary | ICD-10-CM

## 2019-07-09 DIAGNOSIS — C342 Malignant neoplasm of middle lobe, bronchus or lung: Secondary | ICD-10-CM | POA: Insufficient documentation

## 2019-07-09 DIAGNOSIS — C3491 Malignant neoplasm of unspecified part of right bronchus or lung: Secondary | ICD-10-CM | POA: Diagnosis not present

## 2019-07-09 LAB — CBC WITH DIFFERENTIAL (CANCER CENTER ONLY)
Abs Immature Granulocytes: 0.01 10*3/uL (ref 0.00–0.07)
Basophils Absolute: 0 10*3/uL (ref 0.0–0.1)
Basophils Relative: 1 %
Eosinophils Absolute: 0.1 10*3/uL (ref 0.0–0.5)
Eosinophils Relative: 2 %
HCT: 40.2 % (ref 36.0–46.0)
Hemoglobin: 13.3 g/dL (ref 12.0–15.0)
Immature Granulocytes: 0 %
Lymphocytes Relative: 44 %
Lymphs Abs: 2.3 10*3/uL (ref 0.7–4.0)
MCH: 32.2 pg (ref 26.0–34.0)
MCHC: 33.1 g/dL (ref 30.0–36.0)
MCV: 97.3 fL (ref 80.0–100.0)
Monocytes Absolute: 0.6 10*3/uL (ref 0.1–1.0)
Monocytes Relative: 11 %
Neutro Abs: 2.1 10*3/uL (ref 1.7–7.7)
Neutrophils Relative %: 42 %
Platelet Count: 315 10*3/uL (ref 150–400)
RBC: 4.13 MIL/uL (ref 3.87–5.11)
RDW: 13.2 % (ref 11.5–15.5)
WBC Count: 5.1 10*3/uL (ref 4.0–10.5)
nRBC: 0 % (ref 0.0–0.2)

## 2019-07-09 LAB — CMP (CANCER CENTER ONLY)
ALT: 8 U/L (ref 0–44)
AST: 11 U/L — ABNORMAL LOW (ref 15–41)
Albumin: 3.9 g/dL (ref 3.5–5.0)
Alkaline Phosphatase: 117 U/L (ref 38–126)
Anion gap: 9 (ref 5–15)
BUN: 13 mg/dL (ref 8–23)
CO2: 27 mmol/L (ref 22–32)
Calcium: 9.4 mg/dL (ref 8.9–10.3)
Chloride: 106 mmol/L (ref 98–111)
Creatinine: 1.09 mg/dL — ABNORMAL HIGH (ref 0.44–1.00)
GFR, Est AFR Am: >60 mL/min (ref >60)
GFR, Estimated: 54 mL/min — ABNORMAL LOW (ref >60)
Glucose, Bld: 105 mg/dL — ABNORMAL HIGH (ref 70–99)
Potassium: 4.3 mmol/L (ref 3.5–5.1)
Sodium: 142 mmol/L (ref 135–145)
Total Bilirubin: 0.4 mg/dL (ref 0.3–1.2)
Total Protein: 8.2 g/dL — ABNORMAL HIGH (ref 6.5–8.1)

## 2019-07-09 MED ORDER — SODIUM CHLORIDE 0.9 % IV SOLN
20.0000 mg | Freq: Once | INTRAVENOUS | Status: AC
  Administered 2019-07-09: 20 mg via INTRAVENOUS
  Filled 2019-07-09: qty 20

## 2019-07-09 MED ORDER — DIPHENHYDRAMINE HCL 50 MG/ML IJ SOLN
INTRAMUSCULAR | Status: AC
  Filled 2019-07-09: qty 1

## 2019-07-09 MED ORDER — SODIUM CHLORIDE 0.9 % IV SOLN
45.0000 mg/m2 | Freq: Once | INTRAVENOUS | Status: AC
  Administered 2019-07-09: 90 mg via INTRAVENOUS
  Filled 2019-07-09: qty 15

## 2019-07-09 MED ORDER — PALONOSETRON HCL INJECTION 0.25 MG/5ML
INTRAVENOUS | Status: AC
  Filled 2019-07-09: qty 5

## 2019-07-09 MED ORDER — FAMOTIDINE IN NACL 20-0.9 MG/50ML-% IV SOLN
INTRAVENOUS | Status: AC
  Filled 2019-07-09: qty 50

## 2019-07-09 MED ORDER — SODIUM CHLORIDE 0.9 % IV SOLN
Freq: Once | INTRAVENOUS | Status: AC
  Filled 2019-07-09: qty 250

## 2019-07-09 MED ORDER — SODIUM CHLORIDE 0.9 % IV SOLN
189.6000 mg | Freq: Once | INTRAVENOUS | Status: AC
  Administered 2019-07-09: 190 mg via INTRAVENOUS
  Filled 2019-07-09: qty 19

## 2019-07-09 MED ORDER — HEPARIN SOD (PORK) LOCK FLUSH 100 UNIT/ML IV SOLN
500.0000 [IU] | Freq: Once | INTRAVENOUS | Status: DC | PRN
  Filled 2019-07-09: qty 5

## 2019-07-09 MED ORDER — SODIUM CHLORIDE 0.9% FLUSH
10.0000 mL | INTRAVENOUS | Status: DC | PRN
  Filled 2019-07-09: qty 10

## 2019-07-09 MED ORDER — FAMOTIDINE IN NACL 20-0.9 MG/50ML-% IV SOLN
20.0000 mg | Freq: Once | INTRAVENOUS | Status: AC
  Administered 2019-07-09: 20 mg via INTRAVENOUS

## 2019-07-09 MED ORDER — PALONOSETRON HCL INJECTION 0.25 MG/5ML
0.2500 mg | Freq: Once | INTRAVENOUS | Status: AC
  Administered 2019-07-09: 0.25 mg via INTRAVENOUS

## 2019-07-09 MED ORDER — DIPHENHYDRAMINE HCL 50 MG/ML IJ SOLN
50.0000 mg | Freq: Once | INTRAMUSCULAR | Status: AC
  Administered 2019-07-09: 50 mg via INTRAVENOUS

## 2019-07-09 NOTE — Patient Instructions (Signed)
[facility] Discharge Instructions for Patients Receiving Chemotherapy Taxol, Carboplatin. To help prevent nausea and vomiting after your treatment, we encourage you to take your nausea medication.   If you develop nausea and vomiting that is not controlled by your nausea medication, call the clinic.   BELOW ARE SYMPTOMS THAT SHOULD BE REPORTED IMMEDIATELY:  *FEVER GREATER THAN 100.5 F  *CHILLS WITH OR WITHOUT FEVER  NAUSEA AND VOMITING THAT IS NOT CONTROLLED WITH YOUR NAUSEA MEDICATION  *UNUSUAL SHORTNESS OF BREATH  *UNUSUAL BRUISING OR BLEEDING  TENDERNESS IN MOUTH AND THROAT WITH OR WITHOUT PRESENCE OF ULCERS  *URINARY PROBLEMS  *BOWEL PROBLEMS  UNUSUAL RASH Items with * indicate a potential emergency and should be followed up as soon as possible.  Feel free to call the clinic should you have any questions or concerns. The clinic phone number is [phone].  Please show the [facility] at check-in to the Emergency Department and triage nurse.  Paclitaxel injection What is this medicine? PACLITAXEL (PAK li TAX el) is a chemotherapy drug. It targets fast dividing cells, like cancer cells, and causes these cells to die. This medicine is used to treat ovarian cancer, breast cancer, lung cancer, Kaposi's sarcoma, and other cancers. This medicine may be used for other purposes; ask your health care provider or pharmacist if you have questions. COMMON BRAND NAME(S): Onxol, Taxol What should I tell my health care provider before I take this medicine? They need to know if you have any of these conditions:  history of irregular heartbeat  liver disease  low blood counts, like low white cell, platelet, or red cell counts  lung or breathing disease, like asthma  tingling of the fingers or toes, or other nerve disorder  an unusual or allergic reaction to paclitaxel, alcohol, polyoxyethylated castor oil, other chemotherapy, other medicines, foods, dyes, or  preservatives  pregnant or trying to get pregnant  breast-feeding How should I use this medicine? This drug is given as an infusion into a vein. It is administered in a hospital or clinic by a specially trained health care professional. Talk to your pediatrician regarding the use of this medicine in children. Special care may be needed. Overdosage: If you think you have taken too much of this medicine contact a poison control center or emergency room at once. NOTE: This medicine is only for you. Do not share this medicine with others. What if I miss a dose? It is important not to miss your dose. Call your doctor or health care professional if you are unable to keep an appointment. What may interact with this medicine? Do not take this medicine with any of the following medications:  disulfiram  metronidazole This medicine may also interact with the following medications:  antiviral medicines for hepatitis, HIV or AIDS  certain antibiotics like erythromycin and clarithromycin  certain medicines for fungal infections like ketoconazole and itraconazole  certain medicines for seizures like carbamazepine, phenobarbital, phenytoin  gemfibrozil  nefazodone  rifampin  St. John's wort This list may not describe all possible interactions. Give your health care provider a list of all the medicines, herbs, non-prescription drugs, or dietary supplements you use. Also tell them if you smoke, drink alcohol, or use illegal drugs. Some items may interact with your medicine. What should I watch for while using this medicine? Your condition will be monitored carefully while you are receiving this medicine. You will need important blood work done while you are taking this medicine. This medicine can  cause serious allergic reactions. To reduce your risk you will need to take other medicine(s) before treatment with this medicine. If you experience allergic reactions like skin rash, itching or hives,  swelling of the face, lips, or tongue, tell your doctor or health care professional right away. In some cases, you may be given additional medicines to help with side effects. Follow all directions for their use. This drug may make you feel generally unwell. This is not uncommon, as chemotherapy can affect healthy cells as well as cancer cells. Report any side effects. Continue your course of treatment even though you feel ill unless your doctor tells you to stop. Call your doctor or health care professional for advice if you get a fever, chills or sore throat, or other symptoms of a cold or flu. Do not treat yourself. This drug decreases your body's ability to fight infections. Try to avoid being around people who are sick. This medicine may increase your risk to bruise or bleed. Call your doctor or health care professional if you notice any unusual bleeding. Be careful brushing and flossing your teeth or using a toothpick because you may get an infection or bleed more easily. If you have any dental work done, tell your dentist you are receiving this medicine. Avoid taking products that contain aspirin, acetaminophen, ibuprofen, naproxen, or ketoprofen unless instructed by your doctor. These medicines may hide a fever. Do not become pregnant while taking this medicine. Women should inform their doctor if they wish to become pregnant or think they might be pregnant. There is a potential for serious side effects to an unborn child. Talk to your health care professional or pharmacist for more information. Do not breast-feed an infant while taking this medicine. Men are advised not to father a child while receiving this medicine. This product may contain alcohol. Ask your pharmacist or healthcare provider if this medicine contains alcohol. Be sure to tell all healthcare providers you are taking this medicine. Certain medicines, like metronidazole and disulfiram, can cause an unpleasant reaction when taken with  alcohol. The reaction includes flushing, headache, nausea, vomiting, sweating, and increased thirst. The reaction can last from 30 minutes to several hours. What side effects may I notice from receiving this medicine? Side effects that you should report to your doctor or health care professional as soon as possible:  allergic reactions like skin rash, itching or hives, swelling of the face, lips, or tongue  breathing problems  changes in vision  fast, irregular heartbeat  high or low blood pressure  mouth sores  pain, tingling, numbness in the hands or feet  signs of decreased platelets or bleeding - bruising, pinpoint red spots on the skin, black, tarry stools, blood in the urine  signs of decreased red blood cells - unusually weak or tired, feeling faint or lightheaded, falls  signs of infection - fever or chills, cough, sore throat, pain or difficulty passing urine  signs and symptoms of liver injury like dark yellow or brown urine; general ill feeling or flu-like symptoms; light-colored stools; loss of appetite; nausea; right upper belly pain; unusually weak or tired; yellowing of the eyes or skin  swelling of the ankles, feet, hands  unusually slow heartbeat Side effects that usually do not require medical attention (report to your doctor or health care professional if they continue or are bothersome):  diarrhea  hair loss  loss of appetite  muscle or joint pain  nausea, vomiting  pain, redness, or irritation at site where  injected  tiredness This list may not describe all possible side effects. Call your doctor for medical advice about side effects. You may report side effects to FDA at 1-800-FDA-1088. Where should I keep my medicine? This drug is given in a hospital or clinic and will not be stored at home. NOTE: This sheet is a summary. It may not cover all possible information. If you have questions about this medicine, talk to your doctor, pharmacist, or  health care provider.  2020 [employer]/Gold Standard (2016-12-27 13:14:55) Carboplatin injection What is this medicine? CARBOPLATIN (KAR boe pla tin) is a chemotherapy drug. It targets fast dividing cells, like cancer cells, and causes these cells to die. This medicine is used to treat ovarian cancer and many other cancers. This medicine may be used for other purposes; ask your health care provider or pharmacist if you have questions. COMMON BRAND NAME(S): Paraplatin What should I tell my health care provider before I take this medicine? They need to know if you have any of these conditions:  blood disorders  hearing problems  kidney disease  recent or ongoing radiation therapy  an unusual or allergic reaction to carboplatin, cisplatin, other chemotherapy, other medicines, foods, dyes, or preservatives  pregnant or trying to get pregnant  breast-feeding How should I use this medicine? This drug is usually given as an infusion into a vein. It is administered in a hospital or clinic by a specially trained health care professional. Talk to your pediatrician regarding the use of this medicine in children. Special care may be needed. Overdosage: If you think you have taken too much of this medicine contact a poison control center or emergency room at once. NOTE: This medicine is only for you. Do not share this medicine with others. What if I miss a dose? It is important not to miss a dose. Call your doctor or health care professional if you are unable to keep an appointment. What may interact with this medicine?  medicines for seizures  medicines to increase blood counts like filgrastim, pegfilgrastim, sargramostim  some antibiotics like amikacin, gentamicin, neomycin, streptomycin, tobramycin  vaccines Talk to your doctor or health care professional before taking any of these medicines:  acetaminophen  aspirin  ibuprofen  ketoprofen  naproxen This list may not describe  all possible interactions. Give your health care provider a list of all the medicines, herbs, non-prescription drugs, or dietary supplements you use. Also tell them if you smoke, drink alcohol, or use illegal drugs. Some items may interact with your medicine. What should I watch for while using this medicine? Your condition will be monitored carefully while you are receiving this medicine. You will need important blood work done while you are taking this medicine. This drug may make you feel generally unwell. This is not uncommon, as chemotherapy can affect healthy cells as well as cancer cells. Report any side effects. Continue your course of treatment even though you feel ill unless your doctor tells you to stop. In some cases, you may be given additional medicines to help with side effects. Follow all directions for their use. Call your doctor or health care professional for advice if you get a fever, chills or sore throat, or other symptoms of a cold or flu. Do not treat yourself. This drug decreases your body's ability to fight infections. Try to avoid being around people who are sick. This medicine may increase your risk to bruise or bleed. Call your doctor or health care professional if you notice any  unusual bleeding. Be careful brushing and flossing your teeth or using a toothpick because you may get an infection or bleed more easily. If you have any dental work done, tell your dentist you are receiving this medicine. Avoid taking products that contain aspirin, acetaminophen, ibuprofen, naproxen, or ketoprofen unless instructed by your doctor. These medicines may hide a fever. Do not become pregnant while taking this medicine. Women should inform their doctor if they wish to become pregnant or think they might be pregnant. There is a potential for serious side effects to an unborn child. Talk to your health care professional or pharmacist for more information. Do not breast-feed an infant while  taking this medicine. What side effects may I notice from receiving this medicine? Side effects that you should report to your doctor or health care professional as soon as possible:  allergic reactions like skin rash, itching or hives, swelling of the face, lips, or tongue  signs of infection - fever or chills, cough, sore throat, pain or difficulty passing urine  signs of decreased platelets or bleeding - bruising, pinpoint red spots on the skin, black, tarry stools, nosebleeds  signs of decreased red blood cells - unusually weak or tired, fainting spells, lightheadedness  breathing problems  changes in hearing  changes in vision  chest pain  high blood pressure  low blood counts - This drug may decrease the number of white blood cells, red blood cells and platelets. You may be at increased risk for infections and bleeding.  nausea and vomiting  pain, swelling, redness or irritation at the injection site  pain, tingling, numbness in the hands or feet  problems with balance, talking, walking  trouble passing urine or change in the amount of urine Side effects that usually do not require medical attention (report to your doctor or health care professional if they continue or are bothersome):  hair loss  loss of appetite  metallic taste in the mouth or changes in taste This list may not describe all possible side effects. Call your doctor for medical advice about side effects. You may report side effects to FDA at 1-800-FDA-1088. Where should I keep my medicine? This drug is given in a hospital or clinic and will not be stored at home. NOTE: This sheet is a summary. It may not cover all possible information. If you have questions about this medicine, talk to your doctor, pharmacist, or health care provider.  2020 [employer]/Gold Standard (2007-07-31 14:38:05)

## 2019-07-09 NOTE — Progress Notes (Signed)
[place] [phone]   [phone]) [phone]  OFFICE PROGRESS NOTE  Antony Blackbird, MD [place] [address]  DIAGNOSIS: Stage IIIb (T3, M2, M0) non-small cell lung cancer, adenocarcinoma presented with right hilar mass with occlusion of the right middle lobe bronchus centrally with right middle lobe collapse diagnosed in January 2021.   PRIOR THERAPY: None  CURRENT THERAPY: Concurrent chemoradiation with weekly carboplatin for AUC of 2 and paclitaxel 45 mg/M2.  First dose expected on July 09, 2019..  INTERVAL HISTORY: Janice Adams 64 y.o. female returns to the clinic today for follow-up visit accompanied by her son Jori Moll. The patient is feeling fine today with no concerning complaints. She was seen recently by Dr. Roxan Hockey and he recommended for the patient to proceed with neoadjuvant concurrent chemoradiation before reconsideration of surgical resection. This resulted in some delays in starting her treatment and she is here today for evaluation before starting the first cycle of her treatment. She denied having any current chest pain, shortness of breath, cough or hemoptysis. She denied having any fever or chills. She has no nausea, vomiting, diarrhea or constipation. She has no headache or visual changes.   MEDICAL HISTORY: Past Medical History:  Diagnosis Date  . Back pain   . GERD (gastroesophageal reflux disease)   . Gout    no current problems per patient 05/31/19  . History of epigastric pain    comes and goes  . Hypertension    no meds    ALLERGIES:  has No Known Allergies.  MEDICATIONS:  Current Outpatient Medications  Medication Sig Dispense Refill  . naproxen sodium (ALEVE) 220 MG tablet Take 440 mg by mouth 2 (two) times daily as needed (back pain.).    Marland Kitchen omeprazole (PRILOSEC) 20 MG capsule Take 1 capsule (20 mg total) by mouth 2 (two) times daily before a meal. To lower stomach acid 60 capsule 5  .  prochlorperazine (COMPAZINE) 10 MG tablet Take 1 tablet (10 mg total) by mouth every 6 (six) hours as needed for nausea or vomiting. 30 tablet 0  . varenicline (CHANTIX PAK) 0.5 MG X 11 & 1 MG X 42 tablet Take one 0.5 mg tablet by mouth once daily for 3 days, then increase to one 0.5 mg tablet twice daily for 4 days, then increase to one 1 mg tablet twice daily. (Patient not taking: Reported on 07/02/2019) 53 tablet 0   No current facility-administered medications for this visit.    SURGICAL HISTORY:  Past Surgical History:  Procedure Laterality Date  . MULTIPLE TOOTH EXTRACTIONS    . TONSILLECTOMY    . uterine ablation    . VIDEO BRONCHOSCOPY WITH ENDOBRONCHIAL ULTRASOUND N/A 06/04/2019   Procedure: VIDEO BRONCHOSCOPY WITH ENDOBRONCHIAL ULTRASOUND;  Surgeon: Margaretha Seeds, MD;  Location: [facility];  Service: Thoracic;  Laterality: N/A;  . WISDOM TOOTH EXTRACTION      REVIEW OF SYSTEMS:  A comprehensive review of systems was negative.   PHYSICAL EXAMINATION: General appearance: alert, cooperative and no distress Head: Normocephalic, without obvious abnormality, atraumatic Neck: no adenopathy, no JVD, supple, symmetrical, trachea midline and thyroid not enlarged, symmetric, no tenderness/mass/nodules Lymph nodes: Cervical, supraclavicular, and axillary nodes normal. Resp: clear to auscultation bilaterally Back: symmetric, no curvature. ROM normal. No CVA tenderness. Cardio: regular rate and rhythm, S1, S2 normal, no murmur, click, rub or gallop GI: soft, non-tender; bowel sounds normal; no masses,  no organomegaly Extremities: extremities normal, atraumatic, no cyanosis or  edema  ECOG PERFORMANCE STATUS: 1 - Symptomatic but completely ambulatory  Blood pressure 122/88, pulse 91, temperature 98.3 F (36.8 C), temperature source Oral, resp. rate 20, height 5\' 4"  (1.626 m), weight 189 lb 9.6 oz (86 kg), SpO2 100 %.  LABORATORY DATA: Lab Results  Component Value Date   WBC 8.8  06/12/2019   HGB 12.5 06/12/2019   HCT 36.7 06/12/2019   MCV 94.8 06/12/2019   PLT 413 (H) 06/12/2019      Chemistry      Component Value Date/Time   NA 139 06/12/2019 1114   NA 146 (H) 03/06/2018 1128   K 3.2 (L) 06/12/2019 1114   CL 100 06/12/2019 1114   CO2 27 06/12/2019 1114   BUN 11 06/12/2019 1114   BUN 8 03/06/2018 1128   CREATININE 1.09 (H) 06/12/2019 1114   CREATININE 0.85 05/11/2016 1157      Component Value Date/Time   CALCIUM 9.7 06/12/2019 1114   ALKPHOS 98 06/12/2019 1114   AST 9 (L) 06/12/2019 1114   ALT 8 06/12/2019 1114   BILITOT 1.0 06/12/2019 1114       RADIOGRAPHIC STUDIES: MR BRAIN W WO CONTRAST  Result Date: 06/28/2019 CLINICAL DATA:  Lung cancer, staging EXAM: MRI HEAD WITHOUT AND WITH CONTRAST TECHNIQUE: Multiplanar, multiecho pulse sequences of the brain and surrounding structures were obtained without and with intravenous contrast. CONTRAST:  61mL GADAVIST GADOBUTROL 1 MMOL/ML IV SOLN COMPARISON:  None. FINDINGS: Brain: There is no acute infarction or intracranial hemorrhage. There is no intracranial mass, mass effect, or edema. There is no hydrocephalus or extra-axial fluid collection. Patchy and confluent areas of T2 hyperintensity in the supratentorial white matter are nonspecific but may reflect mild to moderate chronic microvascular ischemic changes. Punctate focus of susceptibility in the left frontal subcortical white matter is compatible with chronic microhemorrhage or mineralization. No abnormal enhancement. Vascular: Major vessel flow voids at the skull base are preserved. Skull and upper cervical spine: Abnormal marrow signal at the opposing endplates of C3 and C4 is probably on a degenerative basis. There may be significant canal stenosis at this level. Sinuses/Orbits: Paranasal sinuses are aerated. Orbits are unremarkable. Other: Sella is unremarkable.  Mastoid air cells are clear. IMPRESSION: No evidence of intracranial metastatic disease.  Abnormal marrow signal at the opposing C3-C4 endplates is likely on a degenerative basis. There may be significant canal stenosis at this level. Electronically Signed   By: Macy Mis M.D.   On: 06/28/2019 09:05   NM PET Image Initial (PI) Skull Base To Thigh  Result Date: 06/11/2019 CLINICAL DATA:  Initial treatment strategy for right lung mass. EXAM: NUCLEAR MEDICINE PET SKULL BASE TO THIGH TECHNIQUE: 9.6 mCi F-18 FDG was injected intravenously. Full-ring PET imaging was performed from the skull base to thigh after the radiotracer. CT data was obtained and used for attenuation correction and anatomic localization. Fasting blood glucose: 106 mg/dl COMPARISON:  Chest CT on 04/26/2019 FINDINGS: Mediastinal blood-pool activity (background): SUV max = 2.8 Liver activity (reference): SUV max = N/A NECK: Significant image degradation due to patient motion. No definite hypermetabolic masses identified within the neck. Incidental CT findings:  None. CHEST: Hypermetabolic mass is seen in the right hilar region. Adjacent right middle lobe collapse is seen showing intense hypermetabolic activity, and therefore the centrally obstructing mass cannot be measured. SUV max obtained in the central right middle lobe/hilar region measures 15.2. Mild hypermetabolic activity is seen within sub-cm right paratracheal lymph node, with SUV max measuring 3.2. No  other suspicious pulmonary nodules identified on CT images. Mild centrilobular emphysema noted. No evidence of pleural effusion. Incidental CT findings:  None. ABDOMEN/PELVIS: No abnormal hypermetabolic activity within the liver, pancreas, adrenal glands, or spleen. No hypermetabolic lymph nodes in the abdomen or pelvis. Incidental CT findings: Small calcified uterine fibroids noted. Tiny hiatal hernia. Aortic atherosclerosis also noted. SKELETON: No focal hypermetabolic bone lesions to suggest skeletal metastasis. Incidental CT findings:  None. IMPRESSION: Hypermetabolic  right hilar mass with postobstructive collapse of right middle lobe, consistent with primary bronchogenic carcinoma. Sub-cm hypermetabolic right paratracheal lymph node, suspicious for lymph node metastases. No evidence of distant metastatic disease. Electronically Signed   By: Marlaine Hind M.D.   On: 06/11/2019 19:25    ASSESSMENT AND PLAN: This is a very pleasant 64 years old African-American female diagnosed with stage IIIb (T3, N2, M0) non-small cell lung cancer, adenocarcinoma presented with right hilar mass with occlusion of the right middle lobe bronchus as well as right middle lobe collapse in addition to right lower lobe pulmonary nodule diagnosed in January 2021. The patient is here today for evaluation before starting the first cycle of her concurrent chemoradiation with weekly carboplatin and paclitaxel. She is feeling fine today with no concerning complaints. I recommended for her to proceed with the neoadjuvant course of this treatment for the next 5 weeks. We will reevaluate her for surgical resection after the first 5 weeks of her treatment. I will see her back for follow-up visit in 2 weeks for evaluation and management of any adverse effect of her treatment before starting cycle #3. She was advised to call immediately if she has any concerning symptoms in the interval. The patient voices understanding of current disease status and treatment options and is in agreement with the current care plan.  All questions were answered. The patient knows to call the clinic with any problems, questions or concerns. We can certainly see the patient much sooner if necessary.  Disclaimer: This note was dictated with voice recognition software. Similar sounding words can inadvertently be transcribed and may not be corrected upon review.

## 2019-07-09 NOTE — Progress Notes (Signed)
First time taxol [place] completed without pt complaint. All questions answered. Nutrition given. AVS given.

## 2019-07-09 NOTE — Patient Instructions (Signed)
Steps to Quit Smoking Smoking tobacco is the leading cause of preventable death. It can affect almost every organ in the body. Smoking puts you and people around you at risk for many serious, long-lasting (chronic) diseases. Quitting smoking can be hard, but it is one of the best things that you can do for your health. It is never too late to quit. How do I get ready to quit? When you decide to quit smoking, make a plan to help you succeed. Before you quit:  Pick a date to quit. Set a date within the next 2 weeks to give you time to prepare.  Write down the reasons why you are quitting. Keep this list in places where you will see it often.  Tell your family, friends, and co-workers that you are quitting. Their support is important.  Talk with your doctor about the choices that may help you quit.  Find out if your health insurance will pay for these treatments.  Know the people, places, things, and activities that make you want to smoke (triggers). Avoid them. What first steps can I take to quit smoking?  Throw away all cigarettes at home, at work, and in your car.  Throw away the things that you use when you smoke, such as ashtrays and lighters.  Clean your car. Make sure to empty the ashtray.  Clean your home, including curtains and carpets. What can I do to help me quit smoking? Talk with your doctor about taking medicines and seeing a counselor at the same time. You are more likely to succeed when you do both.  If you are pregnant or breastfeeding, talk with your doctor about counseling or other ways to quit smoking. Do not take medicine to help you quit smoking unless your doctor tells you to do so. To quit smoking: Quit right away  Quit smoking totally, instead of slowly cutting back on how much you smoke over a period of time.  Go to counseling. You are more likely to quit if you go to counseling sessions regularly. Take medicine You may take medicines to help you quit. Some  medicines need a prescription, and some you can buy over-the-counter. Some medicines may contain a drug called nicotine to replace the nicotine in cigarettes. Medicines may:  Help you to stop having the desire to smoke (cravings).  Help to stop the problems that come when you stop smoking (withdrawal symptoms). Your doctor may ask you to use:  Nicotine patches, gum, or lozenges.  Nicotine inhalers or sprays.  Non-nicotine medicine that is taken by mouth. Find resources Find resources and other ways to help you quit smoking and remain smoke-free after you quit. These resources are most helpful when you use them often. They include:  Online chats with a counselor.  Phone quitlines.  Printed self-help materials.  Support groups or group counseling.  Text messaging programs.  Mobile phone apps. Use apps on your mobile phone or tablet that can help you stick to your quit plan. There are many free apps for mobile phones and tablets as well as websites. Examples include Quit Guide from the CDC and smokefree.gov  What things can I do to make it easier to quit?   Talk to your family and friends. Ask them to support and encourage you.  Call a phone quitline (1-800-QUIT-NOW), reach out to support groups, or work with a counselor.  Ask people who smoke to not smoke around you.  Avoid places that make you want to smoke,   such as: ? Bars. ? Parties. ? Smoke-break areas at work.  Spend time with people who do not smoke.  Lower the stress in your life. Stress can make you want to smoke. Try these things to help your stress: ? Getting regular exercise. ? Doing deep-breathing exercises. ? Doing yoga. ? Meditating. ? Doing a body scan. To do this, close your eyes, focus on one area of your body at a time from head to toe. Notice which parts of your body are tense. Try to relax the muscles in those areas. How will I feel when I quit smoking? Day 1 to 3 weeks Within the first 24 hours,  you may start to have some problems that come from quitting tobacco. These problems are very bad 2-3 days after you quit, but they do not often last for more than 2-3 weeks. You may get these symptoms:  Mood swings.  Feeling restless, nervous, angry, or annoyed.  Trouble concentrating.  Dizziness.  Strong desire for high-sugar foods and nicotine.  Weight gain.  Trouble pooping (constipation).  Feeling like you may vomit (nausea).  Coughing or a sore throat.  Changes in how the medicines that you take for other issues work in your body.  Depression.  Trouble sleeping (insomnia). Week 3 and afterward After the first 2-3 weeks of quitting, you may start to notice more positive results, such as:  Better sense of smell and taste.  Less coughing and sore throat.  Slower heart rate.  Lower blood pressure.  Clearer skin.  Better breathing.  Fewer sick days. Quitting smoking can be hard. Do not give up if you fail the first time. Some people need to try a few times before they succeed. Do your best to stick to your quit plan, and talk with your doctor if you have any questions or concerns. Summary  Smoking tobacco is the leading cause of preventable death. Quitting smoking can be hard, but it is one of the best things that you can do for your health.  When you decide to quit smoking, make a plan to help you succeed.  Quit smoking right away, not slowly over a period of time.  When you start quitting, seek help from your doctor, family, or friends. This information is not intended to replace advice given to you by your health care provider. Make sure you discuss any questions you have with your health care provider. Document Revised: 01/18/2019 Document Reviewed: 07/14/2018 Elsevier Patient Education  2020 [employer]vier Inc.  

## 2019-07-10 ENCOUNTER — Telehealth: Payer: Self-pay | Admitting: Internal Medicine

## 2019-07-10 NOTE — Telephone Encounter (Signed)
Scheduled per los. Called and spoke with patient. Informed of change in appts from tuesdays to [place]. Confirmed all appt

## 2019-07-10 NOTE — Telephone Encounter (Signed)
Left message with granddaughter for patient to call back to schedule appointment w/Dr. Loanne Drilling.  ta -

## 2019-07-12 NOTE — Progress Notes (Signed)
Pharmacist Chemotherapy Monitoring - Follow Up Assessment    I verify that I have reviewed each item in the below checklist:  Regimen:  . Patient due for treatment based on regimen frequency. . Plan date matches the patient's scheduled date. . Premedications are appropriate for the patient's regimen. . Assessed if patient has a supportive care therapy plans, and if patient is due for treatment.  Organ Function and Labs: . Appropriate labs and drug-specific monitoring are ordered prior to the patient's treatment.  Dose Assessment: . If applicable to patient's regimen, lifetime cumulative doses have been properly documented and assessed.  Order Review: . Treatment plan orders signed and/or patient is scheduled to see a provider prior to their treatment.  Plan for follow-up and/or issues identified: . No . I-vent associated with next due treatment: No . MD and/or nursing notified: No  Delane Ginger 07/12/2019 1:41 PM

## 2019-07-15 ENCOUNTER — Telehealth: Payer: Self-pay | Admitting: Medical Oncology

## 2019-07-15 ENCOUNTER — Inpatient Hospital Stay: Payer: Medicaid Other

## 2019-07-15 NOTE — Telephone Encounter (Signed)
I called pt and confirmed her appts for tomorrow.

## 2019-07-15 NOTE — Telephone Encounter (Signed)
I called pt and she read to me her appt calender with  appts on March 9th , not today.

## 2019-07-16 ENCOUNTER — Telehealth: Payer: Self-pay | Admitting: Medical Oncology

## 2019-07-16 ENCOUNTER — Other Ambulatory Visit: Payer: Self-pay

## 2019-07-16 ENCOUNTER — Inpatient Hospital Stay: Payer: Medicaid Other

## 2019-07-16 ENCOUNTER — Ambulatory Visit: Payer: Medicaid Other

## 2019-07-16 ENCOUNTER — Other Ambulatory Visit: Payer: Medicaid Other

## 2019-07-16 VITALS — BP 123/84 | HR 95 | Temp 98.9°F | Resp 17 | Wt 189.0 lb

## 2019-07-16 DIAGNOSIS — C3491 Malignant neoplasm of unspecified part of right bronchus or lung: Secondary | ICD-10-CM

## 2019-07-16 DIAGNOSIS — Z5111 Encounter for antineoplastic chemotherapy: Secondary | ICD-10-CM | POA: Diagnosis not present

## 2019-07-16 LAB — CBC WITH DIFFERENTIAL (CANCER CENTER ONLY)
Abs Immature Granulocytes: 0.01 10*3/uL (ref 0.00–0.07)
Basophils Absolute: 0 10*3/uL (ref 0.0–0.1)
Basophils Relative: 1 %
Eosinophils Absolute: 0.1 10*3/uL (ref 0.0–0.5)
Eosinophils Relative: 2 %
HCT: 38.8 % (ref 36.0–46.0)
Hemoglobin: 13 g/dL (ref 12.0–15.0)
Immature Granulocytes: 0 %
Lymphocytes Relative: 46 %
Lymphs Abs: 2 10*3/uL (ref 0.7–4.0)
MCH: 32.2 pg (ref 26.0–34.0)
MCHC: 33.5 g/dL (ref 30.0–36.0)
MCV: 96 fL (ref 80.0–100.0)
Monocytes Absolute: 0.4 10*3/uL (ref 0.1–1.0)
Monocytes Relative: 8 %
Neutro Abs: 1.9 10*3/uL (ref 1.7–7.7)
Neutrophils Relative %: 43 %
Platelet Count: 339 10*3/uL (ref 150–400)
RBC: 4.04 MIL/uL (ref 3.87–5.11)
RDW: 13.1 % (ref 11.5–15.5)
WBC Count: 4.3 10*3/uL (ref 4.0–10.5)
nRBC: 0 % (ref 0.0–0.2)

## 2019-07-16 LAB — CMP (CANCER CENTER ONLY)
ALT: 8 U/L (ref 0–44)
AST: 13 U/L — ABNORMAL LOW (ref 15–41)
Albumin: 4 g/dL (ref 3.5–5.0)
Alkaline Phosphatase: 100 U/L (ref 38–126)
Anion gap: 8 (ref 5–15)
BUN: 15 mg/dL (ref 8–23)
CO2: 25 mmol/L (ref 22–32)
Calcium: 9.5 mg/dL (ref 8.9–10.3)
Chloride: 105 mmol/L (ref 98–111)
Creatinine: 0.81 mg/dL (ref 0.44–1.00)
GFR, Est AFR Am: >60 mL/min (ref >60)
GFR, Estimated: >60 mL/min (ref >60)
Glucose, Bld: 97 mg/dL (ref 70–99)
Potassium: 4.3 mmol/L (ref 3.5–5.1)
Sodium: 138 mmol/L (ref 135–145)
Total Bilirubin: 0.5 mg/dL (ref 0.3–1.2)
Total Protein: 7.9 g/dL (ref 6.5–8.1)

## 2019-07-16 MED ORDER — FAMOTIDINE IN NACL 20-0.9 MG/50ML-% IV SOLN
INTRAVENOUS | Status: AC
  Filled 2019-07-16: qty 50

## 2019-07-16 MED ORDER — SODIUM CHLORIDE 0.9 % IV SOLN
200.0000 mg | Freq: Once | INTRAVENOUS | Status: AC
  Administered 2019-07-16: 16:00:00 200 mg via INTRAVENOUS
  Filled 2019-07-16: qty 20

## 2019-07-16 MED ORDER — SODIUM CHLORIDE 0.9 % IV SOLN
20.0000 mg | Freq: Once | INTRAVENOUS | Status: AC
  Administered 2019-07-16: 20 mg via INTRAVENOUS
  Filled 2019-07-16: qty 20

## 2019-07-16 MED ORDER — PALONOSETRON HCL INJECTION 0.25 MG/5ML
0.2500 mg | Freq: Once | INTRAVENOUS | Status: AC
  Administered 2019-07-16: 0.25 mg via INTRAVENOUS

## 2019-07-16 MED ORDER — FAMOTIDINE IN NACL 20-0.9 MG/50ML-% IV SOLN
20.0000 mg | Freq: Once | INTRAVENOUS | Status: AC
  Administered 2019-07-16: 20 mg via INTRAVENOUS

## 2019-07-16 MED ORDER — SODIUM CHLORIDE 0.9 % IV SOLN
Freq: Once | INTRAVENOUS | Status: AC
  Filled 2019-07-16: qty 250

## 2019-07-16 MED ORDER — DIPHENHYDRAMINE HCL 50 MG/ML IJ SOLN
INTRAMUSCULAR | Status: AC
  Filled 2019-07-16: qty 1

## 2019-07-16 MED ORDER — PALONOSETRON HCL INJECTION 0.25 MG/5ML
INTRAVENOUS | Status: AC
  Filled 2019-07-16: qty 5

## 2019-07-16 MED ORDER — SODIUM CHLORIDE 0.9 % IV SOLN
45.0000 mg/m2 | Freq: Once | INTRAVENOUS | Status: AC
  Administered 2019-07-16: 90 mg via INTRAVENOUS
  Filled 2019-07-16: qty 15

## 2019-07-16 MED ORDER — DIPHENHYDRAMINE HCL 50 MG/ML IJ SOLN
50.0000 mg | Freq: Once | INTRAMUSCULAR | Status: AC
  Administered 2019-07-16: 50 mg via INTRAVENOUS

## 2019-07-16 NOTE — Patient Instructions (Signed)
[facility] Discharge Instructions for Patients Receiving Chemotherapy  Today you received the following chemotherapy agents Taxol, Carboplatin  To help prevent nausea and vomiting after your treatment, we encourage you to take your nausea medication as directed  If you develop nausea and vomiting that is not controlled by your nausea medication, call the clinic.   BELOW ARE SYMPTOMS THAT SHOULD BE REPORTED IMMEDIATELY:  *FEVER GREATER THAN 100.5 F  *CHILLS WITH OR WITHOUT FEVER  NAUSEA AND VOMITING THAT IS NOT CONTROLLED WITH YOUR NAUSEA MEDICATION  *UNUSUAL SHORTNESS OF BREATH  *UNUSUAL BRUISING OR BLEEDING  TENDERNESS IN MOUTH AND THROAT WITH OR WITHOUT PRESENCE OF ULCERS  *URINARY PROBLEMS  *BOWEL PROBLEMS  UNUSUAL RASH Items with * indicate a potential emergency and should be followed up as soon as possible.  Feel free to call the clinic should you have any questions or concerns. The clinic phone number is [phone].  Please show the CHEMO ALERT CARD at check-in to the Emergency Department and triage nurse.   

## 2019-07-16 NOTE — Telephone Encounter (Signed)
Pt here

## 2019-07-18 LAB — ACID FAST CULTURE WITH REFLEXED SENSITIVITIES (MYCOBACTERIA): Acid Fast Culture: NEGATIVE

## 2019-07-21 NOTE — Progress Notes (Signed)
Janice OFFICE PROGRESS NOTE  Janice Blackbird, MD [place] [address]  DIAGNOSIS: Stage IIIb (T3, M2, M0) non-small cell lung cancer, adenocarcinoma presented with right hilar mass with occlusion of the right middle lobe bronchus centrally with right middle lobe collapse diagnosed in January 2021.   PRIOR THERAPY: None  CURRENT THERAPY: Concurrent chemoradiation with weekly carboplatin for AUC of 2 and paclitaxel 45 mg/M2.  First dose expected on July 09, 2019. Status post 2 cycles.   INTERVAL HISTORY: Janice Adams 64 y.o. female returns to the clinic for a follow up visit. The patient is feeling well today without any concerning complaints. She has been trying to stay active and go on multiple walks. The patient continues to tolerate treatment with weekly chemoradiation well without any adverse effects except for mild queasiness which resolves with her anti-emetic. She has not started radiation yet but will hopefully start this week. Denies any fever, chills, night sweats, or weight loss. Denies any chest pain, shortness of breath, cough, or hemoptysis. Denies any vomiting, diarrhea, or constipation. Denies any headache or visual changes.  The patient is here today for evaluation prior to starting cycle # 3.   MEDICAL HISTORY: Past Medical History:  Diagnosis Date  . Back pain   . GERD (gastroesophageal reflux disease)   . Gout    no current problems per patient 05/31/19  . History of epigastric pain    comes and goes  . Hypertension    no meds    ALLERGIES:  has No Known Allergies.  MEDICATIONS:  Current Outpatient Medications  Medication Sig Dispense Refill  . naproxen sodium (ALEVE) 220 MG tablet Take 440 mg by mouth 2 (two) times daily as needed (back pain.).    Marland Kitchen omeprazole (PRILOSEC) 20 MG capsule Take 1 capsule (20 mg total) by mouth 2 (two) times daily before a meal. To lower stomach acid 60 capsule 5  . prochlorperazine (COMPAZINE)  10 MG tablet Take 1 tablet (10 mg total) by mouth every 6 (six) hours as needed for nausea or vomiting. 30 tablet 0  . varenicline (CHANTIX PAK) 0.5 MG X 11 & 1 MG X 42 tablet Take one 0.5 mg tablet by mouth once daily for 3 days, then increase to one 0.5 mg tablet twice daily for 4 days, then increase to one 1 mg tablet twice daily. (Patient not taking: Reported on 07/02/2019) 53 tablet 0   No current facility-administered medications for this visit.   Facility-Administered Medications Ordered in Other Visits  Medication Dose Route Frequency Provider Last Rate Last Admin  . 0.9 %  sodium chloride infusion   Intravenous Once Curt Bears, MD      . CARBOplatin (PARAPLATIN) 200 mg in sodium chloride 0.9 % 100 mL chemo infusion  200 mg Intravenous Once Curt Bears, MD      . dexamethasone (DECADRON) 20 mg in sodium chloride 0.9 % 50 mL IVPB  20 mg Intravenous Once Curt Bears, MD      . diphenhydrAMINE (BENADRYL) injection 50 mg  50 mg Intravenous Once Curt Bears, MD      . famotidine (PEPCID) IVPB 20 mg premix  20 mg Intravenous Once Curt Bears, MD      . heparin lock flush 100 unit/mL  500 Units Intracatheter Once PRN Curt Bears, MD      . PACLitaxel (TAXOL) 90 mg in sodium chloride 0.9 % 250 mL chemo infusion (</= 80mg /m2)  45 mg/m2 (Treatment Plan Recorded) Intravenous  Once Curt Bears, MD      . palonosetron Leona Carry) injection 0.25 mg  0.25 mg Intravenous Once Curt Bears, MD      . sodium chloride flush (NS) 0.9 % injection 10 mL  10 mL Intracatheter PRN Curt Bears, MD        SURGICAL HISTORY:  Past Surgical History:  Procedure Laterality Date  . MULTIPLE TOOTH EXTRACTIONS    . TONSILLECTOMY    . uterine ablation    . VIDEO BRONCHOSCOPY WITH ENDOBRONCHIAL ULTRASOUND N/A 06/04/2019   Procedure: VIDEO BRONCHOSCOPY WITH ENDOBRONCHIAL ULTRASOUND;  Surgeon: Margaretha Seeds, MD;  Location: [facility];  Service: Thoracic;  Laterality: N/A;  . WISDOM  TOOTH EXTRACTION      REVIEW OF SYSTEMS:   Review of Systems  Constitutional: Negative for appetite change, chills, fatigue, fever and unexpected weight change.  HENT: Negative for mouth sores, nosebleeds, sore throat and trouble swallowing.   Eyes: Negative for eye problems and icterus.  Respiratory: Negative for cough, hemoptysis, shortness of breath and wheezing.   Cardiovascular: Negative for chest pain and leg swelling.  Gastrointestinal: Positive for mild nausea. Negative for abdominal pain, constipation, diarrhea, and vomiting.  Genitourinary: Negative for bladder incontinence, difficulty urinating, dysuria, frequency and hematuria.   Musculoskeletal: Negative for back pain, gait problem, neck pain and neck stiffness.  Skin: Negative for itching and rash.  Neurological: Negative for dizziness, extremity weakness, gait problem, headaches, light-headedness and seizures.  Hematological: Negative for adenopathy. Does not bruise/bleed easily.  Psychiatric/Behavioral: Negative for confusion, depression and sleep disturbance. The patient is not nervous/anxious.     PHYSICAL EXAMINATION:  Blood pressure (!) 147/92, pulse 86, temperature 98.6 F (37 C), temperature source Oral, resp. rate 20, height 5\' 4"  (1.626 m), weight 192 lb 8 oz (87.3 kg), SpO2 100 %.  ECOG PERFORMANCE STATUS: 1 - Symptomatic but completely ambulatory  Physical Exam  Constitutional: Oriented to person, place, and time and well-developed, well-nourished, and in no distress. HENT:  Head: Normocephalic and atraumatic.  Mouth/Throat: Oropharynx is clear and moist. No oropharyngeal exudate.  Eyes: Conjunctivae are normal. Right eye exhibits no discharge. Left eye exhibits no discharge. No scleral icterus.  Neck: Normal range of motion. Neck supple.  Cardiovascular: Normal rate, regular rhythm, normal heart sounds and intact distal pulses.   Pulmonary/Chest: Effort normal and breath sounds normal. No respiratory  distress. No wheezes. No rales.  Abdominal: Soft. Bowel sounds are normal. Exhibits no distension and no mass. There is no tenderness.  Musculoskeletal: Normal range of motion. Exhibits no edema.  Lymphadenopathy:    No cervical adenopathy.  Neurological: Alert and oriented to person, place, and time. Exhibits normal muscle tone. Gait normal. Coordination normal.  Skin: Skin is warm and dry. No rash noted. Not diaphoretic. No erythema. No pallor.  Psychiatric: Mood, memory and judgment normal.  Vitals reviewed.  LABORATORY DATA: Lab Results  Component Value Date   WBC 3.9 (L) 07/22/2019   HGB 12.8 07/22/2019   HCT 37.7 07/22/2019   MCV 96.2 07/22/2019   PLT 348 07/22/2019      Chemistry      Component Value Date/Time   NA 138 07/22/2019 1122   NA 146 (H) 03/06/2018 1128   K 4.7 07/22/2019 1122   CL 102 07/22/2019 1122   CO2 25 07/22/2019 1122   BUN 15 07/22/2019 1122   BUN 8 03/06/2018 1128   CREATININE 0.85 07/22/2019 1122   CREATININE 0.85 05/11/2016 1157      Component Value Date/Time  CALCIUM 9.8 07/22/2019 1122   ALKPHOS 102 07/22/2019 1122   AST 16 07/22/2019 1122   ALT 10 07/22/2019 1122   BILITOT 0.5 07/22/2019 1122       RADIOGRAPHIC STUDIES:  MR BRAIN W WO CONTRAST  Result Date: 06/28/2019 CLINICAL DATA:  Lung cancer, staging EXAM: MRI HEAD WITHOUT AND WITH CONTRAST TECHNIQUE: Multiplanar, multiecho pulse sequences of the brain and surrounding structures were obtained without and with intravenous contrast. CONTRAST:  76mL GADAVIST GADOBUTROL 1 MMOL/ML IV SOLN COMPARISON:  None. FINDINGS: Brain: There is no acute infarction or intracranial hemorrhage. There is no intracranial mass, mass effect, or edema. There is no hydrocephalus or extra-axial fluid collection. Patchy and confluent areas of T2 hyperintensity in the supratentorial white matter are nonspecific but may reflect mild to moderate chronic microvascular ischemic changes. Punctate focus of  susceptibility in the left frontal subcortical white matter is compatible with chronic microhemorrhage or mineralization. No abnormal enhancement. Vascular: Major vessel flow voids at the skull base are preserved. Skull and upper cervical spine: Abnormal marrow signal at the opposing endplates of C3 and C4 is probably on a degenerative basis. There may be significant canal stenosis at this level. Sinuses/Orbits: Paranasal sinuses are aerated. Orbits are unremarkable. Other: Sella is unremarkable.  Mastoid air cells are clear. IMPRESSION: No evidence of intracranial metastatic disease. Abnormal marrow signal at the opposing C3-C4 endplates is likely on a degenerative basis. There may be significant canal stenosis at this level. Electronically Signed   By: Macy Mis M.D.   On: 06/28/2019 09:05     ASSESSMENT/PLAN:  This is a very pleasant 64 year old African American female diagnosed with stage IIIb (T3, N2, M0) non-small cell lung cancer, adenocarcinoma presented with a right hilar mass with occlusion of the right middle lobe bronchus as well as right middle lobe collapse. She also has a right lower lobe pulmonary nodule. She was diagnosed in January 2021.   She is currently undergoing neoadjuvant weekly concurrent chemoradiation with carboplatin for an AUC of 2 and Paclitaxel 45 mg/m2. She is status post 2 cycles.    Labs were reviewed. Recommend that she proceed with cycle #3 today as scheduled.   We will see her back for a follow up visit in 2 weeks for evaluation before starting cycle #5.   Discussed patient's appointments with radiation oncology. They will arrange for her CT simulation ~tomorrow. The patient is aware to be on the lookout for a phone call for an appointment time.   The patient was advised to call immediately if she has any concerning symptoms in the interval. The patient voices understanding of current disease status and treatment options and is in agreement with the current  care plan. All questions were answered. The patient knows to call the clinic with any problems, questions or concerns. We can certainly see the patient much sooner if necessary     No orders of the defined types were placed in this encounter.    Norma Ignasiak L Estuardo Frisbee, PA-C 07/22/19

## 2019-07-22 ENCOUNTER — Inpatient Hospital Stay (HOSPITAL_BASED_OUTPATIENT_CLINIC_OR_DEPARTMENT_OTHER): Payer: Medicaid Other | Admitting: Physician Assistant

## 2019-07-22 ENCOUNTER — Inpatient Hospital Stay: Payer: Medicaid Other

## 2019-07-22 ENCOUNTER — Ambulatory Visit: Payer: Medicaid Other | Admitting: Radiation Oncology

## 2019-07-22 ENCOUNTER — Encounter: Payer: Self-pay | Admitting: Physician Assistant

## 2019-07-22 ENCOUNTER — Other Ambulatory Visit: Payer: Self-pay

## 2019-07-22 VITALS — BP 147/92 | HR 86 | Temp 98.6°F | Resp 20 | Ht 64.0 in | Wt 192.5 lb

## 2019-07-22 DIAGNOSIS — Z5111 Encounter for antineoplastic chemotherapy: Secondary | ICD-10-CM | POA: Diagnosis not present

## 2019-07-22 DIAGNOSIS — C3491 Malignant neoplasm of unspecified part of right bronchus or lung: Secondary | ICD-10-CM

## 2019-07-22 LAB — CBC WITH DIFFERENTIAL (CANCER CENTER ONLY)
Abs Immature Granulocytes: 0.01 10*3/uL (ref 0.00–0.07)
Basophils Absolute: 0 10*3/uL (ref 0.0–0.1)
Basophils Relative: 0 %
Eosinophils Absolute: 0 10*3/uL (ref 0.0–0.5)
Eosinophils Relative: 1 %
HCT: 37.7 % (ref 36.0–46.0)
Hemoglobin: 12.8 g/dL (ref 12.0–15.0)
Immature Granulocytes: 0 %
Lymphocytes Relative: 40 %
Lymphs Abs: 1.6 10*3/uL (ref 0.7–4.0)
MCH: 32.7 pg (ref 26.0–34.0)
MCHC: 34 g/dL (ref 30.0–36.0)
MCV: 96.2 fL (ref 80.0–100.0)
Monocytes Absolute: 0.3 10*3/uL (ref 0.1–1.0)
Monocytes Relative: 6 %
Neutro Abs: 2.1 10*3/uL (ref 1.7–7.7)
Neutrophils Relative %: 53 %
Platelet Count: 348 10*3/uL (ref 150–400)
RBC: 3.92 MIL/uL (ref 3.87–5.11)
RDW: 13.2 % (ref 11.5–15.5)
WBC Count: 3.9 10*3/uL — ABNORMAL LOW (ref 4.0–10.5)
nRBC: 0 % (ref 0.0–0.2)

## 2019-07-22 LAB — CMP (CANCER CENTER ONLY)
ALT: 10 U/L (ref 0–44)
AST: 16 U/L (ref 15–41)
Albumin: 3.9 g/dL (ref 3.5–5.0)
Alkaline Phosphatase: 102 U/L (ref 38–126)
Anion gap: 11 (ref 5–15)
BUN: 15 mg/dL (ref 8–23)
CO2: 25 mmol/L (ref 22–32)
Calcium: 9.8 mg/dL (ref 8.9–10.3)
Chloride: 102 mmol/L (ref 98–111)
Creatinine: 0.85 mg/dL (ref 0.44–1.00)
GFR, Est AFR Am: >60 mL/min (ref >60)
GFR, Estimated: >60 mL/min (ref >60)
Glucose, Bld: 99 mg/dL (ref 70–99)
Potassium: 4.7 mmol/L (ref 3.5–5.1)
Sodium: 138 mmol/L (ref 135–145)
Total Bilirubin: 0.5 mg/dL (ref 0.3–1.2)
Total Protein: 7.7 g/dL (ref 6.5–8.1)

## 2019-07-22 MED ORDER — FAMOTIDINE IN NACL 20-0.9 MG/50ML-% IV SOLN
INTRAVENOUS | Status: AC
  Filled 2019-07-22: qty 50

## 2019-07-22 MED ORDER — DIPHENHYDRAMINE HCL 50 MG/ML IJ SOLN
50.0000 mg | Freq: Once | INTRAMUSCULAR | Status: AC
  Administered 2019-07-22: 50 mg via INTRAVENOUS

## 2019-07-22 MED ORDER — SODIUM CHLORIDE 0.9 % IV SOLN
45.0000 mg/m2 | Freq: Once | INTRAVENOUS | Status: AC
  Administered 2019-07-22: 90 mg via INTRAVENOUS
  Filled 2019-07-22: qty 15

## 2019-07-22 MED ORDER — SODIUM CHLORIDE 0.9% FLUSH
10.0000 mL | INTRAVENOUS | Status: DC | PRN
  Filled 2019-07-22: qty 10

## 2019-07-22 MED ORDER — SODIUM CHLORIDE 0.9 % IV SOLN
Freq: Once | INTRAVENOUS | Status: AC
  Filled 2019-07-22: qty 250

## 2019-07-22 MED ORDER — SODIUM CHLORIDE 0.9 % IV SOLN
200.0000 mg | Freq: Once | INTRAVENOUS | Status: AC
  Administered 2019-07-22: 200 mg via INTRAVENOUS
  Filled 2019-07-22: qty 20

## 2019-07-22 MED ORDER — FAMOTIDINE IN NACL 20-0.9 MG/50ML-% IV SOLN
20.0000 mg | Freq: Once | INTRAVENOUS | Status: AC
  Administered 2019-07-22: 20 mg via INTRAVENOUS

## 2019-07-22 MED ORDER — HEPARIN SOD (PORK) LOCK FLUSH 100 UNIT/ML IV SOLN
500.0000 [IU] | Freq: Once | INTRAVENOUS | Status: DC | PRN
  Filled 2019-07-22: qty 5

## 2019-07-22 MED ORDER — PALONOSETRON HCL INJECTION 0.25 MG/5ML
0.2500 mg | Freq: Once | INTRAVENOUS | Status: AC
  Administered 2019-07-22: 0.25 mg via INTRAVENOUS

## 2019-07-22 MED ORDER — DIPHENHYDRAMINE HCL 50 MG/ML IJ SOLN
INTRAMUSCULAR | Status: AC
  Filled 2019-07-22: qty 1

## 2019-07-22 MED ORDER — PALONOSETRON HCL INJECTION 0.25 MG/5ML
INTRAVENOUS | Status: AC
  Filled 2019-07-22: qty 5

## 2019-07-22 MED ORDER — SODIUM CHLORIDE 0.9 % IV SOLN
20.0000 mg | Freq: Once | INTRAVENOUS | Status: AC
  Administered 2019-07-22: 20 mg via INTRAVENOUS
  Filled 2019-07-22: qty 20

## 2019-07-22 NOTE — Patient Instructions (Signed)
[facility] Discharge Instructions for Patients Receiving Chemotherapy  Today you received the following chemotherapy agents Taxol, Carboplatin  To help prevent nausea and vomiting after your treatment, we encourage you to take your nausea medication as directed  If you develop nausea and vomiting that is not controlled by your nausea medication, call the clinic.   BELOW ARE SYMPTOMS THAT SHOULD BE REPORTED IMMEDIATELY:  *FEVER GREATER THAN 100.5 F  *CHILLS WITH OR WITHOUT FEVER  NAUSEA AND VOMITING THAT IS NOT CONTROLLED WITH YOUR NAUSEA MEDICATION  *UNUSUAL SHORTNESS OF BREATH  *UNUSUAL BRUISING OR BLEEDING  TENDERNESS IN MOUTH AND THROAT WITH OR WITHOUT PRESENCE OF ULCERS  *URINARY PROBLEMS  *BOWEL PROBLEMS  UNUSUAL RASH Items with * indicate a potential emergency and should be followed up as soon as possible.  Feel free to call the clinic should you have any questions or concerns. The clinic phone number is [phone].  Please show the CHEMO ALERT CARD at check-in to the Emergency Department and triage nurse.   

## 2019-07-23 ENCOUNTER — Ambulatory Visit: Payer: Medicaid Other

## 2019-07-23 ENCOUNTER — Ambulatory Visit: Payer: Medicaid Other | Admitting: Internal Medicine

## 2019-07-23 ENCOUNTER — Other Ambulatory Visit: Payer: Medicaid Other

## 2019-07-23 ENCOUNTER — Other Ambulatory Visit: Payer: Self-pay

## 2019-07-23 ENCOUNTER — Ambulatory Visit
Admission: RE | Admit: 2019-07-23 | Discharge: 2019-07-23 | Disposition: A | Discharge status: Home / Self Care | Payer: Medicaid Other | Source: Ambulatory Visit | Attending: Radiation Oncology | Admitting: Radiation Oncology

## 2019-07-23 DIAGNOSIS — Z510 Encounter for antineoplastic radiation therapy: Secondary | ICD-10-CM | POA: Diagnosis not present

## 2019-07-23 DIAGNOSIS — C342 Malignant neoplasm of middle lobe, bronchus or lung: Secondary | ICD-10-CM | POA: Insufficient documentation

## 2019-07-24 ENCOUNTER — Telehealth: Payer: Self-pay | Admitting: Physician Assistant

## 2019-07-24 DIAGNOSIS — Z510 Encounter for antineoplastic radiation therapy: Secondary | ICD-10-CM | POA: Diagnosis not present

## 2019-07-24 NOTE — Telephone Encounter (Signed)
Scheduled per los. Called, no answer, no voicemail. Mailed printout

## 2019-07-25 ENCOUNTER — Ambulatory Visit
Admission: RE | Admit: 2019-07-25 | Discharge: 2019-07-25 | Disposition: A | Discharge status: Home / Self Care | Payer: Medicaid Other | Source: Ambulatory Visit | Attending: Radiation Oncology | Admitting: Radiation Oncology

## 2019-07-25 ENCOUNTER — Other Ambulatory Visit: Payer: Self-pay

## 2019-07-25 DIAGNOSIS — Z510 Encounter for antineoplastic radiation therapy: Secondary | ICD-10-CM | POA: Diagnosis not present

## 2019-07-26 ENCOUNTER — Other Ambulatory Visit: Payer: Self-pay

## 2019-07-26 ENCOUNTER — Ambulatory Visit
Admission: RE | Admit: 2019-07-26 | Discharge: 2019-07-26 | Disposition: A | Discharge status: Home / Self Care | Payer: Medicaid Other | Source: Ambulatory Visit | Attending: Radiation Oncology | Admitting: Radiation Oncology

## 2019-07-26 DIAGNOSIS — C342 Malignant neoplasm of middle lobe, bronchus or lung: Secondary | ICD-10-CM

## 2019-07-26 DIAGNOSIS — Z510 Encounter for antineoplastic radiation therapy: Secondary | ICD-10-CM | POA: Diagnosis not present

## 2019-07-26 MED ORDER — SONAFINE EX EMUL
1.0000 "application " | Freq: Once | CUTANEOUS | Status: AC
  Administered 2019-07-26: 1 via TOPICAL

## 2019-07-29 ENCOUNTER — Other Ambulatory Visit: Payer: Self-pay

## 2019-07-29 ENCOUNTER — Inpatient Hospital Stay: Payer: Medicaid Other

## 2019-07-29 ENCOUNTER — Other Ambulatory Visit: Payer: Self-pay | Admitting: Medical Oncology

## 2019-07-29 ENCOUNTER — Ambulatory Visit
Admission: RE | Admit: 2019-07-29 | Discharge: 2019-07-29 | Disposition: A | Discharge status: Home / Self Care | Payer: Medicaid Other | Source: Ambulatory Visit | Attending: Radiation Oncology | Admitting: Radiation Oncology

## 2019-07-29 VITALS — BP 119/81 | HR 96 | Temp 98.0°F | Resp 18 | Wt 195.0 lb

## 2019-07-29 DIAGNOSIS — C3491 Malignant neoplasm of unspecified part of right bronchus or lung: Secondary | ICD-10-CM

## 2019-07-29 DIAGNOSIS — Z510 Encounter for antineoplastic radiation therapy: Secondary | ICD-10-CM | POA: Diagnosis not present

## 2019-07-29 DIAGNOSIS — Z5111 Encounter for antineoplastic chemotherapy: Secondary | ICD-10-CM

## 2019-07-29 DIAGNOSIS — C342 Malignant neoplasm of middle lobe, bronchus or lung: Secondary | ICD-10-CM

## 2019-07-29 LAB — CMP (CANCER CENTER ONLY)
ALT: 12 U/L (ref 0–44)
AST: 13 U/L — ABNORMAL LOW (ref 15–41)
Albumin: 3.8 g/dL (ref 3.5–5.0)
Alkaline Phosphatase: 111 U/L (ref 38–126)
Anion gap: 9 (ref 5–15)
BUN: 17 mg/dL (ref 8–23)
CO2: 24 mmol/L (ref 22–32)
Calcium: 9.5 mg/dL (ref 8.9–10.3)
Chloride: 104 mmol/L (ref 98–111)
Creatinine: 0.89 mg/dL (ref 0.44–1.00)
GFR, Est AFR Am: >60 mL/min (ref >60)
GFR, Estimated: >60 mL/min (ref >60)
Glucose, Bld: 119 mg/dL — ABNORMAL HIGH (ref 70–99)
Potassium: 4.6 mmol/L (ref 3.5–5.1)
Sodium: 137 mmol/L (ref 135–145)
Total Bilirubin: 0.4 mg/dL (ref 0.3–1.2)
Total Protein: 7.5 g/dL (ref 6.5–8.1)

## 2019-07-29 LAB — CBC WITH DIFFERENTIAL (CANCER CENTER ONLY)
Abs Immature Granulocytes: 0.01 10*3/uL (ref 0.00–0.07)
Basophils Absolute: 0 10*3/uL (ref 0.0–0.1)
Basophils Relative: 1 %
Eosinophils Absolute: 0 10*3/uL (ref 0.0–0.5)
Eosinophils Relative: 0 %
HCT: 37.1 % (ref 36.0–46.0)
Hemoglobin: 12.4 g/dL (ref 12.0–15.0)
Immature Granulocytes: 0 %
Lymphocytes Relative: 41 %
Lymphs Abs: 1.2 10*3/uL (ref 0.7–4.0)
MCH: 32.6 pg (ref 26.0–34.0)
MCHC: 33.4 g/dL (ref 30.0–36.0)
MCV: 97.6 fL (ref 80.0–100.0)
Monocytes Absolute: 0.2 10*3/uL (ref 0.1–1.0)
Monocytes Relative: 8 %
Neutro Abs: 1.5 10*3/uL — ABNORMAL LOW (ref 1.7–7.7)
Neutrophils Relative %: 50 %
Platelet Count: 300 10*3/uL (ref 150–400)
RBC: 3.8 MIL/uL — ABNORMAL LOW (ref 3.87–5.11)
RDW: 13.3 % (ref 11.5–15.5)
WBC Count: 3.1 10*3/uL — ABNORMAL LOW (ref 4.0–10.5)
nRBC: 0 % (ref 0.0–0.2)

## 2019-07-29 MED ORDER — PALONOSETRON HCL INJECTION 0.25 MG/5ML
0.2500 mg | Freq: Once | INTRAVENOUS | Status: AC
  Administered 2019-07-29: 11:00:00 0.25 mg via INTRAVENOUS

## 2019-07-29 MED ORDER — PROCHLORPERAZINE MALEATE 10 MG PO TABS: 10.0000 mg | ORAL_TABLET | Freq: Four times a day (QID) | ORAL | 1 refills | Status: DC | PRN

## 2019-07-29 MED ORDER — SODIUM CHLORIDE 0.9 % IV SOLN
20.0000 mg | Freq: Once | INTRAVENOUS | Status: AC
  Administered 2019-07-29: 12:00:00 20 mg via INTRAVENOUS
  Filled 2019-07-29: qty 20

## 2019-07-29 MED ORDER — DIPHENHYDRAMINE HCL 50 MG/ML IJ SOLN
50.0000 mg | Freq: Once | INTRAMUSCULAR | Status: AC
  Administered 2019-07-29: 50 mg via INTRAVENOUS

## 2019-07-29 MED ORDER — FAMOTIDINE IN NACL 20-0.9 MG/50ML-% IV SOLN
20.0000 mg | Freq: Once | INTRAVENOUS | Status: AC
  Administered 2019-07-29: 12:00:00 20 mg via INTRAVENOUS

## 2019-07-29 MED ORDER — SODIUM CHLORIDE 0.9 % IV SOLN
45.0000 mg/m2 | Freq: Once | INTRAVENOUS | Status: AC
  Administered 2019-07-29: 13:00:00 90 mg via INTRAVENOUS
  Filled 2019-07-29: qty 15

## 2019-07-29 MED ORDER — FAMOTIDINE IN NACL 20-0.9 MG/50ML-% IV SOLN
INTRAVENOUS | Status: AC
  Filled 2019-07-29: qty 50

## 2019-07-29 MED ORDER — SODIUM CHLORIDE 0.9 % IV SOLN
200.0000 mg | Freq: Once | INTRAVENOUS | Status: AC
  Administered 2019-07-29: 200 mg via INTRAVENOUS
  Filled 2019-07-29: qty 20

## 2019-07-29 MED ORDER — PALONOSETRON HCL INJECTION 0.25 MG/5ML
INTRAVENOUS | Status: AC
  Filled 2019-07-29: qty 5

## 2019-07-29 MED ORDER — DIPHENHYDRAMINE HCL 50 MG/ML IJ SOLN
INTRAMUSCULAR | Status: AC
  Filled 2019-07-29: qty 1

## 2019-07-29 MED ORDER — SODIUM CHLORIDE 0.9 % IV SOLN
Freq: Once | INTRAVENOUS | Status: AC
  Filled 2019-07-29: qty 250

## 2019-07-29 NOTE — Patient Instructions (Signed)
[facility] Discharge Instructions for Patients Receiving Chemotherapy  Today you received the following chemotherapy agents Taxol, Carboplatin  To help prevent nausea and vomiting after your treatment, we encourage you to take your nausea medication as directed  If you develop nausea and vomiting that is not controlled by your nausea medication, call the clinic.   BELOW ARE SYMPTOMS THAT SHOULD BE REPORTED IMMEDIATELY:  *FEVER GREATER THAN 100.5 F  *CHILLS WITH OR WITHOUT FEVER  NAUSEA AND VOMITING THAT IS NOT CONTROLLED WITH YOUR NAUSEA MEDICATION  *UNUSUAL SHORTNESS OF BREATH  *UNUSUAL BRUISING OR BLEEDING  TENDERNESS IN MOUTH AND THROAT WITH OR WITHOUT PRESENCE OF ULCERS  *URINARY PROBLEMS  *BOWEL PROBLEMS  UNUSUAL RASH Items with * indicate a potential emergency and should be followed up as soon as possible.  Feel free to call the clinic should you have any questions or concerns. The clinic phone number is [phone].  Please show the CHEMO ALERT CARD at check-in to the Emergency Department and triage nurse.   

## 2019-07-30 ENCOUNTER — Ambulatory Visit
Admission: RE | Admit: 2019-07-30 | Discharge: 2019-07-30 | Disposition: A | Discharge status: Home / Self Care | Payer: Medicaid Other | Source: Ambulatory Visit | Attending: Radiation Oncology | Admitting: Radiation Oncology

## 2019-07-30 ENCOUNTER — Other Ambulatory Visit: Payer: Self-pay

## 2019-07-30 DIAGNOSIS — Z510 Encounter for antineoplastic radiation therapy: Secondary | ICD-10-CM | POA: Diagnosis not present

## 2019-07-30 NOTE — Progress Notes (Signed)
Pharmacist Chemotherapy Monitoring - Follow Up Assessment    I verify that I have reviewed each item in the below checklist:  . Regimen for the patient is scheduled for the appropriate day and plan matches scheduled date. Marland Kitchen Appropriate non-routine labs are ordered dependent on drug ordered. . If applicable, additional medications reviewed and ordered per protocol based on lifetime cumulative doses and/or treatment regimen.   Plan for follow-up and/or issues identified: No . I-vent associated with next due treatment: No . MD and/or nursing notified: No  Philomena Course 07/30/2019 9:15 AM

## 2019-07-31 ENCOUNTER — Other Ambulatory Visit: Payer: Self-pay

## 2019-07-31 ENCOUNTER — Ambulatory Visit
Admission: RE | Admit: 2019-07-31 | Discharge: 2019-07-31 | Disposition: A | Discharge status: Home / Self Care | Payer: Medicaid Other | Source: Ambulatory Visit | Attending: Radiation Oncology | Admitting: Radiation Oncology

## 2019-07-31 DIAGNOSIS — Z510 Encounter for antineoplastic radiation therapy: Secondary | ICD-10-CM | POA: Diagnosis not present

## 2019-08-01 ENCOUNTER — Ambulatory Visit
Admission: RE | Admit: 2019-08-01 | Discharge: 2019-08-01 | Disposition: A | Discharge status: Home / Self Care | Payer: Medicaid Other | Source: Ambulatory Visit | Attending: Radiation Oncology | Admitting: Radiation Oncology

## 2019-08-01 ENCOUNTER — Other Ambulatory Visit: Payer: Self-pay

## 2019-08-01 DIAGNOSIS — Z510 Encounter for antineoplastic radiation therapy: Secondary | ICD-10-CM | POA: Diagnosis not present

## 2019-08-02 ENCOUNTER — Other Ambulatory Visit: Payer: Self-pay

## 2019-08-02 ENCOUNTER — Ambulatory Visit
Admission: RE | Admit: 2019-08-02 | Discharge: 2019-08-02 | Disposition: A | Discharge status: Home / Self Care | Payer: Medicaid Other | Source: Ambulatory Visit | Attending: Radiation Oncology | Admitting: Radiation Oncology

## 2019-08-02 DIAGNOSIS — Z510 Encounter for antineoplastic radiation therapy: Secondary | ICD-10-CM | POA: Diagnosis not present

## 2019-08-05 ENCOUNTER — Ambulatory Visit
Admission: RE | Admit: 2019-08-05 | Discharge: 2019-08-05 | Disposition: A | Discharge status: Home / Self Care | Payer: Medicaid Other | Source: Ambulatory Visit | Attending: Radiation Oncology | Admitting: Radiation Oncology

## 2019-08-05 ENCOUNTER — Other Ambulatory Visit: Payer: Self-pay

## 2019-08-05 ENCOUNTER — Encounter: Payer: Self-pay | Admitting: Internal Medicine

## 2019-08-05 ENCOUNTER — Inpatient Hospital Stay (HOSPITAL_BASED_OUTPATIENT_CLINIC_OR_DEPARTMENT_OTHER): Payer: Medicaid Other | Admitting: Internal Medicine

## 2019-08-05 ENCOUNTER — Inpatient Hospital Stay: Payer: Medicaid Other

## 2019-08-05 VITALS — BP 146/88 | HR 103 | Temp 98.1°F | Resp 20 | Ht 64.0 in | Wt 195.8 lb

## 2019-08-05 VITALS — HR 99

## 2019-08-05 DIAGNOSIS — C342 Malignant neoplasm of middle lobe, bronchus or lung: Secondary | ICD-10-CM

## 2019-08-05 DIAGNOSIS — Z510 Encounter for antineoplastic radiation therapy: Secondary | ICD-10-CM | POA: Diagnosis not present

## 2019-08-05 DIAGNOSIS — Z5111 Encounter for antineoplastic chemotherapy: Secondary | ICD-10-CM | POA: Diagnosis not present

## 2019-08-05 DIAGNOSIS — C3491 Malignant neoplasm of unspecified part of right bronchus or lung: Secondary | ICD-10-CM

## 2019-08-05 LAB — CBC WITH DIFFERENTIAL (CANCER CENTER ONLY)
Abs Immature Granulocytes: 0.01 10*3/uL (ref 0.00–0.07)
Basophils Absolute: 0 10*3/uL (ref 0.0–0.1)
Basophils Relative: 0 %
Eosinophils Absolute: 0 10*3/uL (ref 0.0–0.5)
Eosinophils Relative: 0 %
HCT: 36.4 % (ref 36.0–46.0)
Hemoglobin: 12 g/dL (ref 12.0–15.0)
Immature Granulocytes: 0 %
Lymphocytes Relative: 37 %
Lymphs Abs: 1.1 10*3/uL (ref 0.7–4.0)
MCH: 31.9 pg (ref 26.0–34.0)
MCHC: 33 g/dL (ref 30.0–36.0)
MCV: 96.8 fL (ref 80.0–100.0)
Monocytes Absolute: 0.2 10*3/uL (ref 0.1–1.0)
Monocytes Relative: 7 %
Neutro Abs: 1.7 10*3/uL (ref 1.7–7.7)
Neutrophils Relative %: 56 %
Platelet Count: 215 10*3/uL (ref 150–400)
RBC: 3.76 MIL/uL — ABNORMAL LOW (ref 3.87–5.11)
RDW: 14 % (ref 11.5–15.5)
WBC Count: 3 10*3/uL — ABNORMAL LOW (ref 4.0–10.5)
nRBC: 0 % (ref 0.0–0.2)

## 2019-08-05 LAB — CMP (CANCER CENTER ONLY)
ALT: 12 U/L (ref 0–44)
AST: 13 U/L — ABNORMAL LOW (ref 15–41)
Albumin: 3.7 g/dL (ref 3.5–5.0)
Alkaline Phosphatase: 95 U/L (ref 38–126)
Anion gap: 8 (ref 5–15)
BUN: 13 mg/dL (ref 8–23)
CO2: 24 mmol/L (ref 22–32)
Calcium: 9.7 mg/dL (ref 8.9–10.3)
Chloride: 106 mmol/L (ref 98–111)
Creatinine: 0.85 mg/dL (ref 0.44–1.00)
GFR, Est AFR Am: >60 mL/min (ref >60)
GFR, Estimated: >60 mL/min (ref >60)
Glucose, Bld: 125 mg/dL — ABNORMAL HIGH (ref 70–99)
Potassium: 4.3 mmol/L (ref 3.5–5.1)
Sodium: 138 mmol/L (ref 135–145)
Total Bilirubin: 0.6 mg/dL (ref 0.3–1.2)
Total Protein: 7.5 g/dL (ref 6.5–8.1)

## 2019-08-05 MED ORDER — FAMOTIDINE IN NACL 20-0.9 MG/50ML-% IV SOLN
20.0000 mg | Freq: Once | INTRAVENOUS | Status: AC
  Administered 2019-08-05: 13:00:00 20 mg via INTRAVENOUS

## 2019-08-05 MED ORDER — SODIUM CHLORIDE 0.9 % IV SOLN
45.0000 mg/m2 | Freq: Once | INTRAVENOUS | Status: AC
  Administered 2019-08-05: 14:00:00 90 mg via INTRAVENOUS
  Filled 2019-08-05: qty 15

## 2019-08-05 MED ORDER — SODIUM CHLORIDE 0.9 % IV SOLN
200.0000 mg | Freq: Once | INTRAVENOUS | Status: AC
  Administered 2019-08-05: 200 mg via INTRAVENOUS
  Filled 2019-08-05: qty 20

## 2019-08-05 MED ORDER — SODIUM CHLORIDE 0.9 % IV SOLN
Freq: Once | INTRAVENOUS | Status: AC
  Filled 2019-08-05: qty 250

## 2019-08-05 MED ORDER — SODIUM CHLORIDE 0.9 % IV SOLN: 200.0000 mg | Freq: Once | INTRAVENOUS | Status: DC

## 2019-08-05 MED ORDER — DIPHENHYDRAMINE HCL 50 MG/ML IJ SOLN
INTRAMUSCULAR | Status: AC
  Filled 2019-08-05: qty 1

## 2019-08-05 MED ORDER — FAMOTIDINE IN NACL 20-0.9 MG/50ML-% IV SOLN
INTRAVENOUS | Status: AC
  Filled 2019-08-05: qty 50

## 2019-08-05 MED ORDER — PALONOSETRON HCL INJECTION 0.25 MG/5ML
0.2500 mg | Freq: Once | INTRAVENOUS | Status: AC
  Administered 2019-08-05: 0.25 mg via INTRAVENOUS

## 2019-08-05 MED ORDER — PALONOSETRON HCL INJECTION 0.25 MG/5ML
INTRAVENOUS | Status: AC
  Filled 2019-08-05: qty 5

## 2019-08-05 MED ORDER — SODIUM CHLORIDE 0.9 % IV SOLN
20.0000 mg | Freq: Once | INTRAVENOUS | Status: AC
  Administered 2019-08-05: 13:00:00 20 mg via INTRAVENOUS
  Filled 2019-08-05: qty 20

## 2019-08-05 MED ORDER — DIPHENHYDRAMINE HCL 50 MG/ML IJ SOLN
50.0000 mg | Freq: Once | INTRAMUSCULAR | Status: AC
  Administered 2019-08-05: 50 mg via INTRAVENOUS

## 2019-08-05 NOTE — Patient Instructions (Signed)
[facility] Discharge Instructions for Patients Receiving Chemotherapy  Today you received the following chemotherapy agents Taxol, Carboplatin  To help prevent nausea and vomiting after your treatment, we encourage you to take your nausea medication as directed  If you develop nausea and vomiting that is not controlled by your nausea medication, call the clinic.   BELOW ARE SYMPTOMS THAT SHOULD BE REPORTED IMMEDIATELY:  *FEVER GREATER THAN 100.5 F  *CHILLS WITH OR WITHOUT FEVER  NAUSEA AND VOMITING THAT IS NOT CONTROLLED WITH YOUR NAUSEA MEDICATION  *UNUSUAL SHORTNESS OF BREATH  *UNUSUAL BRUISING OR BLEEDING  TENDERNESS IN MOUTH AND THROAT WITH OR WITHOUT PRESENCE OF ULCERS  *URINARY PROBLEMS  *BOWEL PROBLEMS  UNUSUAL RASH Items with * indicate a potential emergency and should be followed up as soon as possible.  Feel free to call the clinic should you have any questions or concerns. The clinic phone number is [phone].  Please show the CHEMO ALERT CARD at check-in to the Emergency Department and triage nurse.   

## 2019-08-05 NOTE — Progress Notes (Signed)
[facility] [phone]   [phone]  OFFICE PROGRESS NOTE  Antony Blackbird, MD [place] [address]  DIAGNOSIS: Stage IIIb (T3, M2, M0) non-small cell lung cancer, adenocarcinoma presented with right hilar mass with occlusion of the right middle lobe bronchus centrally with right middle lobe collapse diagnosed in January 2021.   PRIOR THERAPY: None  CURRENT THERAPY: Concurrent chemoradiation with weekly carboplatin for AUC of 2 and paclitaxel 45 mg/M2.  First dose expected on July 09, 2019.  Status post 4 cycles.  INTERVAL HISTORY: Janice Adams 64 y.o. female returns to the clinic today for follow-up visit.  The patient is feeling fine today with no concerning complaints except for mild fatigue.  She has been tolerating her treatment with concurrent chemoradiation fairly well except for mild odynophagia.  She denied having any current chest pain, shortness of breath, cough or hemoptysis.  She denied having any fever or chills.  She has no nausea, vomiting, diarrhea or constipation.  She denied having any headache or visual changes.  She is here today for evaluation before starting cycle #5 of her treatment.  MEDICAL HISTORY: Past Medical History:  Diagnosis Date  . Back pain   . GERD (gastroesophageal reflux disease)   . Gout    no current problems per patient 05/31/19  . History of epigastric pain    comes and goes  . Hypertension    no meds    ALLERGIES:  has No Known Allergies.  MEDICATIONS:  Current Outpatient Medications  Medication Sig Dispense Refill  . naproxen sodium (ALEVE) 220 MG tablet Take 440 mg by mouth 2 (two) times daily as needed (back pain.).    Marland Kitchen omeprazole (PRILOSEC) 20 MG capsule Take 1 capsule (20 mg total) by mouth 2 (two) times daily before a meal. To lower stomach acid 60 capsule 5  . prochlorperazine (COMPAZINE) 10 MG tablet Take 1 tablet (10 mg total) by mouth every 6 (six) hours as needed  for nausea or vomiting. 30 tablet 1  . varenicline (CHANTIX PAK) 0.5 MG X 11 & 1 MG X 42 tablet Take one 0.5 mg tablet by mouth once daily for 3 days, then increase to one 0.5 mg tablet twice daily for 4 days, then increase to one 1 mg tablet twice daily. 53 tablet 0   No current facility-administered medications for this visit.    SURGICAL HISTORY:  Past Surgical History:  Procedure Laterality Date  . MULTIPLE TOOTH EXTRACTIONS    . TONSILLECTOMY    . uterine ablation    . VIDEO BRONCHOSCOPY WITH ENDOBRONCHIAL ULTRASOUND N/A 06/04/2019   Procedure: VIDEO BRONCHOSCOPY WITH ENDOBRONCHIAL ULTRASOUND;  Surgeon: Margaretha Seeds, MD;  Location: [facility];  Service: Thoracic;  Laterality: N/A;  . WISDOM TOOTH EXTRACTION      REVIEW OF SYSTEMS:  A comprehensive review of systems was negative except for: Constitutional: positive for fatigue Gastrointestinal: positive for odynophagia   PHYSICAL EXAMINATION: General appearance: alert, cooperative and no distress Head: Normocephalic, without obvious abnormality, atraumatic Neck: no adenopathy, no JVD, supple, symmetrical, trachea midline and thyroid not enlarged, symmetric, no tenderness/mass/nodules Lymph nodes: Cervical, supraclavicular, and axillary nodes normal. Resp: clear to auscultation bilaterally Back: symmetric, no curvature. ROM normal. No CVA tenderness. Cardio: regular rate and rhythm, S1, S2 normal, no murmur, click, rub or gallop GI: soft, non-tender; bowel sounds normal; no masses,  no organomegaly Extremities: extremities normal, atraumatic, no cyanosis or edema  ECOG PERFORMANCE STATUS:  1 - Symptomatic but completely ambulatory  Blood pressure (!) 146/88, pulse (!) 103, temperature 98.1 F (36.7 C), temperature source Oral, resp. rate 20, height 5\' 4"  (1.626 m), weight 195 lb 12.8 oz (88.8 kg), SpO2 100 %.  LABORATORY DATA: Lab Results  Component Value Date   WBC 3.0 (L) 08/05/2019   HGB 12.0 08/05/2019   HCT 36.4  08/05/2019   MCV 96.8 08/05/2019   PLT 215 08/05/2019      Chemistry      Component Value Date/Time   NA 137 07/29/2019 1009   NA 146 (H) 03/06/2018 1128   K 4.6 07/29/2019 1009   CL 104 07/29/2019 1009   CO2 24 07/29/2019 1009   BUN 17 07/29/2019 1009   BUN 8 03/06/2018 1128   CREATININE 0.89 07/29/2019 1009   CREATININE 0.85 05/11/2016 1157      Component Value Date/Time   CALCIUM 9.5 07/29/2019 1009   ALKPHOS 111 07/29/2019 1009   AST 13 (L) 07/29/2019 1009   ALT 12 07/29/2019 1009   BILITOT 0.4 07/29/2019 1009       RADIOGRAPHIC STUDIES: No results found.  ASSESSMENT AND PLAN: This is a very pleasant 64 years old African-American female diagnosed with stage IIIb (T3, N2, M0) non-small cell lung cancer, adenocarcinoma presented with right hilar mass with occlusion of the right middle lobe bronchus as well as right middle lobe collapse in addition to right lower lobe pulmonary nodule diagnosed in January 2021. She is currently undergoing a course of concurrent chemoradiation with weekly carboplatin and paclitaxel status post 4 cycles.  The patient has been tolerating her treatment well with no concerning adverse effects. I recommended for her to proceed with cycle #5 today as planned. For the mild odynophagia, she will discuss with Dr. Lisbeth Renshaw to adjust the radiotherapy treatment and also she may need to start taking Carafate or other pain medication for the odynophagia if it gets worse with the radiotherapy. I will see her back for follow-up visit in 2 weeks for evaluation before starting cycle #7. The patient was advised to call immediately if she has any concerning symptoms in the interval. The patient voices understanding of current disease status and treatment options and is in agreement with the current care plan.  All questions were answered. The patient knows to call the clinic with any problems, questions or concerns. We can certainly see the patient much sooner if  necessary.  Disclaimer: This note was dictated with voice recognition software. Similar sounding words can inadvertently be transcribed and may not be corrected upon review.

## 2019-08-06 ENCOUNTER — Other Ambulatory Visit: Payer: Self-pay

## 2019-08-06 ENCOUNTER — Ambulatory Visit
Admission: RE | Admit: 2019-08-06 | Discharge: 2019-08-06 | Disposition: A | Discharge status: Home / Self Care | Payer: Medicaid Other | Source: Ambulatory Visit | Attending: Radiation Oncology | Admitting: Radiation Oncology

## 2019-08-06 ENCOUNTER — Telehealth: Payer: Self-pay | Admitting: Internal Medicine

## 2019-08-06 DIAGNOSIS — Z510 Encounter for antineoplastic radiation therapy: Secondary | ICD-10-CM | POA: Diagnosis not present

## 2019-08-06 NOTE — Telephone Encounter (Signed)
Scheduled per los. Called and left msg. Mailed printout  °

## 2019-08-06 NOTE — Progress Notes (Signed)
Pharmacist Chemotherapy Monitoring - Follow Up Assessment    I verify that I have reviewed each item in the below checklist:  . Regimen for the patient is scheduled for the appropriate day and plan matches scheduled date. Marland Kitchen Appropriate non-routine labs are ordered dependent on drug ordered. . If applicable, additional medications reviewed and ordered per protocol based on lifetime cumulative doses and/or treatment regimen.   Plan for follow-up and/or issues identified: Yes . I-vent associated with next due treatment: Yes . MD and/or nursing notified: No   Kennith Center, Pharm.D., CPP 08/06/2019@1 :02 PM

## 2019-08-07 ENCOUNTER — Other Ambulatory Visit: Payer: Self-pay | Admitting: Family Medicine

## 2019-08-07 ENCOUNTER — Other Ambulatory Visit: Payer: Self-pay

## 2019-08-07 ENCOUNTER — Ambulatory Visit: Payer: Medicaid Other | Attending: Family Medicine

## 2019-08-07 ENCOUNTER — Ambulatory Visit
Admission: RE | Admit: 2019-08-07 | Discharge: 2019-08-07 | Disposition: A | Discharge status: Home / Self Care | Payer: Medicaid Other | Source: Ambulatory Visit | Attending: Radiation Oncology | Admitting: Radiation Oncology

## 2019-08-07 ENCOUNTER — Telehealth: Payer: Self-pay

## 2019-08-07 VITALS — Temp 98.1°F

## 2019-08-07 DIAGNOSIS — N390 Urinary tract infection, site not specified: Secondary | ICD-10-CM | POA: Diagnosis not present

## 2019-08-07 DIAGNOSIS — R300 Dysuria: Secondary | ICD-10-CM | POA: Diagnosis not present

## 2019-08-07 DIAGNOSIS — Z510 Encounter for antineoplastic radiation therapy: Secondary | ICD-10-CM | POA: Diagnosis not present

## 2019-08-07 DIAGNOSIS — N3000 Acute cystitis without hematuria: Secondary | ICD-10-CM

## 2019-08-07 LAB — POCT URINALYSIS DIP (CLINITEK)
Bilirubin, UA: NEGATIVE
Glucose, UA: NEGATIVE mg/dL
Ketones, POC UA: NEGATIVE mg/dL
Nitrite, UA: NEGATIVE
POC PROTEIN,UA: NEGATIVE
Spec Grav, UA: 1.01
Urobilinogen, UA: 0.2 U/dL
pH, UA: 5.5

## 2019-08-07 MED ORDER — CEPHALEXIN 500 MG PO CAPS: 500.0000 mg | ORAL_CAPSULE | Freq: Two times a day (BID) | ORAL | 0 refills | Status: AC

## 2019-08-07 NOTE — Progress Notes (Signed)
Pt here instructed  by MD Fulp for Urine dip POC  Pt  stated experiencing burning on urination, urinary discomfort and urgency since yesterday .Denies fever .  Stated she knows she has an UTI.   Clintex test done today / results in epic / made Md aware  Urine sent to lab/ made Helene Kelp aware

## 2019-08-07 NOTE — Telephone Encounter (Signed)
I will place a order for urinalysis and urine culture.

## 2019-08-07 NOTE — Progress Notes (Signed)
Patient with abnormal UA. Culture pending and RX sent to pharmacy for keflex in the meantime

## 2019-08-07 NOTE — Telephone Encounter (Signed)
Pt called stated experiencing burning on urination, urinary discomfort and urgency since yesterday .Denies fever .  Stated she knows she has an UTI. Would like if possible to send a her a prescription to the Pharmacy. Stated she is has a treatament today at the cancer center.  Please advice!

## 2019-08-07 NOTE — Telephone Encounter (Signed)
Pt was here today / urine was collected and sent to the lab/ MD aware

## 2019-08-07 NOTE — Progress Notes (Signed)
Patient ID: Janice Adams, female   DOB: 09/29/1955, 64 y.o.   MRN: [id number]   Patient called and requested to leave sample for UA due to dysuria and suspected UTI. She is currently in radiation therapy at this time.

## 2019-08-08 ENCOUNTER — Other Ambulatory Visit: Payer: Self-pay

## 2019-08-08 ENCOUNTER — Ambulatory Visit
Admission: RE | Admit: 2019-08-08 | Discharge: 2019-08-08 | Disposition: A | Discharge status: Home / Self Care | Payer: Medicaid Other | Source: Ambulatory Visit | Attending: Radiation Oncology | Admitting: Radiation Oncology

## 2019-08-08 ENCOUNTER — Ambulatory Visit: Payer: Medicaid Other

## 2019-08-08 DIAGNOSIS — Z510 Encounter for antineoplastic radiation therapy: Secondary | ICD-10-CM | POA: Diagnosis present

## 2019-08-08 DIAGNOSIS — C3491 Malignant neoplasm of unspecified part of right bronchus or lung: Secondary | ICD-10-CM | POA: Insufficient documentation

## 2019-08-08 DIAGNOSIS — R300 Dysuria: Secondary | ICD-10-CM | POA: Insufficient documentation

## 2019-08-08 DIAGNOSIS — C342 Malignant neoplasm of middle lobe, bronchus or lung: Secondary | ICD-10-CM | POA: Insufficient documentation

## 2019-08-09 ENCOUNTER — Ambulatory Visit
Admission: RE | Admit: 2019-08-09 | Discharge: 2019-08-09 | Disposition: A | Discharge status: Home / Self Care | Payer: Medicaid Other | Source: Ambulatory Visit | Attending: Radiation Oncology | Admitting: Radiation Oncology

## 2019-08-09 ENCOUNTER — Other Ambulatory Visit: Payer: Self-pay

## 2019-08-09 DIAGNOSIS — C3491 Malignant neoplasm of unspecified part of right bronchus or lung: Secondary | ICD-10-CM | POA: Diagnosis not present

## 2019-08-09 LAB — URINE CULTURE

## 2019-08-12 ENCOUNTER — Inpatient Hospital Stay: Payer: Medicaid Other | Attending: Internal Medicine

## 2019-08-12 ENCOUNTER — Ambulatory Visit: Payer: Medicaid Other | Admitting: Pulmonary Disease

## 2019-08-12 ENCOUNTER — Other Ambulatory Visit: Payer: Self-pay

## 2019-08-12 ENCOUNTER — Telehealth: Payer: Self-pay | Admitting: Pulmonary Disease

## 2019-08-12 ENCOUNTER — Ambulatory Visit
Admission: RE | Admit: 2019-08-12 | Discharge: 2019-08-12 | Disposition: A | Discharge status: Home / Self Care | Payer: Medicaid Other | Source: Ambulatory Visit | Attending: Radiation Oncology | Admitting: Radiation Oncology

## 2019-08-12 ENCOUNTER — Inpatient Hospital Stay: Payer: Medicaid Other

## 2019-08-12 VITALS — BP 110/74 | HR 107 | Temp 98.3°F | Resp 17 | Wt 194.0 lb

## 2019-08-12 DIAGNOSIS — C342 Malignant neoplasm of middle lobe, bronchus or lung: Secondary | ICD-10-CM | POA: Insufficient documentation

## 2019-08-12 DIAGNOSIS — Z5111 Encounter for antineoplastic chemotherapy: Secondary | ICD-10-CM | POA: Diagnosis present

## 2019-08-12 DIAGNOSIS — C3491 Malignant neoplasm of unspecified part of right bronchus or lung: Secondary | ICD-10-CM | POA: Diagnosis not present

## 2019-08-12 LAB — CBC WITH DIFFERENTIAL (CANCER CENTER ONLY)
Abs Immature Granulocytes: 0.03 10*3/uL (ref 0.00–0.07)
Basophils Absolute: 0 10*3/uL (ref 0.0–0.1)
Basophils Relative: 1 %
Eosinophils Absolute: 0 10*3/uL (ref 0.0–0.5)
Eosinophils Relative: 0 %
HCT: 38.1 % (ref 36.0–46.0)
Hemoglobin: 12.8 g/dL (ref 12.0–15.0)
Immature Granulocytes: 2 %
Lymphocytes Relative: 43 %
Lymphs Abs: 0.8 10*3/uL (ref 0.7–4.0)
MCH: 32 pg (ref 26.0–34.0)
MCHC: 33.6 g/dL (ref 30.0–36.0)
MCV: 95.3 fL (ref 80.0–100.0)
Monocytes Absolute: 0.1 10*3/uL (ref 0.1–1.0)
Monocytes Relative: 5 %
Neutro Abs: 1 10*3/uL — ABNORMAL LOW (ref 1.7–7.7)
Neutrophils Relative %: 49 %
Platelet Count: 154 10*3/uL (ref 150–400)
RBC: 4 MIL/uL (ref 3.87–5.11)
RDW: 14 % (ref 11.5–15.5)
WBC Count: 1.9 10*3/uL — ABNORMAL LOW (ref 4.0–10.5)
nRBC: 0 % (ref 0.0–0.2)

## 2019-08-12 LAB — CMP (CANCER CENTER ONLY)
ALT: 16 U/L (ref 0–44)
AST: 16 U/L (ref 15–41)
Albumin: 3.8 g/dL (ref 3.5–5.0)
Alkaline Phosphatase: 95 U/L (ref 38–126)
Anion gap: 12 (ref 5–15)
BUN: 17 mg/dL (ref 8–23)
CO2: 21 mmol/L — ABNORMAL LOW (ref 22–32)
Calcium: 9.5 mg/dL (ref 8.9–10.3)
Chloride: 105 mmol/L (ref 98–111)
Creatinine: 1.02 mg/dL — ABNORMAL HIGH (ref 0.44–1.00)
GFR, Est AFR Am: >60 mL/min (ref >60)
GFR, Estimated: 58 mL/min — ABNORMAL LOW (ref >60)
Glucose, Bld: 104 mg/dL — ABNORMAL HIGH (ref 70–99)
Potassium: 4.6 mmol/L (ref 3.5–5.1)
Sodium: 138 mmol/L (ref 135–145)
Total Bilirubin: 0.7 mg/dL (ref 0.3–1.2)
Total Protein: 7.9 g/dL (ref 6.5–8.1)

## 2019-08-12 MED ORDER — SODIUM CHLORIDE 0.9 % IV SOLN
Freq: Once | INTRAVENOUS | Status: AC
  Filled 2019-08-12: qty 250

## 2019-08-12 MED ORDER — FAMOTIDINE IN NACL 20-0.9 MG/50ML-% IV SOLN
20.0000 mg | Freq: Once | INTRAVENOUS | Status: AC
  Administered 2019-08-12: 20 mg via INTRAVENOUS

## 2019-08-12 MED ORDER — SODIUM CHLORIDE 0.9 % IV SOLN
200.0000 mg | Freq: Once | INTRAVENOUS | Status: AC
  Administered 2019-08-12: 200 mg via INTRAVENOUS
  Filled 2019-08-12: qty 20

## 2019-08-12 MED ORDER — FAMOTIDINE IN NACL 20-0.9 MG/50ML-% IV SOLN
INTRAVENOUS | Status: AC
  Filled 2019-08-12: qty 50

## 2019-08-12 MED ORDER — DIPHENHYDRAMINE HCL 50 MG/ML IJ SOLN
50.0000 mg | Freq: Once | INTRAMUSCULAR | Status: AC
  Administered 2019-08-12: 50 mg via INTRAVENOUS

## 2019-08-12 MED ORDER — PALONOSETRON HCL INJECTION 0.25 MG/5ML
INTRAVENOUS | Status: AC
  Filled 2019-08-12: qty 5

## 2019-08-12 MED ORDER — SODIUM CHLORIDE 0.9 % IV SOLN
20.0000 mg | Freq: Once | INTRAVENOUS | Status: AC
  Administered 2019-08-12: 20 mg via INTRAVENOUS
  Filled 2019-08-12: qty 20

## 2019-08-12 MED ORDER — PALONOSETRON HCL INJECTION 0.25 MG/5ML
0.2500 mg | Freq: Once | INTRAVENOUS | Status: AC
  Administered 2019-08-12: 0.25 mg via INTRAVENOUS

## 2019-08-12 MED ORDER — DIPHENHYDRAMINE HCL 50 MG/ML IJ SOLN
INTRAMUSCULAR | Status: AC
  Filled 2019-08-12: qty 1

## 2019-08-12 MED ORDER — SODIUM CHLORIDE 0.9 % IV SOLN
45.0000 mg/m2 | Freq: Once | INTRAVENOUS | Status: AC
  Administered 2019-08-12: 90 mg via INTRAVENOUS
  Filled 2019-08-12: qty 15

## 2019-08-12 NOTE — Progress Notes (Signed)
Okay to treat with ANC 1.0 and HR 107 per Dr. Julien Nordmann.

## 2019-08-12 NOTE — Telephone Encounter (Signed)
Patient scheduled with JE 08/13/19

## 2019-08-12 NOTE — Progress Notes (Deleted)
Subjective:   PATIENT ID: Janice Adams GENDER: female DOB: May 25, 1955, MRN: [id number]   HPI  No chief complaint on file.  Reason for Visit: Follow-up  ***Reviewed note from 08/05/19 by Dr. Julien Nordmann in Oncology who is following her for her stage IIIb (T3, N2, M0) non-small cell lung cancer, adenocarcinoma presented with right hilar mass with occlusion of the right middle lobe bronchus as well as right middle lobe collapse in addition to right lower lobe pulmonary nodule diagnosed in January 2021. She is currently undergoing a course of concurrent chemoradiation with weekly carboplatin and paclitaxel status post 4 cycles.  Ms. Janice Adams is a 64 year old female with significant tobacco history and GERD who presents for right hilar lung mass.  Reviewed 05/21/2019 oncology note by Dr. Star Age was initially seen by PCP in November 2020 for shortness of breath, cough and chest tightness.  Chest imaging on 04/27/2019 demonstrated right infrahilar mass measuring 5.3 x 3.6 x 3.2 cm and right middle lobe occlusion and collapse.  When seen by oncology she had cough and chest tightness. Referred to Pulmonary.  She report vague right-mid chest pain. Denies cough, hemoptysis, shortness of breath. She reports fatigue and occasional expiratory wheezing. Denies activity limitations. She lives with her teenage grandchildren.   Her mother and grandmother have passed away from breast cancer. Her last mammogram was in 2018 and has previously been normal in the past.  Social History: Active smoker. 1/2 ppd x 40 years. Trying to cut back.  I have personally reviewed patient's past medical/family/social history, allergies, current medications.  Past Medical History:  Diagnosis Date  . Back pain   . GERD (gastroesophageal reflux disease)   . Gout    no current problems per patient 05/31/19  . History of epigastric pain    comes and goes  . Hypertension    no meds     Family History    Problem Relation Age of Onset  . Breast cancer Mother   . Cancer Mother   . Seizures Father   . Breast cancer Maternal Grandmother   . Breast cancer Daughter   . Cancer Daughter   . Heart attack Brother      Social History   Occupational History  . Occupation: Childcare  Tobacco Use  . Smoking status: Current Some Day Smoker    Packs/day: 0.50    Years: 47.00    Pack years: 23.50    Types: Cigarettes  . Smokeless tobacco: Never Used  Substance and Sexual Activity  . Alcohol use: No    Alcohol/week: 0.0 standard drinks  . Drug use: No    Types: Marijuana    Comment: last use Wed 05/28/19  . Sexual activity: Not on file    No Known Allergies   Outpatient Medications Prior to Visit  Medication Sig Dispense Refill  . cephALEXin (KEFLEX) 500 MG capsule Take 1 capsule (500 mg total) by mouth 2 (two) times daily for 7 days. 14 capsule 0  . naproxen sodium (ALEVE) 220 MG tablet Take 440 mg by mouth 2 (two) times daily as needed (back pain.).    Marland Kitchen omeprazole (PRILOSEC) 20 MG capsule Take 1 capsule (20 mg total) by mouth 2 (two) times daily before a meal. To lower stomach acid 60 capsule 5  . prochlorperazine (COMPAZINE) 10 MG tablet Take 1 tablet (10 mg total) by mouth every 6 (six) hours as needed for nausea or vomiting. 30 tablet 1  . varenicline (CHANTIX PAK) 0.5 MG X 11 &  1 MG X 42 tablet Take one 0.5 mg tablet by mouth once daily for 3 days, then increase to one 0.5 mg tablet twice daily for 4 days, then increase to one 1 mg tablet twice daily. 53 tablet 0   Facility-Administered Medications Prior to Visit  Medication Dose Route Frequency Provider Last Rate Last Admin  . CARBOplatin (PARAPLATIN) 200 mg in sodium chloride 0.9 % 100 mL chemo infusion  200 mg Intravenous Once Curt Bears, MD      . PACLitaxel (TAXOL) 90 mg in sodium chloride 0.9 % 250 mL chemo infusion (</= 80mg /m2)  45 mg/m2 (Treatment Plan Recorded) Intravenous Once Curt Bears, MD 265 mL/hr at  08/12/19 1120 90 mg at 08/12/19 1120    Review of Systems  Constitutional: Negative for chills, diaphoresis, fever, malaise/fatigue and weight loss.  HENT: Negative for congestion, ear pain and sore throat.   Respiratory: Positive for shortness of breath. Negative for cough, hemoptysis, sputum production and wheezing.   Cardiovascular: Positive for chest pain. Negative for palpitations and leg swelling.  Gastrointestinal: Negative for abdominal pain, heartburn and nausea.  Genitourinary: Negative for frequency.  Musculoskeletal: Negative for joint pain and myalgias.  Skin: Negative for itching and rash.  Neurological: Negative for dizziness, weakness and headaches.  Endo/Heme/Allergies: Does not bruise/bleed easily.  Psychiatric/Behavioral: Negative for depression. The patient is nervous/anxious.      Objective:   There were no vitals filed for this visit.   Physical Exam: General: Well-appearing, no acute distress HENT: [place], AT Eyes: EOMI, no scleral icterus Respiratory: Clear to auscultation bilaterally.  No crackles, wheezing or rales Cardiovascular: RRR, -M/R/G, no JVD GI: BS+, soft, nontender Extremities:-Edema,-tenderness Neuro: AAO x4, CNII-XII grossly intact Skin: Intact, no rashes or bruising Psych: Normal mood, normal affect  Data Reviewed:  Imaging: CT chest 04/27/2019-right infrahilar mass measured 5.3 x 3.6 x 3.2 cm with extension  PFT: None on file  Labs: CBC    Component Value Date/Time   WBC 1.9 (L) 08/12/2019 0757   WBC 4.6 05/11/2016 1157   RBC 4.00 08/12/2019 0757   HGB 12.8 08/12/2019 0757   HGB 13.9 03/06/2018 1128   HCT 38.1 08/12/2019 0757   HCT 41.8 03/06/2018 1128   PLT 154 08/12/2019 0757   PLT 337 03/06/2018 1128   MCV 95.3 08/12/2019 0757   MCV 94 03/06/2018 1128   MCH 32.0 08/12/2019 0757   MCHC 33.6 08/12/2019 0757   RDW 14.0 08/12/2019 0757   RDW 12.3 03/06/2018 1128   LYMPHSABS 0.8 08/12/2019 0757   LYMPHSABS 1.7 03/06/2018  1128   MONOABS 0.1 08/12/2019 0757   EOSABS 0.0 08/12/2019 0757   EOSABS 0.0 03/06/2018 1128   BASOSABS 0.0 08/12/2019 0757   BASOSABS 0.0 03/06/2018 1128   BMET    Component Value Date/Time   NA 138 08/12/2019 0757   NA 146 (H) 03/06/2018 1128   K 4.6 08/12/2019 0757   CL 105 08/12/2019 0757   CO2 21 (L) 08/12/2019 0757   GLUCOSE 104 (H) 08/12/2019 0757   BUN 17 08/12/2019 0757   BUN 8 03/06/2018 1128   CREATININE 1.02 (H) 08/12/2019 0757   CREATININE 0.85 05/11/2016 1157   CALCIUM 9.5 08/12/2019 0757   GFRNONAA 58 (L) 08/12/2019 0757   GFRNONAA 73 11/08/2012 1212   GFRAA >60 08/12/2019 0757   GFRAA 84 11/08/2012 1212   Imaging, labs and tests noted above have been reviewed independently by me.    Assessment & Plan:   Discussion: 64 year old  female active smoker with GERD who presents with right hilar lung mass.   Right infrahilar lung mass Based on imaging, lung mass is highly concerning for malignancy. May potentially represent infection. We discussed diagnostic testing including CT-guided biopsy, bronchoscopy and surgery vs conservative management with further imaging. After addressing patient/family's questions, patient wishes to pursue diagnostic testing via bronchoscopy. We discussed risks and benefits of procedure including infection, bleeding and lung collapse. Patient consented to procedure. Coordinated procedure with RN and OR scheduler. --Will arrange for EBUS to tissue sampling  Tobacco abuse Patient is an active smoker. We discussed smoking cessation for 8 minutes. We discussed triggers and stressors and ways to deal with them. We discussed barriers to continued smoking and benefits of smoking cessation. Provided patient with information cessation techniques and interventions including [place] quitline. --Chantix provided  Health Maintenance  There is no immunization history on file for this patient.   CT Lung Screen - not qualified  No orders of the defined  types were placed in this encounter. No orders of the defined types were placed in this encounter.   No follow-ups on file.  I have spent a total time of 60-minutes on the day of the appointment reviewing prior documentation, coordinating care and discussing medical diagnosis and plan with the patient/family. Imaging, labs and tests included in this note have been reviewed and interpreted independently by me.  [place], MD [facility] 08/12/2019 11:46 AM  Office Number [phone]

## 2019-08-12 NOTE — Patient Instructions (Addendum)
Cancer Center Discharge Instructions for Patients Receiving Chemotherapy  Today you received the following chemotherapy agents Taxol, Carboplatin  To help prevent nausea and vomiting after your treatment, we encourage you to take your nausea medication as directed  If you develop nausea and vomiting that is not controlled by your nausea medication, call the clinic.   BELOW ARE SYMPTOMS THAT SHOULD BE REPORTED IMMEDIATELY:  *FEVER GREATER THAN 100.5 F  *CHILLS WITH OR WITHOUT FEVER  NAUSEA AND VOMITING THAT IS NOT CONTROLLED WITH YOUR NAUSEA MEDICATION  *UNUSUAL SHORTNESS OF BREATH  *UNUSUAL BRUISING OR BLEEDING  TENDERNESS IN MOUTH AND THROAT WITH OR WITHOUT PRESENCE OF ULCERS  *URINARY PROBLEMS  *BOWEL PROBLEMS  UNUSUAL RASH Items with * indicate a potential emergency and should be followed up as soon as possible.  Feel free to call the clinic should you have any questions or concerns. The clinic phone number is [phone].  Please show the CHEMO ALERT CARD at check-in to the Emergency Department and triage nurse.   

## 2019-08-12 NOTE — Progress Notes (Signed)
Leave carboplatin dose at 200 mg today per Dr. Julien Nordmann. Orders changed per his instructions.

## 2019-08-12 NOTE — Telephone Encounter (Signed)
Janice Adams - can you please advise when the pt can be scheduled?

## 2019-08-13 ENCOUNTER — Ambulatory Visit
Admission: RE | Admit: 2019-08-13 | Discharge: 2019-08-13 | Disposition: A | Discharge status: Home / Self Care | Payer: Medicaid Other | Source: Ambulatory Visit | Attending: Radiation Oncology | Admitting: Radiation Oncology

## 2019-08-13 ENCOUNTER — Other Ambulatory Visit: Payer: Self-pay

## 2019-08-13 ENCOUNTER — Ambulatory Visit (INDEPENDENT_AMBULATORY_CARE_PROVIDER_SITE_OTHER): Payer: Medicaid Other | Admitting: Pulmonary Disease

## 2019-08-13 ENCOUNTER — Encounter: Payer: Self-pay | Admitting: Pulmonary Disease

## 2019-08-13 VITALS — BP 150/76 | HR 119 | Temp 97.2°F | Ht 64.0 in | Wt 194.4 lb

## 2019-08-13 DIAGNOSIS — Z720 Tobacco use: Secondary | ICD-10-CM | POA: Diagnosis not present

## 2019-08-13 DIAGNOSIS — C3491 Malignant neoplasm of unspecified part of right bronchus or lung: Secondary | ICD-10-CM

## 2019-08-13 NOTE — Progress Notes (Signed)
Pharmacist Chemotherapy Monitoring - Follow Up Assessment    I verify that I have reviewed each item in the below checklist:  . Regimen for the patient is scheduled for the appropriate day and plan matches scheduled date. Marland Kitchen Appropriate non-routine labs are ordered dependent on drug ordered. . If applicable, additional medications reviewed and ordered per protocol based on lifetime cumulative doses and/or treatment regimen.   Plan for follow-up and/or issues identified: No . I-vent associated with next due treatment: No . MD and/or nursing notified: No  Janice Adams D 08/13/2019 12:56 PM

## 2019-08-13 NOTE — Progress Notes (Signed)
Subjective:   PATIENT ID: Janice Adams GENDER: female DOB: [birth year], MRN: [id number]   HPI  Chief Complaint  Patient presents with  . Follow-up    lung mass.  no sob. so cough.   Reason for Visit: Follow-up  Ms. Janice Adams is a 64 year old female with adenocarcinoma of the right lung on chemoradiation who presents for follow-up.  Reviewed note from 08/05/19 by Dr. Julien Nordmann in Oncology who is following her for her stage IIIb (T3, N2, M0) non-small cell lung cancer, adenocarcinoma presented with right hilar mass with occlusion of the right middle lobe bronchus as well as right middle lobe collapse in addition to right lower lobe pulmonary nodule diagnosed in January 2021. She is currently undergoing a course of concurrent chemoradiation with weekly carboplatin and paclitaxel status post 4 cycles.  She reports fatigue after her chemotherapy sessions. She is trying to be active as possible and is walking on the weekends. She quit smoking once she had her diagnosis. She has not needed her Chantix. Denies cough, shortness of breath, wheezing or chest pain. She is currently being treated for a UTI however still have increased frequency and burning despite antibiotics.  Social History: Active smoker. 1/2 ppd x 40 years. Trying to cut back. She lives with her teenage grandchildren.   I have personally reviewed patient's past medical/family/social history/allergies/current medications.  Past Medical History:  Diagnosis Date  . Back pain   . GERD (gastroesophageal reflux disease)   . Gout    no current problems per patient 05/31/19  . History of epigastric pain    comes and goes  . Hypertension    no meds     Family History  Problem Relation Age of Onset  . Breast cancer Mother   . Cancer Mother   . Seizures Father   . Breast cancer Maternal Grandmother   . Breast cancer Daughter   . Cancer Daughter   . Heart attack Brother      Social History   Occupational History   . Occupation: Childcare  Tobacco Use  . Smoking status: Former Smoker    Packs/day: 0.50    Years: 47.00    Pack years: 23.50    Types: Cigarettes    Quit date: 07/07/2019    Years since quitting: 0.1  . Smokeless tobacco: Never Used  Substance and Sexual Activity  . Alcohol use: No    Alcohol/week: 0.0 standard drinks  . Drug use: No    Types: Marijuana    Comment: last use Wed 05/28/19  . Sexual activity: Not on file    No Known Allergies   Outpatient Medications Prior to Visit  Medication Sig Dispense Refill  . cephALEXin (KEFLEX) 500 MG capsule Take 1 capsule (500 mg total) by mouth 2 (two) times daily for 7 days. 14 capsule 0  . naproxen sodium (ALEVE) 220 MG tablet Take 440 mg by mouth 2 (two) times daily as needed (back pain.).    Marland Kitchen omeprazole (PRILOSEC) 20 MG capsule Take 1 capsule (20 mg total) by mouth 2 (two) times daily before a meal. To lower stomach acid 60 capsule 5  . prochlorperazine (COMPAZINE) 10 MG tablet Take 1 tablet (10 mg total) by mouth every 6 (six) hours as needed for nausea or vomiting. 30 tablet 1  . varenicline (CHANTIX PAK) 0.5 MG X 11 & 1 MG X 42 tablet Take one 0.5 mg tablet by mouth once daily for 3 days, then increase to one 0.5 mg  tablet twice daily for 4 days, then increase to one 1 mg tablet twice daily. 53 tablet 0   No facility-administered medications prior to visit.    Review of Systems  Constitutional: Negative for chills, diaphoresis, fever, malaise/fatigue and weight loss.  HENT: Negative for congestion.   Respiratory: Negative for cough, hemoptysis, sputum production, shortness of breath and wheezing.   Cardiovascular: Negative for chest pain, palpitations and leg swelling.  Genitourinary: Positive for dysuria and frequency.     Objective:   Vitals:   08/13/19 1121  BP: (!) 150/76  Pulse: (!) 119  Temp: (!) 97.2 F (36.2 C)  TempSrc: Temporal  SpO2: 93%  Weight: 194 lb 6.4 oz (88.2 kg)  Height: 5\' 4"  (1.626 m)   SpO2:  93 % O2 Device: None (Room air)  Physical Exam: General: Well-appearing, no acute distress HENT: [place], AT Eyes: EOMI, no scleral icterus Respiratory: Clear to auscultation bilaterally.  No crackles, wheezing or rales Cardiovascular: RRR, -M/R/G, no JVD GI: BS+, soft, nontender Extremities:-Edema,-tenderness Neuro: AAO x4, CNII-XII grossly intact Skin: Intact, no rashes or bruising Psych: Normal mood, normal affect  Data Reviewed:  Imaging: CT chest 04/27/2019-right infrahilar mass measured 5.3 x 3.6 x 3.2 cm with extension  PFT: 07/01/19 FVC 2.34 (91%) FEV1 1.88 (94%) Ratio 81  TLC 86% DLCO 67% Interpretation: No obstructive or restrictive defect. Reduced gas exchange.  Labs: CBC    Component Value Date/Time   WBC 1.9 (L) 08/12/2019 0757   WBC 4.6 05/11/2016 1157   RBC 4.00 08/12/2019 0757   HGB 12.8 08/12/2019 0757   HGB 13.9 03/06/2018 1128   HCT 38.1 08/12/2019 0757   HCT 41.8 03/06/2018 1128   PLT 154 08/12/2019 0757   PLT 337 03/06/2018 1128   MCV 95.3 08/12/2019 0757   MCV 94 03/06/2018 1128   MCH 32.0 08/12/2019 0757   MCHC 33.6 08/12/2019 0757   RDW 14.0 08/12/2019 0757   RDW 12.3 03/06/2018 1128   LYMPHSABS 0.8 08/12/2019 0757   LYMPHSABS 1.7 03/06/2018 1128   MONOABS 0.1 08/12/2019 0757   EOSABS 0.0 08/12/2019 0757   EOSABS 0.0 03/06/2018 1128   BASOSABS 0.0 08/12/2019 0757   BASOSABS 0.0 03/06/2018 1128   BMET    Component Value Date/Time   NA 138 08/12/2019 0757   NA 146 (H) 03/06/2018 1128   K 4.6 08/12/2019 0757   CL 105 08/12/2019 0757   CO2 21 (L) 08/12/2019 0757   GLUCOSE 104 (H) 08/12/2019 0757   BUN 17 08/12/2019 0757   BUN 8 03/06/2018 1128   CREATININE 1.02 (H) 08/12/2019 0757   CREATININE 0.85 05/11/2016 1157   CALCIUM 9.5 08/12/2019 0757   GFRNONAA 58 (L) 08/12/2019 0757   GFRNONAA 73 11/08/2012 1212   GFRAA >60 08/12/2019 0757   GFRAA 84 11/08/2012 1212   Imaging, labs and tests noted above have been reviewed independently by  me.    Assessment & Plan:   Discussion: 64 year old female former smoker with adenocarcinoma of the right lung on chemoradiation who presents for follow-up. No respiratory symptoms at this time. No indication for bronchodilators  Stage IIIb (T3, N2, M0) adenocarcinoma of the right lung - Continue care with Dr. Julien Nordmann  Tobacco abuse - Congratulations on quitting! - Counseled on identifying triggers to avoid relapse  Health Maintenance Immunization History  Administered Date(s) Administered  . Influenza,inj,Quad PF,6+ Mos 02/07/2019     CT Lung Screen - not qualified  No orders of the defined types were placed  in this encounter. No orders of the defined types were placed in this encounter.   Return in about 1 year (around 08/12/2020).  [place], MD [facility] 08/13/2019 11:27 AM  Office Number [phone]

## 2019-08-14 ENCOUNTER — Ambulatory Visit
Admission: RE | Admit: 2019-08-14 | Discharge: 2019-08-14 | Disposition: A | Discharge status: Home / Self Care | Payer: Medicaid Other | Source: Ambulatory Visit | Attending: Radiation Oncology | Admitting: Radiation Oncology

## 2019-08-14 ENCOUNTER — Ambulatory Visit: Payer: Medicaid Other | Admitting: Family Medicine

## 2019-08-14 ENCOUNTER — Other Ambulatory Visit: Payer: Self-pay

## 2019-08-14 DIAGNOSIS — C3491 Malignant neoplasm of unspecified part of right bronchus or lung: Secondary | ICD-10-CM | POA: Diagnosis not present

## 2019-08-15 ENCOUNTER — Ambulatory Visit
Admission: RE | Admit: 2019-08-15 | Discharge: 2019-08-15 | Disposition: A | Discharge status: Home / Self Care | Payer: Medicaid Other | Source: Ambulatory Visit | Attending: Radiation Oncology | Admitting: Radiation Oncology

## 2019-08-15 ENCOUNTER — Other Ambulatory Visit: Payer: Self-pay

## 2019-08-15 DIAGNOSIS — C3491 Malignant neoplasm of unspecified part of right bronchus or lung: Secondary | ICD-10-CM | POA: Diagnosis not present

## 2019-08-16 ENCOUNTER — Other Ambulatory Visit: Payer: Self-pay

## 2019-08-16 ENCOUNTER — Ambulatory Visit
Admission: RE | Admit: 2019-08-16 | Discharge: 2019-08-16 | Disposition: A | Discharge status: Home / Self Care | Payer: Medicaid Other | Source: Ambulatory Visit | Attending: Radiation Oncology | Admitting: Radiation Oncology

## 2019-08-16 DIAGNOSIS — C3491 Malignant neoplasm of unspecified part of right bronchus or lung: Secondary | ICD-10-CM | POA: Diagnosis not present

## 2019-08-19 ENCOUNTER — Inpatient Hospital Stay (HOSPITAL_COMMUNITY)
Admission: EM | Admit: 2019-08-19 | Discharge: 2019-08-21 | DRG: 872 | Disposition: A | Discharge status: Home / Self Care | Payer: Medicaid Other | Attending: Internal Medicine | Admitting: Internal Medicine

## 2019-08-19 ENCOUNTER — Emergency Department (HOSPITAL_COMMUNITY): Payer: Medicaid Other

## 2019-08-19 ENCOUNTER — Other Ambulatory Visit: Payer: Self-pay

## 2019-08-19 ENCOUNTER — Inpatient Hospital Stay: Payer: Medicaid Other

## 2019-08-19 ENCOUNTER — Encounter (HOSPITAL_COMMUNITY): Payer: Self-pay | Admitting: Emergency Medicine

## 2019-08-19 ENCOUNTER — Ambulatory Visit
Admission: RE | Admit: 2019-08-19 | Discharge: 2019-08-19 | Disposition: A | Discharge status: Home / Self Care | Payer: Medicaid Other | Source: Ambulatory Visit | Attending: Radiation Oncology | Admitting: Radiation Oncology

## 2019-08-19 ENCOUNTER — Other Ambulatory Visit: Payer: Self-pay | Admitting: Radiation Oncology

## 2019-08-19 ENCOUNTER — Inpatient Hospital Stay (HOSPITAL_BASED_OUTPATIENT_CLINIC_OR_DEPARTMENT_OTHER): Payer: Medicaid Other | Admitting: Internal Medicine

## 2019-08-19 ENCOUNTER — Encounter: Payer: Self-pay | Admitting: Internal Medicine

## 2019-08-19 VITALS — BP 107/92 | HR 142 | Temp 98.3°F | Resp 18 | Ht 64.0 in | Wt 189.0 lb

## 2019-08-19 DIAGNOSIS — A419 Sepsis, unspecified organism: Secondary | ICD-10-CM | POA: Diagnosis present

## 2019-08-19 DIAGNOSIS — Z87891 Personal history of nicotine dependence: Secondary | ICD-10-CM | POA: Diagnosis not present

## 2019-08-19 DIAGNOSIS — Z8249 Family history of ischemic heart disease and other diseases of the circulatory system: Secondary | ICD-10-CM

## 2019-08-19 DIAGNOSIS — C342 Malignant neoplasm of middle lobe, bronchus or lung: Secondary | ICD-10-CM | POA: Diagnosis present

## 2019-08-19 DIAGNOSIS — Z9221 Personal history of antineoplastic chemotherapy: Secondary | ICD-10-CM

## 2019-08-19 DIAGNOSIS — N390 Urinary tract infection, site not specified: Secondary | ICD-10-CM | POA: Diagnosis present

## 2019-08-19 DIAGNOSIS — Z923 Personal history of irradiation: Secondary | ICD-10-CM | POA: Diagnosis not present

## 2019-08-19 DIAGNOSIS — R918 Other nonspecific abnormal finding of lung field: Secondary | ICD-10-CM | POA: Diagnosis present

## 2019-08-19 DIAGNOSIS — N3000 Acute cystitis without hematuria: Secondary | ICD-10-CM

## 2019-08-19 DIAGNOSIS — R319 Hematuria, unspecified: Secondary | ICD-10-CM

## 2019-08-19 DIAGNOSIS — I10 Essential (primary) hypertension: Secondary | ICD-10-CM | POA: Diagnosis present

## 2019-08-19 DIAGNOSIS — Z5111 Encounter for antineoplastic chemotherapy: Secondary | ICD-10-CM

## 2019-08-19 DIAGNOSIS — I471 Supraventricular tachycardia: Secondary | ICD-10-CM | POA: Diagnosis present

## 2019-08-19 DIAGNOSIS — D8489 Other immunodeficiencies: Secondary | ICD-10-CM | POA: Diagnosis present

## 2019-08-19 DIAGNOSIS — C3491 Malignant neoplasm of unspecified part of right bronchus or lung: Secondary | ICD-10-CM

## 2019-08-19 DIAGNOSIS — R300 Dysuria: Secondary | ICD-10-CM

## 2019-08-19 DIAGNOSIS — Z803 Family history of malignant neoplasm of breast: Secondary | ICD-10-CM

## 2019-08-19 DIAGNOSIS — D709 Neutropenia, unspecified: Secondary | ICD-10-CM | POA: Diagnosis present

## 2019-08-19 DIAGNOSIS — Z20822 Contact with and (suspected) exposure to covid-19: Secondary | ICD-10-CM | POA: Diagnosis present

## 2019-08-19 HISTORY — DX: Urinary tract infection, site not specified: N39.0

## 2019-08-19 LAB — CBC WITH DIFFERENTIAL (CANCER CENTER ONLY)
Abs Immature Granulocytes: 0 10*3/uL (ref 0.00–0.07)
Basophils Absolute: 0 10*3/uL (ref 0.0–0.1)
Basophils Relative: 1 %
Eosinophils Absolute: 0 10*3/uL (ref 0.0–0.5)
Eosinophils Relative: 0 %
HCT: 38 % (ref 36.0–46.0)
Hemoglobin: 13 g/dL (ref 12.0–15.0)
Immature Granulocytes: 0 %
Lymphocytes Relative: 45 %
Lymphs Abs: 0.6 10*3/uL — ABNORMAL LOW (ref 0.7–4.0)
MCH: 32.8 pg (ref 26.0–34.0)
MCHC: 34.2 g/dL (ref 30.0–36.0)
MCV: 96 fL (ref 80.0–100.0)
Monocytes Absolute: 0.1 10*3/uL (ref 0.1–1.0)
Monocytes Relative: 7 %
Neutro Abs: 0.6 10*3/uL — ABNORMAL LOW (ref 1.7–7.7)
Neutrophils Relative %: 47 %
Platelet Count: 155 10*3/uL (ref 150–400)
RBC: 3.96 MIL/uL (ref 3.87–5.11)
RDW: 14.4 % (ref 11.5–15.5)
WBC Count: 1.3 10*3/uL — ABNORMAL LOW (ref 4.0–10.5)
nRBC: 0 % (ref 0.0–0.2)

## 2019-08-19 LAB — CMP (CANCER CENTER ONLY)
ALT: 19 U/L (ref 0–44)
AST: 19 U/L (ref 15–41)
Albumin: 4.2 g/dL (ref 3.5–5.0)
Alkaline Phosphatase: 97 U/L (ref 38–126)
Anion gap: 14 (ref 5–15)
BUN: 16 mg/dL (ref 8–23)
CO2: 22 mmol/L (ref 22–32)
Calcium: 10 mg/dL (ref 8.9–10.3)
Chloride: 101 mmol/L (ref 98–111)
Creatinine: 1.07 mg/dL — ABNORMAL HIGH (ref 0.44–1.00)
GFR, Est AFR Am: >60 mL/min (ref >60)
GFR, Estimated: 55 mL/min — ABNORMAL LOW (ref >60)
Glucose, Bld: 120 mg/dL — ABNORMAL HIGH (ref 70–99)
Potassium: 4.4 mmol/L (ref 3.5–5.1)
Sodium: 137 mmol/L (ref 135–145)
Total Bilirubin: 1.1 mg/dL (ref 0.3–1.2)
Total Protein: 8.5 g/dL — ABNORMAL HIGH (ref 6.5–8.1)

## 2019-08-19 LAB — URINALYSIS, ROUTINE W REFLEX MICROSCOPIC
Bilirubin Urine: NEGATIVE
Glucose, UA: NEGATIVE mg/dL
Ketones, ur: NEGATIVE mg/dL
Nitrite: NEGATIVE
Protein, ur: 30 mg/dL — AB
Specific Gravity, Urine: 1.02 (ref 1.005–1.030)
WBC, UA: >50 WBC/hpf — ABNORMAL HIGH (ref 0–5)
pH: 5 (ref 5.0–8.0)

## 2019-08-19 LAB — LACTIC ACID, PLASMA
Lactic Acid, Venous: 2.5 mmol/L (ref 0.5–1.9)
Lactic Acid, Venous: 3.1 mmol/L (ref 0.5–1.9)
Lactic Acid, Venous: 1.2 mmol/L (ref 0.5–1.9)

## 2019-08-19 LAB — SARS CORONAVIRUS 2 (TAT 6-24 HRS): SARS Coronavirus 2: NEGATIVE

## 2019-08-19 IMAGING — DX DG CHEST 1V PORT
1 series · 1 of 1 positions shown · non-contrast
Comparison: Chest x-ray dated [DATE].

CLINICAL DATA: Tachycardia.

EXAM:
PORTABLE CHEST 1 VIEW

[chest ap]
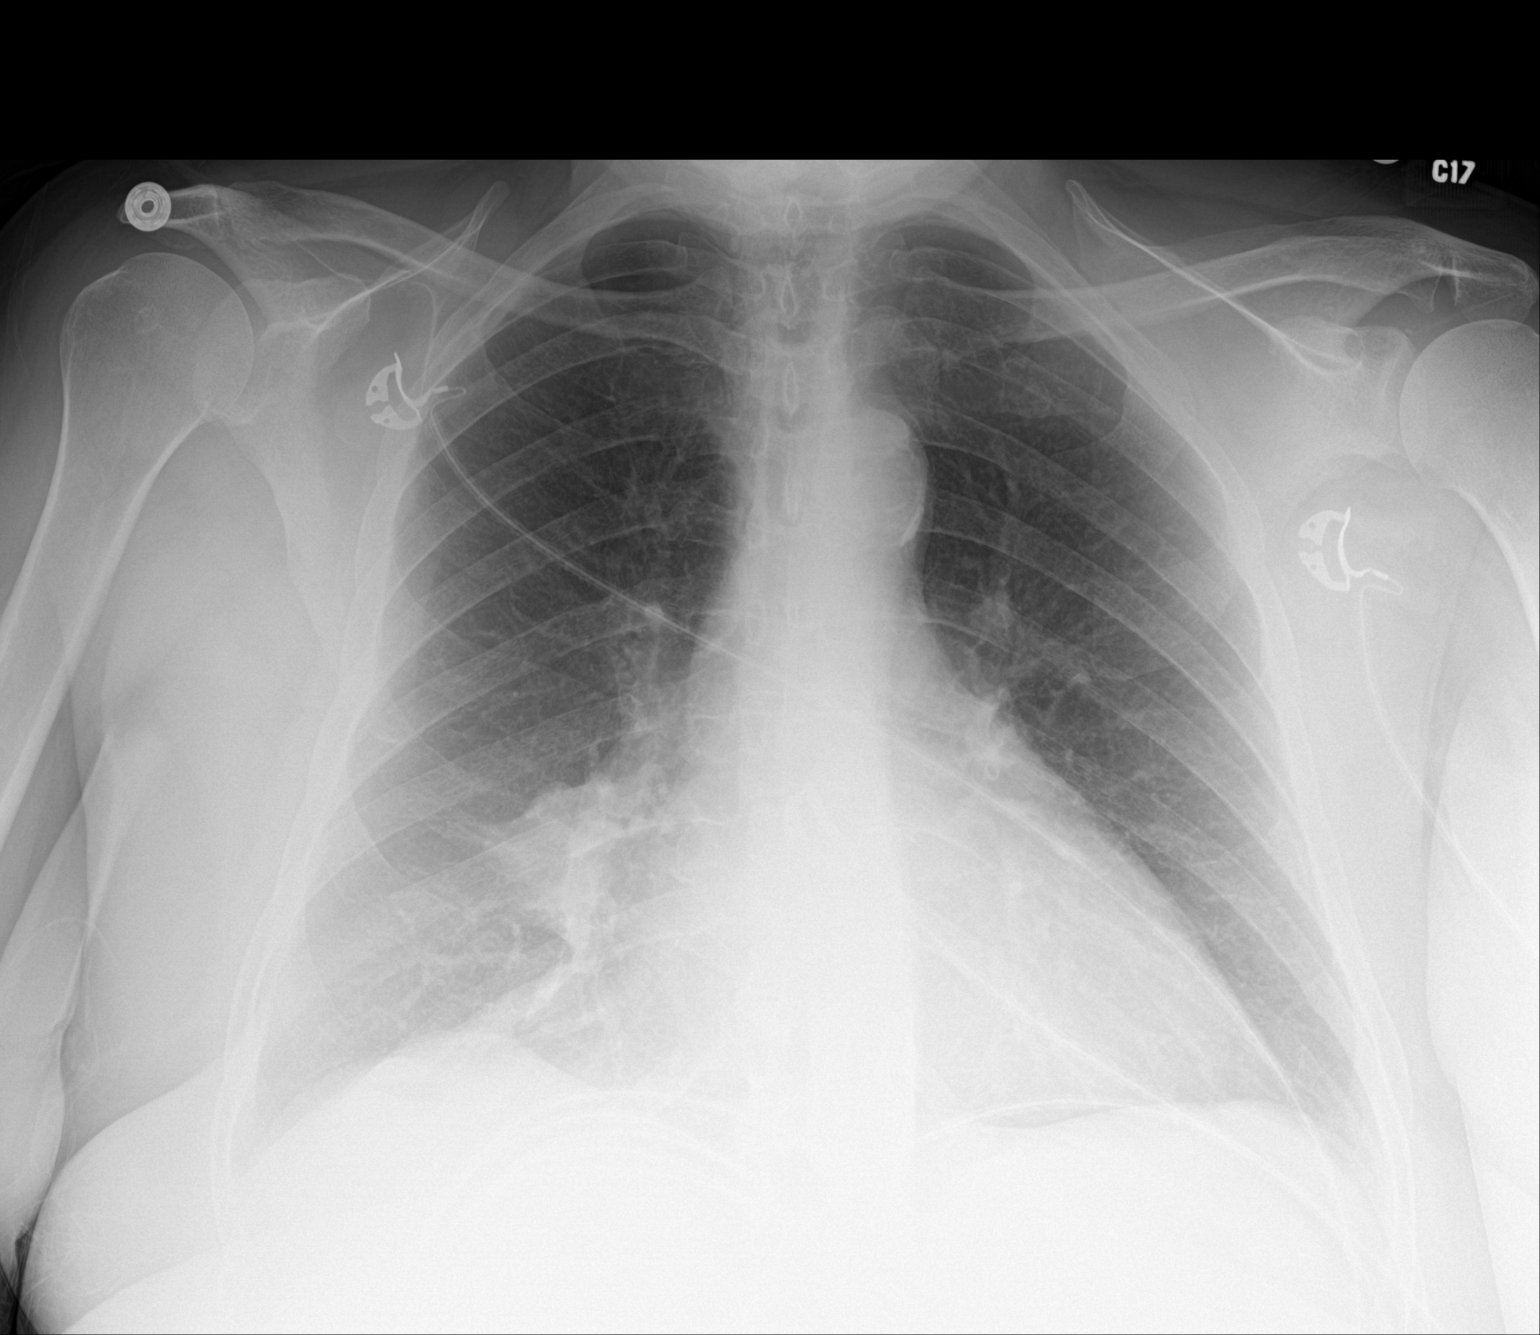

[1 of 1 positions shown; findings below may reference images not displayed]

FINDINGS: Grossly unchanged right infrahilar mass with associated right middle
lobe collapse. Normal heart size. Atherosclerotic calcification of
the aortic arch. Normal pulmonary vascularity. No pleural effusion
or pneumothorax. No acute osseous abnormality.
IMPRESSION: 1. Grossly unchanged right infrahilar mass with associated right
middle lobe collapse.

## 2019-08-19 MED ORDER — ACETAMINOPHEN 325 MG PO TABS
650.0000 mg | ORAL_TABLET | Freq: Four times a day (QID) | ORAL | Status: DC | PRN
  Administered 2019-08-20 (×2): 650 mg via ORAL
  Filled 2019-08-19 (×3): qty 2

## 2019-08-19 MED ORDER — NAPROXEN 250 MG PO TABS
500.0000 mg | ORAL_TABLET | Freq: Two times a day (BID) | ORAL | Status: DC | PRN
  Filled 2019-08-19: qty 2

## 2019-08-19 MED ORDER — DOCUSATE SODIUM 100 MG PO CAPS
100.0000 mg | ORAL_CAPSULE | Freq: Two times a day (BID) | ORAL | Status: DC
  Administered 2019-08-19 – 2019-08-20 (×2): 100 mg via ORAL
  Filled 2019-08-19 (×4): qty 1

## 2019-08-19 MED ORDER — ONDANSETRON HCL 4 MG PO TABS: 4.0000 mg | ORAL_TABLET | Freq: Four times a day (QID) | ORAL | Status: DC | PRN

## 2019-08-19 MED ORDER — ONDANSETRON HCL 4 MG/2ML IJ SOLN: 4.0000 mg | Freq: Four times a day (QID) | INTRAMUSCULAR | Status: DC | PRN

## 2019-08-19 MED ORDER — NAPROXEN SODIUM 220 MG PO TABS: 440.0000 mg | ORAL_TABLET | Freq: Two times a day (BID) | ORAL | Status: DC | PRN

## 2019-08-19 MED ORDER — POLYETHYLENE GLYCOL 3350 17 G PO PACK: 17.0000 g | PACK | Freq: Every day | ORAL | Status: DC | PRN

## 2019-08-19 MED ORDER — SODIUM CHLORIDE 0.9 % IV SOLN
1.0000 g | INTRAVENOUS | Status: DC
  Administered 2019-08-20 – 2019-08-21 (×2): 1 g via INTRAVENOUS
  Filled 2019-08-19 (×2): qty 1

## 2019-08-19 MED ORDER — SODIUM CHLORIDE 0.9 % IV SOLN: INTRAVENOUS | Status: DC

## 2019-08-19 MED ORDER — PANTOPRAZOLE SODIUM 40 MG PO TBEC
40.0000 mg | DELAYED_RELEASE_TABLET | Freq: Every day | ORAL | Status: DC
  Administered 2019-08-20 – 2019-08-21 (×2): 40 mg via ORAL
  Filled 2019-08-19 (×2): qty 1

## 2019-08-19 MED ORDER — SODIUM CHLORIDE 0.9 % IV BOLUS
1000.0000 mL | Freq: Once | INTRAVENOUS | Status: AC
  Administered 2019-08-19: 1000 mL via INTRAVENOUS

## 2019-08-19 MED ORDER — ENOXAPARIN SODIUM 40 MG/0.4ML ~~LOC~~ SOLN
40.0000 mg | SUBCUTANEOUS | Status: DC
  Administered 2019-08-19: 17:00:00 40 mg via SUBCUTANEOUS
  Filled 2019-08-19 (×2): qty 0.4

## 2019-08-19 MED ORDER — SODIUM CHLORIDE 0.9 % IV SOLN
2.0000 g | Freq: Once | INTRAVENOUS | Status: AC
  Administered 2019-08-19: 13:00:00 2 g via INTRAVENOUS
  Filled 2019-08-19: qty 20

## 2019-08-19 MED ORDER — ACETAMINOPHEN 650 MG RE SUPP: 650.0000 mg | Freq: Four times a day (QID) | RECTAL | Status: DC | PRN

## 2019-08-19 MED ORDER — PROCHLORPERAZINE MALEATE 10 MG PO TABS: 10.0000 mg | ORAL_TABLET | Freq: Four times a day (QID) | ORAL | Status: DC | PRN

## 2019-08-19 MED ORDER — SODIUM CHLORIDE 0.9 % IV BOLUS
1000.0000 mL | Freq: Once | INTRAVENOUS | Status: AC
  Administered 2019-08-19: 18:00:00 1000 mL via INTRAVENOUS

## 2019-08-19 MED ORDER — MELATONIN 3 MG PO TABS
6.0000 mg | ORAL_TABLET | Freq: Every evening | ORAL | Status: DC | PRN
  Administered 2019-08-19: 6 mg via ORAL
  Filled 2019-08-19: qty 2

## 2019-08-19 NOTE — ED Notes (Signed)
Delay in transport due to phlebotomy attempting to get 2nd lactic acid

## 2019-08-19 NOTE — H&P (Signed)
[facility] History and Physical  Janice Adams RDE:[id number] DOB: August 09, 1955 DOA: 08/19/2019  Referring physician: ED  PCP: Antony Blackbird, MD   Patient is coming from: Home  Chief Complaint: weakness  HPI: Janice Adams is a 64 y.o. female with past medical history of stage IIIb with right lung cancer with  occlusion of the right middle bronchus on concurrent chemoradiation with carboplatin and paclitaxel, was seen at the oncology clinic for regular follow-up.  Patient complained of  fatigue and weakness and had been having some burning urination.  Patient was recently evaluated by her primary care was treated for UTI but did not have complete resolution of her symptoms and persisted to have burning sensation.  In the clinic, the patient was also noted to be tachycardic with a heart rate up to 142's and was sent to the emergency department to assess for possible sepsis.  ED Course: In the ED, patient was noted to be mildly tachycardic.  She was afebrile.  CBC done from the clinic today showed a WBC of 1.3, creatinine of 1.07.  Baseline around 0.8.  Urinalysis was significant  For white cells and leukocytes.  Lactate was elevated at 2.5.  Blood cultures and urine cultures were sent from the ED.  COVID-19 test was sent from the ED as well. In the ED patient received IV fluid bolus, IV Rocephin and was considered for admission to the hospital  Review of Systems:  All systems were reviewed and were negative unless otherwise mentioned in the HPI  Past Medical History:  Diagnosis Date  . Back pain   . GERD (gastroesophageal reflux disease)   . Gout    no current problems per patient 05/31/19  . History of epigastric pain    comes and goes  . Hypertension    no meds   Past Surgical History:  Procedure Laterality Date  . MULTIPLE TOOTH EXTRACTIONS    . TONSILLECTOMY    . uterine ablation    . VIDEO BRONCHOSCOPY WITH ENDOBRONCHIAL ULTRASOUND N/A 06/04/2019   Procedure: VIDEO  BRONCHOSCOPY WITH ENDOBRONCHIAL ULTRASOUND;  Surgeon: Margaretha Seeds, MD;  Location: [facility];  Service: Thoracic;  Laterality: N/A;  . WISDOM TOOTH EXTRACTION      Social History:  reports that she quit smoking about 6 weeks ago. Her smoking use included cigarettes. She has a 23.50 pack-year smoking history. She has never used smokeless tobacco. She reports that she does not drink alcohol or use drugs.  No Known Allergies  Family History  Problem Relation Age of Onset  . Breast cancer Mother   . Cancer Mother   . Seizures Father   . Breast cancer Maternal Grandmother   . Breast cancer Daughter   . Cancer Daughter   . Heart attack Brother      Prior to Admission medications   Medication Sig Start Date End Date Taking? Authorizing Provider  naproxen sodium (ALEVE) 220 MG tablet Take 440 mg by mouth 2 (two) times daily as needed (back pain.).   Yes [provider]  omeprazole (PRILOSEC) 20 MG capsule Take 1 capsule (20 mg total) by mouth 2 (two) times daily before a meal. To lower stomach acid 03/15/19  Yes Fulp, Cammie, MD  prochlorperazine (COMPAZINE) 10 MG tablet Take 1 tablet (10 mg total) by mouth every 6 (six) hours as needed for nausea or vomiting. 07/29/19  Yes Curt Bears, MD    Physical Exam: Vitals:   08/19/19 1300 08/19/19 1330 08/19/19 1400 08/19/19 1430  BP: Marland Kitchen)  119/94 108/68 103/77 102/69  Pulse: (!) 108 (!) 111 (!) 105 (!) 108  Resp: 17 16 19 18   Temp:      TempSrc:      SpO2: 96% 97% 96% 96%  Weight:      Height:       Wt Readings from Last 3 Encounters:  08/19/19 83.5 kg  08/19/19 85.7 kg  08/13/19 88.2 kg   Body mass index is 31.58 kg/m.  General: obese built, not in obvious distress HENT: Normocephalic, pupils equally reacting to light and accommodation.  No scleral pallor or icterus noted. Oral mucosa is moist.  Chest:  Clear breath sounds.  Diminished breath sounds bilaterally. No crackles or wheezes.  CVS: S1 &S2 heard. No murmur.   Regular rate and rhythm. Abdomen: Soft, nontender, nondistended.  Bowel sounds are heard.  Liver is not palpable, no abdominal mass palpated Extremities: No cyanosis, clubbing or edema.  Peripheral pulses are palpable. Psych: Alert, awake and oriented, normal mood CNS:  No cranial nerve deficits.  Power equal in all extremities.   No cerebellar signs.   Skin: Warm and dry.  No rashes noted.  Labs on Admission:   CBC: Recent Labs  Lab 08/19/19 1001  WBC 1.3*  NEUTROABS 0.6*  HGB 13.0  HCT 38.0  MCV 96.0  PLT 595    Basic Metabolic Panel: Recent Labs  Lab 08/19/19 1001  NA 137  K 4.4  CL 101  CO2 22  GLUCOSE 120*  BUN 16  CREATININE 1.07*  CALCIUM 10.0    Liver Function Tests: Recent Labs  Lab 08/19/19 1001  AST 19  ALT 19  ALKPHOS 97  BILITOT 1.1  PROT 8.5*  ALBUMIN 4.2   No results for input(s): LIPASE, AMYLASE in the last 168 hours. No results for input(s): AMMONIA in the last 168 hours.  Cardiac Enzymes: No results for input(s): CKTOTAL, CKMB, CKMBINDEX, TROPONINI in the last 168 hours.  BNP (last 3 results) No results for input(s): BNP in the last 8760 hours.  ProBNP (last 3 results) No results for input(s): PROBNP in the last 8760 hours.  CBG: No results for input(s): GLUCAP in the last 168 hours.  Lipase     Component Value Date/Time   LIPASE 13 (L) 03/06/2018 1128     Urinalysis    Component Value Date/Time   COLORURINE YELLOW 08/19/2019 1148   APPEARANCEUR HAZY (A) 08/19/2019 1148   LABSPEC 1.020 08/19/2019 1148   PHURINE 5.0 08/19/2019 1148   GLUCOSEU NEGATIVE 08/19/2019 1148   HGBUR SMALL (A) 08/19/2019 1148   BILIRUBINUR NEGATIVE 08/19/2019 1148   BILIRUBINUR negative 08/07/2019 1215   KETONESUR NEGATIVE 08/19/2019 1148   PROTEINUR 30 (A) 08/19/2019 1148   UROBILINOGEN 0.2 08/07/2019 1215   NITRITE NEGATIVE 08/19/2019 1148   LEUKOCYTESUR SMALL (A) 08/19/2019 1148     Drugs of Abuse  No results found for: LABOPIA,  COCAINSCRNUR, LABBENZ, AMPHETMU, THCU, LABBARB    Radiological Exams on Admission: DG Chest Portable 1 View  Result Date: 08/19/2019 CLINICAL DATA:  Tachycardia. EXAM: PORTABLE CHEST 1 VIEW COMPARISON:  Chest x-ray dated June 04, 2019. FINDINGS: Grossly unchanged right infrahilar mass with associated right middle lobe collapse. Normal heart size. Atherosclerotic calcification of the aortic arch. Normal pulmonary vascularity. No pleural effusion or pneumothorax. No acute osseous abnormality. IMPRESSION: 1. Grossly unchanged right infrahilar mass with associated right middle lobe collapse. Electronically Signed   By: Titus Dubin M.D.   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  Patient was tachycardic,Tachypneic with leukopenia and elevated lactate at 2.5.  Currently on chemotherapy.  Received IV fluid hydration with improvement in tachycardia.  No fever.  Continue Rocephin IV.  Follow blood cultures urine cultures. IV fluids check lactic acid.  Stage IIIb lung cancer currently on concurrent chemoradiation.  Immunosuppressed status.  Rule out infection.  Continue supportive care including oxygen, nebulizer as necessary.  DVT Prophylaxis: Lovenox subcu  Consultant: Oncology Dr Julien Nordmann office sent the patient  Code Status: Full code  Microbiology blood cultures, urine culture sent from the ED  Antibiotics: Rocephin  Family Communication:  Patients' condition and plan of care including tests being ordered have been discussed with the patient  who indicate understanding and agree with the plan.  Disposition Plan: Home  Severity of Illness: The appropriate patient status for this patient is INPATIENT. Inpatient  status is judged to be reasonable and necessary in order to provide the required intensity of service to ensure the patient's safety. The patient's presenting symptoms, physical exam findings, and initial radiographic and laboratory data in the context of their chronic comorbidities is felt to place them at high risk for further clinical deterioration. Furthermore, it is not anticipated that the patient will be medically stable for discharge from the hospital within 2 midnights of admission. The following factors support the patient status of inpatient.   I certify that at the point of admission it is my clinical judgment that the patient will require inpatient hospital care spanning beyond 2 midnights from the point of admission due to high intensity of service, high risk for further deterioration and high frequency of surveillance required.   Signed, Flora Lipps, MD [facility] 08/19/2019

## 2019-08-19 NOTE — ED Notes (Signed)
ED TO INPATIENT HANDOFF REPORT  Name/Age/Gender Janice Adams 64 y.o. female  Code Status   Home/SNF/Other Home  Chief Complaint Sepsis [facility]) [A41.9]  Level of Care/Admitting Diagnosis ED Disposition    ED Disposition Condition Comment   Admit  Hospital Area: [facility] [[id number]]  Level of Care: Telemetry [5]  Admit to tele based on following criteria: Complex arrhythmia (Bradycardia/Tachycardia)  May admit patient to [facility] or [facility] if equivalent level of care is available:: No  Covid Evaluation: Asymptomatic Screening Protocol (No Symptoms)  Diagnosis: Sepsis [facility]) [[id number]]  Admitting Physician: Flora Lipps [[id number]]  Attending Physician: Flora Lipps [[id number]]  Estimated length of stay: past midnight tomorrow  Certification:: I certify this patient will need inpatient services for at least 2 midnights       Medical History Past Medical History:  Diagnosis Date  . Back pain   . GERD (gastroesophageal reflux disease)   . Gout    no current problems per patient 05/31/19  . History of epigastric pain    comes and goes  . Hypertension    no meds    Allergies No Known Allergies  IV Location/Drains/Wounds Patient Lines/Drains/Airways Status   Active Line/Drains/Airways    Name:   Placement date:   Placement time:   Site:   Days:   Peripheral IV 08/19/19 Left Hand   08/19/19    1232    Hand   less than 1   Peripheral IV 08/19/19 Right Antecubital   08/19/19    1233    Antecubital   less than 1          Labs/Imaging Results for orders placed or performed during the hospital encounter of 08/19/19 (from the past 48 hour(s))  Urinalysis, Routine w reflex microscopic     Status: Abnormal   Collection Time: 08/19/19 11:48 AM  Result Value Ref Range   Color, Urine YELLOW YELLOW   APPearance HAZY (A) CLEAR   Specific Gravity, Urine 1.020 1.005 - 1.030   pH 5.0 5.0 - 8.0   Glucose, UA NEGATIVE NEGATIVE mg/dL   Hgb urine  dipstick SMALL (A) NEGATIVE   Bilirubin Urine NEGATIVE NEGATIVE   Ketones, ur NEGATIVE NEGATIVE mg/dL   Protein, ur 30 (A) NEGATIVE mg/dL   Nitrite NEGATIVE NEGATIVE   Leukocytes,Ua SMALL (A) NEGATIVE   RBC / HPF 0-5 0 - 5 RBC/hpf   WBC, UA >50 (H) 0 - 5 WBC/hpf   Bacteria, UA RARE (A) NONE SEEN   Squamous Epithelial / LPF 0-5 0 - 5   Mucus PRESENT    Hyaline Casts, UA PRESENT     Comment: Performed at [facility], [place] [address] [place], [place] [address]  Lactic acid, plasma     Status: Abnormal   Collection Time: 08/19/19 11:48 AM  Result Value Ref Range   Lactic Acid, Venous 2.5 (HH) 0.5 - 1.9 mmol/L    Comment: CRITICAL RESULT CALLED TO, READ BACK BY AND VERIFIED WITH: Kathryne Sharper. RN @1339  08/19/19 BILLINGSLEY,L Performed at [facility], [place] [address] [place], [place] [address]    DG Chest Portable 1 View  Result Date: 08/19/2019 CLINICAL DATA:  Tachycardia. EXAM: PORTABLE CHEST 1 VIEW COMPARISON:  Chest x-ray dated June 04, 2019. FINDINGS: Grossly unchanged right infrahilar mass with associated right middle lobe collapse. Normal heart size. Atherosclerotic calcification of the aortic arch. Normal pulmonary vascularity. No pleural effusion or pneumothorax. No acute osseous abnormality. IMPRESSION: 1. Grossly unchanged right infrahilar mass  with associated right middle lobe collapse. Electronically Signed   By: Titus Dubin M.D.   On: 08/19/2019 13:07    Pending Labs Unresulted Labs (From admission, onward)    Start     Ordered   08/19/19 1228  SARS CORONAVIRUS 2 (TAT 6-24 HRS) Nasopharyngeal Nasopharyngeal Swab  (Tier 3 (TAT 6-24 hrs))  Once,   STAT    Question Answer Comment  Is this test for diagnosis or screening Screening   Symptomatic for COVID-19 as defined by CDC No   Hospitalized for COVID-19 No   Admitted to ICU for COVID-19 No   Previously tested for COVID-19 No   Resident in a congregate (group) care setting No    Employed in healthcare setting No   Pregnant No      08/19/19 1228   08/19/19 1151  Urine culture  ONCE - STAT,   STAT     08/19/19 1150   08/19/19 1148  Lactic acid, plasma  Now then every 2 hours,   STAT     08/19/19 1147   08/19/19 1148  Blood culture (routine x 2)  BLOOD CULTURE X 2,   STAT     08/19/19 1147   Signed and Held  HIV Antibody (routine testing w rflx)  (HIV Antibody (Routine testing w reflex) panel)  Once,   R     Signed and Held   Signed and Held  Comprehensive metabolic panel  Tomorrow morning,   R     Signed and Held   Signed and Held  CBC  Tomorrow morning,   R     Signed and Held          Vitals/Pain Today's Vitals   08/19/19 1300 08/19/19 1330 08/19/19 1400 08/19/19 1430  BP: (!) 119/94 108/68 103/77 102/69  Pulse: (!) 108 (!) 111 (!) 105 (!) 108  Resp: 17 16 19 18   Temp:      TempSrc:      SpO2: 96% 97% 96% 96%  Weight:      Height:      PainSc:        Isolation Precautions No active isolations  Medications Medications  sodium chloride 0.9 % bolus 1,000 mL (1,000 mLs Intravenous New Bag/Given (Non-Interop) 08/19/19 1232)  cefTRIAXone (ROCEPHIN) 2 g in sodium chloride 0.9 % 100 mL IVPB (0 g Intravenous Stopped 08/19/19 1338)    Mobility walks

## 2019-08-19 NOTE — ED Notes (Signed)
Called phlebotomy for 2nd lactic draw

## 2019-08-19 NOTE — Progress Notes (Signed)
Notified bedside nurse of need to draw repeat lactic acid. 

## 2019-08-19 NOTE — ED Notes (Signed)
Date and time results received: 08/19/19 1339  Test: Lactic Acid Critical Value: 2.5  Name of Provider Notified: [place] MD

## 2019-08-19 NOTE — Progress Notes (Signed)
Notified provider of need to order repeat lactic acid.  2nd lactic resulting higher than 1st. Have asked Md to consider ordering a repeat.

## 2019-08-19 NOTE — Progress Notes (Signed)
[facility] [phone]   [phone]  OFFICE PROGRESS NOTE  Antony Blackbird, MD [place] [address]  DIAGNOSIS: Stage IIIb (T3, M2, M0) non-small cell lung cancer, adenocarcinoma presented with right hilar mass with occlusion of the right middle lobe bronchus centrally with right middle lobe collapse diagnosed in January 2021.   PRIOR THERAPY: None  CURRENT THERAPY: Concurrent chemoradiation with weekly carboplatin for AUC of 2 and paclitaxel 45 mg/M2.  First dose expected on July 09, 2019.  Status post 6 cycles.  INTERVAL HISTORY: Janice Adams 64 y.o. female returns to the clinic today for follow-up visit.  The patient will today complaining of fatigue and weakness as well as burning sensation in her urine.  Her heart rate is very high up to 142 today.  The patient denied having any current fever or chills.  She has no chest pain, shortness of breath, cough or hemoptysis.  She denied having any recent weight loss or night sweats.  She has no nausea, vomiting, diarrhea or constipation.  She was evaluated by her primary care physician 2 weeks ago for similar urinary tract infection and received treatment at that time.  She has no improvement in her condition and continues to have the burning sensation.  She was here today for evaluation before starting cycle #7 of her chemotherapy.   MEDICAL HISTORY: Past Medical History:  Diagnosis Date  . Back pain   . GERD (gastroesophageal reflux disease)   . Gout    no current problems per patient 05/31/19  . History of epigastric pain    comes and goes  . Hypertension    no meds    ALLERGIES:  has No Known Allergies.  MEDICATIONS:  Current Outpatient Medications  Medication Sig Dispense Refill  . naproxen sodium (ALEVE) 220 MG tablet Take 440 mg by mouth 2 (two) times daily as needed (back pain.).    Marland Kitchen omeprazole (PRILOSEC) 20 MG capsule Take 1 capsule (20 mg total) by  mouth 2 (two) times daily before a meal. To lower stomach acid 60 capsule 5  . prochlorperazine (COMPAZINE) 10 MG tablet Take 1 tablet (10 mg total) by mouth every 6 (six) hours as needed for nausea or vomiting. 30 tablet 1  . varenicline (CHANTIX PAK) 0.5 MG X 11 & 1 MG X 42 tablet Take one 0.5 mg tablet by mouth once daily for 3 days, then increase to one 0.5 mg tablet twice daily for 4 days, then increase to one 1 mg tablet twice daily. 53 tablet 0   No current facility-administered medications for this visit.    SURGICAL HISTORY:  Past Surgical History:  Procedure Laterality Date  . MULTIPLE TOOTH EXTRACTIONS    . TONSILLECTOMY    . uterine ablation    . VIDEO BRONCHOSCOPY WITH ENDOBRONCHIAL ULTRASOUND N/A 06/04/2019   Procedure: VIDEO BRONCHOSCOPY WITH ENDOBRONCHIAL ULTRASOUND;  Surgeon: Margaretha Seeds, MD;  Location: [place];  Service: Thoracic;  Laterality: N/A;  . WISDOM TOOTH EXTRACTION      REVIEW OF SYSTEMS:  Constitutional: positive for fatigue Eyes: negative Ears, nose, mouth, throat, and face: negative Respiratory: negative Cardiovascular: negative Gastrointestinal: negative Genitourinary:positive for dysuria and hematuria Integument/breast: negative Hematologic/lymphatic: negative Musculoskeletal:negative Neurological: negative Behavioral/Psych: negative Endocrine: negative Allergic/Immunologic: negative   PHYSICAL EXAMINATION: General appearance: alert, cooperative, fatigued and no distress Head: Normocephalic, without obvious abnormality, atraumatic Neck: no adenopathy, no JVD, supple, symmetrical, trachea midline and thyroid not enlarged, symmetric,  no tenderness/mass/nodules Lymph nodes: Cervical, supraclavicular, and axillary nodes normal. Resp: clear to auscultation bilaterally Back: symmetric, no curvature. ROM normal. No CVA tenderness. Cardio: regular rate and rhythm, S1, S2 normal, no murmur, click, rub or gallop GI: soft, non-tender; bowel sounds  normal; no masses,  no organomegaly Extremities: extremities normal, atraumatic, no cyanosis or edema Neurologic: Alert and oriented X 3, normal strength and tone. Normal symmetric reflexes. Normal coordination and gait  ECOG PERFORMANCE STATUS: 1 - Symptomatic but completely ambulatory  Blood pressure (!) 107/92, pulse (!) 142, temperature 98.3 F (36.8 C), temperature source Temporal, resp. rate 18, height 5\' 4"  (1.626 m), weight 189 lb (85.7 kg), SpO2 97 %.  LABORATORY DATA: Lab Results  Component Value Date   WBC 1.3 (L) 08/19/2019   HGB 13.0 08/19/2019   HCT 38.0 08/19/2019   MCV 96.0 08/19/2019   PLT 155 08/19/2019      Chemistry      Component Value Date/Time   NA 137 08/19/2019 1001   NA 146 (H) 03/06/2018 1128   K 4.4 08/19/2019 1001   CL 101 08/19/2019 1001   CO2 22 08/19/2019 1001   BUN 16 08/19/2019 1001   BUN 8 03/06/2018 1128   CREATININE 1.07 (H) 08/19/2019 1001   CREATININE 0.85 05/11/2016 1157      Component Value Date/Time   CALCIUM 10.0 08/19/2019 1001   ALKPHOS 97 08/19/2019 1001   AST 19 08/19/2019 1001   ALT 19 08/19/2019 1001   BILITOT 1.1 08/19/2019 1001       RADIOGRAPHIC STUDIES: No results found.  ASSESSMENT AND PLAN: This is a very pleasant 64 years old African-American female diagnosed with stage IIIb (T3, N2, M0) non-small cell lung cancer, adenocarcinoma presented with right hilar mass with occlusion of the right middle lobe bronchus as well as right middle lobe collapse in addition to right lower lobe pulmonary nodule diagnosed in January 2021. She is currently undergoing a course of concurrent chemoradiation with weekly carboplatin and paclitaxel status post 6 cycles.   The patient has been tolerating her treatment well with no concerning adverse effects except for fatigue. She had recurrent urinary tract infection and she has a lot of burning sensation and mild hematuria.  She was treated by her primary care physician for the infection  with no improvement. The patient had persistent tachycardia of unclear etiology.  Repeat EKG showed similar results. I recommended for the patient to hold her systemic chemotherapy today. We will send her to the emergency department for evaluation of her tachycardia and also rule out urosepsis. The patient will come back for follow-up visit in 2 weeks for evaluation. She was advised to call immediately if she has any other concerning symptoms in the interval. The patient voices understanding of current disease status and treatment options and is in agreement with the current care plan.  All questions were answered. The patient knows to call the clinic with any problems, questions or concerns. We can certainly see the patient much sooner if necessary.  Disclaimer: This note was dictated with voice recognition software. Similar sounding words can inadvertently be transcribed and may not be corrected upon review.

## 2019-08-19 NOTE — ED Triage Notes (Addendum)
Pt from [place] for tachycardia without chest pain; was to receive treatment for hx of lung cancer; left flank pain with associated urinary symptoms [facility]   Urine was collected and sent at [facility].

## 2019-08-19 NOTE — Progress Notes (Signed)
CRITICAL VALUE STICKER  CRITICAL VALUE: Lactic Acid 3.1  RECEIVER (on-site recipient of call): Garlon Hatchet, LPN  DATE & TIME NOTIFIED:  08/19/19 at 1721  MESSENGER (representative from lab): Mickel Baas   MD NOTIFIED: [place]  TIME OF NOTIFICATION: 08/19/19 1722

## 2019-08-19 NOTE — ED Provider Notes (Signed)
[facility] DEPT Provider Note   CSN: [id number] Arrival date & time: 08/19/19  1128     History Chief Complaint  Patient presents with  . Tachycardia  . Dysuria    Janice Adams is a 64 y.o. female.  The history is provided by the patient.  Dysuria Pain quality:  Burning Pain severity:  Mild Onset quality:  Gradual Timing:  Constant Progression:  Unchanged Chronicity:  New Recent urinary tract infections: yes   Relieved by:  Nothing Worsened by:  Nothing Urinary symptoms: frequent urination   Urinary symptoms: no discolored urine and no foul-smelling urine   Associated symptoms: flank pain (left) and nausea   Associated symptoms: no abdominal pain, no fever and no vomiting   Risk factors comment:  Hx of lung cancer on immunotherapy and radiation      Past Medical History:  Diagnosis Date  . Back pain   . GERD (gastroesophageal reflux disease)   . Gout    no current problems per patient 05/31/19  . History of epigastric pain    comes and goes  . Hypertension    no meds    Patient Active Problem List   Diagnosis Date Noted  . UTI (urinary tract infection) 08/19/2019  . SVT (supraventricular tachycardia) ([place]) 08/19/2019  . Encounter for antineoplastic chemotherapy 07/09/2019  . Goals of care, counseling/discussion 07/09/2019  . 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 Mass of right lung 05/30/2019  . GERD (gastroesophageal reflux disease) 11/08/2012  . Smoker 11/08/2012    Past Surgical History:  Procedure Laterality Date  . MULTIPLE TOOTH EXTRACTIONS    . TONSILLECTOMY    . uterine ablation    . VIDEO BRONCHOSCOPY WITH ENDOBRONCHIAL ULTRASOUND N/A 06/04/2019   Procedure: VIDEO BRONCHOSCOPY WITH ENDOBRONCHIAL ULTRASOUND;  Surgeon: Margaretha Seeds, MD;  Location: [facility];  Service: Thoracic;  Laterality: N/A;  . WISDOM TOOTH EXTRACTION       OB History   No obstetric history on  file.     Family History  Problem Relation Age of Onset  . Breast cancer Mother   . Cancer Mother   . Seizures Father   . Breast cancer Maternal Grandmother   . Breast cancer Daughter   . Cancer Daughter   . Heart attack Brother     Social History   Tobacco Use  . Smoking status: Former Smoker    Packs/day: 0.50    Years: 47.00    Pack years: 23.50    Types: Cigarettes    Quit date: 07/07/2019    Years since quitting: 0.1  . Smokeless tobacco: Never Used  Substance Use Topics  . Alcohol use: No    Alcohol/week: 0.0 standard drinks  . Drug use: No    Types: Marijuana    Comment: last use Wed 05/28/19    Home Medications Prior to Admission medications   Medication Sig Start Date End Date Taking? Authorizing Provider  naproxen sodium (ALEVE) 220 MG tablet Take 440 mg by mouth 2 (two) times daily as needed (back pain.).   Yes [provider]  omeprazole (PRILOSEC) 20 MG capsule Take 1 capsule (20 mg total) by mouth 2 (two) times daily before a meal. To lower stomach acid 03/15/19  Yes Fulp, Cammie, MD  prochlorperazine (COMPAZINE) 10 MG tablet Take 1 tablet (10 mg total) by mouth every 6 (six) hours as needed for nausea or vomiting. 07/29/19  Yes Curt Bears, MD  Allergies    Patient has no known allergies.  Review of Systems   Review of Systems  Constitutional: Positive for fatigue. Negative for chills and fever.  HENT: Negative for ear pain and sore throat.   Eyes: Negative for pain and visual disturbance.  Respiratory: Negative for cough and shortness of breath.   Cardiovascular: Negative for chest pain and palpitations.  Gastrointestinal: Positive for nausea. Negative for abdominal pain and vomiting.  Genitourinary: Positive for dysuria and flank pain (left). Negative for hematuria.  Musculoskeletal: Negative for arthralgias and back pain.  Skin: Negative for color change and rash.  Neurological: Negative for seizures and syncope.  All other  systems reviewed and are negative.   Physical Exam Updated Vital Signs  ED Triage Vitals [08/19/19 1134]  Enc Vitals Group     BP 108/89     Pulse Rate (!) 117     Resp 18     Temp 97.7 F (36.5 C)     Temp Source Oral     SpO2 99 %     Weight 184 lb (83.5 kg)     Height 5\' 4"  (1.626 m)     Head Circumference      Peak Flow      Pain Score 10     Pain Loc      Pain Edu?      Excl. in [place]?     Physical Exam Vitals and nursing note reviewed.  Constitutional:      General: She is not in acute distress.    Appearance: She is well-developed. She is not ill-appearing.  HENT:     Head: Normocephalic and atraumatic.     Nose: Nose normal.     Mouth/Throat:     Mouth: Mucous membranes are moist.  Eyes:     Extraocular Movements: Extraocular movements intact.     Conjunctiva/sclera: Conjunctivae normal.     Pupils: Pupils are equal, round, and reactive to light.  Cardiovascular:     Rate and Rhythm: Regular rhythm. Tachycardia present.     Pulses: Normal pulses.     Heart sounds: Normal heart sounds. No murmur.  Pulmonary:     Effort: Pulmonary effort is normal. No respiratory distress.     Breath sounds: Normal breath sounds.  Abdominal:     Palpations: Abdomen is soft.     Tenderness: There is no abdominal tenderness. There is left CVA tenderness.  Musculoskeletal:     Cervical back: Neck supple. No tenderness.     Right lower leg: No edema.     Left lower leg: No edema.  Skin:    General: Skin is warm and dry.  Neurological:     Mental Status: She is alert.     ED Results / Procedures / Treatments   Labs (all labs ordered are listed, but only abnormal results are displayed) Labs Reviewed  URINALYSIS, ROUTINE W REFLEX MICROSCOPIC - Abnormal; Notable for the following components:      Result Value   APPearance HAZY (*)    Hgb urine dipstick SMALL (*)    Protein, ur 30 (*)    Leukocytes,Ua SMALL (*)    WBC, UA >50 (*)    Bacteria, UA RARE (*)    All other  components within normal limits  LACTIC ACID, PLASMA - Abnormal; Notable for the following components:   Lactic Acid, Venous 2.5 (*)    All other components within normal limits  CULTURE, BLOOD (ROUTINE X 2)  CULTURE, BLOOD (  ROUTINE X 2)  URINE CULTURE  SARS CORONAVIRUS 2 (TAT 6-24 HRS)  LACTIC ACID, PLASMA    EKG None  Radiology DG Chest Portable 1 View  Result Date: 08/19/2019 CLINICAL DATA:  Tachycardia. EXAM: PORTABLE CHEST 1 VIEW COMPARISON:  Chest x-ray dated June 04, 2019. FINDINGS: Grossly unchanged right infrahilar mass with associated right middle lobe collapse. Normal heart size. Atherosclerotic calcification of the aortic arch. Normal pulmonary vascularity. No pleural effusion or pneumothorax. No acute osseous abnormality. IMPRESSION: 1. Grossly unchanged right infrahilar mass with associated right middle lobe collapse. Electronically Signed   By: Titus Dubin M.D.   On: 08/19/2019 13:07    Procedures .Critical Care Performed by: Lennice Sites, DO Authorized by: Lennice Sites, DO   Critical care provider statement:    Critical care time (minutes):  35   Critical care was necessary to treat or prevent imminent or life-threatening deterioration of the following conditions:  Sepsis   Critical care was time spent personally by me on the following activities:  Blood draw for specimens, development of treatment plan with patient or surrogate, discussions with primary provider, evaluation of patient's response to treatment, examination of patient, obtaining history from patient or surrogate, ordering and performing treatments and interventions, ordering and review of laboratory studies, ordering and review of radiographic studies, pulse oximetry, re-evaluation of patient's condition and review of old charts   I assumed direction of critical care for this patient from another provider in my specialty: no     (including critical care time)  Medications Ordered in  ED Medications  sodium chloride 0.9 % bolus 1,000 mL (1,000 mLs Intravenous New Bag/Given (Non-Interop) 08/19/19 1232)  cefTRIAXone (ROCEPHIN) 2 g in sodium chloride 0.9 % 100 mL IVPB (0 g Intravenous Stopped 08/19/19 1338)    ED Course  I have reviewed the triage vital signs and the nursing notes.  Pertinent labs & imaging results that were available during my care of the patient were reviewed by me and considered in my medical decision making (see chart for details).    MDM Rules/Calculators/A&P                      Darice Vicario is a 64 year old female with history of lung cancer undergoing immunotherapy and radiation who presents to the ED with dysuria, flank pain.  Was tachycardic and cancer clinic today and sent for evaluation.  Tachycardia mildly improved upon arrival.  Heart rate was 140s at the cancer center but 117 here.  She states that she just finished antibiotics last week for urinary tract infection.  However has ongoing dysuria, now left flank pain, intermittent nausea.  CBC and CMP were drawn at cancer clinic.  Patient appears to be leukopenic/neutropenic on lab work.  We will empirically give IV Rocephin assuming UTI/pyelonephritis.  Otherwise she is well-appearing.  Denies any respiratory symptoms.  Anticipate admission after getting urinalysis, will give IV fluids.  Will screen for Covid.  No fever.  Concern for sepsis.  Urinalysis shows small leukocytes, many white blood cells.  Concerning for infection given her symptoms as well.  Lactic acid elevated at 2.5.  Tachycardia improving with IV fluids.  Given leukopenia, lactic acidosis, tachycardia, likely UTI concern for sepsis.  Will admit for further IV fluids and IV antibiotics.  This chart was dictated using voice recognition software.  Despite best efforts to proofread,  errors can occur which can change the documentation meaning.   Final Clinical Impression(s) / ED Diagnoses Final diagnoses:  Acute cystitis without  hematuria  Sepsis, due to unspecified organism, unspecified whether acute organ dysfunction present [facility])    Rx / DC Orders ED Discharge Orders    None       Lennice Sites, DO 08/19/19 1405

## 2019-08-20 ENCOUNTER — Telehealth: Payer: Self-pay | Admitting: Internal Medicine

## 2019-08-20 ENCOUNTER — Ambulatory Visit
Admission: RE | Admit: 2019-08-20 | Discharge: 2019-08-20 | Disposition: A | Discharge status: Home / Self Care | Payer: Medicaid Other | Source: Ambulatory Visit | Attending: Radiation Oncology | Admitting: Radiation Oncology

## 2019-08-20 LAB — HIV ANTIBODY (ROUTINE TESTING W REFLEX): HIV Screen 4th Generation wRfx: NONREACTIVE

## 2019-08-20 LAB — URINE CULTURE
Culture: <10000 — AB
Culture: <10000 — AB

## 2019-08-20 LAB — COMPREHENSIVE METABOLIC PANEL
ALT: 18 U/L (ref 0–44)
AST: 19 U/L (ref 15–41)
Albumin: 3.3 g/dL — ABNORMAL LOW (ref 3.5–5.0)
Alkaline Phosphatase: 63 U/L (ref 38–126)
Anion gap: 9 (ref 5–15)
BUN: 14 mg/dL (ref 8–23)
CO2: 23 mmol/L (ref 22–32)
Calcium: 8.9 mg/dL (ref 8.9–10.3)
Chloride: 107 mmol/L (ref 98–111)
Creatinine, Ser: 0.79 mg/dL (ref 0.44–1.00)
GFR calc Af Amer: >60 mL/min (ref >60)
GFR calc non Af Amer: >60 mL/min (ref >60)
Glucose, Bld: 101 mg/dL — ABNORMAL HIGH (ref 70–99)
Potassium: 4.3 mmol/L (ref 3.5–5.1)
Sodium: 139 mmol/L (ref 135–145)
Total Bilirubin: 0.9 mg/dL (ref 0.3–1.2)
Total Protein: 6.3 g/dL — ABNORMAL LOW (ref 6.5–8.1)

## 2019-08-20 LAB — CBC
HCT: 30.1 % — ABNORMAL LOW (ref 36.0–46.0)
Hemoglobin: 10.1 g/dL — ABNORMAL LOW (ref 12.0–15.0)
MCH: 33.2 pg (ref 26.0–34.0)
MCHC: 33.6 g/dL (ref 30.0–36.0)
MCV: 99 fL (ref 80.0–100.0)
Platelets: 128 10*3/uL — ABNORMAL LOW (ref 150–400)
RBC: 3.04 MIL/uL — ABNORMAL LOW (ref 3.87–5.11)
RDW: 14.7 % (ref 11.5–15.5)
WBC: 0.9 10*3/uL — CL (ref 4.0–10.5)
nRBC: 0 % (ref 0.0–0.2)

## 2019-08-20 MED ORDER — PHENAZOPYRIDINE HCL 100 MG PO TABS
100.0000 mg | ORAL_TABLET | Freq: Three times a day (TID) | ORAL | Status: DC
  Administered 2019-08-20 – 2019-08-21 (×3): 100 mg via ORAL
  Filled 2019-08-20 (×4): qty 1

## 2019-08-20 NOTE — Progress Notes (Signed)
PT Cancellation Note / Screen  Patient Details Name: Janice Adams MRN: [id number] DOB: 06-14-1955   Cancelled Treatment:    Reason Eval/Treat Not Completed: PT screened, no needs identified, will sign off  OT screened for PT.  OT reports no PT needs at this time.  Sherrod Toothman,KATHrine E 08/20/2019, 8:32 AM Jannette Spanner PT, DPT Acute Rehabilitation Services Office: [phone]

## 2019-08-20 NOTE — Evaluation (Signed)
Occupational Therapy Evaluation and Discharge Patient Details Name: Janice Adams MRN: [id number] DOB: 1955-06-15 Today's Date: 08/20/2019    History of Present Illness Janice Adams is a 64 y.o. 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 Pt found to have possible sepsis likely secondary to UTI   Clinical Impression   This 64 yo female admitted with above presents to acute OT with PLOF independent with basic ADLs, IADLs, and mobility and currently is the same. No further acute OT needs and no PT needs identified--made PT aware. OT will sign off.    Follow Up Recommendations  No OT follow up    Equipment Recommendations  None recommended by OT       Precautions / Restrictions Precautions Precautions: None Restrictions Weight Bearing Restrictions: No      Mobility Bed Mobility Overal bed mobility: Independent                Transfers Overall transfer level: Independent               General transfer comment: Mod I up and down steps using one rail    Balance Overall balance assessment: No apparent balance deficits (not formally assessed)                                         ADL either performed or assessed with clinical judgement   ADL Overall ADL's : Independent                                             Vision Patient Visual Report: No change from baseline              Pertinent Vitals/Pain Pain Assessment: No/denies pain     Hand Dominance Right   Extremity/Trunk Assessment Upper Extremity Assessment Upper Extremity Assessment: Overall WFL for tasks assessed   Lower Extremity Assessment Lower Extremity Assessment: Overall WFL for tasks assessed       Communication Communication Communication: No difficulties   Cognition Arousal/Alertness: Awake/alert Behavior During Therapy: WFL for tasks assessed/performed Overall  Cognitive Status: Within Functional Limits for tasks assessed                                                Home Living Family/patient expects to be discharged to:: Private residence Living Arrangements: Other (Comment)(grandchildren 12 and soon to be 16) Available Help at Discharge: Family;Available PRN/intermittently(grandchildren in school) Type of Home: House Home Access: Stairs to enter [employer] of Steps: 5 Entrance Stairs-Rails: Right;Left;Can reach both Home Layout: One level     Bathroom Shower/Tub: Corporate investment banker: Standard     Home Equipment: None          Prior Functioning/Environment Level of Independence: Independent                          OT Goals(Current goals can be found in the care plan section) Acute Rehab OT Goals Patient Stated Goal: to go home  OT Frequency:  AM-PAC OT "6 Clicks" Daily Activity     Outcome Measure Help from another person eating meals?: None Help from another person taking care of personal grooming?: None Help from another person toileting, which includes using toliet, bedpan, or urinal?: None Help from another person bathing (including washing, rinsing, drying)?: None Help from another person to put on and taking off regular upper body clothing?: None Help from another person to put on and taking off regular lower body clothing?: None 6 Click Score: 24   End of Session Nurse Communication: Mobility status(safe to be up by herself)  Activity Tolerance: Patient tolerated treatment well Patient left: in chair;with call bell/phone within reach  OT Visit Diagnosis: Muscle weakness (generalized) (M62.81)                Time: 0812-0829 OT Time Calculation (min): 17 min Charges:  OT General Charges $OT Visit: 1 Visit OT Evaluation $OT Eval Low Complexity: 1 Low  [place], OTR/L Acute [employer] Pager [phone] Office  [phone]     Almon Register 08/20/2019, 8:35 AM

## 2019-08-20 NOTE — Progress Notes (Addendum)
PROGRESS NOTE  Janice Adams FOY:[id number] DOB: March 30, 1956 DOA: 08/19/2019 PCP: Antony Blackbird, MD   LOS: 1 day   Brief narrative: As per HPI,  Janice Adams is a 64 y.o. female with past medical history of stage IIIb with right lung cancer with  occlusion of the right middle bronchus on concurrent chemoradiation with carboplatin and paclitaxel, was seen at the oncology clinic for regular follow-up.  Patient complained of  fatigue and weakness and had been having some burning urination.  Patient was recently evaluated by her primary care was treated for UTI but did not have complete resolution of her symptoms and persisted to have burning sensation.  In the clinic, the patient was also noted to be tachycardic with a heart rate up to 142's and was sent to the emergency department to assess for possible sepsis.  ED Course: In the ED, patient was noted to be mildly tachycardic.  She was afebrile.  CBC done from the clinic today showed a WBC  1.3, creatinine of 1.07.    Creatinine at baseline around 0.8.  Urinalysis was significant  for white cells and leukocytes.  Lactate was elevated at 2.5.  Blood cultures and urine cultures were sent from the ED.  COVID-19 test was sent from the ED as well. In the ED patient received IV fluid bolus, IV Rocephin and was considered for admission to the hospital.  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  Patient was tachycardic,tachypneic with leukopenia and elevated lactate at 2.5 on presentation.  Lactate worsened initially but improved with IV hydration.  At this time it has normalized, lactate at 1.2.. Currently on chemotherapy.  Received IV fluid bolus and hydration with improvement in tachycardia.  No fever.  Continue Rocephin IV.  Follow blood cultures urine  cultures.   Blood cultures negative so far.  Urine culture has been repeated.  COVID-19 negative.  Neutropenia of 0.9 today.  Stage IIIb lung cancer currently on concurrent chemoradiation.  Immunosuppressed status.   Continue supportive care including oxygen, nebulizer as necessary.  VTE Prophylaxis: Lovenox subcu  Code Status: Full code  Family Communication: None  Disposition Plan:  . Patient is from home . Likely disposition to home by tomorrow.  Patient has been seen by physical therapy and recommended no skilled therapy needs. . Barriers to discharge: Sepsis with fever, neutropenia, follow blood cultures, urine cultures   Consultants:  None  Procedures:  None  Antibiotics:  . Rocephin IV  Anti-infectives (From admission, onward)   Start     Dose/Rate Route Frequency Ordered Stop   08/20/19 1000  cefTRIAXone (ROCEPHIN) 1 g in sodium chloride 0.9 % 100 mL IVPB     1 g 200 mL/hr over 30 Minutes Intravenous Every 24 hours 08/19/19 1615     08/19/19 1200  cefTRIAXone (ROCEPHIN) 2 g in sodium chloride 0.9 % 100 mL IVPB     2 g 200 mL/hr over 30 Minutes Intravenous  Once 08/19/19 1148 08/19/19 1338      Subjective:  Today, patient was seen and examined at bedside.  Patient still complains of dysuria and some tingling sensation over the perineal area.  Denies any shortness of breath, cough fever.  Objective: Vitals:   08/20/19 0044 08/20/19 0407  BP: 95/60 109/73  Pulse: (!) 101 96  Resp:  18 20  Temp: 98.3 F (36.8 C) 98.3 F (36.8 C)  SpO2: 95% 96%    Intake/Output Summary (Last 24 hours) at 08/20/2019 1156 Last data filed at 08/20/2019 0300 Gross per 24 hour  Intake 878.25 ml  Output --  Net 878.25 ml   Filed Weights   08/19/19 1134  Weight: 83.5 kg   Body mass index is 31.58 kg/m.   Physical Exam: GENERAL: Patient is alert awake and oriented. Not in obvious distress.  Obese HENT: No scleral pallor or icterus. Pupils equally reactive to light. Oral  mucosa is moist NECK: is supple, no gross swelling noted. CHEST: Clear to auscultation. No crackles or wheezes.  Diminished breath sounds bilaterally. CVS: S1 and S2 heard, no murmur. Regular rate and rhythm.  ABDOMEN: Soft, mild suprapubic discomfort,, bowel sounds are present. EXTREMITIES: No edema. CNS: Cranial nerves are intact. No focal motor deficits. SKIN: warm and dry without rashes.  Data Review: I have personally reviewed the following laboratory data and studies,  CBC: Recent Labs  Lab 08/19/19 1001 08/20/19 0615  WBC 1.3* 0.9*  NEUTROABS 0.6*  --   HGB 13.0 10.1*  HCT 38.0 30.1*  MCV 96.0 99.0  PLT 155 676*   Basic Metabolic Panel: Recent Labs  Lab 08/19/19 1001 08/20/19 0615  NA 137 139  K 4.4 4.3  CL 101 107  CO2 22 23  GLUCOSE 120* 101*  BUN 16 14  CREATININE 1.07* 0.79  CALCIUM 10.0 8.9   Liver Function Tests: Recent Labs  Lab 08/19/19 1001 08/20/19 0615  AST 19 19  ALT 19 18  ALKPHOS 97 63  BILITOT 1.1 0.9  PROT 8.5* 6.3*  ALBUMIN 4.2 3.3*   No results for input(s): LIPASE, AMYLASE in the last 168 hours. No results for input(s): AMMONIA in the last 168 hours. Cardiac Enzymes: No results for input(s): CKTOTAL, CKMB, CKMBINDEX, TROPONINI in the last 168 hours. BNP (last 3 results) No results for input(s): BNP in the last 8760 hours.  ProBNP (last 3 results) No results for input(s): PROBNP in the last 8760 hours.  CBG: No results for input(s): GLUCAP in the last 168 hours. Recent Results (from the past 240 hour(s))  Urine culture     Status: Abnormal   Collection Time: 08/19/19 10:02 AM   Specimen: Urine, Clean Catch  Result Value Ref Range Status   Specimen Description   Final    URINE, CLEAN CATCH Performed at [facility], [address] [place], [place] [address]    Special Requests   Final    NONE Performed at [facility], [place] [address] [place], [place] [address]     Culture (A)  Final    <10,000 COLONIES/mL INSIGNIFICANT GROWTH Performed at [place] [address], [place], [place] [address]    Report Status 08/20/2019 FINAL  Final  Blood culture (routine x 2)     Status: None (Preliminary result)   Collection Time: 08/19/19 11:48 AM   Specimen: BLOOD  Result Value Ref Range Status   Specimen Description   Final    BLOOD BLOOD LEFT HAND Performed at [place] [address], [place], [place] [address]    Special Requests   Final    BOTTLES DRAWN AEROBIC ONLY Blood Culture adequate volume Performed at [place] [address], [place], [place] [address]    Culture   Final    NO GROWTH < 24 HOURS  Performed at [facility], [place] [address] [place], [place] [address]    Report Status PENDING  Incomplete  Blood culture (routine x 2)     Status: None (Preliminary result)   Collection Time: 08/19/19 11:53 AM   Specimen: BLOOD  Result Value Ref Range Status   Specimen Description   Final    BLOOD RIGHT ANTECUBITAL Performed at [place] [address], [place], [place] [address]    Special Requests   Final    BOTTLES DRAWN AEROBIC ONLY Blood Culture adequate volume Performed at [place] [address], [place], [place] [address]    Culture   Final    NO GROWTH < 24 HOURS Performed at [facility] [address] [place], [place] [address]    Report Status PENDING  Incomplete  SARS CORONAVIRUS 2 (TAT 6-24 HRS) Nasopharyngeal Nasopharyngeal Swab     Status: None   Collection Time: 08/19/19 12:28 PM   Specimen: Nasopharyngeal Swab  Result Value Ref Range Status   SARS Coronavirus 2 NEGATIVE NEGATIVE Final    Comment: (NOTE) SARS-CoV-2 target nucleic acids are NOT DETECTED. The SARS-CoV-2 RNA is generally detectable in upper and lower respiratory specimens during the acute phase of infection. Negative results do not preclude  SARS-CoV-2 infection, do not rule out co-infections with other pathogens, and should not be used as the sole basis for treatment or other patient management decisions. Negative results must be combined with clinical observations, patient history, and epidemiological information. The expected result is Negative. Fact Sheet for Patients: SugarRoll.be Fact Sheet for Healthcare Providers: https://www.woods-mathews.com/ This test is not yet approved or cleared by the [place] FDA and  has been authorized for detection and/or diagnosis of SARS-CoV-2 by FDA under an Emergency Use Authorization (EUA). This EUA will remain  in effect (meaning this test can be used) for the duration of the COVID-19 declaration under Section 56 4(b)(1) of the Act, 21 U.S.C. section 360bbb-3(b)(1), unless the authorization is terminated or revoked sooner. Performed at [facility], [place] [address], [place], [place] [address]      Studies: DG Chest Portable 1 View  Result Date: 08/19/2019 CLINICAL DATA:  Tachycardia. EXAM: PORTABLE CHEST 1 VIEW COMPARISON:  Chest x-ray dated June 04, 2019. FINDINGS: Grossly unchanged right infrahilar mass with associated right middle lobe collapse. Normal heart size. Atherosclerotic calcification of the aortic arch. Normal pulmonary vascularity. No pleural effusion or pneumothorax. No acute osseous abnormality. IMPRESSION: 1. Grossly unchanged right infrahilar mass with associated right middle lobe collapse. Electronically Signed   By: Titus Dubin M.D.   On: 08/19/2019 13:07      Flora Lipps, MD  [facility] 08/20/2019

## 2019-08-20 NOTE — Plan of Care (Signed)
  Problem: Coping: Goal: Level of anxiety will decrease 08/20/2019 0332 by Mickie Kay, RN Outcome: Progressing 08/20/2019 0332 by Mickie Kay, RN Outcome: Progressing

## 2019-08-20 NOTE — Telephone Encounter (Signed)
Scheduled per los. Called and left msg. Mailed printout  °

## 2019-08-20 NOTE — Progress Notes (Signed)
Pharmacist Chemotherapy Monitoring - Follow Up Assessment    I verify that I have reviewed each item in the below checklist:  . Regimen for the patient is scheduled for the appropriate day and plan matches scheduled date. Marland Kitchen Appropriate non-routine labs are ordered dependent on drug ordered. . If applicable, additional medications reviewed and ordered per protocol based on lifetime cumulative doses and/or treatment regimen.   Plan for follow-up and/or issues identified: Yes . I-vent associated with next due treatment: Yes . MD and/or nursing notified: No  Montie Gelardi D 08/20/2019 9:14 AM

## 2019-08-21 ENCOUNTER — Ambulatory Visit
Admission: RE | Admit: 2019-08-21 | Discharge: 2019-08-21 | Disposition: A | Discharge status: Home / Self Care | Payer: Medicaid Other | Source: Ambulatory Visit | Attending: Radiation Oncology | Admitting: Radiation Oncology

## 2019-08-21 LAB — BASIC METABOLIC PANEL
Anion gap: 9 (ref 5–15)
BUN: 11 mg/dL (ref 8–23)
CO2: 23 mmol/L (ref 22–32)
Calcium: 9.3 mg/dL (ref 8.9–10.3)
Chloride: 107 mmol/L (ref 98–111)
Creatinine, Ser: 0.73 mg/dL (ref 0.44–1.00)
GFR calc Af Amer: >60 mL/min (ref >60)
GFR calc non Af Amer: >60 mL/min (ref >60)
Glucose, Bld: 88 mg/dL (ref 70–99)
Potassium: 4.6 mmol/L (ref 3.5–5.1)
Sodium: 139 mmol/L (ref 135–145)

## 2019-08-21 LAB — CBC WITH DIFFERENTIAL/PLATELET
Abs Immature Granulocytes: 0.01 10*3/uL (ref 0.00–0.07)
Basophils Absolute: 0 10*3/uL (ref 0.0–0.1)
Basophils Relative: 1 %
Eosinophils Absolute: 0 10*3/uL (ref 0.0–0.5)
Eosinophils Relative: 0 %
HCT: 31.3 % — ABNORMAL LOW (ref 36.0–46.0)
Hemoglobin: 10.8 g/dL — ABNORMAL LOW (ref 12.0–15.0)
Immature Granulocytes: 1 %
Lymphocytes Relative: 45 %
Lymphs Abs: 0.4 10*3/uL — ABNORMAL LOW (ref 0.7–4.0)
MCH: 33.1 pg (ref 26.0–34.0)
MCHC: 34.5 g/dL (ref 30.0–36.0)
MCV: 96 fL (ref 80.0–100.0)
Monocytes Absolute: 0.1 10*3/uL (ref 0.1–1.0)
Monocytes Relative: 10 %
Neutro Abs: 0.4 10*3/uL — ABNORMAL LOW (ref 1.7–7.7)
Neutrophils Relative %: 43 %
Platelets: 142 10*3/uL — ABNORMAL LOW (ref 150–400)
RBC: 3.26 MIL/uL — ABNORMAL LOW (ref 3.87–5.11)
RDW: 14.6 % (ref 11.5–15.5)
WBC: 0.9 10*3/uL — CL (ref 4.0–10.5)
nRBC: 0 % (ref 0.0–0.2)

## 2019-08-21 LAB — MAGNESIUM: Magnesium: 1.6 mg/dL — ABNORMAL LOW (ref 1.7–2.4)

## 2019-08-21 MED ORDER — CIPROFLOXACIN HCL 500 MG PO TABS: 500.0000 mg | ORAL_TABLET | Freq: Two times a day (BID) | ORAL | 0 refills | Status: AC

## 2019-08-21 MED ORDER — TBO-FILGRASTIM 300 MCG/0.5ML ~~LOC~~ SOSY: 300.0000 ug | PREFILLED_SYRINGE | Freq: Once | SUBCUTANEOUS | Status: DC

## 2019-08-21 MED ORDER — PHENAZOPYRIDINE HCL 100 MG PO TABS: 100.0000 mg | ORAL_TABLET | Freq: Three times a day (TID) | ORAL | 0 refills | Status: DC

## 2019-08-21 MED ORDER — TBO-FILGRASTIM 300 MCG/0.5ML ~~LOC~~ SOSY
300.0000 ug | PREFILLED_SYRINGE | Freq: Once | SUBCUTANEOUS | Status: AC
  Administered 2019-08-21: 300 ug via SUBCUTANEOUS
  Filled 2019-08-21: qty 0.5

## 2019-08-21 NOTE — Progress Notes (Signed)
Tele noted pt in ST, placing pt in yellow mews. Pt has HR has been ST at times. VS rechecked, pt HR noted to be 100-108 on recheck. RN consulted with Agricultural consultant. MD is aware of ST, plan is discharge pt home today. Will continue to monitor pt.

## 2019-08-21 NOTE — Discharge Summary (Signed)
Physician Discharge Summary  Janice Adams DJT:[id number] DOB: 03-29-1956 DOA: 08/19/2019  PCP: Antony Blackbird, MD  Admit date: 08/19/2019 Discharge date: 08/21/2019  Admitted From: Home  Discharge disposition: Home   Recommendations for Outpatient Follow-Up:   . Follow up with your primary care provider in one week.  . Check CBC, BMP in the next visit. . Follow-up with your oncologist as scheduled by the clinic.   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  Diet recommendation:   Regular.  Wound care: None.  Code status: Full.   History of Present Illness:   Janice Adams a 64 y.o.femalewith past medical history of stage IIIb with rightlung cancer withocclusion of the right middle bronchus on concurrent chemoradiationwith carboplatin and paclitaxel,was seen at the oncology clinic for regular follow-up. Patient complained of fatigue andweakness and hadbeen having some burning urination. Patient was recently evaluated by her primary care was treated for UTI but did not have complete resolution of her symptoms and persisted to have burning sensation.In the clinic, the patientwas also noted to be tachycardic with a heart rate up to142'sand was sent to the emergency department to assess for possible sepsis.  ED Course:In the ED, patient was noted to be mildly tachycardic. She was afebrile. CBC done from the clinic today showed a WBC  1.3, creatinine of1.07.   Creatinine at baseline around 0.8. Urinalysis was significantforwhite cells and leukocytes. Lactate was elevated at 2.5. Blood cultures and urine cultures were sent from the ED. COVID-19 test was sent from the ED as well.In the ED patient received IV fluid bolus, IV Rocephin and was considered for admission  to the hospital.   Hospital Course:   Following conditions were addressed during hospitalization as listed below,  sepsis likely secondary to UTI. Patient was tachycardic,tachypneic with leukopeniaand elevated lactate at 2.5 on presentation.  Lactate worsened initially but improved with IV hydration.    Continued IV fluid hydration and antibiotic lactate normalized to 1.2.  Patient is currently on chemotherapy as outpatient. No fever. She was treated with IV Rocephin for possible UTI.  Will be continued on oral ciprofloxacin on discharge.  At this time, patient has significantly felt better and wishes to go home.  Blood culture and urine culture been negative so far.  COVID-19 negative.  Neutropenia of 0.9 today.  Spoke with Dr. Julien Nordmann oncology and recommended Granix prior to discharge.  Patient has received Granix prior to discharge.  Stage IIIb lung cancer currently on concurrent chemoradiation. Immunosuppressed status. Patient did not have any pulmonary symptoms.  Remained compensated.  Disposition.  At this time, patient is stable for disposition home.  Patient was given 3 more days of ciprofloxacin on discharge to complete the course  Medical Consultants:    Verbal consultation with oncology  Procedures:    None Subjective:   Today, patient is better.  Wishes to go home.  No fever, chills or rigor.  Denies abdominal pain, nausea vomiting.  Has mild dysuria  Discharge Exam:   Vitals:   08/21/19 0957 08/21/19 1443  BP:  111/82  Pulse: (!) 108 (!) 103  Resp:    Temp:  97.9 F (36.6 C)  SpO2:  98%   Vitals:   08/21/19 0501 08/21/19 0851 08/21/19 0957 08/21/19 1443  BP: 112/75 99/64  111/82  Pulse:  90 (!) 105 (!) 108 (!) 103  Resp: 17 18    Temp: 98.2 F (36.8 C) 97.7 F (36.5 C)  97.9 F (36.6 C)  TempSrc:    Oral  SpO2: 96% 97%  98%  Weight:      Height:        General: Alert awake, not in obvious distress HENT: pupils equally reacting to light,  No  scleral pallor or icterus noted. Oral mucosa is moist.  Chest:  Clear breath sounds.  Diminished breath sounds bilaterally. No crackles or wheezes.  CVS: S1 &S2 heard. No murmur.  Regular rate and rhythm. Abdomen: Soft, nontender, nondistended.  Bowel sounds are heard.   Extremities: No cyanosis, clubbing or edema.  Peripheral pulses are palpable. Psych: Alert, awake and oriented, normal mood CNS:  No cranial nerve deficits.  Power equal in all extremities.   Skin: Warm and dry.  No rashes noted.  The results of significant diagnostics from this hospitalization (including imaging, microbiology, ancillary and laboratory) are listed below for reference.     Diagnostic Studies:   No results found.   Labs:   Basic Metabolic Panel: Recent Labs  Lab 08/19/19 1001 08/19/19 1001 08/20/19 0615 08/21/19 0556  NA 137  --  139 139  K 4.4   < > 4.3 4.6  CL 101  --  107 107  CO2 22  --  23 23  GLUCOSE 120*  --  101* 88  BUN 16  --  14 11  CREATININE 1.07*  --  0.79 0.73  CALCIUM 10.0  --  8.9 9.3  MG  --   --   --  1.6*   < > = values in this interval not displayed.   GFR Estimated Creatinine Clearance: 74.2 mL/min (by C-G formula based on SCr of 0.73 mg/dL). Liver Function Tests: Recent Labs  Lab 08/19/19 1001 08/20/19 0615  AST 19 19  ALT 19 18  ALKPHOS 97 63  BILITOT 1.1 0.9  PROT 8.5* 6.3*  ALBUMIN 4.2 3.3*   No results for input(s): LIPASE, AMYLASE in the last 168 hours. No results for input(s): AMMONIA in the last 168 hours. Coagulation profile No results for input(s): INR, PROTIME in the last 168 hours.  CBC: Recent Labs  Lab 08/19/19 1001 08/20/19 0615 08/21/19 0556  WBC 1.3* 0.9* 0.9*  NEUTROABS 0.6*  --  0.4*  HGB 13.0 10.1* 10.8*  HCT 38.0 30.1* 31.3*  MCV 96.0 99.0 96.0  PLT 155 128* 142*   Cardiac Enzymes: No results for input(s): CKTOTAL, CKMB, CKMBINDEX, TROPONINI in the last 168 hours. BNP: Invalid input(s): POCBNP CBG: No results for  input(s): GLUCAP in the last 168 hours. D-Dimer No results for input(s): DDIMER in the last 72 hours. Hgb A1c No results for input(s): HGBA1C in the last 72 hours. Lipid Profile No results for input(s): CHOL, HDL, LDLCALC, TRIG, CHOLHDL, LDLDIRECT in the last 72 hours. Thyroid function studies No results for input(s): TSH, T4TOTAL, T3FREE, THYROIDAB in the last 72 hours.  Invalid input(s): FREET3 Anemia work up No results for input(s): VITAMINB12, FOLATE, FERRITIN, TIBC, IRON, RETICCTPCT in the last 72 hours. Microbiology Recent Results (from the past 240 hour(s))  Urine culture     Status: Abnormal   Collection Time: 08/19/19 10:02 AM   Specimen: Urine, Clean Catch  Result Value Ref Range Status   Specimen Description   Final    URINE, CLEAN CATCH Performed at [facility], [place] [address]  [place], [place] [address]    Special Requests   Final    NONE Performed at [facility], [place] [address], [place], [place] [address]    Culture (A)  Final    <10,000 COLONIES/mL INSIGNIFICANT GROWTH Performed at [facility] [address], [place], [place] [address]    Report Status 08/20/2019 FINAL  Final  Blood culture (routine x 2)     Status: None (Preliminary result)   Collection Time: 08/19/19 11:48 AM   Specimen: BLOOD  Result Value Ref Range Status   Specimen Description BLOOD BLOOD LEFT HAND  Final   Special Requests   Final    BOTTLES DRAWN AEROBIC ONLY Blood Culture adequate volume Performed at [facility] [address], [place], [place] [address]    Culture NO GROWTH 2 DAYS  Final   Report Status PENDING  Incomplete  Urine culture     Status: Abnormal   Collection Time: 08/19/19 11:51 AM   Specimen: Urine, Random  Result Value Ref Range Status   Specimen Description   Final    URINE, RANDOM Performed at [facility] [address], [place], [place] [address]    Special  Requests   Final    NONE Performed at [facility], [place] [address], [place], [place] [address]    Culture (A)  Final    <10,000 COLONIES/mL INSIGNIFICANT GROWTH Performed at [facility] [address] [place], [place] [address]    Report Status 08/20/2019 FINAL  Final  Blood culture (routine x 2)     Status: None (Preliminary result)   Collection Time: 08/19/19 11:53 AM   Specimen: BLOOD  Result Value Ref Range Status   Specimen Description BLOOD RIGHT ANTECUBITAL  Final   Special Requests   Final    BOTTLES DRAWN AEROBIC ONLY Blood Culture adequate volume Performed at [facility] [address], [place],  [address]    Culture NO GROWTH 2 DAYS  Final   Report Status PENDING  Incomplete  SARS CORONAVIRUS 2 (TAT 6-24 HRS) Nasopharyngeal Nasopharyngeal Swab     Status: None   Collection Time: 08/19/19 12:28 PM   Specimen: Nasopharyngeal Swab  Result Value Ref Range Status   SARS Coronavirus 2 NEGATIVE NEGATIVE Final    Comment: (NOTE) SARS-CoV-2 target nucleic acids are NOT DETECTED. The SARS-CoV-2 RNA is generally detectable in upper and lower respiratory specimens during the acute phase of infection. Negative results do not preclude SARS-CoV-2 infection, do not rule out co-infections with other pathogens, and should not be used as the sole basis for treatment or other patient management decisions. Negative results must be combined with clinical observations, patient history, and epidemiological information. The expected result is Negative. Fact Sheet for Patients: SugarRoll.be Fact Sheet for Healthcare Providers: https://www.woods-mathews.com/ This test is not yet approved or cleared by the [place] FDA and  has been authorized for detection and/or diagnosis of SARS-CoV-2 by FDA under an Emergency Use Authorization (EUA). This EUA will remain  in effect (meaning this test can  be used) for the duration of the COVID-19 declaration under Section 56 4(b)(1) of the Act, 21 U.S.C. section 360bbb-3(b)(1), unless the authorization is terminated or revoked sooner. Performed at [facility], [place] [address] [place],  [address]      Discharge Instructions:   Discharge Instructions    Call MD for:   Complete by: As directed    Worsening  symptoms and fever   Call MD for:  temperature >100.4   Complete by: As directed    Diet general   Complete by: As directed    Discharge instructions   Complete by: As directed    Complete the course of antibiotic.  Please follow-up with your primary care physician in 1 week.  Follow-up with your oncologist as scheduled by the oncology clinic.  Increase fluid intake.   Increase activity slowly   Complete by: As directed      Allergies as of 08/21/2019   No Known Allergies     Medication List    TAKE these medications   ciprofloxacin 500 MG tablet Commonly known as: Cipro Take 1 tablet (500 mg total) by mouth 2 (two) times daily for 3 days.   naproxen sodium 220 MG tablet Commonly known as: ALEVE Take 440 mg by mouth 2 (two) times daily as needed (back pain.).   omeprazole 20 MG capsule Commonly known as: PRILOSEC Take 1 capsule (20 mg total) by mouth 2 (two) times daily before a meal. To lower stomach acid   phenazopyridine 100 MG tablet Commonly known as: PYRIDIUM Take 1 tablet (100 mg total) by mouth 3 (three) times daily with meals.   prochlorperazine 10 MG tablet Commonly known as: COMPAZINE Take 1 tablet (10 mg total) by mouth every 6 (six) hours as needed for nausea or vomiting.       Time coordinating discharge: 39 minutes  Signed:  Najia Hurlbutt  [facility] 08/21/2019, 2:54 PM

## 2019-08-21 NOTE — Progress Notes (Signed)
EKG performed as per order and copy placed in shadow chart. MD notified.

## 2019-08-21 NOTE — Progress Notes (Signed)
  Pt is being discharged home today. Discharge instructions including medications and follow up appointments given. Pt had no further questions at this time. 

## 2019-08-22 ENCOUNTER — Other Ambulatory Visit: Payer: Self-pay

## 2019-08-22 ENCOUNTER — Telehealth: Payer: Self-pay

## 2019-08-22 ENCOUNTER — Ambulatory Visit
Admission: RE | Admit: 2019-08-22 | Discharge: 2019-08-22 | Disposition: A | Discharge status: Home / Self Care | Payer: Medicaid Other | Source: Ambulatory Visit | Attending: Radiation Oncology | Admitting: Radiation Oncology

## 2019-08-22 DIAGNOSIS — R300 Dysuria: Secondary | ICD-10-CM | POA: Diagnosis present

## 2019-08-22 DIAGNOSIS — Z510 Encounter for antineoplastic radiation therapy: Secondary | ICD-10-CM | POA: Diagnosis present

## 2019-08-22 DIAGNOSIS — C342 Malignant neoplasm of middle lobe, bronchus or lung: Secondary | ICD-10-CM | POA: Diagnosis present

## 2019-08-22 DIAGNOSIS — C3491 Malignant neoplasm of unspecified part of right bronchus or lung: Secondary | ICD-10-CM | POA: Diagnosis not present

## 2019-08-22 NOTE — Telephone Encounter (Signed)
Transition Care Management Follow-up Telephone Call  Date of discharge and from where: 08/21/2019, [facility]   How have you been since you were released from the hospital? She said she is " doing all right."   Any questions or concerns?  none at this time  Items Reviewed:  Did the pt receive and understand the discharge instructions provided? Yes she has them and she does not have any questions  Medications obtained and verified?  she said she has all medications including the new ones and is taking them as ordered. No questions about meds at this time  Any new allergies since your discharge?  none reported   Do you have support at home? yes, her daughter is with her.   Other (ie: DME, Home Health, etc) no home health or DME ordered.   Functional Questionnaire: (I = Independent and D = Dependent) ADL's: independent   Follow up appointments reviewed:    PCP Hospital f/u appt confirmed? .scheduled appointment with [facility] walk in provider 09/04/2019 @ 1050.  She prefers a [facility] f/u appt confirmed? multple appointments scheduled with oncology including chemo/radiation   Are transportation arrangements needed?  she said her daughter takes her to appts  If their condition worsens, is the pt aware to call  their PCP or go to the ED? Yes  Was the patient provided with contact information for the PCP's office or ED?  she has the phone number for the clinic  Was the pt encouraged to call back with questions or concerns? { yes

## 2019-08-23 ENCOUNTER — Other Ambulatory Visit: Payer: Self-pay

## 2019-08-23 ENCOUNTER — Ambulatory Visit
Admission: RE | Admit: 2019-08-23 | Discharge: 2019-08-23 | Disposition: A | Discharge status: Home / Self Care | Payer: Medicaid Other | Source: Ambulatory Visit | Attending: Radiation Oncology | Admitting: Radiation Oncology

## 2019-08-23 DIAGNOSIS — C3491 Malignant neoplasm of unspecified part of right bronchus or lung: Secondary | ICD-10-CM | POA: Diagnosis not present

## 2019-08-24 LAB — CULTURE, BLOOD (ROUTINE X 2)
Culture: NO GROWTH
Culture: NO GROWTH
Special Requests: ADEQUATE
Special Requests: ADEQUATE

## 2019-08-26 ENCOUNTER — Other Ambulatory Visit: Payer: Self-pay

## 2019-08-26 ENCOUNTER — Inpatient Hospital Stay: Payer: Medicaid Other

## 2019-08-26 ENCOUNTER — Ambulatory Visit: Payer: Medicaid Other

## 2019-08-26 DIAGNOSIS — C342 Malignant neoplasm of middle lobe, bronchus or lung: Secondary | ICD-10-CM | POA: Diagnosis present

## 2019-08-26 DIAGNOSIS — R911 Solitary pulmonary nodule: Secondary | ICD-10-CM

## 2019-08-26 DIAGNOSIS — Z5111 Encounter for antineoplastic chemotherapy: Secondary | ICD-10-CM | POA: Diagnosis not present

## 2019-08-26 LAB — CBC WITH DIFFERENTIAL (CANCER CENTER ONLY)
Abs Immature Granulocytes: 0.01 10*3/uL (ref 0.00–0.07)
Basophils Absolute: 0 10*3/uL (ref 0.0–0.1)
Basophils Relative: 0 %
Eosinophils Absolute: 0 10*3/uL (ref 0.0–0.5)
Eosinophils Relative: 0 %
HCT: 33.7 % — ABNORMAL LOW (ref 36.0–46.0)
Hemoglobin: 11.4 g/dL — ABNORMAL LOW (ref 12.0–15.0)
Immature Granulocytes: 1 %
Lymphocytes Relative: 36 %
Lymphs Abs: 0.4 10*3/uL — ABNORMAL LOW (ref 0.7–4.0)
MCH: 33.2 pg (ref 26.0–34.0)
MCHC: 33.8 g/dL (ref 30.0–36.0)
MCV: 98.3 fL (ref 80.0–100.0)
Monocytes Absolute: 0.3 10*3/uL (ref 0.1–1.0)
Monocytes Relative: 24 %
Neutro Abs: 0.5 10*3/uL — ABNORMAL LOW (ref 1.7–7.7)
Neutrophils Relative %: 39 %
Platelet Count: 234 10*3/uL (ref 150–400)
RBC: 3.43 MIL/uL — ABNORMAL LOW (ref 3.87–5.11)
RDW: 16.5 % — ABNORMAL HIGH (ref 11.5–15.5)
WBC Count: 1.2 10*3/uL — ABNORMAL LOW (ref 4.0–10.5)
nRBC: 1.6 % — ABNORMAL HIGH (ref 0.0–0.2)

## 2019-08-26 LAB — CMP (CANCER CENTER ONLY)
ALT: 16 U/L (ref 0–44)
AST: 14 U/L — ABNORMAL LOW (ref 15–41)
Albumin: 3.7 g/dL (ref 3.5–5.0)
Alkaline Phosphatase: 92 U/L (ref 38–126)
Anion gap: 9 (ref 5–15)
BUN: 14 mg/dL (ref 8–23)
CO2: 23 mmol/L (ref 22–32)
Calcium: 9.5 mg/dL (ref 8.9–10.3)
Chloride: 105 mmol/L (ref 98–111)
Creatinine: 1.08 mg/dL — ABNORMAL HIGH (ref 0.44–1.00)
GFR, Est AFR Am: >60 mL/min (ref >60)
GFR, Estimated: 54 mL/min — ABNORMAL LOW (ref >60)
Glucose, Bld: 120 mg/dL — ABNORMAL HIGH (ref 70–99)
Potassium: 4.1 mmol/L (ref 3.5–5.1)
Sodium: 137 mmol/L (ref 135–145)
Total Bilirubin: 0.8 mg/dL (ref 0.3–1.2)
Total Protein: 8 g/dL (ref 6.5–8.1)

## 2019-08-26 NOTE — Progress Notes (Signed)
Per Dr. Julien Nordmann no treatment today with ANC of 0.5

## 2019-08-27 ENCOUNTER — Other Ambulatory Visit: Payer: Self-pay | Admitting: Radiation Oncology

## 2019-08-27 ENCOUNTER — Ambulatory Visit
Admission: RE | Admit: 2019-08-27 | Discharge: 2019-08-27 | Disposition: A | Discharge status: Home / Self Care | Payer: Medicaid Other | Source: Ambulatory Visit | Attending: Radiation Oncology | Admitting: Radiation Oncology

## 2019-08-27 ENCOUNTER — Other Ambulatory Visit: Payer: Self-pay

## 2019-08-27 DIAGNOSIS — C3491 Malignant neoplasm of unspecified part of right bronchus or lung: Secondary | ICD-10-CM

## 2019-08-27 LAB — CBC WITH DIFFERENTIAL (CANCER CENTER ONLY)
Abs Immature Granulocytes: 0.01 10*3/uL (ref 0.00–0.07)
Basophils Absolute: 0 10*3/uL (ref 0.0–0.1)
Basophils Relative: 1 %
Eosinophils Absolute: 0 10*3/uL (ref 0.0–0.5)
Eosinophils Relative: 0 %
HCT: 32.3 % — ABNORMAL LOW (ref 36.0–46.0)
Hemoglobin: 10.9 g/dL — ABNORMAL LOW (ref 12.0–15.0)
Immature Granulocytes: 1 %
Lymphocytes Relative: 27 %
Lymphs Abs: 0.5 10*3/uL — ABNORMAL LOW (ref 0.7–4.0)
MCH: 33.6 pg (ref 26.0–34.0)
MCHC: 33.7 g/dL (ref 30.0–36.0)
MCV: 99.7 fL (ref 80.0–100.0)
Monocytes Absolute: 0.4 10*3/uL (ref 0.1–1.0)
Monocytes Relative: 22 %
Neutro Abs: 0.9 10*3/uL — ABNORMAL LOW (ref 1.7–7.7)
Neutrophils Relative %: 49 %
Platelet Count: 240 10*3/uL (ref 150–400)
RBC: 3.24 MIL/uL — ABNORMAL LOW (ref 3.87–5.11)
RDW: 16.8 % — ABNORMAL HIGH (ref 11.5–15.5)
WBC Count: 1.8 10*3/uL — ABNORMAL LOW (ref 4.0–10.5)
nRBC: 0 % (ref 0.0–0.2)

## 2019-08-27 NOTE — Progress Notes (Signed)
Pharmacist Chemotherapy Monitoring - Follow Up Assessment    I verify that I have reviewed each item in the below checklist:  . Regimen for the patient is scheduled for the appropriate day and plan matches scheduled date. Marland Kitchen Appropriate non-routine labs are ordered dependent on drug ordered. . If applicable, additional medications reviewed and ordered per protocol based on lifetime cumulative doses and/or treatment regimen.   Plan for follow-up and/or issues identified: No . I-vent associated with next due treatment: No . MD and/or nursing notified: No  Romualdo Bolk [facility] 08/27/2019 10:48 AM

## 2019-08-28 ENCOUNTER — Other Ambulatory Visit: Payer: Self-pay

## 2019-08-28 ENCOUNTER — Ambulatory Visit
Admission: RE | Admit: 2019-08-28 | Discharge: 2019-08-28 | Disposition: A | Discharge status: Home / Self Care | Payer: Medicaid Other | Source: Ambulatory Visit | Attending: Radiation Oncology | Admitting: Radiation Oncology

## 2019-08-28 DIAGNOSIS — C3491 Malignant neoplasm of unspecified part of right bronchus or lung: Secondary | ICD-10-CM | POA: Diagnosis not present

## 2019-08-29 ENCOUNTER — Ambulatory Visit
Admission: RE | Admit: 2019-08-29 | Discharge: 2019-08-29 | Disposition: A | Discharge status: Home / Self Care | Payer: Medicaid Other | Source: Ambulatory Visit | Attending: Radiation Oncology | Admitting: Radiation Oncology

## 2019-08-29 ENCOUNTER — Other Ambulatory Visit: Payer: Self-pay

## 2019-08-29 DIAGNOSIS — C3491 Malignant neoplasm of unspecified part of right bronchus or lung: Secondary | ICD-10-CM | POA: Diagnosis not present

## 2019-08-30 ENCOUNTER — Other Ambulatory Visit: Payer: Self-pay | Admitting: *Deleted

## 2019-08-30 ENCOUNTER — Ambulatory Visit: Payer: Medicaid Other

## 2019-08-30 DIAGNOSIS — C342 Malignant neoplasm of middle lobe, bronchus or lung: Secondary | ICD-10-CM

## 2019-09-02 ENCOUNTER — Inpatient Hospital Stay: Payer: Medicaid Other

## 2019-09-02 ENCOUNTER — Telehealth: Payer: Self-pay | Admitting: Internal Medicine

## 2019-09-02 ENCOUNTER — Ambulatory Visit
Admission: RE | Admit: 2019-09-02 | Discharge: 2019-09-02 | Disposition: A | Discharge status: Home / Self Care | Payer: Medicaid Other | Source: Ambulatory Visit | Attending: Radiation Oncology | Admitting: Radiation Oncology

## 2019-09-02 ENCOUNTER — Other Ambulatory Visit: Payer: Self-pay

## 2019-09-02 ENCOUNTER — Encounter: Payer: Self-pay | Admitting: Internal Medicine

## 2019-09-02 ENCOUNTER — Inpatient Hospital Stay (HOSPITAL_BASED_OUTPATIENT_CLINIC_OR_DEPARTMENT_OTHER): Payer: Medicaid Other | Admitting: Internal Medicine

## 2019-09-02 VITALS — HR 99

## 2019-09-02 VITALS — BP 118/76 | HR 109 | Temp 98.5°F | Resp 20 | Ht 64.0 in | Wt 193.1 lb

## 2019-09-02 DIAGNOSIS — Z5111 Encounter for antineoplastic chemotherapy: Secondary | ICD-10-CM | POA: Diagnosis not present

## 2019-09-02 DIAGNOSIS — C342 Malignant neoplasm of middle lobe, bronchus or lung: Secondary | ICD-10-CM

## 2019-09-02 DIAGNOSIS — C3491 Malignant neoplasm of unspecified part of right bronchus or lung: Secondary | ICD-10-CM | POA: Diagnosis not present

## 2019-09-02 LAB — CBC WITH DIFFERENTIAL (CANCER CENTER ONLY)
Abs Immature Granulocytes: 0.01 10*3/uL (ref 0.00–0.07)
Basophils Absolute: 0 10*3/uL (ref 0.0–0.1)
Basophils Relative: 0 %
Eosinophils Absolute: 0 10*3/uL (ref 0.0–0.5)
Eosinophils Relative: 0 %
HCT: 31.7 % — ABNORMAL LOW (ref 36.0–46.0)
Hemoglobin: 10.6 g/dL — ABNORMAL LOW (ref 12.0–15.0)
Immature Granulocytes: 1 %
Lymphocytes Relative: 28 %
Lymphs Abs: 0.6 10*3/uL — ABNORMAL LOW (ref 0.7–4.0)
MCH: 33.1 pg (ref 26.0–34.0)
MCHC: 33.4 g/dL (ref 30.0–36.0)
MCV: 99.1 fL (ref 80.0–100.0)
Monocytes Absolute: 0.3 10*3/uL (ref 0.1–1.0)
Monocytes Relative: 14 %
Neutro Abs: 1.2 10*3/uL — ABNORMAL LOW (ref 1.7–7.7)
Neutrophils Relative %: 57 %
Platelet Count: 343 10*3/uL (ref 150–400)
RBC: 3.2 MIL/uL — ABNORMAL LOW (ref 3.87–5.11)
RDW: 17.7 % — ABNORMAL HIGH (ref 11.5–15.5)
WBC Count: 2.2 10*3/uL — ABNORMAL LOW (ref 4.0–10.5)
nRBC: 0 % (ref 0.0–0.2)

## 2019-09-02 LAB — CMP (CANCER CENTER ONLY)
ALT: 10 U/L (ref 0–44)
AST: 12 U/L — ABNORMAL LOW (ref 15–41)
Albumin: 3.4 g/dL — ABNORMAL LOW (ref 3.5–5.0)
Alkaline Phosphatase: 98 U/L (ref 38–126)
Anion gap: 10 (ref 5–15)
BUN: 12 mg/dL (ref 8–23)
CO2: 26 mmol/L (ref 22–32)
Calcium: 9.7 mg/dL (ref 8.9–10.3)
Chloride: 106 mmol/L (ref 98–111)
Creatinine: 0.88 mg/dL (ref 0.44–1.00)
GFR, Est AFR Am: >60 mL/min (ref >60)
GFR, Estimated: >60 mL/min (ref >60)
Glucose, Bld: 104 mg/dL — ABNORMAL HIGH (ref 70–99)
Potassium: 3.8 mmol/L (ref 3.5–5.1)
Sodium: 142 mmol/L (ref 135–145)
Total Bilirubin: 0.4 mg/dL (ref 0.3–1.2)
Total Protein: 7.7 g/dL (ref 6.5–8.1)

## 2019-09-02 MED ORDER — FAMOTIDINE IN NACL 20-0.9 MG/50ML-% IV SOLN
INTRAVENOUS | Status: AC
  Filled 2019-09-02: qty 50

## 2019-09-02 MED ORDER — FAMOTIDINE IN NACL 20-0.9 MG/50ML-% IV SOLN
20.0000 mg | Freq: Once | INTRAVENOUS | Status: AC
  Administered 2019-09-02: 20 mg via INTRAVENOUS

## 2019-09-02 MED ORDER — SODIUM CHLORIDE 0.9 % IV SOLN
200.0000 mg | Freq: Once | INTRAVENOUS | Status: AC
  Administered 2019-09-02: 15:00:00 200 mg via INTRAVENOUS
  Filled 2019-09-02: qty 20

## 2019-09-02 MED ORDER — SODIUM CHLORIDE 0.9 % IV SOLN
Freq: Once | INTRAVENOUS | Status: AC
  Filled 2019-09-02: qty 250

## 2019-09-02 MED ORDER — PALONOSETRON HCL INJECTION 0.25 MG/5ML
INTRAVENOUS | Status: AC
  Filled 2019-09-02: qty 5

## 2019-09-02 MED ORDER — DIPHENHYDRAMINE HCL 50 MG/ML IJ SOLN
INTRAMUSCULAR | Status: AC
  Filled 2019-09-02: qty 1

## 2019-09-02 MED ORDER — SODIUM CHLORIDE 0.9 % IV SOLN
45.0000 mg/m2 | Freq: Once | INTRAVENOUS | Status: AC
  Administered 2019-09-02: 90 mg via INTRAVENOUS
  Filled 2019-09-02: qty 15

## 2019-09-02 MED ORDER — PALONOSETRON HCL INJECTION 0.25 MG/5ML
0.2500 mg | Freq: Once | INTRAVENOUS | Status: AC
  Administered 2019-09-02: 0.25 mg via INTRAVENOUS

## 2019-09-02 MED ORDER — SODIUM CHLORIDE 0.9 % IV SOLN
20.0000 mg | Freq: Once | INTRAVENOUS | Status: AC
  Administered 2019-09-02: 20 mg via INTRAVENOUS
  Filled 2019-09-02: qty 2
  Filled 2019-09-02: qty 20

## 2019-09-02 MED ORDER — DIPHENHYDRAMINE HCL 50 MG/ML IJ SOLN
50.0000 mg | Freq: Once | INTRAMUSCULAR | Status: AC
  Administered 2019-09-02: 50 mg via INTRAVENOUS

## 2019-09-02 NOTE — Progress Notes (Signed)
[facility] [phone]   [phone]  OFFICE PROGRESS NOTE  Antony Blackbird, MD [place] [address]  DIAGNOSIS: Stage IIIb (T3, M2, M0) non-small cell lung cancer, adenocarcinoma presented with right hilar mass with occlusion of the right middle lobe bronchus centrally with right middle lobe collapse diagnosed in January 2021.   PRIOR THERAPY: None  CURRENT THERAPY: Concurrent chemoradiation with weekly carboplatin for AUC of 2 and paclitaxel 45 mg/M2.  First dose expected on July 09, 2019.  Status post 6 cycles.  INTERVAL HISTORY: Janice Adams 64 y.o. female returns to the clinic today for follow-up visit.  The patient is feeling fine today with no concerning complaints except for fatigue.  She denied having any chest pain, shortness of breath, cough or hemoptysis.  She denied having any dysphagia or odynophagia.  She has no nausea, vomiting, diarrhea or constipation.  She has no recent weight loss or night sweats.  She continues to tolerate her treatment with concurrent chemoradiation fairly well.  She has been off treatment with chemotherapy for the last few weeks because of neutropenia.  She is here today for evaluation before starting cycle #7.   MEDICAL HISTORY: Past Medical History:  Diagnosis Date  . Back pain   . GERD (gastroesophageal reflux disease)   . Gout    no current problems per patient 05/31/19  . History of epigastric pain    comes and goes  . Hypertension    no meds    ALLERGIES:  has No Known Allergies.  MEDICATIONS:  Current Outpatient Medications  Medication Sig Dispense Refill  . naproxen sodium (ALEVE) 220 MG tablet Take 440 mg by mouth 2 (two) times daily as needed (back pain.).    Marland Kitchen omeprazole (PRILOSEC) 20 MG capsule Take 1 capsule (20 mg total) by mouth 2 (two) times daily before a meal. To lower stomach acid 60 capsule 5  . phenazopyridine (PYRIDIUM) 100 MG tablet Take 1 tablet  (100 mg total) by mouth 3 (three) times daily with meals. 10 tablet 0  . prochlorperazine (COMPAZINE) 10 MG tablet Take 1 tablet (10 mg total) by mouth every 6 (six) hours as needed for nausea or vomiting. 30 tablet 1   No current facility-administered medications for this visit.    SURGICAL HISTORY:  Past Surgical History:  Procedure Laterality Date  . MULTIPLE TOOTH EXTRACTIONS    . TONSILLECTOMY    . uterine ablation    . VIDEO BRONCHOSCOPY WITH ENDOBRONCHIAL ULTRASOUND N/A 06/04/2019   Procedure: VIDEO BRONCHOSCOPY WITH ENDOBRONCHIAL ULTRASOUND;  Surgeon: Margaretha Seeds, MD;  Location: [facility];  Service: Thoracic;  Laterality: N/A;  . WISDOM TOOTH EXTRACTION      REVIEW OF SYSTEMS:  A comprehensive review of systems was negative except for: Constitutional: positive for fatigue   PHYSICAL EXAMINATION: General appearance: alert, cooperative, fatigued and no distress Head: Normocephalic, without obvious abnormality, atraumatic Neck: no adenopathy, no JVD, supple, symmetrical, trachea midline and thyroid not enlarged, symmetric, no tenderness/mass/nodules Lymph nodes: Cervical, supraclavicular, and axillary nodes normal. Resp: clear to auscultation bilaterally Back: symmetric, no curvature. ROM normal. No CVA tenderness. Cardio: regular rate and rhythm, S1, S2 normal, no murmur, click, rub or gallop GI: soft, non-tender; bowel sounds normal; no masses,  no organomegaly Extremities: extremities normal, atraumatic, no cyanosis or edema  ECOG PERFORMANCE STATUS: 1 - Symptomatic but completely ambulatory  Blood pressure 118/76, pulse (!) 109, temperature 98.5 F (36.9 C), temperature source  Oral, resp. rate 20, height 5\' 4"  (1.626 m), weight 193 lb 1.6 oz (87.6 kg), SpO2 99 %.  LABORATORY DATA: Lab Results  Component Value Date   WBC 2.2 (L) 09/02/2019   HGB 10.6 (L) 09/02/2019   HCT 31.7 (L) 09/02/2019   MCV 99.1 09/02/2019   PLT 343 09/02/2019      Chemistry      Component  Value Date/Time   NA 142 09/02/2019 1006   NA 146 (H) 03/06/2018 1128   K 3.8 09/02/2019 1006   CL 106 09/02/2019 1006   CO2 26 09/02/2019 1006   BUN 12 09/02/2019 1006   BUN 8 03/06/2018 1128   CREATININE 0.88 09/02/2019 1006   CREATININE 0.85 05/11/2016 1157      Component Value Date/Time   CALCIUM 9.7 09/02/2019 1006   ALKPHOS 98 09/02/2019 1006   AST 12 (L) 09/02/2019 1006   ALT 10 09/02/2019 1006   BILITOT 0.4 09/02/2019 1006       RADIOGRAPHIC STUDIES: DG Chest Portable 1 View  Result Date: 08/19/2019 CLINICAL DATA:  Tachycardia. EXAM: PORTABLE CHEST 1 VIEW COMPARISON:  Chest x-ray dated June 04, 2019. FINDINGS: Grossly unchanged right infrahilar mass with associated right middle lobe collapse. Normal heart size. Atherosclerotic calcification of the aortic arch. Normal pulmonary vascularity. No pleural effusion or pneumothorax. No acute osseous abnormality. IMPRESSION: 1. Grossly unchanged right infrahilar mass with associated right middle lobe collapse. Electronically Signed   By: Titus Dubin M.D.   On: 08/19/2019 13:07    ASSESSMENT AND PLAN: This is a very pleasant 64 years old African-American female diagnosed with stage IIIb (T3, N2, M0) non-small cell lung cancer, adenocarcinoma presented with right hilar mass with occlusion of the right middle lobe bronchus as well as right middle lobe collapse in addition to right lower lobe pulmonary nodule diagnosed in January 2021. She is currently undergoing a course of concurrent chemoradiation with weekly carboplatin and paclitaxel status post 6 cycles.   The patient has been tolerating her treatment well except for fatigue. She has been off treatment for the last few weeks because of neutropenia but her total white blood count and absolute neutrophil count is better today. I recommended for her to proceed with cycle #7 today as planned.  She is expected to complete the course of radiotherapy on 09/11/2019. I will see the  patient back for follow-up visit in 2 weeks for evaluation and I will order repeat imaging studies at that time. She was advised to call immediately if she has any concerning symptoms in the interval. The patient voices understanding of current disease status and treatment options and is in agreement with the current care plan.  All questions were answered. The patient knows to call the clinic with any problems, questions or concerns. We can certainly see the patient much sooner if necessary.  Disclaimer: This note was dictated with voice recognition software. Similar sounding words can inadvertently be transcribed and may not be corrected upon review.

## 2019-09-02 NOTE — Progress Notes (Signed)
Patient ID: Janice Adams, female   DOB: 01-Mar-1956, 64 y.o.   MRN: [id number]   Virtual Visit via Telephone Note  I connected with Janice Adams on 09/04/19 at  2:50 PM EDT by telephone and verified that I am speaking with the correct person using two identifiers.   I discussed the limitations, risks, security and privacy concerns of performing an evaluation and management service by telephone and the availability of in person appointments. I also discussed with the patient that there may be a patient responsible charge related to this service. The patient expressed understanding and agreed to proceed.   PATIENT visit by telephone virtually in the context of Covid-19 pandemic. Patient location:  home My Location:  [facility] office Persons on the call:  Me and the patient  History of Present Illness: After hospitalization 4/12-4/14/2021 for sepsis.  No fever.  Appetite is good.  No N/V.  Does not need RF.  Cancer center has bloodwork ordered and she is having close f/up with them.  No urinary s/sx.    From discharge summary: Janice Adams a 64 y.o.femalewith past medical history of stage IIIb with rightlung cancer withocclusion of the right middle bronchus on concurrent chemoradiationwith carboplatin and paclitaxel,was seen at the oncology clinic for regular follow-up. Patient complained of fatigue andweakness and hadbeen having some burning urination. Patient was recently evaluated by her primary care was treated for UTI but did not have complete resolution of her symptoms and persisted to have burning sensation.In the clinic, the patientwas also noted to be tachycardic with a heart rate up to142'sand was sent to the emergency department to assess for possible sepsis.  ED Course:In the ED, patient was noted to be mildly tachycardic. She was afebrile. CBC done from the clinic today showed a WBC 1.3, creatinine of1.07.Creatinine at baseline around 0.8. Urinalysis was  significantforwhite cells and leukocytes. Lactate was elevated at 2.5. Blood cultures and urine cultures were sent from the ED. COVID-19 test was sent from the ED as well.In the ED patient received IV fluid bolus, IV Rocephin and was considered for admission to the hospital.   Hospital Course:   Following conditions were addressed during hospitalization as listed below,  sepsis likely secondary to UTI.Patient was tachycardic,tachypneic with leukopeniaand elevated lactate at 2.5on presentation. Lactate worsened initially but improved with IV hydration.   Continued IV fluid hydration and antibiotic lactate normalized to 1.2.  Patient is currently on chemotherapy as outpatient. No fever. She was treated with IV Rocephin for possible UTI.  Will be continued on oral ciprofloxacin on discharge.  At this time, patient has significantly felt better and wishes to go home.  Blood culture and urine culture been negative so far. COVID-19 negative. Neutropenia of 0.9 today.  Spoke with Dr. Julien Nordmann oncology and recommended Granix prior to discharge.  Patient has received Granix prior to discharge.  Stage IIIb lung cancer currently on concurrent chemoradiation. Immunosuppressed status. Patient did not have any pulmonary symptoms.  Remained compensated.  Disposition.  At this time, patient is stable for disposition home.  Patient was given 3 more days of ciprofloxacin on discharge to complete the course     Observations/Objective:  NAD.  A&Ox3   Assessment and Plan: 1. Sepsis, due to unspecified organism, unspecified whether acute organ dysfunction present ([place]) Finished meds-feeling great  2. Mass of right lung Keep f/up heme/onc  3. Hospital discharge follow-up Doing well.  Blood work ordered through [facility] center   Follow Up Instructions: See PCP prn or in 2-3  months   I discussed the assessment and treatment plan with the patient. The patient was provided an opportunity to  ask questions and all were answered. The patient agreed with the plan and demonstrated an understanding of the instructions.   The patient was advised to call back or seek an in-person evaluation if the symptoms worsen or if the condition fails to improve as anticipated.  I provided 9 minutes of non-face-to-face time during this encounter.   Freeman Caldron, PA-C

## 2019-09-02 NOTE — Telephone Encounter (Signed)
Scheduled per los. Called and left msg. Mailed printout  °

## 2019-09-02 NOTE — Patient Instructions (Signed)
   [facility] Discharge Instructions for Patients Receiving Chemotherapy  Today you received the following chemotherapy agents Taxol and Carboplatin   To help prevent nausea and vomiting after your treatment, we encourage you to take your nausea medication as directed.    If you develop nausea and vomiting that is not controlled by your nausea medication, call the clinic.   BELOW ARE SYMPTOMS THAT SHOULD BE REPORTED IMMEDIATELY:  *FEVER GREATER THAN 100.5 F  *CHILLS WITH OR WITHOUT FEVER  NAUSEA AND VOMITING THAT IS NOT CONTROLLED WITH YOUR NAUSEA MEDICATION  *UNUSUAL SHORTNESS OF BREATH  *UNUSUAL BRUISING OR BLEEDING  TENDERNESS IN MOUTH AND THROAT WITH OR WITHOUT PRESENCE OF ULCERS  *URINARY PROBLEMS  *BOWEL PROBLEMS  UNUSUAL RASH Items with * indicate a potential emergency and should be followed up as soon as possible.  Feel free to call the clinic should you have any questions or concerns. The clinic phone number is [phone].  Please show the CHEMO ALERT CARD at check-in to the Emergency Department and triage nurse.   

## 2019-09-03 ENCOUNTER — Ambulatory Visit
Admission: RE | Admit: 2019-09-03 | Discharge: 2019-09-03 | Disposition: A | Discharge status: Home / Self Care | Payer: Medicaid Other | Source: Ambulatory Visit | Attending: Radiation Oncology | Admitting: Radiation Oncology

## 2019-09-03 ENCOUNTER — Other Ambulatory Visit: Payer: Self-pay

## 2019-09-03 DIAGNOSIS — C3491 Malignant neoplasm of unspecified part of right bronchus or lung: Secondary | ICD-10-CM | POA: Diagnosis not present

## 2019-09-03 NOTE — Progress Notes (Signed)
Pharmacist Chemotherapy Monitoring - Follow Up Assessment    I verify that I have reviewed each item in the below checklist:  . Regimen for the patient is scheduled for the appropriate day and plan matches scheduled date. Marland Kitchen Appropriate non-routine labs are ordered dependent on drug ordered. . If applicable, additional medications reviewed and ordered per protocol based on lifetime cumulative doses and/or treatment regimen.   Plan for follow-up and/or issues identified: No . I-vent associated with next due treatment: Yes . MD and/or nursing notified: No   Kennith Center, Pharm.D., CPP 09/03/2019@2 :19 PM

## 2019-09-04 ENCOUNTER — Other Ambulatory Visit: Payer: Self-pay

## 2019-09-04 ENCOUNTER — Ambulatory Visit
Admission: RE | Admit: 2019-09-04 | Discharge: 2019-09-04 | Disposition: A | Discharge status: Home / Self Care | Payer: Medicaid Other | Source: Ambulatory Visit | Attending: Radiation Oncology | Admitting: Radiation Oncology

## 2019-09-04 ENCOUNTER — Ambulatory Visit: Payer: Medicaid Other | Attending: Family Medicine | Admitting: Physician Assistant

## 2019-09-04 DIAGNOSIS — Z09 Encounter for follow-up examination after completed treatment for conditions other than malignant neoplasm: Secondary | ICD-10-CM | POA: Diagnosis not present

## 2019-09-04 DIAGNOSIS — C3491 Malignant neoplasm of unspecified part of right bronchus or lung: Secondary | ICD-10-CM | POA: Diagnosis not present

## 2019-09-04 DIAGNOSIS — A419 Sepsis, unspecified organism: Secondary | ICD-10-CM | POA: Diagnosis not present

## 2019-09-04 DIAGNOSIS — R918 Other nonspecific abnormal finding of lung field: Secondary | ICD-10-CM | POA: Diagnosis not present

## 2019-09-05 ENCOUNTER — Ambulatory Visit
Admission: RE | Admit: 2019-09-05 | Discharge: 2019-09-05 | Disposition: A | Discharge status: Home / Self Care | Payer: Medicaid Other | Source: Ambulatory Visit | Attending: Radiation Oncology | Admitting: Radiation Oncology

## 2019-09-05 ENCOUNTER — Other Ambulatory Visit: Payer: Self-pay

## 2019-09-05 DIAGNOSIS — C3491 Malignant neoplasm of unspecified part of right bronchus or lung: Secondary | ICD-10-CM | POA: Diagnosis not present

## 2019-09-06 ENCOUNTER — Ambulatory Visit
Admission: RE | Admit: 2019-09-06 | Discharge: 2019-09-06 | Disposition: A | Discharge status: Home / Self Care | Payer: Medicaid Other | Source: Ambulatory Visit | Attending: Radiation Oncology | Admitting: Radiation Oncology

## 2019-09-06 ENCOUNTER — Other Ambulatory Visit: Payer: Self-pay | Admitting: Physician Assistant

## 2019-09-06 ENCOUNTER — Other Ambulatory Visit: Payer: Self-pay

## 2019-09-06 DIAGNOSIS — C3491 Malignant neoplasm of unspecified part of right bronchus or lung: Secondary | ICD-10-CM | POA: Diagnosis not present

## 2019-09-06 DIAGNOSIS — C342 Malignant neoplasm of middle lobe, bronchus or lung: Secondary | ICD-10-CM

## 2019-09-09 ENCOUNTER — Inpatient Hospital Stay: Payer: Medicaid Other | Attending: Internal Medicine

## 2019-09-09 ENCOUNTER — Inpatient Hospital Stay: Payer: Medicaid Other

## 2019-09-09 ENCOUNTER — Ambulatory Visit
Admission: RE | Admit: 2019-09-09 | Discharge: 2019-09-09 | Disposition: A | Discharge status: Home / Self Care | Payer: Medicaid Other | Source: Ambulatory Visit | Attending: Radiation Oncology | Admitting: Radiation Oncology

## 2019-09-09 ENCOUNTER — Other Ambulatory Visit: Payer: Self-pay

## 2019-09-09 VITALS — BP 108/80 | HR 99 | Temp 98.3°F | Resp 18 | Wt 193.2 lb

## 2019-09-09 DIAGNOSIS — R000 Tachycardia, unspecified: Secondary | ICD-10-CM | POA: Diagnosis not present

## 2019-09-09 DIAGNOSIS — I10 Essential (primary) hypertension: Secondary | ICD-10-CM | POA: Insufficient documentation

## 2019-09-09 DIAGNOSIS — Z510 Encounter for antineoplastic radiation therapy: Secondary | ICD-10-CM | POA: Diagnosis present

## 2019-09-09 DIAGNOSIS — C342 Malignant neoplasm of middle lobe, bronchus or lung: Secondary | ICD-10-CM | POA: Diagnosis present

## 2019-09-09 DIAGNOSIS — Z79899 Other long term (current) drug therapy: Secondary | ICD-10-CM | POA: Diagnosis not present

## 2019-09-09 DIAGNOSIS — R300 Dysuria: Secondary | ICD-10-CM | POA: Insufficient documentation

## 2019-09-09 DIAGNOSIS — Z5111 Encounter for antineoplastic chemotherapy: Secondary | ICD-10-CM | POA: Diagnosis present

## 2019-09-09 DIAGNOSIS — C3491 Malignant neoplasm of unspecified part of right bronchus or lung: Secondary | ICD-10-CM | POA: Diagnosis present

## 2019-09-09 LAB — CBC WITH DIFFERENTIAL (CANCER CENTER ONLY)
Abs Immature Granulocytes: 0.03 10*3/uL (ref 0.00–0.07)
Basophils Absolute: 0 10*3/uL (ref 0.0–0.1)
Basophils Relative: 1 %
Eosinophils Absolute: 0 10*3/uL (ref 0.0–0.5)
Eosinophils Relative: 1 %
HCT: 32.7 % — ABNORMAL LOW (ref 36.0–46.0)
Hemoglobin: 11 g/dL — ABNORMAL LOW (ref 12.0–15.0)
Immature Granulocytes: 2 %
Lymphocytes Relative: 25 %
Lymphs Abs: 0.5 10*3/uL — ABNORMAL LOW (ref 0.7–4.0)
MCH: 34.3 pg — ABNORMAL HIGH (ref 26.0–34.0)
MCHC: 33.6 g/dL (ref 30.0–36.0)
MCV: 101.9 fL — ABNORMAL HIGH (ref 80.0–100.0)
Monocytes Absolute: 0.3 10*3/uL (ref 0.1–1.0)
Monocytes Relative: 16 %
Neutro Abs: 1.2 10*3/uL — ABNORMAL LOW (ref 1.7–7.7)
Neutrophils Relative %: 55 %
Platelet Count: 455 10*3/uL — ABNORMAL HIGH (ref 150–400)
RBC: 3.21 MIL/uL — ABNORMAL LOW (ref 3.87–5.11)
RDW: 18.6 % — ABNORMAL HIGH (ref 11.5–15.5)
WBC Count: 2 10*3/uL — ABNORMAL LOW (ref 4.0–10.5)
nRBC: 2 % — ABNORMAL HIGH (ref 0.0–0.2)

## 2019-09-09 LAB — CMP (CANCER CENTER ONLY)
ALT: 10 U/L (ref 0–44)
AST: 14 U/L — ABNORMAL LOW (ref 15–41)
Albumin: 3.7 g/dL (ref 3.5–5.0)
Alkaline Phosphatase: 90 U/L (ref 38–126)
Anion gap: 12 (ref 5–15)
BUN: 17 mg/dL (ref 8–23)
CO2: 26 mmol/L (ref 22–32)
Calcium: 9.8 mg/dL (ref 8.9–10.3)
Chloride: 103 mmol/L (ref 98–111)
Creatinine: 0.84 mg/dL (ref 0.44–1.00)
GFR, Est AFR Am: >60 mL/min (ref >60)
GFR, Estimated: >60 mL/min (ref >60)
Glucose, Bld: 86 mg/dL (ref 70–99)
Potassium: 4.6 mmol/L (ref 3.5–5.1)
Sodium: 141 mmol/L (ref 135–145)
Total Bilirubin: 0.4 mg/dL (ref 0.3–1.2)
Total Protein: 8.1 g/dL (ref 6.5–8.1)

## 2019-09-09 MED ORDER — PALONOSETRON HCL INJECTION 0.25 MG/5ML
INTRAVENOUS | Status: AC
  Filled 2019-09-09: qty 5

## 2019-09-09 MED ORDER — PALONOSETRON HCL INJECTION 0.25 MG/5ML
0.2500 mg | Freq: Once | INTRAVENOUS | Status: AC
  Administered 2019-09-09: 0.25 mg via INTRAVENOUS

## 2019-09-09 MED ORDER — DIPHENHYDRAMINE HCL 50 MG/ML IJ SOLN
INTRAMUSCULAR | Status: AC
  Filled 2019-09-09: qty 1

## 2019-09-09 MED ORDER — FAMOTIDINE IN NACL 20-0.9 MG/50ML-% IV SOLN
20.0000 mg | Freq: Once | INTRAVENOUS | Status: AC
  Administered 2019-09-09: 20 mg via INTRAVENOUS

## 2019-09-09 MED ORDER — FAMOTIDINE IN NACL 20-0.9 MG/50ML-% IV SOLN
INTRAVENOUS | Status: AC
  Filled 2019-09-09: qty 50

## 2019-09-09 MED ORDER — SODIUM CHLORIDE 0.9 % IV SOLN
Freq: Once | INTRAVENOUS | Status: AC
  Filled 2019-09-09: qty 250

## 2019-09-09 MED ORDER — DIPHENHYDRAMINE HCL 50 MG/ML IJ SOLN
50.0000 mg | Freq: Once | INTRAMUSCULAR | Status: AC
  Administered 2019-09-09: 50 mg via INTRAVENOUS

## 2019-09-09 MED ORDER — SODIUM CHLORIDE 0.9 % IV SOLN
200.0000 mg | Freq: Once | INTRAVENOUS | Status: AC
  Administered 2019-09-09: 200 mg via INTRAVENOUS
  Filled 2019-09-09: qty 20

## 2019-09-09 MED ORDER — SODIUM CHLORIDE 0.9 % IV SOLN
45.0000 mg/m2 | Freq: Once | INTRAVENOUS | Status: AC
  Administered 2019-09-09: 90 mg via INTRAVENOUS
  Filled 2019-09-09: qty 15

## 2019-09-09 MED ORDER — SODIUM CHLORIDE 0.9 % IV SOLN
20.0000 mg | Freq: Once | INTRAVENOUS | Status: AC
  Administered 2019-09-09: 20 mg via INTRAVENOUS
  Filled 2019-09-09: qty 20
  Filled 2019-09-09: qty 2

## 2019-09-09 NOTE — Progress Notes (Signed)
Per Dr Julien Nordmann, ok to treat with ANC 1.2 today.

## 2019-09-09 NOTE — Patient Instructions (Signed)
[facility] Discharge Instructions for Patients Receiving Chemotherapy  Today you received the following chemotherapy agents: paclitaxel and carboplatin.  To help prevent nausea and vomiting after your treatment, we encourage you to take your nausea medication as directed.   If you develop nausea and vomiting that is not controlled by your nausea medication, call the clinic.   BELOW ARE SYMPTOMS THAT SHOULD BE REPORTED IMMEDIATELY:  *FEVER GREATER THAN 100.5 F  *CHILLS WITH OR WITHOUT FEVER  NAUSEA AND VOMITING THAT IS NOT CONTROLLED WITH YOUR NAUSEA MEDICATION  *UNUSUAL SHORTNESS OF BREATH  *UNUSUAL BRUISING OR BLEEDING  TENDERNESS IN MOUTH AND THROAT WITH OR WITHOUT PRESENCE OF ULCERS  *URINARY PROBLEMS  *BOWEL PROBLEMS  UNUSUAL RASH Items with * indicate a potential emergency and should be followed up as soon as possible.  Feel free to call the clinic should you have any questions or concerns. The clinic phone number is [phone].  Please show the CHEMO ALERT CARD at check-in to the Emergency Department and triage nurse.   

## 2019-09-10 ENCOUNTER — Other Ambulatory Visit: Payer: Self-pay

## 2019-09-10 ENCOUNTER — Ambulatory Visit
Admission: RE | Admit: 2019-09-10 | Discharge: 2019-09-10 | Disposition: A | Discharge status: Home / Self Care | Payer: Medicaid Other | Source: Ambulatory Visit | Attending: Radiation Oncology | Admitting: Radiation Oncology

## 2019-09-10 ENCOUNTER — Ambulatory Visit: Payer: Medicaid Other

## 2019-09-10 DIAGNOSIS — C3491 Malignant neoplasm of unspecified part of right bronchus or lung: Secondary | ICD-10-CM | POA: Diagnosis not present

## 2019-09-11 ENCOUNTER — Other Ambulatory Visit: Payer: Self-pay

## 2019-09-11 ENCOUNTER — Encounter: Payer: Self-pay | Admitting: Radiation Oncology

## 2019-09-11 ENCOUNTER — Ambulatory Visit
Admission: RE | Admit: 2019-09-11 | Discharge: 2019-09-11 | Disposition: A | Discharge status: Home / Self Care | Payer: Medicaid Other | Source: Ambulatory Visit | Attending: Radiation Oncology | Admitting: Radiation Oncology

## 2019-09-11 DIAGNOSIS — C3491 Malignant neoplasm of unspecified part of right bronchus or lung: Secondary | ICD-10-CM | POA: Diagnosis not present

## 2019-09-16 ENCOUNTER — Inpatient Hospital Stay (HOSPITAL_BASED_OUTPATIENT_CLINIC_OR_DEPARTMENT_OTHER): Payer: Medicaid Other | Admitting: Internal Medicine

## 2019-09-16 ENCOUNTER — Encounter: Payer: Self-pay | Admitting: Internal Medicine

## 2019-09-16 ENCOUNTER — Emergency Department (HOSPITAL_COMMUNITY): Payer: Medicaid Other

## 2019-09-16 ENCOUNTER — Other Ambulatory Visit: Payer: Self-pay

## 2019-09-16 ENCOUNTER — Encounter (HOSPITAL_COMMUNITY): Payer: Self-pay

## 2019-09-16 ENCOUNTER — Emergency Department (HOSPITAL_COMMUNITY)
Admission: EM | Admit: 2019-09-16 | Discharge: 2019-09-16 | Disposition: A | Discharge status: Home / Self Care | Payer: Medicaid Other | Attending: Emergency Medicine | Admitting: Emergency Medicine

## 2019-09-16 VITALS — BP 110/80 | HR 126 | Temp 98.0°F | Resp 20 | Ht 64.0 in | Wt 190.0 lb

## 2019-09-16 DIAGNOSIS — C3490 Malignant neoplasm of unspecified part of unspecified bronchus or lung: Secondary | ICD-10-CM

## 2019-09-16 DIAGNOSIS — F1210 Cannabis abuse, uncomplicated: Secondary | ICD-10-CM | POA: Diagnosis not present

## 2019-09-16 DIAGNOSIS — I471 Supraventricular tachycardia: Secondary | ICD-10-CM

## 2019-09-16 DIAGNOSIS — C342 Malignant neoplasm of middle lobe, bronchus or lung: Secondary | ICD-10-CM

## 2019-09-16 DIAGNOSIS — Z79899 Other long term (current) drug therapy: Secondary | ICD-10-CM | POA: Insufficient documentation

## 2019-09-16 DIAGNOSIS — R000 Tachycardia, unspecified: Secondary | ICD-10-CM | POA: Insufficient documentation

## 2019-09-16 DIAGNOSIS — R5383 Other fatigue: Secondary | ICD-10-CM | POA: Insufficient documentation

## 2019-09-16 DIAGNOSIS — I10 Essential (primary) hypertension: Secondary | ICD-10-CM | POA: Insufficient documentation

## 2019-09-16 DIAGNOSIS — Z87891 Personal history of nicotine dependence: Secondary | ICD-10-CM | POA: Insufficient documentation

## 2019-09-16 DIAGNOSIS — Z5111 Encounter for antineoplastic chemotherapy: Secondary | ICD-10-CM | POA: Diagnosis not present

## 2019-09-16 DIAGNOSIS — E860 Dehydration: Secondary | ICD-10-CM | POA: Insufficient documentation

## 2019-09-16 LAB — CBC WITH DIFFERENTIAL/PLATELET
Abs Immature Granulocytes: 0.02 10*3/uL (ref 0.00–0.07)
Basophils Absolute: 0 10*3/uL (ref 0.0–0.1)
Basophils Relative: 0 %
Eosinophils Absolute: 0 10*3/uL (ref 0.0–0.5)
Eosinophils Relative: 0 %
HCT: 34.1 % — ABNORMAL LOW (ref 36.0–46.0)
Hemoglobin: 11.6 g/dL — ABNORMAL LOW (ref 12.0–15.0)
Immature Granulocytes: 1 %
Lymphocytes Relative: 17 %
Lymphs Abs: 0.5 10*3/uL — ABNORMAL LOW (ref 0.7–4.0)
MCH: 35.3 pg — ABNORMAL HIGH (ref 26.0–34.0)
MCHC: 34 g/dL (ref 30.0–36.0)
MCV: 103.6 fL — ABNORMAL HIGH (ref 80.0–100.0)
Monocytes Absolute: 0.4 10*3/uL (ref 0.1–1.0)
Monocytes Relative: 15 %
Neutro Abs: 1.8 10*3/uL (ref 1.7–7.7)
Neutrophils Relative %: 67 %
Platelets: 410 10*3/uL — ABNORMAL HIGH (ref 150–400)
RBC: 3.29 MIL/uL — ABNORMAL LOW (ref 3.87–5.11)
RDW: 19 % — ABNORMAL HIGH (ref 11.5–15.5)
WBC: 2.8 10*3/uL — ABNORMAL LOW (ref 4.0–10.5)
nRBC: 1.1 % — ABNORMAL HIGH (ref 0.0–0.2)

## 2019-09-16 LAB — TROPONIN I (HIGH SENSITIVITY)
Troponin I (High Sensitivity): 9 ng/L (ref <18)
Troponin I (High Sensitivity): 8 ng/L (ref <18)

## 2019-09-16 LAB — COMPREHENSIVE METABOLIC PANEL
ALT: 15 U/L (ref 0–44)
AST: 19 U/L (ref 15–41)
Albumin: 4.1 g/dL (ref 3.5–5.0)
Alkaline Phosphatase: 80 U/L (ref 38–126)
Anion gap: 11 (ref 5–15)
BUN: 22 mg/dL (ref 8–23)
CO2: 23 mmol/L (ref 22–32)
Calcium: 9.5 mg/dL (ref 8.9–10.3)
Chloride: 102 mmol/L (ref 98–111)
Creatinine, Ser: 0.94 mg/dL (ref 0.44–1.00)
GFR calc Af Amer: >60 mL/min (ref >60)
GFR calc non Af Amer: >60 mL/min (ref >60)
Glucose, Bld: 104 mg/dL — ABNORMAL HIGH (ref 70–99)
Potassium: 4.6 mmol/L (ref 3.5–5.1)
Sodium: 136 mmol/L (ref 135–145)
Total Bilirubin: 0.5 mg/dL (ref 0.3–1.2)
Total Protein: 7.7 g/dL (ref 6.5–8.1)

## 2019-09-16 LAB — URINALYSIS, ROUTINE W REFLEX MICROSCOPIC
Bacteria, UA: NONE SEEN
Bilirubin Urine: NEGATIVE
Glucose, UA: NEGATIVE mg/dL
Hgb urine dipstick: NEGATIVE
Ketones, ur: NEGATIVE mg/dL
Nitrite: NEGATIVE
Protein, ur: NEGATIVE mg/dL
Specific Gravity, Urine: 1.017 (ref 1.005–1.030)
pH: 5 (ref 5.0–8.0)

## 2019-09-16 LAB — MAGNESIUM: Magnesium: 1.8 mg/dL (ref 1.7–2.4)

## 2019-09-16 LAB — TSH: TSH: 3.444 u[IU]/mL (ref 0.350–4.500)

## 2019-09-16 IMAGING — CT CT ANGIO CHEST
2 of 6 series · 18 of 46 positions shown · IV contrast (APPLIED)
Comparison: [DATE]

CLINICAL DATA: Lightheaded and tachycardia.

EXAM:
CT ANGIOGRAPHY CHEST WITH CONTRAST
TECHNIQUE: Multidetector CT imaging of the chest was performed using the
standard protocol during bolus administration of intravenous
contrast. Multiplanar CT image reconstructions and MIPs were
obtained to evaluate the vascular anatomy.
CONTRAST:  100mL OMNIPAQUE IOHEXOL 350 MG/ML SOLN

[Series 5: thins · axial · 0.74mm/px · z∈[-480,-221]mm · 16 of 285 slices shown]
[im 13/285  lung]
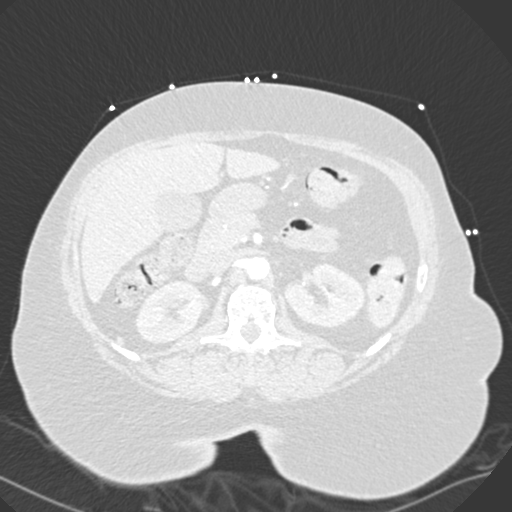
[im 38/285  soft-tissue]
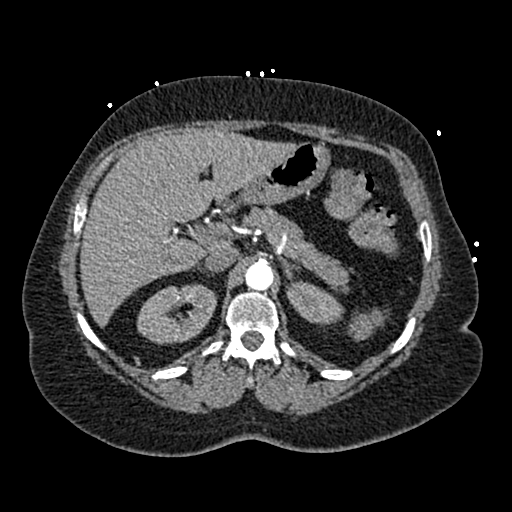
[im 50/285  lung]
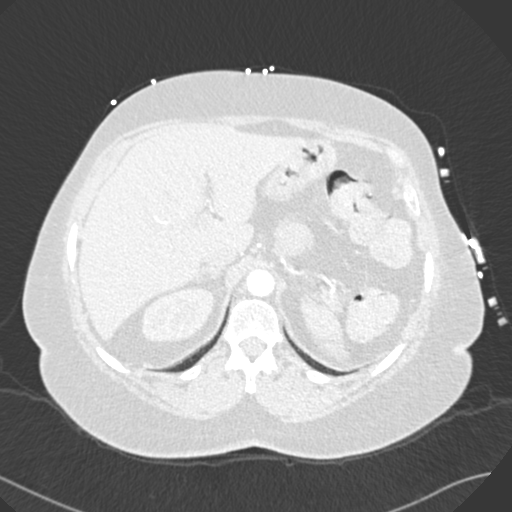
[im 62/285  soft-tissue]
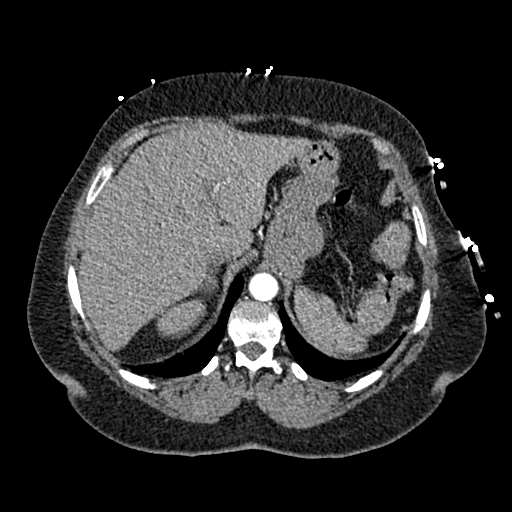
[im 87/285  lung]
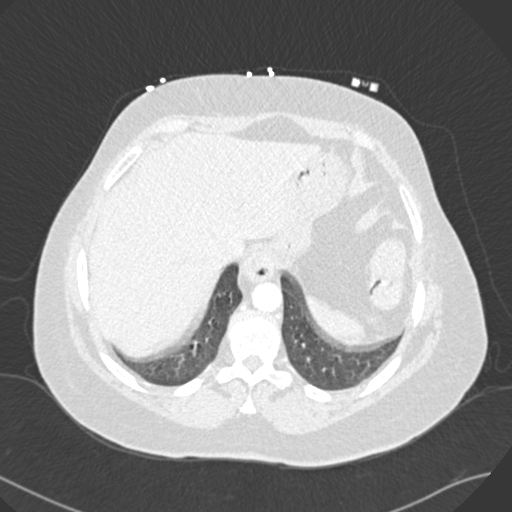
[im 99/285  soft-tissue]
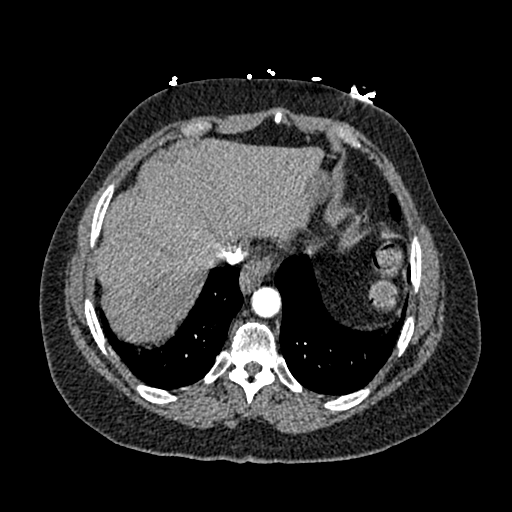
[im 112/285  lung]
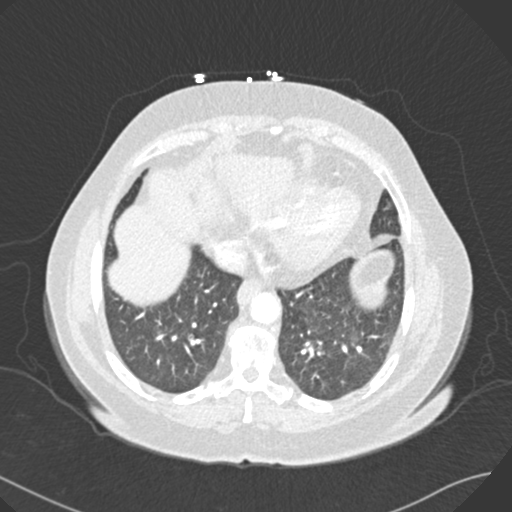
[im 136/285  soft-tissue]
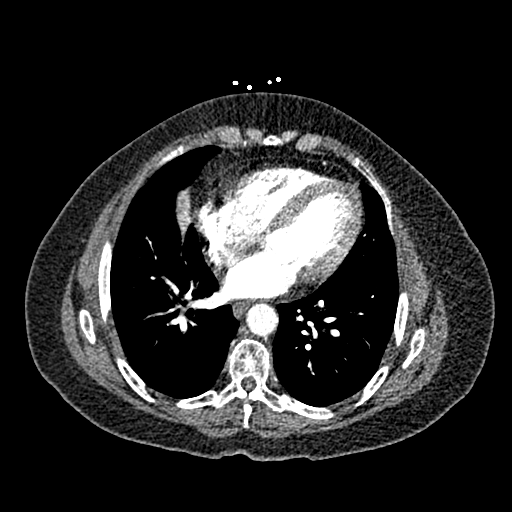
[im 149/285  lung]
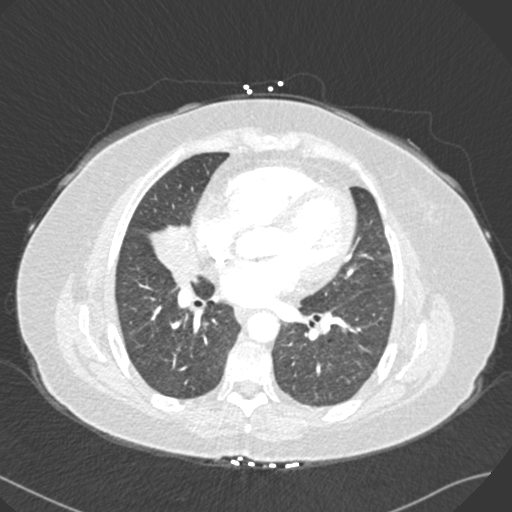
[im 173/285  soft-tissue]
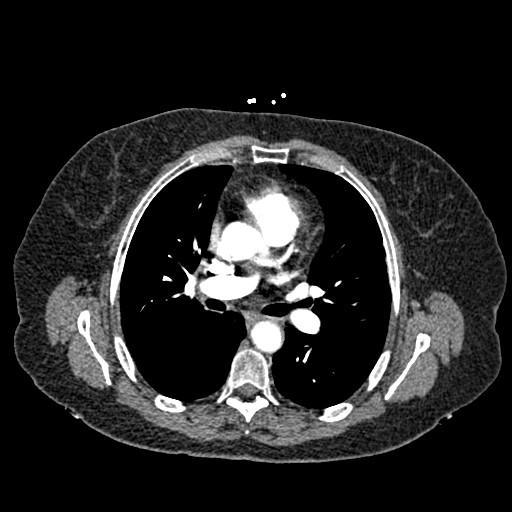
[im 186/285  lung]
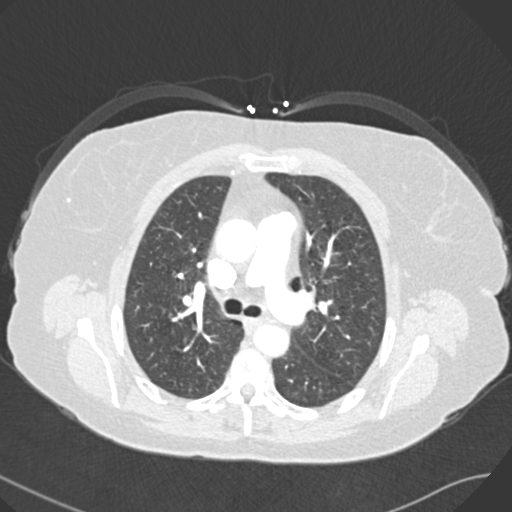
[im 198/285  soft-tissue]
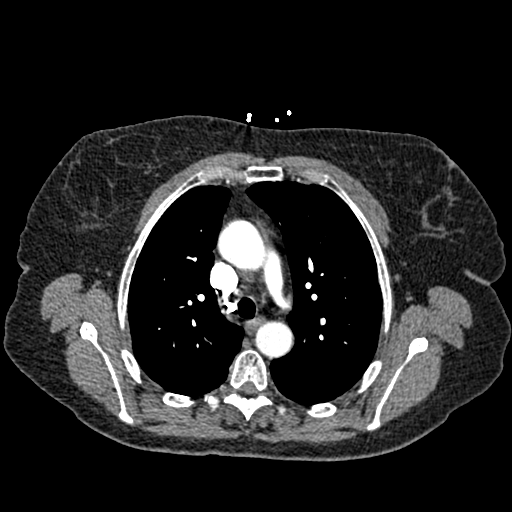
[im 223/285  lung]
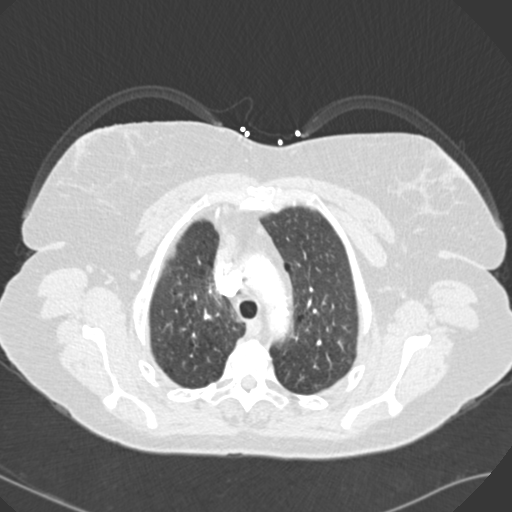
[im 235/285  soft-tissue]
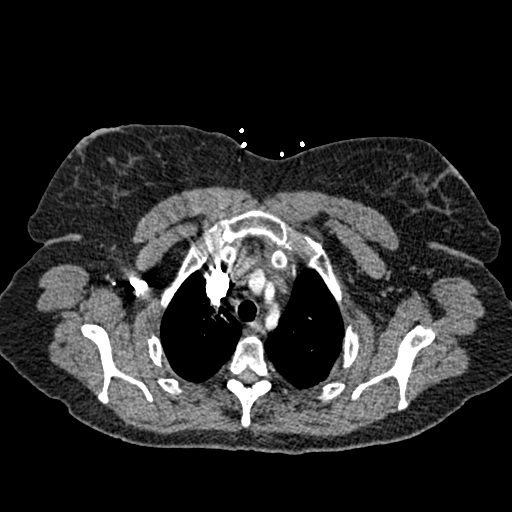
[im 247/285  lung]
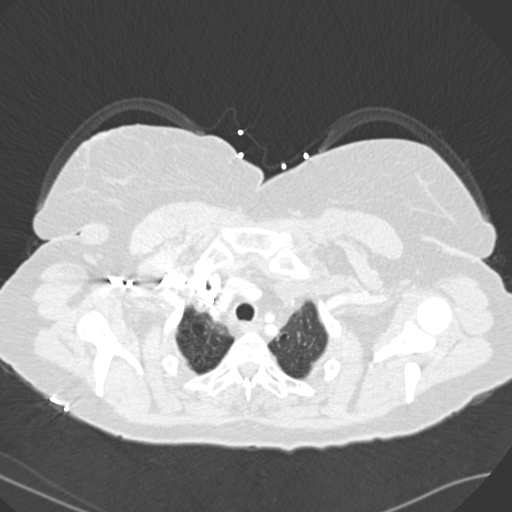
[im 272/285  soft-tissue]
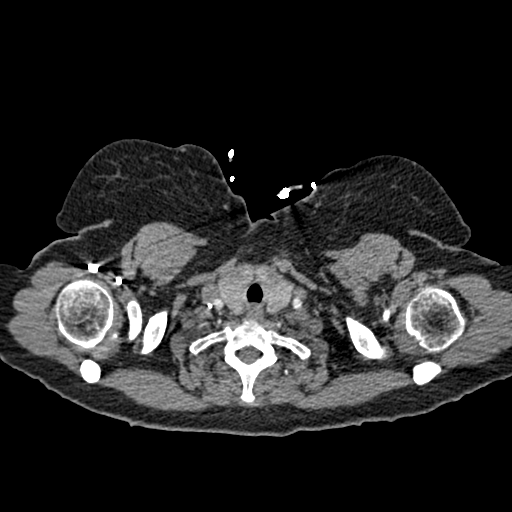

[Series 6: coronal mpr · coronal · 0.56mm/px · 2 of 106 slices shown]
[im 36/106  soft-tissue]
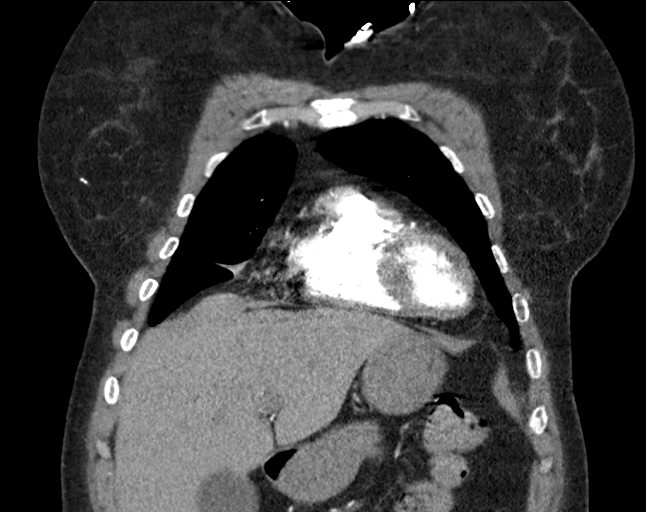
[im 71/106  soft-tissue]
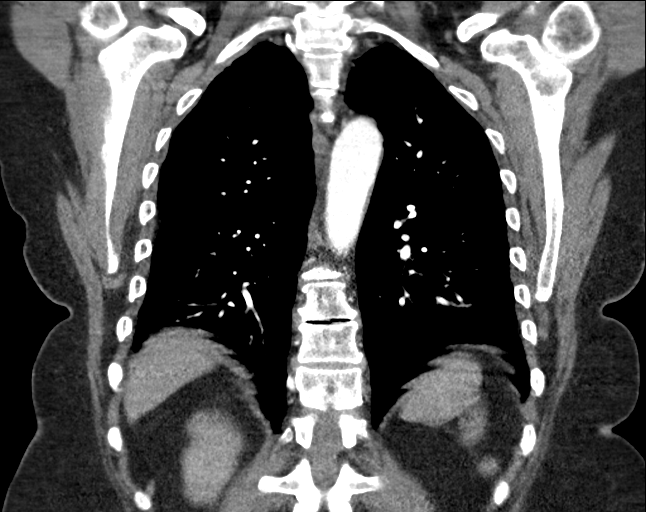

[18 of 46 positions shown; findings below may reference images not displayed]

FINDINGS: Cardiovascular: There is marked severity calcification of the aortic
arch. Satisfactory opacification of the pulmonary arteries to the
segmental level. No evidence of pulmonary embolism. Normal heart
size. No pericardial effusion.

Mediastinum/Nodes: No enlarged mediastinal, hilar, or axillary lymph
nodes. Thyroid gland, trachea, and esophagus demonstrate no
significant findings.

Lungs/Pleura: Mild linear scarring is seen along the anteromedial
aspect of the right apex.

A stable 1.0 cm noncalcified lung nodule is seen within the
posteromedial aspect of the right lower lobe.

A stable 4.3 cm x 2.6 cm lung mass is seen within the inferior
medial aspect of the right upper lobe. This is along the anterior
aspect of the right hilum.

There is no evidence of acute infiltrate, pleural effusion or
pneumothorax.

Upper Abdomen: There is a small hiatal hernia.

A subtle 1.0 cm x 0.7 cm area of parenchymal low attenuation is seen
within the lateral aspect of the right lobe of the liver (axial CT
image 65, CT series number 4).

Musculoskeletal: Stable 2.2 cm x 1.8 cm and 1.5 cm x 0.9 cm cystic
appearing areas are seen within the subcutaneous fat just below the
lateral aspect of the right breast.

Multilevel degenerative changes seen throughout the thoracic spine.

Review of the MIP images confirms the above findings.
IMPRESSION: 1. No evidence for pulmonary embolus.
2. Stable 4.3 cm x 2.6 cm right upper lobe lung mass.
3. Stable 1.0 cm noncalcified lung nodule within the posteromedial
aspect of the right lower lobe.
4. Stable cystic appearing areas within the subcutaneous fat just
below the lateral aspect of the right breast. Correlation with
physical exam is recommended.
5. Small hiatal hernia.

Aortic Atherosclerosis ([WB]-[WB]).

## 2019-09-16 MED ORDER — SODIUM CHLORIDE 0.9 % IV BOLUS
1000.0000 mL | Freq: Once | INTRAVENOUS | Status: AC
  Administered 2019-09-16: 1000 mL via INTRAVENOUS

## 2019-09-16 MED ORDER — IOHEXOL 350 MG/ML SOLN
100.0000 mL | Freq: Once | INTRAVENOUS | Status: AC | PRN
  Administered 2019-09-16: 100 mL via INTRAVENOUS

## 2019-09-16 NOTE — Progress Notes (Signed)
Report called to [facility] ED charge nurse for tachycardia.

## 2019-09-16 NOTE — ED Notes (Signed)
Signature pad not available at time of d/c. Pt verbalized understanding of d/c paperwork

## 2019-09-16 NOTE — Discharge Instructions (Signed)
Your work-up today was overall reassuring.  There was no evidence of blood clot on your CT scan.  Please follow-up with your oncologist for further recommendations and management on the lung masses.  Your heart rate improved with the fluids and you continue to feel well.  We feel you are safe for discharge home.  Please rest and stay hydrated.  If any symptoms return or worsen, was return to nearest emergency department.

## 2019-09-16 NOTE — Progress Notes (Signed)
[place] [phone]   [phone]  OFFICE PROGRESS NOTE  Antony Blackbird, MD [place] [address]  DIAGNOSIS: Stage IIIb (T3, M2, M0) non-small cell lung cancer, adenocarcinoma presented with right hilar mass with occlusion of the right middle lobe bronchus centrally with right middle lobe collapse diagnosed in January 2021.   PRIOR THERAPY: Concurrent chemoradiation with weekly carboplatin for AUC of 2 and paclitaxel 45 mg/M2.  First dose expected on July 09, 2019.  Status post 8 cycles.  Last dose was given Sep 09, 2019.  CURRENT THERAPY: None.  INTERVAL HISTORY: Janice Adams 64 y.o. female returns to the clinic today for follow-up visit accompanied by her son.  The patient is feeling fine today with no concerning complaints except for fatigue.  She tolerated the course of concurrent chemoradiation fairly well.  She denied having any current chest pain, shortness of breath, cough or hemoptysis.  She denied having any fever or chills.  She has no nausea, vomiting, diarrhea or constipation.  She denied having any headache or visual changes.  She is here today for evaluation after completion of her concurrent chemoradiation.  MEDICAL HISTORY: Past Medical History:  Diagnosis Date  . Back pain   . GERD (gastroesophageal reflux disease)   . Gout    no current problems per patient 05/31/19  . History of epigastric pain    comes and goes  . Hypertension    no meds    ALLERGIES:  has No Known Allergies.  MEDICATIONS:  Current Outpatient Medications  Medication Sig Dispense Refill  . naproxen sodium (ALEVE) 220 MG tablet Take 440 mg by mouth 2 (two) times daily as needed (back pain.).    Marland Kitchen omeprazole (PRILOSEC) 20 MG capsule Take 1 capsule (20 mg total) by mouth 2 (two) times daily before a meal. To lower stomach acid 60 capsule 5  . phenazopyridine (PYRIDIUM) 100 MG tablet Take 1 tablet (100 mg total) by mouth 3  (three) times daily with meals. 10 tablet 0  . prochlorperazine (COMPAZINE) 10 MG tablet Take 1 tablet (10 mg total) by mouth every 6 (six) hours as needed for nausea or vomiting. 30 tablet 1   No current facility-administered medications for this visit.    SURGICAL HISTORY:  Past Surgical History:  Procedure Laterality Date  . MULTIPLE TOOTH EXTRACTIONS    . TONSILLECTOMY    . uterine ablation    . VIDEO BRONCHOSCOPY WITH ENDOBRONCHIAL ULTRASOUND N/A 06/04/2019   Procedure: VIDEO BRONCHOSCOPY WITH ENDOBRONCHIAL ULTRASOUND;  Surgeon: Margaretha Seeds, MD;  Location: [place];  Service: Thoracic;  Laterality: N/A;  . WISDOM TOOTH EXTRACTION      REVIEW OF SYSTEMS:  A comprehensive review of systems was negative except for: Constitutional: positive for fatigue   PHYSICAL EXAMINATION: General appearance: alert, cooperative, fatigued and no distress Head: Normocephalic, without obvious abnormality, atraumatic Neck: no adenopathy, no JVD, supple, symmetrical, trachea midline and thyroid not enlarged, symmetric, no tenderness/mass/nodules Lymph nodes: Cervical, supraclavicular, and axillary nodes normal. Resp: clear to auscultation bilaterally Back: symmetric, no curvature. ROM normal. No CVA tenderness. Cardio: Tachycardic GI: soft, non-tender; bowel sounds normal; no masses,  no organomegaly Extremities: extremities normal, atraumatic, no cyanosis or edema  ECOG PERFORMANCE STATUS: 1 - Symptomatic but completely ambulatory  Blood pressure 110/80, pulse (!) 126, temperature 98 F (36.7 C), temperature source Temporal, resp. rate 20, height 5\' 4"  (1.626 m), weight 190 lb (86.2 kg), SpO2 98 %.  LABORATORY DATA: Lab Results  Component Value Date   WBC 2.0 (L) 09/09/2019   HGB 11.0 (L) 09/09/2019   HCT 32.7 (L) 09/09/2019   MCV 101.9 (H) 09/09/2019   PLT 455 (H) 09/09/2019      Chemistry      Component Value Date/Time   NA 141 09/09/2019 0929   NA 146 (H) 03/06/2018 1128   K 4.6  09/09/2019 0929   CL 103 09/09/2019 0929   CO2 26 09/09/2019 0929   BUN 17 09/09/2019 0929   BUN 8 03/06/2018 1128   CREATININE 0.84 09/09/2019 0929   CREATININE 0.85 05/11/2016 1157      Component Value Date/Time   CALCIUM 9.8 09/09/2019 0929   ALKPHOS 90 09/09/2019 0929   AST 14 (L) 09/09/2019 0929   ALT 10 09/09/2019 0929   BILITOT 0.4 09/09/2019 0929       RADIOGRAPHIC STUDIES: DG Chest Portable 1 View  Result Date: 08/19/2019 CLINICAL DATA:  Tachycardia. EXAM: PORTABLE CHEST 1 VIEW COMPARISON:  Chest x-ray dated June 04, 2019. FINDINGS: Grossly unchanged right infrahilar mass with associated right middle lobe collapse. Normal heart size. Atherosclerotic calcification of the aortic arch. Normal pulmonary vascularity. No pleural effusion or pneumothorax. No acute osseous abnormality. IMPRESSION: 1. Grossly unchanged right infrahilar mass with associated right middle lobe collapse. Electronically Signed   By: Titus Dubin M.D.   On: 08/19/2019 13:07    ASSESSMENT AND PLAN: This is a very pleasant 64 years old African-American female diagnosed with stage IIIb (T3, N2, M0) non-small cell lung cancer, adenocarcinoma presented with right hilar mass with occlusion of the right middle lobe bronchus as well as right middle lobe collapse in addition to right lower lobe pulmonary nodule diagnosed in January 2021. She completed a course of concurrent chemoradiation with weekly carboplatin and paclitaxel status post 8 cycles.   She tolerated her treatment well except for fatigue. I recommended for the patient to have repeat CT scan of the chest for restaging of her disease.  I will see her back for follow-up visit in 3 weeks for evaluation and discussion of the scan results and treatment options.  For the tachycardia, will order EKG today to rule out any cardiac abnormalities. The patient was advised to call immediately if she has any concerning symptoms in the interval. The patient voices  understanding of current disease status and treatment options and is in agreement with the current care plan.  All questions were answered. The patient knows to call the clinic with any problems, questions or concerns. We can certainly see the patient much sooner if necessary.  Disclaimer: This note was dictated with voice recognition software. Similar sounding words can inadvertently be transcribed and may not be corrected upon review.

## 2019-09-16 NOTE — ED Notes (Signed)
Son at bedside.

## 2019-09-16 NOTE — ED Provider Notes (Signed)
[facility] DEPT Provider Note   CSN: [id number] Arrival date & time: 09/16/19  1556     History Chief Complaint  Patient presents with  . Tachycardia    Janice Adams is a 64 y.o. female.  The history is provided by the patient and medical records. No language interpreter was used.  Illness Location:  Generalized fatigue, tachycardia, and lightheadedness Severity:  Moderate Onset quality:  Gradual Duration:  3 days Timing:  Constant Progression:  Worsening Chronicity:  New Associated symptoms: fatigue   Associated symptoms: no abdominal pain, no chest pain, no congestion, no cough, no diarrhea, no fever, no headaches, no loss of consciousness, no nausea, no rash, no rhinorrhea, no shortness of breath, no vomiting and no wheezing        Past Medical History:  Diagnosis Date  . Back pain   . GERD (gastroesophageal reflux disease)   . Gout    no current problems per patient 05/31/19  . History of epigastric pain    comes and goes  . Hypertension    no meds    Patient Active Problem List   Diagnosis Date Noted  . UTI (urinary tract infection) 08/19/2019  . SVT (supraventricular tachycardia) ([place]) 08/19/2019  . Sepsis ([place]) 08/19/2019  . Encounter for antineoplastic chemotherapy 07/09/2019  . Goals of care, counseling/discussion 07/09/2019  . Primary malignant neoplasm of bronchus of right middle lobe ([place]) 06/12/2019  . Mass of right lung 05/30/2019  . GERD (gastroesophageal reflux disease) 11/08/2012  . Smoker 11/08/2012    Past Surgical History:  Procedure Laterality Date  . MULTIPLE TOOTH EXTRACTIONS    . TONSILLECTOMY    . uterine ablation    . VIDEO BRONCHOSCOPY WITH ENDOBRONCHIAL ULTRASOUND N/A 06/04/2019   Procedure: VIDEO BRONCHOSCOPY WITH ENDOBRONCHIAL ULTRASOUND;  Surgeon: Margaretha Seeds, MD;  Location: [facility];  Service: Thoracic;  Laterality: N/A;  . WISDOM TOOTH EXTRACTION       OB History   No obstetric  history on file.     Family History  Problem Relation Age of Onset  . Breast cancer Mother   . Cancer Mother   . Seizures Father   . Breast cancer Maternal Grandmother   . Breast cancer Daughter   . Cancer Daughter   . Heart attack Brother     Social History   Tobacco Use  . Smoking status: Former Smoker    Packs/day: 0.50    Years: 47.00    Pack years: 23.50    Types: Cigarettes    Quit date: 07/07/2019    Years since quitting: 0.1  . Smokeless tobacco: Never Used  Substance Use Topics  . Alcohol use: No    Alcohol/week: 0.0 standard drinks  . Drug use: No    Types: Marijuana    Comment: last use Wed 05/28/19    Home Medications Prior to Admission medications   Medication Sig Start Date End Date Taking? Authorizing Provider  naproxen sodium (ALEVE) 220 MG tablet Take 440 mg by mouth 2 (two) times daily as needed (back pain.).    [provider]  omeprazole (PRILOSEC) 20 MG capsule Take 1 capsule (20 mg total) by mouth 2 (two) times daily before a meal. To lower stomach acid 03/15/19   Fulp, Cammie, MD  phenazopyridine (PYRIDIUM) 100 MG tablet Take 1 tablet (100 mg total) by mouth 3 (three) times daily with meals. 08/21/19   Pokhrel, Corrie Mckusick, MD  prochlorperazine (COMPAZINE) 10 MG tablet Take 1 tablet (10 mg total)  by mouth every 6 (six) hours as needed for nausea or vomiting. 07/29/19   Curt Bears, MD    Allergies    Patient has no known allergies.  Review of Systems   Review of Systems  Constitutional: Positive for fatigue. Negative for chills, diaphoresis and fever.  HENT: Negative for congestion and rhinorrhea.   Eyes: Negative for photophobia and visual disturbance.  Respiratory: Negative for cough, chest tightness, shortness of breath and wheezing.   Cardiovascular: Negative for chest pain, palpitations and leg swelling.  Gastrointestinal: Negative for abdominal pain, constipation, diarrhea, nausea and vomiting.  Genitourinary: Negative for  dysuria, flank pain and frequency.  Musculoskeletal: Negative for back pain, neck pain and neck stiffness.  Skin: Negative for rash and wound.  Neurological: Positive for light-headedness. Negative for loss of consciousness, weakness, numbness and headaches.  Psychiatric/Behavioral: Negative for agitation and confusion.  All other systems reviewed and are negative.   Physical Exam Updated Vital Signs BP (!) 122/95 (BP Location: Right Arm)   Pulse (!) 122   Temp 97.8 F (36.6 C) (Oral)   Resp 18   LMP  (LMP Unknown)   SpO2 93%   Physical Exam Vitals and nursing note reviewed.  Constitutional:      General: She is not in acute distress.    Appearance: She is well-developed. She is not ill-appearing, toxic-appearing or diaphoretic.  HENT:     Head: Normocephalic and atraumatic.     Nose: No congestion or rhinorrhea.     Mouth/Throat:     Mouth: Mucous membranes are dry.     Pharynx: No oropharyngeal exudate or posterior oropharyngeal erythema.  Eyes:     Extraocular Movements: Extraocular movements intact.     Conjunctiva/sclera: Conjunctivae normal.     Pupils: Pupils are equal, round, and reactive to light.  Cardiovascular:     Rate and Rhythm: Regular rhythm. Tachycardia present.     Pulses: Normal pulses.     Heart sounds: No murmur.  Pulmonary:     Effort: Pulmonary effort is normal. No respiratory distress.     Breath sounds: Normal breath sounds. No wheezing, rhonchi or rales.  Chest:     Chest wall: No tenderness.  Abdominal:     General: Abdomen is flat.     Palpations: Abdomen is soft.     Tenderness: There is no abdominal tenderness. There is no right CVA tenderness, left CVA tenderness, guarding or rebound.  Musculoskeletal:        General: No tenderness.     Cervical back: Neck supple. No tenderness.     Right lower leg: No edema.     Left lower leg: No edema.  Skin:    General: Skin is warm and dry.     Capillary Refill: Capillary refill takes less than  2 seconds.     Findings: No erythema.  Neurological:     General: No focal deficit present.     Mental Status: She is alert.  Psychiatric:        Mood and Affect: Mood normal.     ED Results / Procedures / Treatments   Labs (all labs ordered are listed, but only abnormal results are displayed) Labs Reviewed  CBC WITH DIFFERENTIAL/PLATELET - Abnormal; Notable for the following components:      Result Value   WBC 2.8 (*)    RBC 3.29 (*)    Hemoglobin 11.6 (*)    HCT 34.1 (*)    MCV 103.6 (*)    [facility]  35.3 (*)    RDW 19.0 (*)    Platelets 410 (*)    nRBC 1.1 (*)    Lymphs Abs 0.5 (*)    All other components within normal limits  COMPREHENSIVE METABOLIC PANEL - Abnormal; Notable for the following components:   Glucose, Bld 104 (*)    All other components within normal limits  URINALYSIS, ROUTINE W REFLEX MICROSCOPIC - Abnormal; Notable for the following components:   APPearance HAZY (*)    Leukocytes,Ua TRACE (*)    All other components within normal limits  URINE CULTURE  MAGNESIUM  TSH  TROPONIN I (HIGH SENSITIVITY)  TROPONIN I (HIGH SENSITIVITY)    EKG EKG Interpretation  Date/Time:  Monday Sep 16 2019 16:06:18 EDT Ventricular Rate:  118 PR Interval:    QRS Duration: 72 QT Interval:  304 QTC Calculation: 426 R Axis:   -26 Text Interpretation: Sinus tachycardia Probable left atrial enlargement Borderline left axis deviation When compared to prior, now tachycardia. NO STEMI Confirmed by Antony Blackbird [phone]) on 09/16/2019 4:19:30 PM   Radiology CT Angio Chest PE W and/or Wo Contrast  Result Date: 09/16/2019 CLINICAL DATA:  Lightheaded and tachycardia. EXAM: CT ANGIOGRAPHY CHEST WITH CONTRAST TECHNIQUE: Multidetector CT imaging of the chest was performed using the standard protocol during bolus administration of intravenous contrast. Multiplanar CT image reconstructions and MIPs were obtained to evaluate the vascular anatomy. CONTRAST:  131mL OMNIPAQUE IOHEXOL 350  MG/ML SOLN COMPARISON:  April 26, 2019 FINDINGS: Cardiovascular: There is marked severity calcification of the aortic arch. Satisfactory opacification of the pulmonary arteries to the segmental level. No evidence of pulmonary embolism. Normal heart size. No pericardial effusion. Mediastinum/Nodes: No enlarged mediastinal, hilar, or axillary lymph nodes. Thyroid gland, trachea, and esophagus demonstrate no significant findings. Lungs/Pleura: Mild linear scarring is seen along the anteromedial aspect of the right apex. A stable 1.0 cm noncalcified lung nodule is seen within the posteromedial aspect of the right lower lobe. A stable 4.3 cm x 2.6 cm lung mass is seen within the inferior medial aspect of the right upper lobe. This is along the anterior aspect of the right hilum. There is no evidence of acute infiltrate, pleural effusion or pneumothorax. Upper Abdomen: There is a small hiatal hernia. A subtle 1.0 cm x 0.7 cm area of parenchymal low attenuation is seen within the lateral aspect of the right lobe of the liver (axial CT image 65, CT series number 4). Musculoskeletal: Stable 2.2 cm x 1.8 cm and 1.5 cm x 0.9 cm cystic appearing areas are seen within the subcutaneous fat just below the lateral aspect of the right breast. Multilevel degenerative changes seen throughout the thoracic spine. Review of the MIP images confirms the above findings. IMPRESSION: 1. No evidence for pulmonary embolus. 2. Stable 4.3 cm x 2.6 cm right upper lobe lung mass. 3. Stable 1.0 cm noncalcified lung nodule within the posteromedial aspect of the right lower lobe. 4. Stable cystic appearing areas within the subcutaneous fat just below the lateral aspect of the right breast. Correlation with physical exam is recommended. 5. Small hiatal hernia. Aortic Atherosclerosis (ICD10-I70.0). Electronically Signed   By: Virgina Norfolk M.D.   On: 09/16/2019 18:36    Procedures Procedures (including critical care time)  Medications  Ordered in ED Medications  sodium chloride 0.9 % bolus 1,000 mL (0 mLs Intravenous Stopped 09/16/19 1816)  iohexol (OMNIPAQUE) 350 MG/ML injection 100 mL (100 mLs Intravenous Contrast Given 09/16/19 1819)    ED Course  I have reviewed  the triage vital signs and the nursing notes.  Pertinent labs & imaging results that were available during my care of the patient were reviewed by me and considered in my medical decision making (see chart for details).    MDM Rules/Calculators/A&P                      Janice Adams is a 64 y.o. female past medical history significant for hypertension, GERD, and lung cancer who presents from cancer center for tachycardia and lightheadedness.  Patient reports that she went to go see her oncologist today and was found to heart rate in the 140s.  She reports that she was feeling lightheaded but has not had any syncope.  She denies any chest pain, palpitations, or shortness of breath.  She does report some recent constipation which she treated with medication and has had better bowel movements.  She thinks she is staying fairly well hydrated.  She denies nausea, vomiting, or other complaints.  She denies any other recent medication changes.  She reports she is felt very fatigued and tired.  On exam, lungs are clear and chest is nontender.  Abdomen is nontender.  Patient is tachycardic between the 120s to 130s.  EKG showed sinus tachycardia with no SVT.  Abdomen and chest are nontender.  Legs are nontender nonedematous.  No focal neurologic deficits.  Patient resting comfortably on room air.  Patient's oxygen saturations were in the mid 90s but when I was speaking with her, they dipped to 90%.  Given the new hypoxia with her tachycardia and lightheadedness with current lung cancer, we will rule out a pulmonary embolism.  Chart review shows that patient is also scheduled to have a CT scan upcoming for assessment of her lung cancer, we will going get the CT PE study to not  only look for ruling out pneumonia, pleural embolism, and also may be helpful for her oncology team to see when they follow-up with her.  She will be given some fluids to help with the tachycardia.  We will also get screening labs and TSH.  Anticipate reassessment after work-up.  If work-up is reassuring, dissipate discharge home if she remains without hypoxia.  Patient CT scan showed no pulmonary embolism.  Other masses and abnormalities were stable from prior.  Patient's heart rate improved with fluids and she was feeling better.  She denies any fatigue or lightheadedness now.  Suspect mild dehydration.  Given otherwise reassuring work-up, including negative troponin x2, we feel she is safe for discharge home.  She will follow up with her oncology and PCP team.  She agrees with plan of care and return precautions.  Patient discharged in good condition with resolution of her presenting tachycardia.  Final Clinical Impression(s) / ED Diagnoses Final diagnoses:  Tachycardia  Fatigue, unspecified type  Dehydration    Rx / DC Orders ED Discharge Orders    None     Clinical Impression: 1. Tachycardia   2. Fatigue, unspecified type   3. Dehydration     Disposition: Discharge  Condition: Good  I have discussed the results, Dx and Tx plan with the pt(& family if present). He/she/they expressed understanding and agree(s) with the plan. Discharge instructions discussed at great length. Strict return precautions discussed and pt &/or family have verbalized understanding of the instructions. No further questions at time of discharge.    Discharge Medication List as of 09/16/2019  8:56 PM      Follow Up: Fulp, Cammie,  MD [address] [place] [address] [phone]     [facility] [place] [phone] mc [address] [place]       Rye Decoste, Gwenyth Allegra, MD 09/16/19 [phone]

## 2019-09-16 NOTE — ED Triage Notes (Signed)
Patient brought over from cancer center for HR 140 and asymptomatic. No cardiology on file. EKG in system.   Hx. SVT  A/Ox4

## 2019-09-17 LAB — URINE CULTURE: Culture: <10000 — AB

## 2019-09-25 ENCOUNTER — Encounter: Payer: Self-pay | Admitting: *Deleted

## 2019-10-04 ENCOUNTER — Ambulatory Visit (HOSPITAL_COMMUNITY)
Admission: RE | Admit: 2019-10-04 | Discharge: 2019-10-04 | Disposition: A | Discharge status: Home / Self Care | Payer: Medicaid Other | Source: Ambulatory Visit | Attending: Internal Medicine | Admitting: Internal Medicine

## 2019-10-04 ENCOUNTER — Other Ambulatory Visit: Payer: Self-pay

## 2019-10-04 ENCOUNTER — Inpatient Hospital Stay: Payer: Medicaid Other

## 2019-10-04 DIAGNOSIS — C3490 Malignant neoplasm of unspecified part of unspecified bronchus or lung: Secondary | ICD-10-CM | POA: Insufficient documentation

## 2019-10-04 DIAGNOSIS — Z5111 Encounter for antineoplastic chemotherapy: Secondary | ICD-10-CM | POA: Diagnosis not present

## 2019-10-04 LAB — CBC WITH DIFFERENTIAL (CANCER CENTER ONLY)
Abs Immature Granulocytes: 0.01 10*3/uL (ref 0.00–0.07)
Basophils Absolute: 0 10*3/uL (ref 0.0–0.1)
Basophils Relative: 1 %
Eosinophils Absolute: 0.1 10*3/uL (ref 0.0–0.5)
Eosinophils Relative: 2 %
HCT: 34.7 % — ABNORMAL LOW (ref 36.0–46.0)
Hemoglobin: 11.6 g/dL — ABNORMAL LOW (ref 12.0–15.0)
Immature Granulocytes: 0 %
Lymphocytes Relative: 16 %
Lymphs Abs: 0.6 10*3/uL — ABNORMAL LOW (ref 0.7–4.0)
MCH: 34.8 pg — ABNORMAL HIGH (ref 26.0–34.0)
MCHC: 33.4 g/dL (ref 30.0–36.0)
MCV: 104.2 fL — ABNORMAL HIGH (ref 80.0–100.0)
Monocytes Absolute: 0.3 10*3/uL (ref 0.1–1.0)
Monocytes Relative: 9 %
Neutro Abs: 2.8 10*3/uL (ref 1.7–7.7)
Neutrophils Relative %: 72 %
Platelet Count: 136 10*3/uL — ABNORMAL LOW (ref 150–400)
RBC: 3.33 MIL/uL — ABNORMAL LOW (ref 3.87–5.11)
RDW: 17.5 % — ABNORMAL HIGH (ref 11.5–15.5)
WBC Count: 3.9 10*3/uL — ABNORMAL LOW (ref 4.0–10.5)
nRBC: 0 % (ref 0.0–0.2)

## 2019-10-04 LAB — CMP (CANCER CENTER ONLY)
ALT: 10 U/L (ref 0–44)
AST: 15 U/L (ref 15–41)
Albumin: 3.7 g/dL (ref 3.5–5.0)
Alkaline Phosphatase: 104 U/L (ref 38–126)
Anion gap: 13 (ref 5–15)
BUN: 17 mg/dL (ref 8–23)
CO2: 22 mmol/L (ref 22–32)
Calcium: 9.9 mg/dL (ref 8.9–10.3)
Chloride: 105 mmol/L (ref 98–111)
Creatinine: 1.01 mg/dL — ABNORMAL HIGH (ref 0.44–1.00)
GFR, Est AFR Am: >60 mL/min (ref >60)
GFR, Estimated: 59 mL/min — ABNORMAL LOW (ref >60)
Glucose, Bld: 102 mg/dL — ABNORMAL HIGH (ref 70–99)
Potassium: 4.2 mmol/L (ref 3.5–5.1)
Sodium: 140 mmol/L (ref 135–145)
Total Bilirubin: 0.5 mg/dL (ref 0.3–1.2)
Total Protein: 8 g/dL (ref 6.5–8.1)

## 2019-10-04 IMAGING — CT CT CHEST W/ CM
2 of 3 series · 15 of 36 positions shown, 18 images · IV contrast (omnipaque)
Comparison: CT chest, [DATE], PET-CT, [DATE]

CLINICAL DATA: Follow-up lung cancer, status post chemotherapy and
radiation

EXAM:
CT CHEST WITH CONTRAST
TECHNIQUE: Multidetector CT imaging of the chest was performed during
intravenous contrast administration.
CONTRAST:  75mL OMNIPAQUE IOHEXOL 300 MG/ML  SOLN

[Series 2: axial st · axial · 0.77mm/px · z∈[+12,+256]mm · 12 of 144 slices shown, 15 images]
[im 11/144  mediastinal]
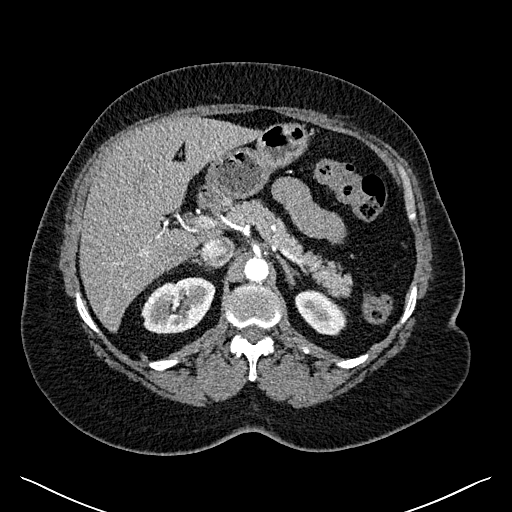
[im 11/144  lung]
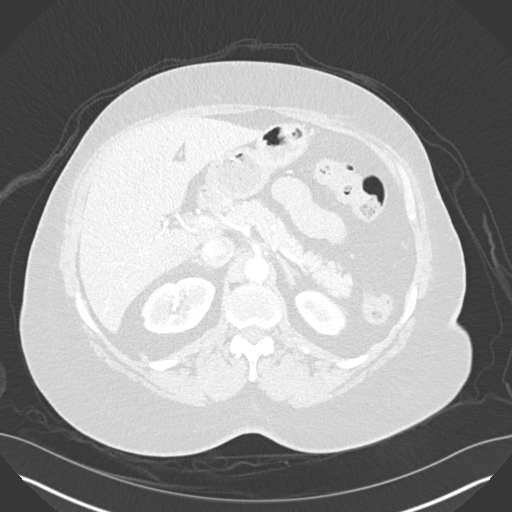
[im 22/144  lung]
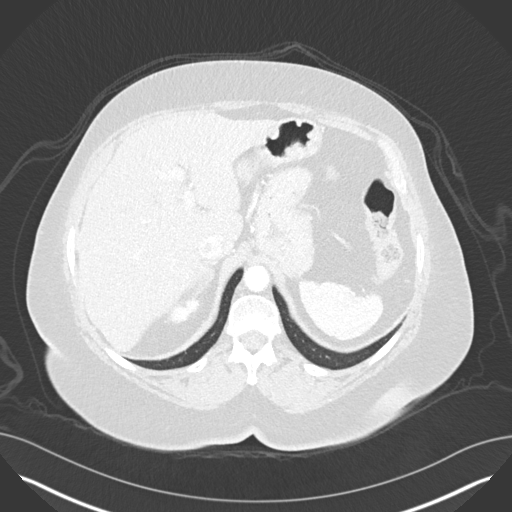
[im 32/144  lung]
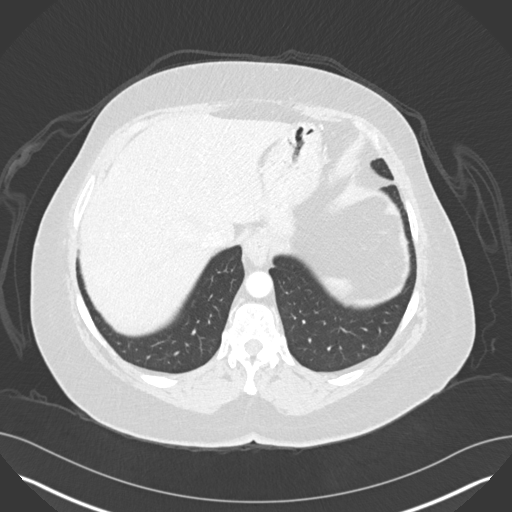
[im 43/144  lung]
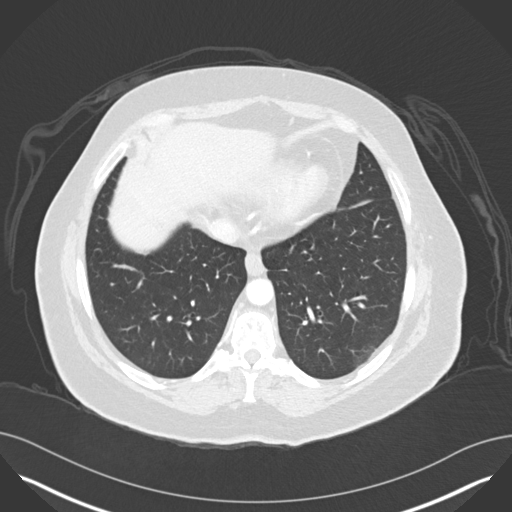
[im 53/144  mediastinal]
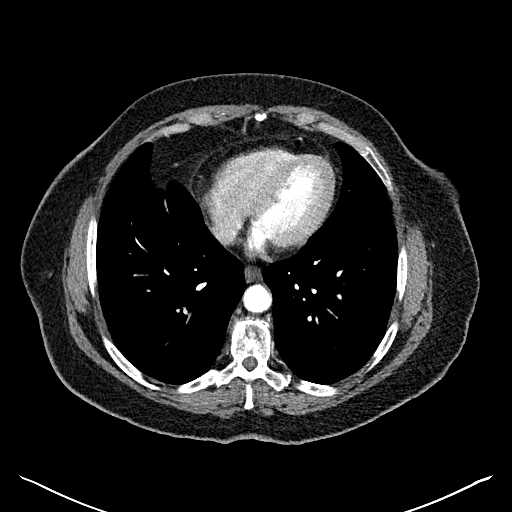
[im 53/144  lung]
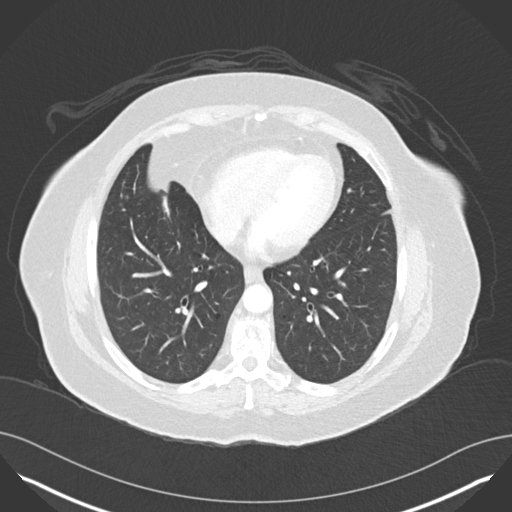
[im 64/144  lung]
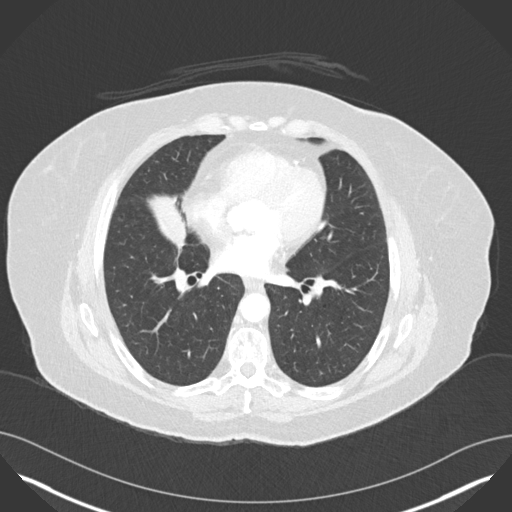
[im 80/144  lung]
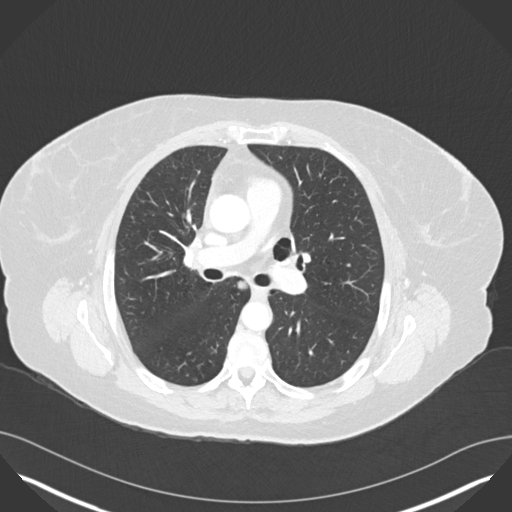
[im 91/144  lung]
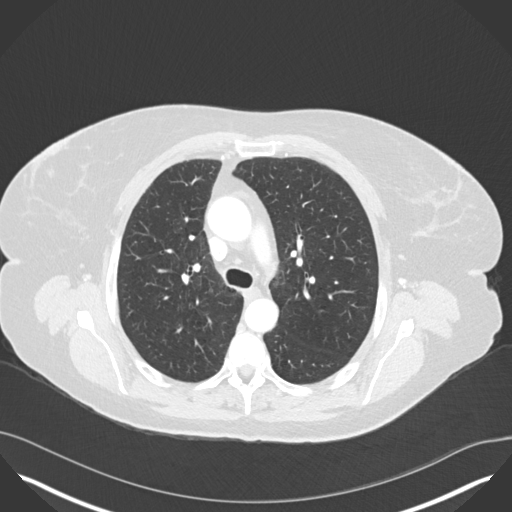
[im 101/144  mediastinal]
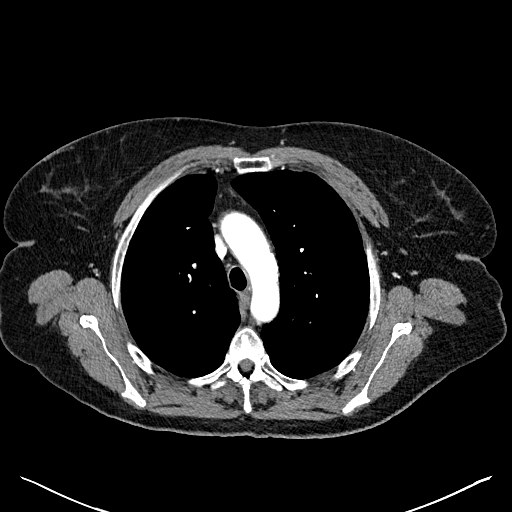
[im 101/144  lung]
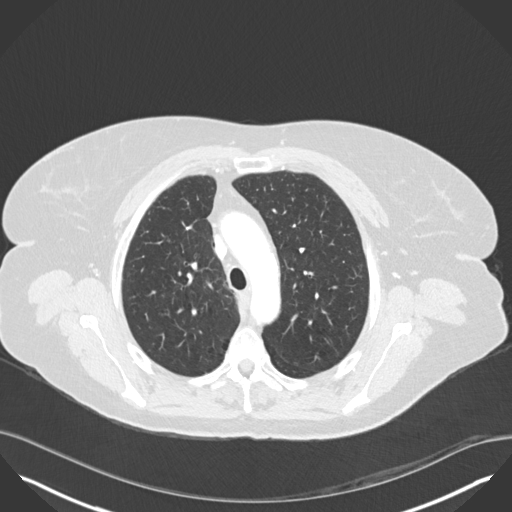
[im 112/144  lung]
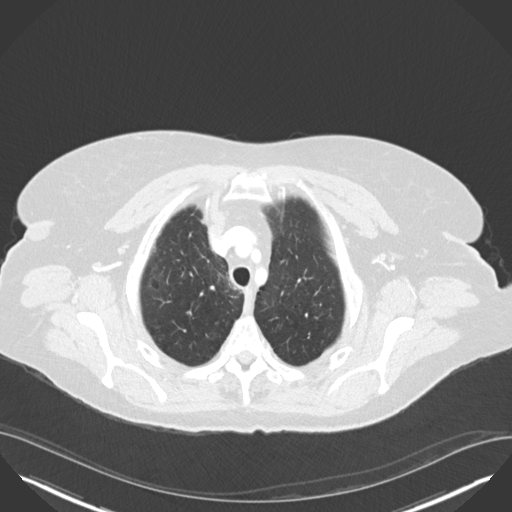
[im 122/144  lung]
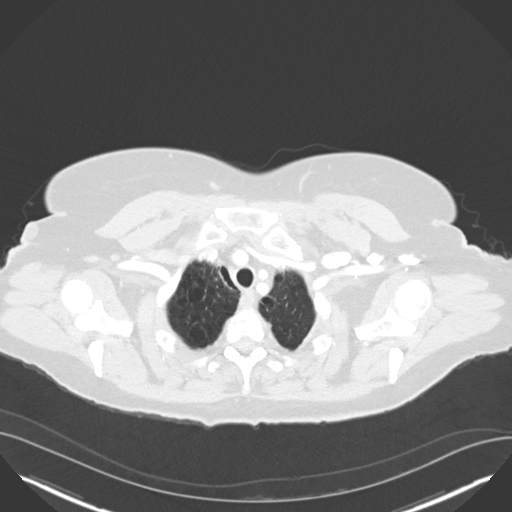
[im 133/144  lung]
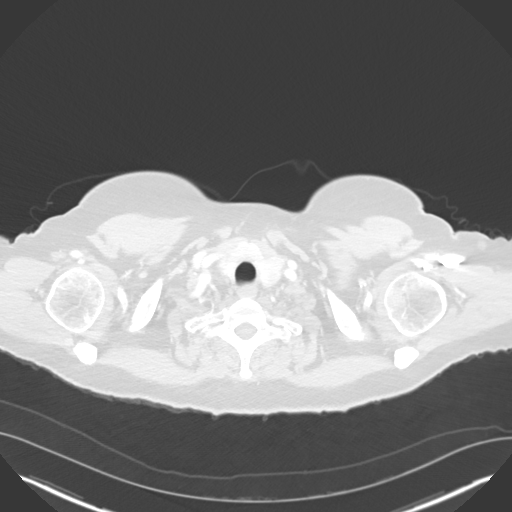

[Series 5: coronal · coronal · 0.58mm/px · 3 of 134 slices shown]
[im 27/134  lung]
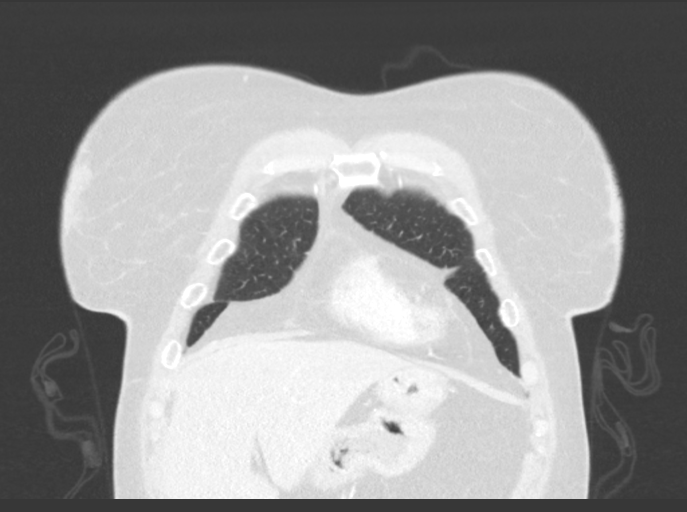
[im 54/134  lung]
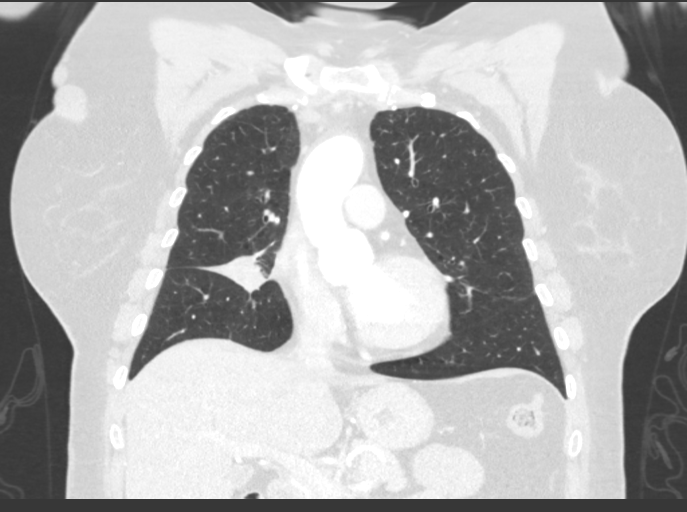
[im 80/134  lung]
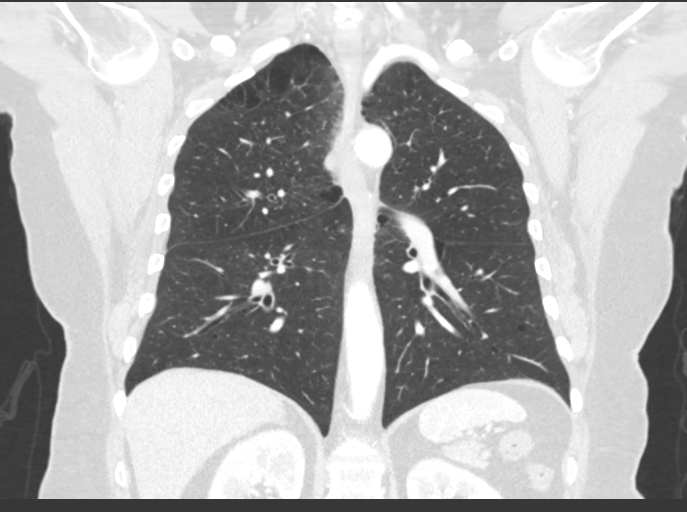

[15 of 36 positions shown; findings below may reference images not displayed]

FINDINGS: Cardiovascular: Aortic atherosclerosis. Normal heart size. No
pericardial effusion.

Mediastinum/Nodes: No enlarged mediastinal, hilar, or axillary lymph
nodes. Stable subcentimeter right hilar lymph node, previously FDG
avid (series 2, image 60). Thyroid gland, trachea, and esophagus
demonstrate no significant findings.

Lungs/Pleura: Moderate centrilobular emphysema. Continued interval
decrease in size of a right middle lobe mass with near total
atelectasis or consolidation of the right middle lobe, measuring
approximately 4.2 x 3.6 cm in axial extent, previously 4.7 x 3.5 cm
when measured similarly on examination dated [DATE] (series 3,
image 78). Unchanged subpleural nodule of the azygoesophageal recess
of the right lower lobe measuring 1.1 x 0.9 cm, not previously FDG
PET avid (series 2, image 68). No pleural effusion or pneumothorax.

Upper Abdomen: No acute abnormality.

Musculoskeletal: No chest wall mass or suspicious bone lesions
identified.
IMPRESSION: 1. Continued interval decrease in size of a right middle lobe mass
with near total atelectasis or consolidation of the right middle
lobe, consistent with continued treatment response of lung
malignancy.
2. Stable subcentimeter right hilar lymph node, previously FDG avid.
3. Unchanged subpleural nodule of the azygoesophageal recess of the
right lower lobe measuring 1.1 x 0.9 cm, not previously FDG PET
avid.
4. Emphysema ([YB]-[YB]).
5. Aortic Atherosclerosis ([YB]-[YB]).

## 2019-10-04 MED ORDER — IOHEXOL 300 MG/ML  SOLN
75.0000 mL | Freq: Once | INTRAMUSCULAR | Status: AC | PRN
  Administered 2019-10-04: 75 mL via INTRAVENOUS

## 2019-10-04 MED ORDER — SODIUM CHLORIDE (PF) 0.9 % IJ SOLN
INTRAMUSCULAR | Status: AC
  Filled 2019-10-04: qty 50

## 2019-10-06 NOTE — Progress Notes (Signed)
  Radiation Oncology         [phone] ________________________________  Name: Janice Adams MRN: [id number]  Date: 09/11/2019  DOB: 12-21-1955  End of Treatment Note  Diagnosis:  Lung cancer     Indication for treatment::  curative       Radiation treatment dates:   07/25/19 - 09/11/19  Site/dose:   The patient was treated to the disease within the right lung initially to a dose of 60 Gy using a 4 field, 3-D conformal technique. The patient then received a cone down boost treatment for an additional 6 Gy. This yielded a final total dose of 66 Gy.   Narrative: The patient tolerated radiation treatment relatively well.   The patient did experience esophagitis during the course of treatment which required management.   Plan: The patient has completed radiation treatment. The patient will return to radiation oncology clinic for routine followup in one month. I advised the patient to call or return sooner if they have any questions or concerns related to their recovery or treatment. ________________________________  Jodelle Gross, M.D., Ph.D.

## 2019-10-08 ENCOUNTER — Inpatient Hospital Stay: Payer: Medicaid Other | Admitting: Internal Medicine

## 2019-10-10 ENCOUNTER — Encounter: Payer: Self-pay | Admitting: Internal Medicine

## 2019-10-10 ENCOUNTER — Inpatient Hospital Stay: Payer: Medicaid Other | Attending: Internal Medicine | Admitting: Internal Medicine

## 2019-10-10 ENCOUNTER — Other Ambulatory Visit: Payer: Self-pay

## 2019-10-10 VITALS — BP 131/89 | HR 137 | Temp 97.3°F | Resp 18 | Ht 64.0 in | Wt 192.7 lb

## 2019-10-10 DIAGNOSIS — C342 Malignant neoplasm of middle lobe, bronchus or lung: Secondary | ICD-10-CM | POA: Diagnosis not present

## 2019-10-10 DIAGNOSIS — Z5112 Encounter for antineoplastic immunotherapy: Secondary | ICD-10-CM | POA: Insufficient documentation

## 2019-10-10 DIAGNOSIS — I10 Essential (primary) hypertension: Secondary | ICD-10-CM | POA: Diagnosis not present

## 2019-10-10 DIAGNOSIS — Z79899 Other long term (current) drug therapy: Secondary | ICD-10-CM | POA: Insufficient documentation

## 2019-10-10 DIAGNOSIS — Z923 Personal history of irradiation: Secondary | ICD-10-CM | POA: Insufficient documentation

## 2019-10-10 DIAGNOSIS — R000 Tachycardia, unspecified: Secondary | ICD-10-CM | POA: Insufficient documentation

## 2019-10-10 NOTE — Progress Notes (Signed)
[facility] [phone]   [phone]  OFFICE PROGRESS NOTE  Antony Blackbird, MD [place] [address]  DIAGNOSIS: Stage IIIb (T3, M2, M0) non-small cell lung cancer, adenocarcinoma presented with right hilar mass with occlusion of the right middle lobe bronchus centrally with right middle lobe collapse diagnosed in January 2021.   PRIOR THERAPY: Concurrent chemoradiation with weekly carboplatin for AUC of 2 and paclitaxel 45 mg/M2.  First dose expected on July 09, 2019.  Status post 8 cycles.  Last dose was given Sep 09, 2019.  CURRENT THERAPY: Consolidation treatment with immunotherapy with Imfinzi 1500 mg IV every 4 weeks.  First dose 10/17/2019.  INTERVAL HISTORY: Janice Adams 64 y.o. female returns to the clinic today for follow-up visit.  The patient is feeling fine today with no concerning complaints.  She continues to have tachycardia.  She was evaluated at the emergency room few weeks ago had no concerning findings were seen on her cardiac work-up.  She denied having any chest pain, shortness of breath, cough or hemoptysis.  She denied having any fever or chills.  She has no nausea, vomiting, diarrhea or constipation.  She has no headache or visual changes.  The patient had repeat CT scan of the chest performed recently and she is here for evaluation and discussion of her scan results and treatment options.  MEDICAL HISTORY: Past Medical History:  Diagnosis Date   Back pain    GERD (gastroesophageal reflux disease)    Gout    no current problems per patient 05/31/19   History of epigastric pain    comes and goes   Hypertension    no meds    ALLERGIES:  has No Known Allergies.  MEDICATIONS:  Current Outpatient Medications  Medication Sig Dispense Refill   omeprazole (PRILOSEC) 20 MG capsule Take 1 capsule (20 mg total) by mouth 2 (two) times daily before a meal. To lower stomach acid 60 capsule 5    naproxen sodium (ALEVE) 220 MG tablet Take 440 mg by mouth 2 (two) times daily as needed (back pain.).     prochlorperazine (COMPAZINE) 10 MG tablet Take 1 tablet (10 mg total) by mouth every 6 (six) hours as needed for nausea or vomiting. (Patient not taking: Reported on 10/10/2019) 30 tablet 1   No current facility-administered medications for this visit.    SURGICAL HISTORY:  Past Surgical History:  Procedure Laterality Date   MULTIPLE TOOTH EXTRACTIONS     TONSILLECTOMY     uterine ablation     VIDEO BRONCHOSCOPY WITH ENDOBRONCHIAL ULTRASOUND N/A 06/04/2019   Procedure: VIDEO BRONCHOSCOPY WITH ENDOBRONCHIAL ULTRASOUND;  Surgeon: Margaretha Seeds, MD;  Location: [facility];  Service: Thoracic;  Laterality: N/A;   WISDOM TOOTH EXTRACTION      REVIEW OF SYSTEMS:  Constitutional: positive for fatigue Eyes: negative Ears, nose, mouth, throat, and face: negative Respiratory: negative Cardiovascular: negative Gastrointestinal: negative Genitourinary:negative Integument/breast: negative Hematologic/lymphatic: negative Musculoskeletal:negative Neurological: negative Behavioral/Psych: negative Endocrine: negative Allergic/Immunologic: negative   PHYSICAL EXAMINATION: General appearance: alert, cooperative, fatigued and no distress Head: Normocephalic, without obvious abnormality, atraumatic Neck: no adenopathy, no JVD, supple, symmetrical, trachea midline and thyroid not enlarged, symmetric, no tenderness/mass/nodules Lymph nodes: Cervical, supraclavicular, and axillary nodes normal. Resp: clear to auscultation bilaterally Back: symmetric, no curvature. ROM normal. No CVA tenderness. Cardio: Tachycardic GI: soft, non-tender; bowel sounds normal; no masses,  no organomegaly Extremities: extremities normal, atraumatic, no cyanosis or edema Neurologic: Alert  and oriented X 3, normal strength and tone. Normal symmetric reflexes. Normal coordination and gait  ECOG PERFORMANCE STATUS: 1  - Symptomatic but completely ambulatory  Blood pressure 131/89, pulse (!) 137, temperature (!) 97.3 F (36.3 C), temperature source Temporal, resp. rate 18, height 5\' 4"  (1.626 m), weight 192 lb 11.2 oz (87.4 kg), SpO2 98 %.  LABORATORY DATA: Lab Results  Component Value Date   WBC 3.9 (L) 10/04/2019   HGB 11.6 (L) 10/04/2019   HCT 34.7 (L) 10/04/2019   MCV 104.2 (H) 10/04/2019   PLT 136 (L) 10/04/2019      Chemistry      Component Value Date/Time   NA 140 10/04/2019 1410   NA 146 (H) 03/06/2018 1128   K 4.2 10/04/2019 1410   CL 105 10/04/2019 1410   CO2 22 10/04/2019 1410   BUN 17 10/04/2019 1410   BUN 8 03/06/2018 1128   CREATININE 1.01 (H) 10/04/2019 1410   CREATININE 0.85 05/11/2016 1157      Component Value Date/Time   CALCIUM 9.9 10/04/2019 1410   ALKPHOS 104 10/04/2019 1410   AST 15 10/04/2019 1410   ALT 10 10/04/2019 1410   BILITOT 0.5 10/04/2019 1410       RADIOGRAPHIC STUDIES: CT Chest W Contrast  Result Date: 10/04/2019 CLINICAL DATA:  Follow-up lung cancer, status post chemotherapy and radiation EXAM: CT CHEST WITH CONTRAST TECHNIQUE: Multidetector CT imaging of the chest was performed during intravenous contrast administration. CONTRAST:  41mL OMNIPAQUE IOHEXOL 300 MG/ML  SOLN COMPARISON:  CT chest, 09/16/2019, PET-CT, 06/11/1999 FINDINGS: Cardiovascular: Aortic atherosclerosis. Normal heart size. No pericardial effusion. Mediastinum/Nodes: No enlarged mediastinal, hilar, or axillary lymph nodes. Stable subcentimeter right hilar lymph node, previously FDG avid (series 2, image 60). Thyroid gland, trachea, and esophagus demonstrate no significant findings. Lungs/Pleura: Moderate centrilobular emphysema. Continued interval decrease in size of a right middle lobe mass with near total atelectasis or consolidation of the right middle lobe, measuring approximately 4.2 x 3.6 cm in axial extent, previously 4.7 x 3.5 cm when measured similarly on examination dated  09/16/2019 (series 3, image 78). Unchanged subpleural nodule of the azygoesophageal recess of the right lower lobe measuring 1.1 x 0.9 cm, not previously FDG PET avid (series 2, image 68). No pleural effusion or pneumothorax. Upper Abdomen: No acute abnormality. Musculoskeletal: No chest wall mass or suspicious bone lesions identified. IMPRESSION: 1. Continued interval decrease in size of a right middle lobe mass with near total atelectasis or consolidation of the right middle lobe, consistent with continued treatment response of lung malignancy. 2. Stable subcentimeter right hilar lymph node, previously FDG avid. 3. Unchanged subpleural nodule of the azygoesophageal recess of the right lower lobe measuring 1.1 x 0.9 cm, not previously FDG PET avid. 4. Emphysema (ICD10-J43.9). 5. Aortic Atherosclerosis (ICD10-I70.0). Electronically Signed   By: Eddie Candle M.D.   On: 10/04/2019 16:26   CT Angio Chest PE W and/or Wo Contrast  Result Date: 09/16/2019 CLINICAL DATA:  Lightheaded and tachycardia. EXAM: CT ANGIOGRAPHY CHEST WITH CONTRAST TECHNIQUE: Multidetector CT imaging of the chest was performed using the standard protocol during bolus administration of intravenous contrast. Multiplanar CT image reconstructions and MIPs were obtained to evaluate the vascular anatomy. CONTRAST:  11mL OMNIPAQUE IOHEXOL 350 MG/ML SOLN COMPARISON:  April 26, 2019 FINDINGS: Cardiovascular: There is marked severity calcification of the aortic arch. Satisfactory opacification of the pulmonary arteries to the segmental level. No evidence of pulmonary embolism. Normal heart size. No pericardial effusion. Mediastinum/Nodes: No enlarged mediastinal,  hilar, or axillary lymph nodes. Thyroid gland, trachea, and esophagus demonstrate no significant findings. Lungs/Pleura: Mild linear scarring is seen along the anteromedial aspect of the right apex. A stable 1.0 cm noncalcified lung nodule is seen within the posteromedial aspect of the  right lower lobe. A stable 4.3 cm x 2.6 cm lung mass is seen within the inferior medial aspect of the right upper lobe. This is along the anterior aspect of the right hilum. There is no evidence of acute infiltrate, pleural effusion or pneumothorax. Upper Abdomen: There is a small hiatal hernia. A subtle 1.0 cm x 0.7 cm area of parenchymal low attenuation is seen within the lateral aspect of the right lobe of the liver (axial CT image 65, CT series number 4). Musculoskeletal: Stable 2.2 cm x 1.8 cm and 1.5 cm x 0.9 cm cystic appearing areas are seen within the subcutaneous fat just below the lateral aspect of the right breast. Multilevel degenerative changes seen throughout the thoracic spine. Review of the MIP images confirms the above findings. IMPRESSION: 1. No evidence for pulmonary embolus. 2. Stable 4.3 cm x 2.6 cm right upper lobe lung mass. 3. Stable 1.0 cm noncalcified lung nodule within the posteromedial aspect of the right lower lobe. 4. Stable cystic appearing areas within the subcutaneous fat just below the lateral aspect of the right breast. Correlation with physical exam is recommended. 5. Small hiatal hernia. Aortic Atherosclerosis (ICD10-I70.0). Electronically Signed   By: Virgina Norfolk M.D.   On: 09/16/2019 18:36    ASSESSMENT AND PLAN: This is a very pleasant 64 years old African-American female diagnosed with stage IIIb (T3, N2, M0) non-small cell lung cancer, adenocarcinoma presented with right hilar mass with occlusion of the right middle lobe bronchus as well as right middle lobe collapse in addition to right lower lobe pulmonary nodule diagnosed in January 2021. She completed a course of concurrent chemoradiation with weekly carboplatin and paclitaxel status post 8 cycles.   She tolerated her treatment well except for fatigue. The patient had repeat CT scan of the chest performed recently.  I personally and independently reviewed the scan images and discussed the result and showed  the images to the patient today.  Her scan showed improvement of her disease with significant decrease in the consolidative lesion in the right upper lobe. I gave the patient the option of observation versus consideration of consolidation treatment with immunotherapy with Imfinzi 1500 mg IV every 4 weeks.  The patient is interested in treatment.  I discussed with her the adverse effect of the immunotherapy including but not limited to immunotherapy mediated skin rash, diarrhea, inflammation of the lung, kidney, liver, thyroid or other endocrine dysfunction. She is expected to start the first cycle of this treatment next week. She will come back for follow-up visit in 5 weeks with the start of cycle #2. For the tachycardia, she had EKG in the past that showed sinus tachycardia and the patient was evaluated at the emergency department with no concerning cardiac dysfunction.  I will refer her to cardiology for further evaluation and recommendation regarding her condition. She was advised to call immediately if she has any other concerning symptoms in the interval. The patient voices understanding of current disease status and treatment options and is in agreement with the current care plan.  All questions were answered. The patient knows to call the clinic with any problems, questions or concerns. We can certainly see the patient much sooner if necessary.  Disclaimer: This note was dictated  with voice recognition software. Similar sounding words can inadvertently be transcribed and may not be corrected upon review.

## 2019-10-14 ENCOUNTER — Telehealth: Payer: Self-pay | Admitting: Medical Oncology

## 2019-10-14 NOTE — Telephone Encounter (Signed)
LM to return my call. Son called me back and asked if she is a surgical candidate.  Mohamed did not think so because of location of tumor would likely require a pneumonectomy.  Dr Julien Nordmann will refer her back to Dr Roxan Hockey for consultation if she agrees. Son will call back tomorrow.

## 2019-10-17 ENCOUNTER — Telehealth: Payer: Self-pay | Admitting: Medical Oncology

## 2019-10-17 NOTE — Telephone Encounter (Signed)
I told pt to keep appt with cardiologist.

## 2019-10-25 ENCOUNTER — Telehealth: Payer: Self-pay | Admitting: Medical Oncology

## 2019-10-25 NOTE — Telephone Encounter (Signed)
June 29 th cardiologist appt.  She is unclear about her treatment and "scraping her lungs" She was not scheduled per last LOS.  10/10/2019 [id number] Curt Bears, MD  Dispositions: 0 Return in about 1 week (around 10/17/2019).  Check-out note:  Lab and chemotherapy every 4 weeks start 10/17/2019. Follow-up visit in 5 weeks and then every 4 weeks with chemotherapy.  Referral to cardiology.   I sent schedule message for appt next week.  Her first immunotherapy is scheduled for July 8th.

## 2019-10-31 ENCOUNTER — Telehealth: Payer: Self-pay | Admitting: Internal Medicine

## 2019-10-31 NOTE — Telephone Encounter (Signed)
Scheduled appt per 6/18 sch message - pt is aware of appt date and time.

## 2019-11-02 NOTE — Progress Notes (Signed)
[facility] OFFICE PROGRESS NOTE  Antony Blackbird, MD [place] [address]  DIAGNOSIS: Stage IIIb (T3, M2, M0) non-small cell lung cancer, adenocarcinoma presented with right hilar mass with occlusion of the right middle lobe bronchus centrally with right middle lobe collapse diagnosed in January 2021.   PRIOR THERAPY: Concurrent chemoradiation with weekly carboplatin for AUC of 2 and paclitaxel 45 mg/M2.  First dose expected on July 09, 2019.  Status post 8 cycles.  Last dose was given Sep 09, 2019.  CURRENT THERAPY: Consolidation treatment with immunotherapy with Imfinzi 1500 mg IV every 4 weeks.  First dose 11/04/2019.  INTERVAL HISTORY: Janice Adams 64 y.o. female returns to the clinic for a follow up visit. The patient is feeling well today without any concerning complaints. In the interval since her last visit, she was referred to cardiology for sinus tachycardia. She has an appointment with them scheduled for 12/05/19.    The patient is here today for her first cycle of consolidation immunotherapy with imfinzi. Dr. Julien Nordmann does not believe she will be a surgical candidate but will reach out to Dr. Roxan Hockey to review her images. Today, her main symptom is fatigue. She denies any fever, chills, night sweats, or weight loss. Denies any chest pain, shortness of breath, cough, or hemoptysis. Denies any nausea, vomiting, diarrhea, or constipation. Denies any headache or visual changes. Denies any rashes or skin changes. The patient is here today for evaluation prior to starting cycle # 1  MEDICAL HISTORY: Past Medical History:  Diagnosis Date  . Back pain   . GERD (gastroesophageal reflux disease)   . Gout    no current problems per patient 05/31/19  . History of epigastric pain    comes and goes  . Hypertension    no meds    ALLERGIES:  has No Known Allergies.  MEDICATIONS:  Current Outpatient Medications  Medication Sig Dispense Refill  .  naproxen sodium (ALEVE) 220 MG tablet Take 440 mg by mouth 2 (two) times daily as needed (back pain.).    Marland Kitchen omeprazole (PRILOSEC) 20 MG capsule Take 1 capsule (20 mg total) by mouth 2 (two) times daily before a meal. To lower stomach acid 60 capsule 5  . prochlorperazine (COMPAZINE) 10 MG tablet Take 1 tablet (10 mg total) by mouth every 6 (six) hours as needed for nausea or vomiting. 30 tablet 1   No current facility-administered medications for this visit.   Facility-Administered Medications Ordered in Other Visits  Medication Dose Route Frequency Provider Last Rate Last Admin  . durvalumab (IMFINZI) 1,500 mg in sodium chloride 0.9 % 100 mL chemo infusion  1,500 mg Intravenous Once Curt Bears, MD        SURGICAL HISTORY:  Past Surgical History:  Procedure Laterality Date  . MULTIPLE TOOTH EXTRACTIONS    . TONSILLECTOMY    . uterine ablation    . VIDEO BRONCHOSCOPY WITH ENDOBRONCHIAL ULTRASOUND N/A 06/04/2019   Procedure: VIDEO BRONCHOSCOPY WITH ENDOBRONCHIAL ULTRASOUND;  Surgeon: Margaretha Seeds, MD;  Location: [facility];  Service: Thoracic;  Laterality: N/A;  . WISDOM TOOTH EXTRACTION      REVIEW OF SYSTEMS:   Review of Systems  Constitutional: Positive for fatigue. Negative for appetite change, chills, fever and unexpected weight change.  HENT: Negative for mouth sores, nosebleeds, sore throat and trouble swallowing.   Eyes: Negative for eye problems and icterus.  Respiratory: Negative for cough, hemoptysis, shortness of breath and wheezing.   Cardiovascular: Negative for  chest pain and leg swelling.  Gastrointestinal: Negative for abdominal pain, constipation, diarrhea, nausea and vomiting.  Genitourinary: Negative for bladder incontinence, difficulty urinating, dysuria, frequency and hematuria.   Musculoskeletal: Negative for back pain, gait problem, neck pain and neck stiffness.  Skin: Negative for itching and rash.  Neurological: Negative for dizziness, extremity weakness,  gait problem, headaches, light-headedness and seizures.  Hematological: Negative for adenopathy. Does not bruise/bleed easily.  Psychiatric/Behavioral: Negative for confusion, depression and sleep disturbance. The patient is not nervous/anxious.     PHYSICAL EXAMINATION:  Blood pressure 126/75, pulse 96, temperature (!) 97.3 F (36.3 C), temperature source Temporal, resp. rate 18, height 5\' 4"  (1.626 m), weight 192 lb 3.2 oz (87.2 kg), SpO2 93 %.  ECOG PERFORMANCE STATUS: 1 - Symptomatic but completely ambulatory  Physical Exam  Constitutional: Oriented to person, place, and time and well-developed, well-nourished, and in no distress.  HENT:  Head: Normocephalic and atraumatic.  Mouth/Throat: Oropharynx is clear and moist. No oropharyngeal exudate.  Eyes: Conjunctivae are normal. Right eye exhibits no discharge. Left eye exhibits no discharge. No scleral icterus.  Neck: Normal range of motion. Neck supple.  Cardiovascular: Normal rate, regular rhythm, normal heart sounds and intact distal pulses.   Pulmonary/Chest: Effort normal and breath sounds normal. No respiratory distress. No wheezes. No rales.  Abdominal: Soft. Bowel sounds are normal. Exhibits no distension and no mass. There is no tenderness.  Musculoskeletal: Normal range of motion. Exhibits no edema.  Lymphadenopathy:    No cervical adenopathy.  Neurological: Alert and oriented to person, place, and time. Exhibits normal muscle tone. Gait normal. Coordination normal.  Skin: Skin is warm and dry. No rash noted. Not diaphoretic. No erythema. No pallor.  Psychiatric: Mood, memory and judgment normal.  Vitals reviewed.  LABORATORY DATA: Lab Results  Component Value Date   WBC 3.8 (L) 11/04/2019   HGB 12.3 11/04/2019   HCT 36.9 11/04/2019   MCV 107.0 (H) 11/04/2019   PLT 362 11/04/2019      Chemistry      Component Value Date/Time   NA 141 11/04/2019 1054   NA 146 (H) 03/06/2018 1128   K 4.3 11/04/2019 1054   CL 107  11/04/2019 1054   CO2 23 11/04/2019 1054   BUN 15 11/04/2019 1054   BUN 8 03/06/2018 1128   CREATININE 1.17 (H) 11/04/2019 1054   CREATININE 0.85 05/11/2016 1157      Component Value Date/Time   CALCIUM 9.8 11/04/2019 1054   ALKPHOS 99 11/04/2019 1054   AST 18 11/04/2019 1054   ALT 13 11/04/2019 1054   BILITOT 0.7 11/04/2019 1054       RADIOGRAPHIC STUDIES:  No results found.   ASSESSMENT/PLAN:  This is a very pleasant 64 year old African-American female diagnosed with stage IIIb (T3, N2, M0) non-small cell lung cancer, adenocarcinoma presented with right hilar mass with occlusion of the right middle lobe bronchus as well as right middle lobe collapse in addition to right lower lobe pulmonary nodule diagnosed in January 2021.  She completed a course of concurrent chemoradiation with weekly carboplatin and paclitaxel status post 8 cycles.   She tolerated her treatment well except for fatigue.  The patient is scheduled to being consolidation immunotherapy with Imfinzi 1500 mg IV every 4 weeks. She is scheduled to start her first treatment today. Dr. Julien Nordmann does not believe she is a surgical candidate but may reach out to Dr. Roxan Hockey, cardiothoracic surgeon for his recommendation.   Labs were reviewed. Recommend that  she proceed with cycle #1 today as scheduled.   We will see her back for a follow up visit in 4 weeks for evaluation before starting cycle #2.   I discussed with the patient that I would personally call her if there was a change in her treatment plan after discussing with surgery.   The patient was advised to call immediately if she has any concerning symptoms in the interval. The patient voices understanding of current disease status and treatment options and is in agreement with the current care plan. All questions were answered. The patient knows to call the clinic with any problems, questions or concerns. We can certainly see the patient much sooner if  necessary    No orders of the defined types were placed in this encounter.    Fin Hupp L Curtez Brallier, PA-C 11/04/19

## 2019-11-03 NOTE — Progress Notes (Signed)
Cardiology Office Note:   Date:  11/05/2019  NAME:  Janice Adams    MRN: [id number] DOB:  May 22, 1955   PCP:  Antony Blackbird, MD  Cardiologist:  No primary care provider on file.   Referring MD: Curt Bears, MD   Chief Complaint  Patient presents with  . Tachycardia   History of Present Illness:   Janice Adams is a 64 y.o. female with a hx of Lung CA, HTN, GERD who is being seen today for the evaluation of tachycardia at the request of Curt Bears, MD. Evaluated by oncologist 5/10 and sent to ER for tachycardia. In the ER, CT PE study negative. Troponin negative x 2. EKG with sinus tachycardia. TSH 3.4.   She presents for further evaluation of sinus tachycardia.  Her EKG demonstrates sinus tachycardia, heart rate 106 bpm.  She does have left axis deviation but no other significant abnormalities.  There are no acute ST-T changes concerning for ischemia or infarction.  She reports that her heart rate has been high since starting chemotherapy and radiation therapy for her lung cancer.  This started in February of this year.  She is had the above work-up which includes a negative CT PE study and normal thyroid studies.  She reports that she drinks 2-3 bottles of water per day.  This is 12 ounce bottles of water.  This is not enough.  She does drink 2-3 caffeinated tea beverages per day.  She denies any smoking or drug use.  She does not use energy drinks.  She reports she does not feel her heart racing or skipping beats.  She reports she is unaware of her heart beating fast.  She denies any pain.  Her most recent hemoglobin value was 12.3.  She never had any trouble with fast heart prior to this.  She reports that she does exercise including walking around her neighborhood without any limitations such as chest pain, shortness of breath or palpitations.  She does report intermittent dizziness upon standing.  I suspect this is all dehydration.  Problem List 1. 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 HTN 3. GERD  Past Medical History: Past Medical History:  Diagnosis Date  . Back pain   . GERD (gastroesophageal reflux disease)   . Gout    no current problems per patient 05/31/19  . History of epigastric pain    comes and goes  . Hypertension    no meds    Past Surgical History: Past Surgical History:  Procedure Laterality Date  . MULTIPLE TOOTH EXTRACTIONS    . TONSILLECTOMY    . uterine ablation    . VIDEO BRONCHOSCOPY WITH ENDOBRONCHIAL ULTRASOUND N/A 06/04/2019   Procedure: VIDEO BRONCHOSCOPY WITH ENDOBRONCHIAL ULTRASOUND;  Surgeon: Margaretha Seeds, MD;  Location: [facility] OR;  Service: Thoracic;  Laterality: N/A;  . WISDOM TOOTH EXTRACTION      Current Medications: Current Meds  Medication Sig  . naproxen sodium (ALEVE) 220 MG tablet Take 440 mg by mouth 2 (two) times daily as needed (back pain.).  Marland Kitchen omeprazole (PRILOSEC) 20 MG capsule Take 1 capsule (20 mg total) by mouth 2 (two) times daily before a meal. To lower stomach acid  . prochlorperazine (COMPAZINE) 10 MG tablet Take 1 tablet (10 mg total) by mouth every 6 (six) hours as needed for nausea or vomiting.     Allergies:    Patient has no known allergies.   Social History: Social History   Socioeconomic History  . Marital status:  Divorced    Spouse name: Not on file  . Number of children: 5  . Years of education: Not on file  . Highest education level: Not on file  Occupational History  . Occupation: Childcare  Tobacco Use  . Smoking status: Former Smoker    Packs/day: 0.50    Years: 47.00    Pack years: 23.50    Types: Cigarettes    Quit date: 07/07/2019    Years since quitting: 0.3  . Smokeless tobacco: Never Used  Vaping Use  . Vaping Use: Never used  Substance and Sexual Activity  . Alcohol use: No    Alcohol/week: 0.0 standard drinks  . Drug use: No    Comment: last use Wed 05/28/19  . Sexual activity: Not on file  Other Topics Concern  . Not on file  Social  History Narrative  . Not on file   Social Determinants of Health   Financial Resource Strain:   . Difficulty of Paying Living Expenses:   Food Insecurity:   . Worried About Charity fundraiser in the Last Year:   . Arboriculturist in the Last Year:   Transportation Needs:   . Film/video editor (Medical):   Marland Kitchen Lack of Transportation (Non-Medical):   Physical Activity:   . Days of Exercise per Week:   . Minutes of Exercise per Session:   Stress:   . Feeling of Stress :   Social Connections:   . Frequency of Communication with Friends and Family:   . Frequency of Social Gatherings with Friends and Family:   . Attends Religious Services:   . Active Member of Clubs or Organizations:   . Attends Archivist Meetings:   Marland Kitchen Marital Status:      Family History: The patient's family history includes Breast cancer in her daughter, maternal grandmother, and mother; Cancer in her daughter and mother; Heart attack in her brother; Seizures in her father.  ROS:   All other ROS reviewed and negative. Pertinent positives noted in the HPI.     EKGs/Labs/Other Studies Reviewed:   The following studies were personally reviewed by me today:  EKG:  EKG is ordered today.  The ekg ordered today demonstrates sinus tachycardia, heart rate 106 no acute ST-T changes, no evidence of prior infarction, and was personally reviewed by me.   Recent Labs: 09/16/2019: Magnesium 1.8 11/04/2019: ALT 13; BUN 15; Creatinine 1.17; Hemoglobin 12.3; Platelet Count 362; Potassium 4.3; Sodium 141; TSH 1.474   Recent Lipid Panel    Component Value Date/Time   CHOL 218 (H) 03/06/2018 1128   TRIG 130 03/06/2018 1128   HDL 55 03/06/2018 1128   CHOLHDL 4.0 03/06/2018 1128   CHOLHDL 3.7 07/21/2014 0946   VLDL 22 07/21/2014 0946   LDLCALC 137 (H) 03/06/2018 1128    Physical Exam:   VS:  BP 140/80   Pulse (!) 106   Ht 5\' 4"  (1.626 m)   Wt 191 lb 6.4 oz (86.8 kg)   LMP  (LMP Unknown)   BMI 32.85 kg/m     Wt Readings from Last 3 Encounters:  11/05/19 191 lb 6.4 oz (86.8 kg)  11/04/19 192 lb 3.2 oz (87.2 kg)  10/10/19 192 lb 11.2 oz (87.4 kg)    General: Well nourished, well developed, in no acute distress Heart: Atraumatic, normal size  Eyes: PEERLA, EOMI  Neck: Supple, no JVD Endocrine: No thryomegaly Cardiac: Normal S1, S2; RRR; no murmurs, rubs, or gallops Lungs: Clear  to auscultation bilaterally, no wheezing, rhonchi or rales  Abd: Soft, nontender, no hepatomegaly  Ext: No edema, pulses 2+ Musculoskeletal: No deformities, BUE and BLE strength normal and equal Skin: Warm and dry, no rashes   Neuro: Alert and oriented to person, place, time, and situation, CNII-XII grossly intact, no focal deficits  Psych: Normal mood and affect   ASSESSMENT:   Janice Adams is a 64 y.o. female who presents for the following: 1. Sinus tachycardia     PLAN:   1. Tachycardia -EKG with sinus tachycardia.  I did review her EKGs from the hospital which also demonstrates sinus tachycardia.  CT PE study negative.  Thyroid studies normal.  She is not drinking of water.  Have asked her to triple amount of water she is drinking.  I suspect her symptoms are related to dehydration.  She is not anemic.  She is on no medications that would cause this.  Possibly this is a side effect of chemotherapy and radiation.  We will go ahead and proceed with an echocardiogram just to ensure that her heart structurally normal.  She has no evidence of cardiovascular disease on examination.  There are no murmurs rubs or gallops.  We will go ahead and make sure heart structurally normal and she will see [place] as needed.  I have encouraged her to continue with adequate hydration.  I suspect her tachycardia will improve when she completes chemo and radiation.  She also reports significant stress and anxiety with the diagnosis of cancer and then seeing [place] today.  We will see her as needed.  Disposition: Return if symptoms worsen or  fail to improve.  Medication Adjustments/Labs and Tests Ordered: Current medicines are reviewed at length with the patient today.  Concerns regarding medicines are outlined above.  Orders Placed This Encounter  Procedures  . EKG 12-Lead  . ECHOCARDIOGRAM COMPLETE   No orders of the defined types were placed in this encounter.   Patient Instructions  Medication Instructions:  The current medical regimen is effective;  continue present plan and medications.  *If you need a refill on your cardiac medications before your next appointment, please call your pharmacy*   Testing/Procedures: Echocardiogram - Your physician has requested that you have an echocardiogram. Echocardiography is a painless test that uses sound waves to create images of your heart. It provides your doctor with information about the size and shape of your heart and how well your heart's chambers and valves are working. This procedure takes approximately one hour. There are no restrictions for this procedure. This will be performed at our [facility] location - [address].    Follow-Up: At [facility], you and your health needs are our priority.  As part of our continuing mission to provide you with exceptional heart care, we have created designated Provider Care Teams.  These Care Teams include your primary Cardiologist (physician) and Advanced Practice Providers (APPs -  Physician Assistants and Nurse Practitioners) who all work together to provide you with the care you need, when you need it.  We recommend signing up for the patient portal called "MyChart".  Sign up information is provided on this After Visit Summary.  MyChart is used to connect with patients for Virtual Visits (Telemedicine).  Patients are able to view lab/test results, encounter notes, upcoming appointments, etc.  Non-urgent messages can be sent to your provider as well.   To learn more about what you can do with MyChart, go to  [email or web address].    Your next appointment:   As needed  The format for your next appointment:   In Person  Provider:   Eleonore Chiquito, MD        Signed, Addison Naegeli. Audie Box, [place]  [address] [place], [place] [address] [phone]  11/05/2019 3:53 PM

## 2019-11-04 ENCOUNTER — Other Ambulatory Visit: Payer: Self-pay

## 2019-11-04 ENCOUNTER — Inpatient Hospital Stay (HOSPITAL_BASED_OUTPATIENT_CLINIC_OR_DEPARTMENT_OTHER): Payer: Medicaid Other | Admitting: Physician Assistant

## 2019-11-04 ENCOUNTER — Inpatient Hospital Stay: Payer: Medicaid Other

## 2019-11-04 ENCOUNTER — Telehealth: Payer: Self-pay | Admitting: Radiation Oncology

## 2019-11-04 VITALS — BP 126/75 | HR 96 | Temp 97.3°F | Resp 18 | Ht 64.0 in | Wt 192.2 lb

## 2019-11-04 DIAGNOSIS — C342 Malignant neoplasm of middle lobe, bronchus or lung: Secondary | ICD-10-CM

## 2019-11-04 DIAGNOSIS — Z5112 Encounter for antineoplastic immunotherapy: Secondary | ICD-10-CM | POA: Insufficient documentation

## 2019-11-04 LAB — CBC WITH DIFFERENTIAL (CANCER CENTER ONLY)
Abs Immature Granulocytes: 0.01 10*3/uL (ref 0.00–0.07)
Basophils Absolute: 0 10*3/uL (ref 0.0–0.1)
Basophils Relative: 1 %
Eosinophils Absolute: 0 10*3/uL (ref 0.0–0.5)
Eosinophils Relative: 1 %
HCT: 36.9 % (ref 36.0–46.0)
Hemoglobin: 12.3 g/dL (ref 12.0–15.0)
Immature Granulocytes: 0 %
Lymphocytes Relative: 15 %
Lymphs Abs: 0.6 10*3/uL — ABNORMAL LOW (ref 0.7–4.0)
MCH: 35.7 pg — ABNORMAL HIGH (ref 26.0–34.0)
MCHC: 33.3 g/dL (ref 30.0–36.0)
MCV: 107 fL — ABNORMAL HIGH (ref 80.0–100.0)
Monocytes Absolute: 0.6 10*3/uL (ref 0.1–1.0)
Monocytes Relative: 15 %
Neutro Abs: 2.7 10*3/uL (ref 1.7–7.7)
Neutrophils Relative %: 68 %
Platelet Count: 362 10*3/uL (ref 150–400)
RBC: 3.45 MIL/uL — ABNORMAL LOW (ref 3.87–5.11)
RDW: 14.9 % (ref 11.5–15.5)
WBC Count: 3.8 10*3/uL — ABNORMAL LOW (ref 4.0–10.5)
nRBC: 0 % (ref 0.0–0.2)

## 2019-11-04 LAB — CMP (CANCER CENTER ONLY)
ALT: 13 U/L (ref 0–44)
AST: 18 U/L (ref 15–41)
Albumin: 3.9 g/dL (ref 3.5–5.0)
Alkaline Phosphatase: 99 U/L (ref 38–126)
Anion gap: 11 (ref 5–15)
BUN: 15 mg/dL (ref 8–23)
CO2: 23 mmol/L (ref 22–32)
Calcium: 9.8 mg/dL (ref 8.9–10.3)
Chloride: 107 mmol/L (ref 98–111)
Creatinine: 1.17 mg/dL — ABNORMAL HIGH (ref 0.44–1.00)
GFR, Est AFR Am: 57 mL/min — ABNORMAL LOW (ref >60)
GFR, Estimated: 49 mL/min — ABNORMAL LOW (ref >60)
Glucose, Bld: 110 mg/dL — ABNORMAL HIGH (ref 70–99)
Potassium: 4.3 mmol/L (ref 3.5–5.1)
Sodium: 141 mmol/L (ref 135–145)
Total Bilirubin: 0.7 mg/dL (ref 0.3–1.2)
Total Protein: 8 g/dL (ref 6.5–8.1)

## 2019-11-04 LAB — TSH: TSH: 1.474 u[IU]/mL (ref 0.308–3.960)

## 2019-11-04 MED ORDER — SODIUM CHLORIDE 0.9 % IV SOLN
1500.0000 mg | Freq: Once | INTRAVENOUS | Status: AC
  Administered 2019-11-04: 1500 mg via INTRAVENOUS
  Filled 2019-11-04: qty 30

## 2019-11-04 MED ORDER — SODIUM CHLORIDE 0.9 % IV SOLN
Freq: Once | INTRAVENOUS | Status: AC
  Filled 2019-11-04: qty 250

## 2019-11-04 NOTE — Patient Instructions (Addendum)
[facility] Discharge Instructions for Patients Receiving Chemotherapy  Today you received the following chemotherapy agents: Imfinzi.  To help prevent nausea and vomiting after your treatment, we encourage you to take your nausea medication as directed.   If you develop nausea and vomiting that is not controlled by your nausea medication, call the clinic.   BELOW ARE SYMPTOMS THAT SHOULD BE REPORTED IMMEDIATELY:  *FEVER GREATER THAN 100.5 F  *CHILLS WITH OR WITHOUT FEVER  NAUSEA AND VOMITING THAT IS NOT CONTROLLED WITH YOUR NAUSEA MEDICATION  *UNUSUAL SHORTNESS OF BREATH  *UNUSUAL BRUISING OR BLEEDING  TENDERNESS IN MOUTH AND THROAT WITH OR WITHOUT PRESENCE OF ULCERS  *URINARY PROBLEMS  *BOWEL PROBLEMS  UNUSUAL RASH Items with * indicate a potential emergency and should be followed up as soon as possible.  Feel free to call the clinic should you have any questions or concerns. The clinic phone number is [phone].  Please show the CHEMO ALERT CARD at check-in to the Emergency Department and triage nurse.  Durvalumab injection What is this medicine? DURVALUMAB (dur VAL ue mab) is a monoclonal antibody. It is used to treat urothelial cancer and lung cancer. This medicine may be used for other purposes; ask your health care provider or pharmacist if you have questions. COMMON BRAND NAME(S): IMFINZI What should I tell my health care provider before I take this medicine? They need to know if you have any of these conditions:  diabetes  immune system problems  infection  inflammatory bowel disease  kidney disease  liver disease  lung or breathing disease  lupus  organ transplant  stomach or intestine problems  thyroid disease  an unusual or allergic reaction to durvalumab, other medicines, foods, dyes, or preservatives  pregnant or trying to get pregnant  breast-feeding How should I use this medicine? This medicine is for infusion  into a vein. It is given by a health care professional in a hospital or clinic setting. A special MedGuide will be given to you before each treatment. Be sure to read this information carefully each time. Talk to your pediatrician regarding the use of this medicine in children. Special care may be needed. Overdosage: If you think you have taken too much of this medicine contact a poison control center or emergency room at once. NOTE: This medicine is only for you. Do not share this medicine with others. What if I miss a dose? It is important not to miss your dose. Call your doctor or health care professional if you are unable to keep an appointment. What may interact with this medicine? Interactions have not been studied. This list may not describe all possible interactions. Give your health care provider a list of all the medicines, herbs, non-prescription drugs, or dietary supplements you use. Also tell them if you smoke, drink alcohol, or use illegal drugs. Some items may interact with your medicine. What should I watch for while using this medicine? This drug may make you feel generally unwell. Continue your course of treatment even though you feel ill unless your doctor tells you to stop. You may need blood work done while you are taking this medicine. Do not become pregnant while taking this medicine or for 3 months after stopping it. Women should inform their doctor if they wish to become pregnant or think they might be pregnant. There is a potential for serious side effects to an unborn child. Talk to your health care professional or pharmacist for more information. Do not breast-feed   an infant while taking this medicine or for 3 months after stopping it. What side effects may I notice from receiving this medicine? Side effects that you should report to your doctor or health care professional as soon as possible:  allergic reactions like skin rash, itching or hives, swelling of the face,  lips, or tongue  black, tarry stools  bloody or watery diarrhea  breathing problems  change in emotions or moods  change in sex drive  changes in vision  chest pain or chest tightness  chills  confusion  cough  facial flushing  fever  headache  signs and symptoms of high blood sugar such as dizziness; dry mouth; dry skin; fruity breath; nausea; stomach pain; increased hunger or thirst; increased urination  signs and symptoms of liver injury like dark yellow or brown urine; general ill feeling or flu-like symptoms; light-colored stools; loss of appetite; nausea; right upper belly pain; unusually weak or tired; yellowing of the eyes or skin  stomach pain  trouble passing urine or change in the amount of urine  weight gain or weight loss Side effects that usually do not require medical attention (report these to your doctor or health care professional if they continue or are bothersome):  bone pain  constipation  loss of appetite  muscle pain  nausea  swelling of the ankles, feet, hands  tiredness This list may not describe all possible side effects. Call your doctor for medical advice about side effects. You may report side effects to FDA at 1-800-FDA-1088. Where should I keep my medicine? This drug is given in a hospital or clinic and will not be stored at home. NOTE: This sheet is a summary. It may not cover all possible information. If you have questions about this medicine, talk to your doctor, pharmacist, or health care provider.  2020 Elsevier/Gold Standard (2016-07-05 19:25:04)    

## 2019-11-04 NOTE — Telephone Encounter (Signed)
  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  Interval Since Last Radiation: 9 weeks   07/25/19 - 09/11/19: The patient was treated to the disease within the right lung initially to a dose of 60 Gy using a 4 field, 3-D conformal technique. The patient then received a cone down boost treatment for an additional 6 Gy. This yielded a final total dose of 66 Gy.   Narrative:  The patient was contacted today for routine follow-up. During treatment she did very well with radiotherapy and did not have significant desquamation. She reports she is doing well and trying to keep hydrated. She reports she's eating well and drinking well without pain or other concerns since completing treatment.  Impression/Plan: 1. 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 The patient has been doing well since completion of radiotherapy. We discussed that we would be happy to continue to follow her as needed, but she will also continue to follow up with Dr. Julien Nordmann in medical oncology.     Carola Rhine, PAC

## 2019-11-05 ENCOUNTER — Encounter: Payer: Self-pay | Admitting: Cardiovascular Disease

## 2019-11-05 ENCOUNTER — Ambulatory Visit (INDEPENDENT_AMBULATORY_CARE_PROVIDER_SITE_OTHER): Payer: Medicaid Other | Admitting: Cardiovascular Disease

## 2019-11-05 ENCOUNTER — Telehealth: Payer: Self-pay | Admitting: *Deleted

## 2019-11-05 VITALS — BP 140/80 | HR 106 | Ht 64.0 in | Wt 191.4 lb

## 2019-11-05 DIAGNOSIS — R000 Tachycardia, unspecified: Secondary | ICD-10-CM

## 2019-11-05 NOTE — Patient Instructions (Signed)
Medication Instructions:  The current medical regimen is effective;  continue present plan and medications.  *If you need a refill on your cardiac medications before your next appointment, please call your pharmacy*   Testing/Procedures: Echocardiogram - Your physician has requested that you have an echocardiogram. Echocardiography is a painless test that uses sound waves to create images of your heart. It provides your doctor with information about the size and shape of your heart and how well your heart's chambers and valves are working. This procedure takes approximately one hour. There are no restrictions for this procedure. This will be performed at our [facility] location - [address], [address].    Follow-Up: At [facility], you and your health needs are our priority.  As part of our continuing mission to provide you with exceptional heart care, we have created designated Provider Care Teams.  These Care Teams include your primary Cardiologist (physician) and Advanced Practice Providers (APPs -  Physician Assistants and Nurse Practitioners) who all work together to provide you with the care you need, when you need it.  We recommend signing up for the patient portal called "MyChart".  Sign up information is provided on this After Visit Summary.  MyChart is used to connect with patients for Virtual Visits (Telemedicine).  Patients are able to view lab/test results, encounter notes, upcoming appointments, etc.  Non-urgent messages can be sent to your provider as well.   To learn more about what you can do with MyChart, go to [email or web address].    Your next appointment:   As needed  The format for your next appointment:   In Person  Provider:   Eleonore Chiquito, MD

## 2019-11-08 ENCOUNTER — Telehealth: Payer: Self-pay | Admitting: Internal Medicine

## 2019-11-08 NOTE — Telephone Encounter (Signed)
Scheduled appt per 7/1 sch message - pt is aware of change and reminder letter also mailed.

## 2019-11-14 ENCOUNTER — Other Ambulatory Visit: Payer: Medicaid Other

## 2019-11-14 ENCOUNTER — Inpatient Hospital Stay: Payer: Medicaid Other

## 2019-11-14 ENCOUNTER — Ambulatory Visit: Payer: Medicaid Other | Admitting: Internal Medicine

## 2019-11-25 ENCOUNTER — Other Ambulatory Visit: Payer: Self-pay | Admitting: *Deleted

## 2019-11-25 DIAGNOSIS — C3491 Malignant neoplasm of unspecified part of right bronchus or lung: Secondary | ICD-10-CM

## 2019-11-26 ENCOUNTER — Other Ambulatory Visit (HOSPITAL_COMMUNITY): Payer: Medicaid Other

## 2019-11-29 NOTE — Progress Notes (Signed)
[facility] OFFICE PROGRESS NOTE  Antony Blackbird, MD [place] [address]  DIAGNOSIS: Stage IIIb (T3, M2, M0) non-small cell lung cancer, adenocarcinoma presented with right hilar mass with occlusion of the right middle lobe bronchus centrally with right middle lobe collapse diagnosed in January 2021.   PRIOR THERAPY: Concurrent chemoradiation with weekly carboplatin for AUC of 2 and paclitaxel 45 mg/M2. First dose expected on July 09, 2019. Status post 8 cycles. Last dose was given Sep 09, 2019.  CURRENT THERAPY: Consolidation treatment with immunotherapy with Imfinzi 1500 mg IV every 4 weeks. First dose 11/04/2019. Status post 1 cycle.   INTERVAL HISTORY: Janice Adams 64 y.o. female returns to the clinic for a follow up visit. The patient is feeling well today without any concerning complaints. She is being followed by cardiology for asymptomatic sinus tachycardia. She is scheduled for an echocardiogram next week. The patient tolerated her first treatment with Imfinzi well without any adverse side effects except she does have a mild pruritic rash on her right elbow. Denies any fever, chills, or weight loss. She had some night sweats last night. Denies any cough or hemoptysis. She occasionally feels very mild sternal/epigastric chest discomfort that lasts a few seconds/minutes before resolving spontaneously. She rates this discomfort a 1/10. She denies associated shortness of breath, light headedness, palpitations, or n/v. This is none exertional and not associated with any particular activities. She reports that she has loose stool first thing in the morning which resolves after taking her reflux medication. She denies any blood in the stool. She sometimes has mild nausea which she associates with her reflux. She denies diarrhea or constipation.  Denies any headache or visual changes. The patient is here today for evaluation prior to starting cycle # 2     MEDICAL HISTORY: Past Medical History:  Diagnosis Date  . Back pain   . GERD (gastroesophageal reflux disease)   . Gout    no current problems per patient 05/31/19  . History of epigastric pain    comes and goes  . Hypertension    no meds    ALLERGIES:  has No Known Allergies.  MEDICATIONS:  Current Outpatient Medications  Medication Sig Dispense Refill  . naproxen sodium (ALEVE) 220 MG tablet Take 440 mg by mouth 2 (two) times daily as needed (back pain.).    Marland Kitchen omeprazole (PRILOSEC) 20 MG capsule Take 1 capsule (20 mg total) by mouth 2 (two) times daily before a meal. To lower stomach acid 60 capsule 5  . prochlorperazine (COMPAZINE) 10 MG tablet Take 1 tablet (10 mg total) by mouth every 6 (six) hours as needed for nausea or vomiting. 30 tablet 1   No current facility-administered medications for this visit.   Facility-Administered Medications Ordered in Other Visits  Medication Dose Route Frequency Provider Last Rate Last Admin  . 0.9 %  sodium chloride infusion   Intravenous Once Curt Bears, MD      . durvalumab (IMFINZI) 1,500 mg in sodium chloride 0.9 % 100 mL chemo infusion  1,500 mg Intravenous Once Curt Bears, MD        SURGICAL HISTORY:  Past Surgical History:  Procedure Laterality Date  . MULTIPLE TOOTH EXTRACTIONS    . TONSILLECTOMY    . uterine ablation    . VIDEO BRONCHOSCOPY WITH ENDOBRONCHIAL ULTRASOUND N/A 06/04/2019   Procedure: VIDEO BRONCHOSCOPY WITH ENDOBRONCHIAL ULTRASOUND;  Surgeon: Margaretha Seeds, MD;  Location: [facility];  Service: Thoracic;  Laterality: N/A;  .  WISDOM TOOTH EXTRACTION      REVIEW OF SYSTEMS:   Review of Systems  Constitutional: Positive for fatigue. Negative for appetite change, chills, fever and unexpected weight change.  HENT: Negative for mouth sores, nosebleeds, sore throat and trouble swallowing.   Eyes: Negative for eye problems and icterus.  Respiratory: Negative for cough, hemoptysis, shortness of breath and  wheezing.   Cardiovascular: Positive for mild non-exertional sternal/epigastric discomfort (none at this time). Negative for leg swelling.  Gastrointestinal: Positive for loose stool in A.M.s and mild nausea. Negative for abdominal pain, constipation, and vomiting.  Genitourinary: Negative for bladder incontinence, difficulty urinating, dysuria, frequency and hematuria.   Musculoskeletal: Negative for back pain, gait problem, neck pain and neck stiffness.  Skin: Positive for pruritic rash on right elbow.  Neurological: Negative for dizziness, extremity weakness, gait problem, headaches, light-headedness and seizures.  Hematological: Negative for adenopathy. Does not bruise/bleed easily.  Psychiatric/Behavioral: Negative for confusion, depression and sleep disturbance. The patient is not nervous/anxious.     PHYSICAL EXAMINATION:  Blood pressure (!) 143/93, pulse (!) 120, temperature 97.9 F (36.6 C), temperature source Temporal, resp. rate 20, height 5\' 4"  (1.626 m), weight 190 lb 9.6 oz (86.5 kg), SpO2 96 %.  ECOG PERFORMANCE STATUS: 1 - Symptomatic but completely ambulatory  Physical Exam  Constitutional: Oriented to person, place, and time and well-developed, well-nourished, and in no distress.  HENT:  Head: Normocephalic and atraumatic.  Mouth/Throat: Oropharynx is clear and moist. No oropharyngeal exudate.  Eyes: Conjunctivae are normal. Right eye exhibits no discharge. Left eye exhibits no discharge. No scleral icterus.  Neck: Normal range of motion. Neck supple.  Cardiovascular: tachycardic, regular rhythm, normal heart sounds and intact distal pulses.   Pulmonary/Chest: Effort normal and breath sounds normal. No respiratory distress. No wheezes. No rales.  Abdominal: Soft. Bowel sounds are normal. Exhibits no distension and no mass. There is no tenderness.  Musculoskeletal: Normal range of motion. Exhibits no edema.  Lymphadenopathy:    No cervical adenopathy.  Neurological:  Alert and oriented to person, place, and time. Exhibits normal muscle tone. Gait normal. Coordination normal.  Skin: Small patch of a rash on her right elbow. Skin is warm and dry. Not diaphoretic. No erythema. No pallor.  Psychiatric: Mood, memory and judgment normal.  Vitals reviewed.  LABORATORY DATA: Lab Results  Component Value Date   WBC 3.5 (L) 12/02/2019   HGB 12.9 12/02/2019   HCT 38.9 12/02/2019   MCV 101.6 (H) 12/02/2019   PLT 248 12/02/2019      Chemistry      Component Value Date/Time   NA 138 12/02/2019 0948   NA 146 (H) 03/06/2018 1128   K 4.3 12/02/2019 0948   CL 105 12/02/2019 0948   CO2 23 12/02/2019 0948   BUN 22 12/02/2019 0948   BUN 8 03/06/2018 1128   CREATININE 0.99 12/02/2019 0948   CREATININE 0.85 05/11/2016 1157      Component Value Date/Time   CALCIUM 11.0 (H) 12/02/2019 0948   ALKPHOS 110 12/02/2019 0948   AST 17 12/02/2019 0948   ALT 15 12/02/2019 0948   BILITOT 0.9 12/02/2019 0948       RADIOGRAPHIC STUDIES:  No results found.   ASSESSMENT/PLAN:  This is a very pleasant 64 year old African-American female diagnosed with stage IIIb (T3, N2, M0) non-small cell lung cancer, adenocarcinoma presented with right hilar mass with occlusion of the right middle lobe bronchus as well as right middle lobe collapse in addition to right  lower lobe pulmonary nodule diagnosed in January 2021.  She completed a course of concurrent chemoradiation with weekly carboplatin and paclitaxel status post 8 cycles.  She tolerated her treatment well except for fatigue.  The patient is scheduled to being consolidation immunotherapy with Imfinzi 1500 mg IV every 4 weeks. She is status post 1 cycle and tolerated it well.  It was believed that she was not a surgical candidate. It appears that she does have a consultation with Dr. Roxan Hockey, cardiothoracic surgeon next week.  Labs were reviewed.  Recommend that she proceed with cycle #2 today  scheduled.  Labs/vitals were reviewed. Discussed her symptoms with Dr. Julien Nordmann. Recommend that she proceed with cycle #2 as scheduled.. She will continue to see cardiology for her asymptomatic tachycardia. She is scheduled to have an echocardiogram next week. I have printed her appointments for her and instructed her to please arrive early to her appointments since she missed prior scheduled echocardiogram.   We will see her back for follow-up visit in 4 weeks for evaluation before starting cycle #3.  She was advised to use hydrocortisone cream for now on her rash.   The patient was advised to call immediately if she has any concerning symptoms in the interval. The patient voices understanding of current disease status and treatment options and is in agreement with the current care plan. All questions were answered. The patient knows to call the clinic with any problems, questions or concerns. We can certainly see the patient much sooner if necessary      No orders of the defined types were placed in this encounter.    Evalynn Hankins L Jonluke Cobbins, PA-C 12/02/19

## 2019-12-02 ENCOUNTER — Inpatient Hospital Stay (HOSPITAL_BASED_OUTPATIENT_CLINIC_OR_DEPARTMENT_OTHER): Payer: Medicaid Other | Admitting: Physician Assistant

## 2019-12-02 ENCOUNTER — Inpatient Hospital Stay: Payer: Medicaid Other | Attending: Internal Medicine

## 2019-12-02 ENCOUNTER — Inpatient Hospital Stay: Payer: Medicaid Other

## 2019-12-02 ENCOUNTER — Other Ambulatory Visit: Payer: Self-pay

## 2019-12-02 ENCOUNTER — Encounter: Payer: Self-pay | Admitting: Internal Medicine

## 2019-12-02 VITALS — BP 143/93 | HR 120 | Temp 97.9°F | Resp 20 | Ht 64.0 in | Wt 190.6 lb

## 2019-12-02 DIAGNOSIS — Z5112 Encounter for antineoplastic immunotherapy: Secondary | ICD-10-CM

## 2019-12-02 DIAGNOSIS — C342 Malignant neoplasm of middle lobe, bronchus or lung: Secondary | ICD-10-CM

## 2019-12-02 DIAGNOSIS — Z79899 Other long term (current) drug therapy: Secondary | ICD-10-CM | POA: Diagnosis not present

## 2019-12-02 LAB — CBC WITH DIFFERENTIAL (CANCER CENTER ONLY)
Abs Immature Granulocytes: 0.01 10*3/uL (ref 0.00–0.07)
Basophils Absolute: 0 10*3/uL (ref 0.0–0.1)
Basophils Relative: 1 %
Eosinophils Absolute: 0.1 10*3/uL (ref 0.0–0.5)
Eosinophils Relative: 1 %
HCT: 38.9 % (ref 36.0–46.0)
Hemoglobin: 12.9 g/dL (ref 12.0–15.0)
Immature Granulocytes: 0 %
Lymphocytes Relative: 21 %
Lymphs Abs: 0.7 10*3/uL (ref 0.7–4.0)
MCH: 33.7 pg (ref 26.0–34.0)
MCHC: 33.2 g/dL (ref 30.0–36.0)
MCV: 101.6 fL — ABNORMAL HIGH (ref 80.0–100.0)
Monocytes Absolute: 0.6 10*3/uL (ref 0.1–1.0)
Monocytes Relative: 17 %
Neutro Abs: 2.1 10*3/uL (ref 1.7–7.7)
Neutrophils Relative %: 60 %
Platelet Count: 248 10*3/uL (ref 150–400)
RBC: 3.83 MIL/uL — ABNORMAL LOW (ref 3.87–5.11)
RDW: 12 % (ref 11.5–15.5)
WBC Count: 3.5 10*3/uL — ABNORMAL LOW (ref 4.0–10.5)
nRBC: 0 % (ref 0.0–0.2)

## 2019-12-02 LAB — CMP (CANCER CENTER ONLY)
ALT: 15 U/L (ref 0–44)
AST: 17 U/L (ref 15–41)
Albumin: 3.9 g/dL (ref 3.5–5.0)
Alkaline Phosphatase: 110 U/L (ref 38–126)
Anion gap: 10 (ref 5–15)
BUN: 22 mg/dL (ref 8–23)
CO2: 23 mmol/L (ref 22–32)
Calcium: 11 mg/dL — ABNORMAL HIGH (ref 8.9–10.3)
Chloride: 105 mmol/L (ref 98–111)
Creatinine: 0.99 mg/dL (ref 0.44–1.00)
GFR, Est AFR Am: >60 mL/min (ref >60)
GFR, Estimated: >60 mL/min (ref >60)
Glucose, Bld: 110 mg/dL — ABNORMAL HIGH (ref 70–99)
Potassium: 4.3 mmol/L (ref 3.5–5.1)
Sodium: 138 mmol/L (ref 135–145)
Total Bilirubin: 0.9 mg/dL (ref 0.3–1.2)
Total Protein: 8.2 g/dL — ABNORMAL HIGH (ref 6.5–8.1)

## 2019-12-02 LAB — TSH: TSH: <0.08 u[IU]/mL — ABNORMAL LOW (ref 0.308–3.960)

## 2019-12-02 MED ORDER — SODIUM CHLORIDE 0.9 % IV SOLN
Freq: Once | INTRAVENOUS | Status: AC
  Filled 2019-12-02: qty 250

## 2019-12-02 MED ORDER — SODIUM CHLORIDE 0.9 % IV SOLN
1500.0000 mg | Freq: Once | INTRAVENOUS | Status: AC
  Administered 2019-12-02: 1500 mg via INTRAVENOUS
  Filled 2019-12-02: qty 30

## 2019-12-02 NOTE — Progress Notes (Signed)
Received message from registration staff this morning patient's niece was inquiring about the grant for her. Gave my card for patient to call.  Placed call to patient for follow up on concern. Patient states she is ok and has no needs at this time. Advised patient she may still apply and she has my card if interested and for any additional financial questions or concerns. She verbalized understanding. I asked her if she would also let her niece know that I did follow up with her and she states she would.

## 2019-12-02 NOTE — Patient Instructions (Signed)
[facility] Discharge Instructions for Patients Receiving Chemotherapy  Today you received the following chemotherapy agents: Imfinzi.  To help prevent nausea and vomiting after your treatment, we encourage you to take your nausea medication as directed.   If you develop nausea and vomiting that is not controlled by your nausea medication, call the clinic.   BELOW ARE SYMPTOMS THAT SHOULD BE REPORTED IMMEDIATELY:  *FEVER GREATER THAN 100.5 F  *CHILLS WITH OR WITHOUT FEVER  NAUSEA AND VOMITING THAT IS NOT CONTROLLED WITH YOUR NAUSEA MEDICATION  *UNUSUAL SHORTNESS OF BREATH  *UNUSUAL BRUISING OR BLEEDING  TENDERNESS IN MOUTH AND THROAT WITH OR WITHOUT PRESENCE OF ULCERS  *URINARY PROBLEMS  *BOWEL PROBLEMS  UNUSUAL RASH Items with * indicate a potential emergency and should be followed up as soon as possible.  Feel free to call the clinic should you have any questions or concerns. The clinic phone number is [phone].  Please show the CHEMO ALERT CARD at check-in to the Emergency Department and triage nurse.  Durvalumab injection What is this medicine? DURVALUMAB (dur VAL ue mab) is a monoclonal antibody. It is used to treat urothelial cancer and lung cancer. This medicine may be used for other purposes; ask your health care provider or pharmacist if you have questions. COMMON BRAND NAME(S): IMFINZI What should I tell my health care provider before I take this medicine? They need to know if you have any of these conditions:  diabetes  immune system problems  infection  inflammatory bowel disease  kidney disease  liver disease  lung or breathing disease  lupus  organ transplant  stomach or intestine problems  thyroid disease  an unusual or allergic reaction to durvalumab, other medicines, foods, dyes, or preservatives  pregnant or trying to get pregnant  breast-feeding How should I use this medicine? This medicine is for infusion  into a vein. It is given by a health care professional in a hospital or clinic setting. A special MedGuide will be given to you before each treatment. Be sure to read this information carefully each time. Talk to your pediatrician regarding the use of this medicine in children. Special care may be needed. Overdosage: If you think you have taken too much of this medicine contact a poison control center or emergency room at once. NOTE: This medicine is only for you. Do not share this medicine with others. What if I miss a dose? It is important not to miss your dose. Call your doctor or health care professional if you are unable to keep an appointment. What may interact with this medicine? Interactions have not been studied. This list may not describe all possible interactions. Give your health care provider a list of all the medicines, herbs, non-prescription drugs, or dietary supplements you use. Also tell them if you smoke, drink alcohol, or use illegal drugs. Some items may interact with your medicine. What should I watch for while using this medicine? This drug may make you feel generally unwell. Continue your course of treatment even though you feel ill unless your doctor tells you to stop. You may need blood work done while you are taking this medicine. Do not become pregnant while taking this medicine or for 3 months after stopping it. Women should inform their doctor if they wish to become pregnant or think they might be pregnant. There is a potential for serious side effects to an unborn child. Talk to your health care professional or pharmacist for more information. Do not breast-feed   an infant while taking this medicine or for 3 months after stopping it. What side effects may I notice from receiving this medicine? Side effects that you should report to your doctor or health care professional as soon as possible:  allergic reactions like skin rash, itching or hives, swelling of the face,  lips, or tongue  black, tarry stools  bloody or watery diarrhea  breathing problems  change in emotions or moods  change in sex drive  changes in vision  chest pain or chest tightness  chills  confusion  cough  facial flushing  fever  headache  signs and symptoms of high blood sugar such as dizziness; dry mouth; dry skin; fruity breath; nausea; stomach pain; increased hunger or thirst; increased urination  signs and symptoms of liver injury like dark yellow or brown urine; general ill feeling or flu-like symptoms; light-colored stools; loss of appetite; nausea; right upper belly pain; unusually weak or tired; yellowing of the eyes or skin  stomach pain  trouble passing urine or change in the amount of urine  weight gain or weight loss Side effects that usually do not require medical attention (report these to your doctor or health care professional if they continue or are bothersome):  bone pain  constipation  loss of appetite  muscle pain  nausea  swelling of the ankles, feet, hands  tiredness This list may not describe all possible side effects. Call your doctor for medical advice about side effects. You may report side effects to FDA at 1-800-FDA-1088. Where should I keep my medicine? This drug is given in a hospital or clinic and will not be stored at home. NOTE: This sheet is a summary. It may not cover all possible information. If you have questions about this medicine, talk to your doctor, pharmacist, or health care provider.  2020 Elsevier/Gold Standard (2016-07-05 19:25:04)    

## 2019-12-02 NOTE — Progress Notes (Signed)
Per Cassie H, PA okay for patient to receive treatment today with HR 120.

## 2019-12-10 ENCOUNTER — Ambulatory Visit
Admission: RE | Admit: 2019-12-10 | Discharge: 2019-12-10 | Disposition: A | Discharge status: Home / Self Care | Payer: Medicaid Other | Source: Ambulatory Visit | Attending: Thoracic Surgery (Cardiothoracic Vascular Surgery) | Admitting: Thoracic Surgery (Cardiothoracic Vascular Surgery)

## 2019-12-10 ENCOUNTER — Other Ambulatory Visit: Payer: Self-pay

## 2019-12-10 ENCOUNTER — Ambulatory Visit (INDEPENDENT_AMBULATORY_CARE_PROVIDER_SITE_OTHER): Payer: Medicaid Other | Admitting: Thoracic Surgery (Cardiothoracic Vascular Surgery)

## 2019-12-10 ENCOUNTER — Encounter: Payer: Self-pay | Admitting: Thoracic Surgery (Cardiothoracic Vascular Surgery)

## 2019-12-10 VITALS — BP 120/88 | HR 100 | Temp 98.5°F | Resp 20 | Ht 64.0 in | Wt 190.0 lb

## 2019-12-10 DIAGNOSIS — R918 Other nonspecific abnormal finding of lung field: Secondary | ICD-10-CM | POA: Diagnosis not present

## 2019-12-10 DIAGNOSIS — C3491 Malignant neoplasm of unspecified part of right bronchus or lung: Secondary | ICD-10-CM | POA: Diagnosis not present

## 2019-12-10 IMAGING — CT CT CHEST W/O CM
2 of 4 series · 12 of 36 positions shown, 15 images · non-contrast
Comparison: [DATE]

CLINICAL DATA: Follow-up right small cell lung carcinoma.
Undergoing chemotherapy. Previous radiation therapy

EXAM:
CT CHEST WITHOUT CONTRAST
TECHNIQUE: Multidetector CT imaging of the chest was performed following the
standard protocol without IV contrast.

[Series 2: chest 2.00 br40 s3 · axial · 0.59mm/px · z∈[+1546,+1766]mm · 9 of 132 slices shown, 12 images (1 of 2)]
[im 11/132  mediastinal]
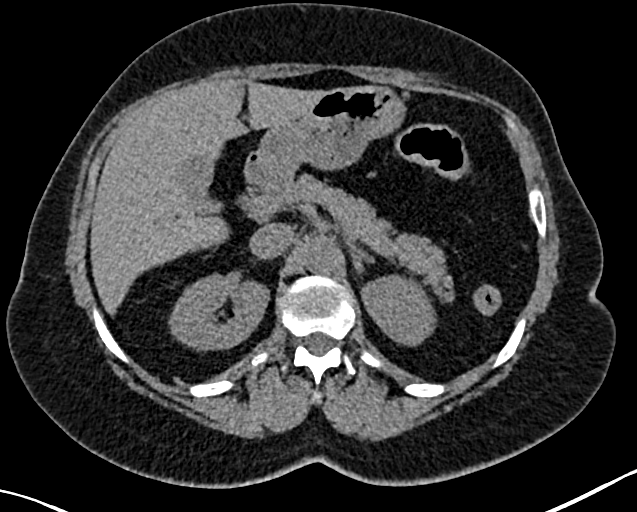
[im 11/132  lung]
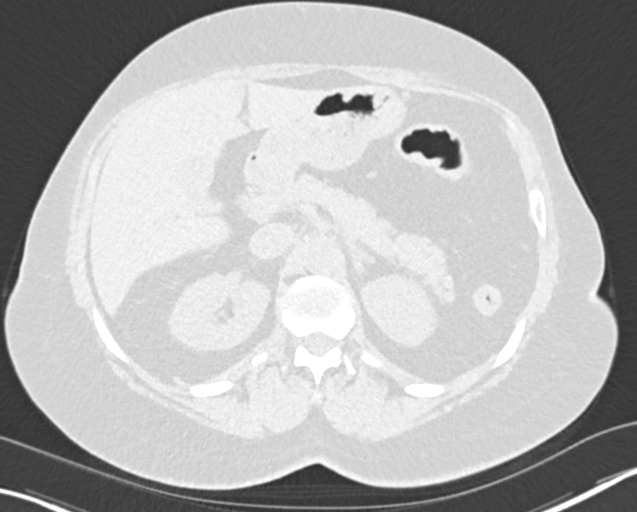
[im 31/132  lung]
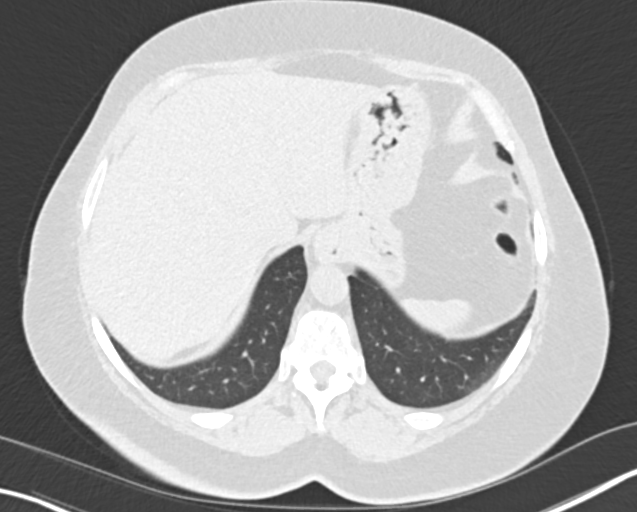
[im 41/132  lung]
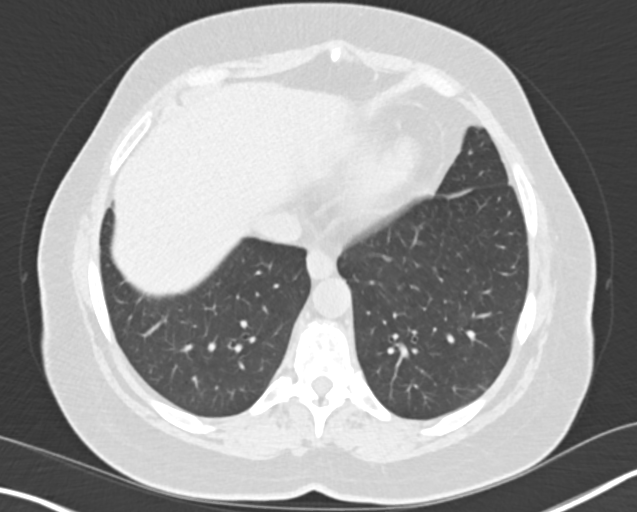
[im 51/132  lung]
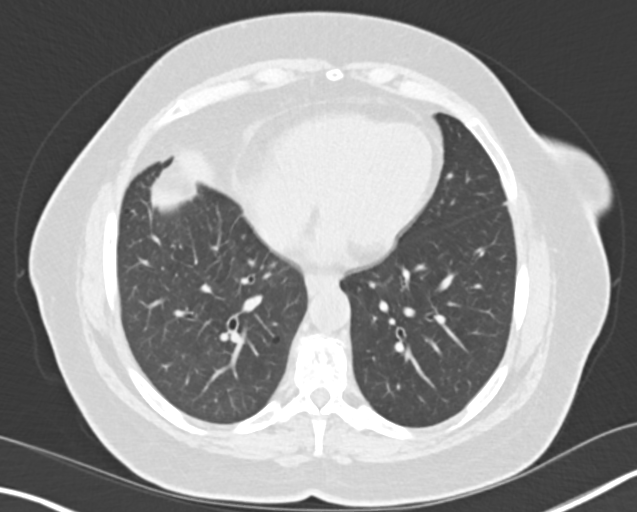
[im 71/132  mediastinal]
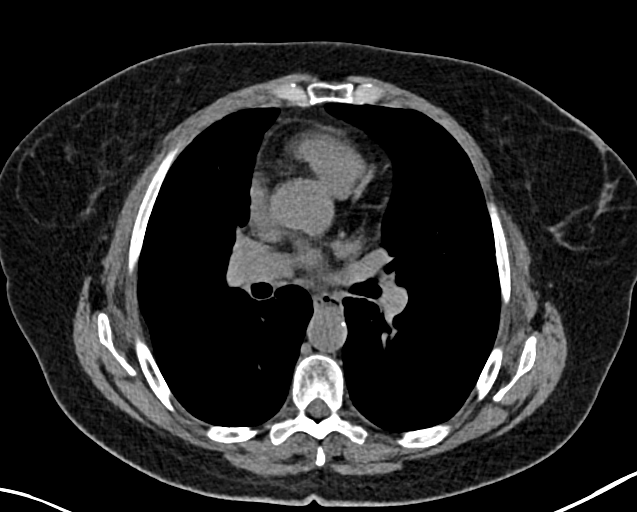
[im 71/132  lung]
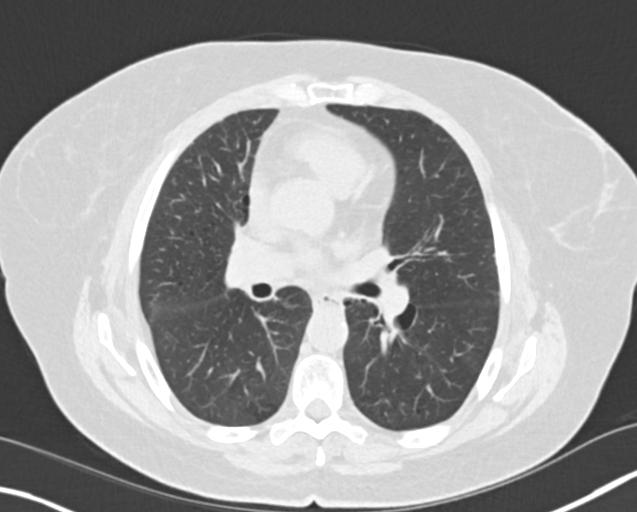
[im 81/132  lung]
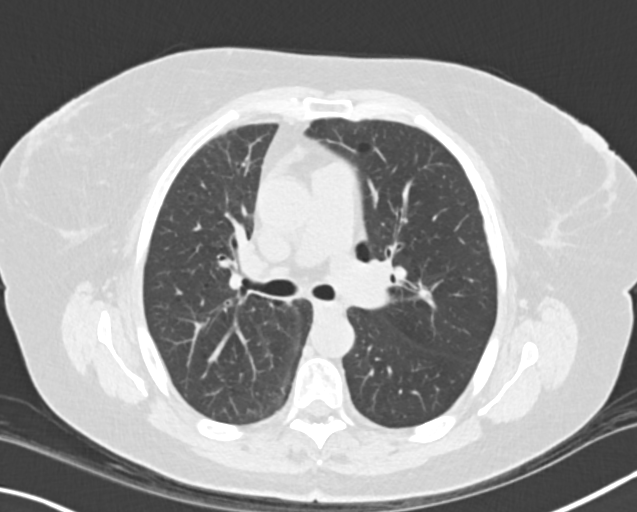
[im 91/132  lung]
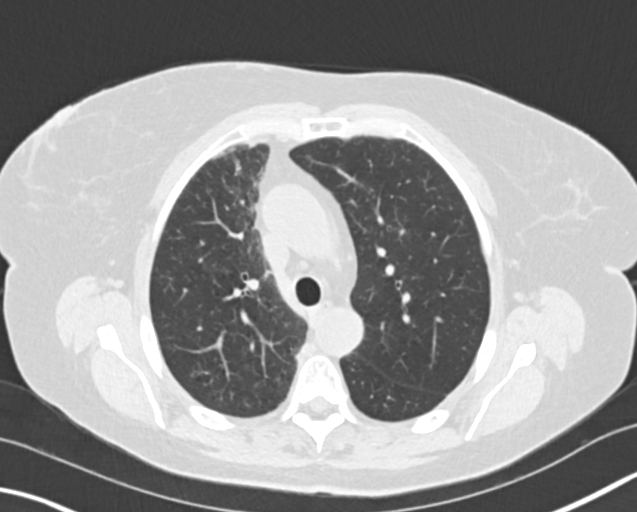
[im 111/132  lung]
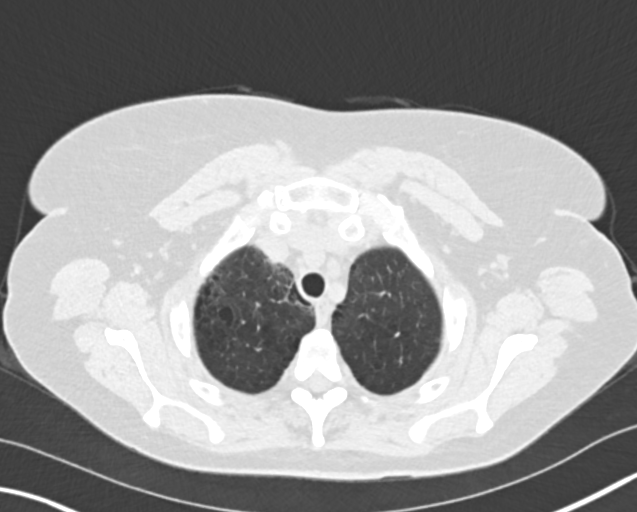
[im 121/132  mediastinal]
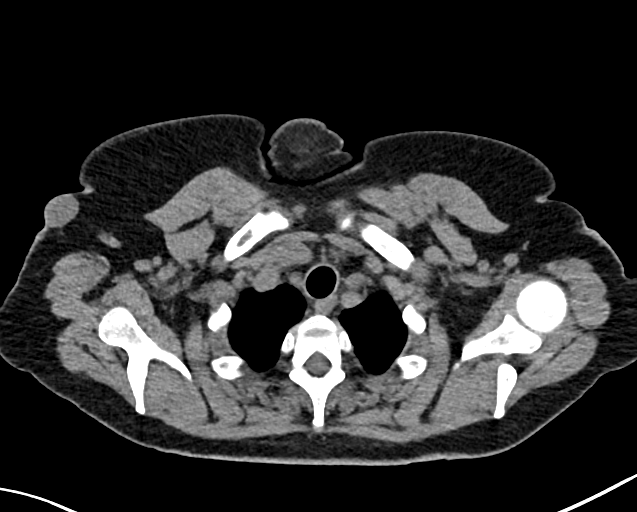
[im 121/132  lung]
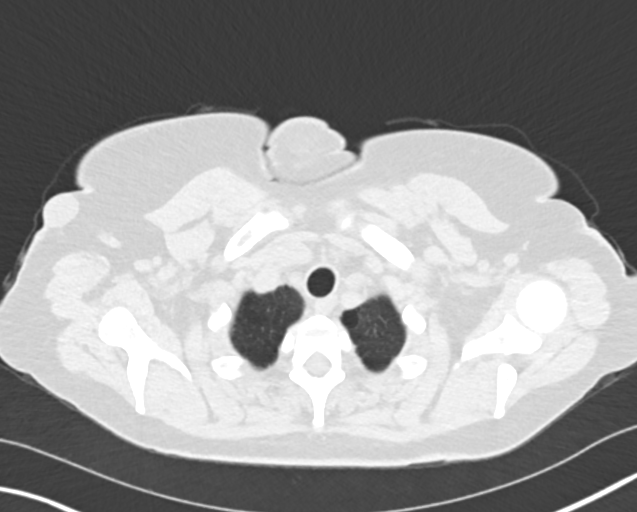

[Series 4: chest 2.00 br40 s3 · coronal · 0.52mm/px · 3 of 150 slices shown (2 of 2)]
[im 30/150  lung]
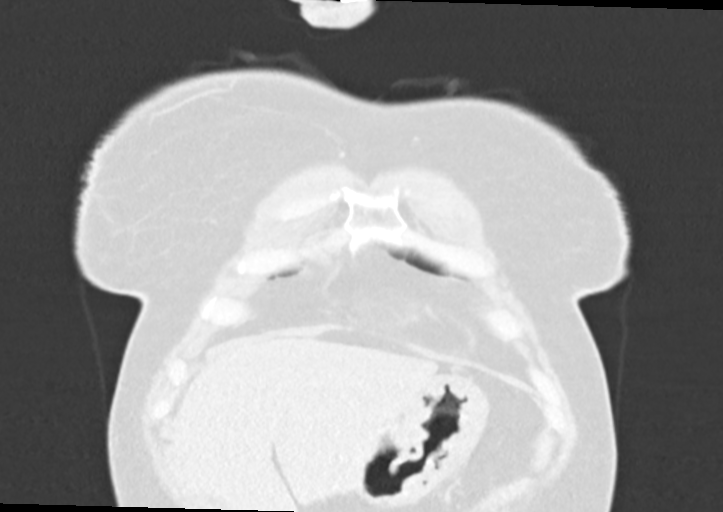
[im 60/150  lung]
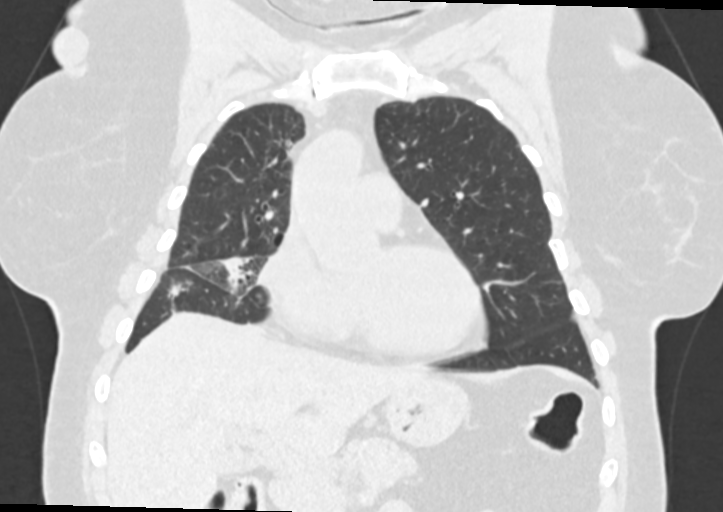
[im 90/150  lung]
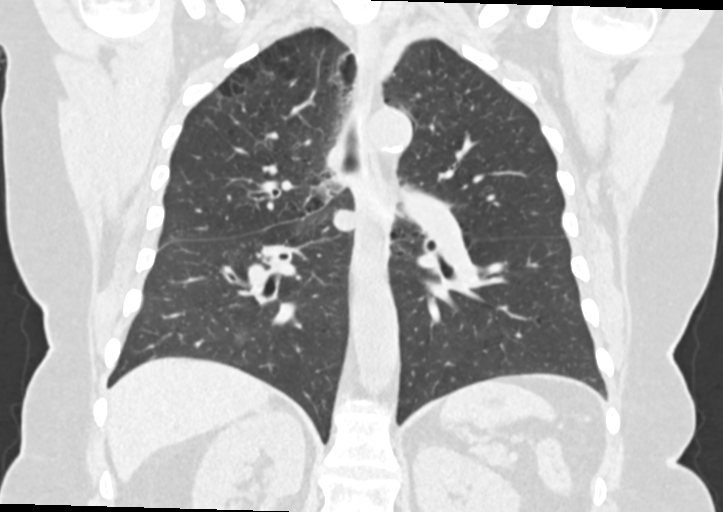

[12 of 36 positions shown; findings below may reference images not displayed]

FINDINGS: Cardiovascular:  No acute findings. Aortic atherosclerosis noted.

Mediastinum/Nodes: No masses or pathologically enlarged lymph nodes
identified on this unenhanced exam.

Lungs/Pleura: Central right middle lobe mass is again seen but more
difficult to evaluate without IV contrast. Right middle lobe
atelectasis is again seen but improved aeration is noted compared to
previous study. Central right middle lobe mass is best measured on
image 67/2 at 2.8 x 1.6 cm, decreased in size from 4.2 x 3.2 cm on
prior exam. Well circumscribed subpleural nodule in the superior
right lower lobe in the as ago esophageal recess measures 10 x 10 mm
on image 57/8, without significant change compared to prior study.
No new or enlarging pulmonary nodules or masses are identified.
Right lung paramediastinal radiation changes are again seen. Mild
emphysema again noted. No evidence of pleural effusion.

Upper Abdomen:  Unremarkable.

Musculoskeletal:  No suspicious bone lesions.
IMPRESSION: Continued mild decrease in size of central right middle lobe mass
and postobstructive atelectasis.

Stable 10 mm subpleural nodule in superior right lower lobe.

No new or progressive disease within the thorax.

Aortic Atherosclerosis ([3Q]-[3Q]) and Emphysema ([3Q]-[3Q]).

## 2019-12-10 NOTE — H&P (View-Only) (Signed)
[address]       [place] [address]             [phone]      HPI: Janice Adams returns to discuss possible surgical resection.  Janice Adams is a 64 year old woman with a history of tobacco abuse, hypertension, reflux, and back pain.  She presented in November with shortness of breath, cough, and chest tightness.  A chest x-ray showed a right infrahilar mass.  On CT of the chest she had a 5.3 x 3.6 x 3.2 cm infrahilar mass with extension along the bronchus intermedius.  On PET CT there was some hypermetabolism in the level 4R and 7 lymph nodes.  The mass was markedly hypermetabolic.  Bronchoscopy and biopsy showed adenocarcinoma.  Lymph node aspirations were negative.  She was treated with chemo and radiation which she completed in June and now returns to discuss possible resection.  She says that she was feeling fine all through chemo and radiation.  She really did not have any problems with nausea throughout that.  However after she received an infusion last week, which I think was Imfinzi she has felt poorly.  Today she is complaining of abdominal pain and she had some nausea and vomiting earlier.  He says she is also been having rapid heart rate and is scheduled to have an echocardiogram tomorrow.  Past Medical History:  Diagnosis Date  . Back pain   . GERD (gastroesophageal reflux disease)   . Gout    no current problems per patient 05/31/19  . History of epigastric pain    comes and goes  . Hypertension    no meds   Past Surgical History:  Procedure Laterality Date  . MULTIPLE TOOTH EXTRACTIONS    . TONSILLECTOMY    . uterine ablation    . VIDEO BRONCHOSCOPY WITH ENDOBRONCHIAL ULTRASOUND N/A 06/04/2019   Procedure: VIDEO BRONCHOSCOPY WITH ENDOBRONCHIAL ULTRASOUND;  Surgeon: Margaretha Seeds, MD;  Location: [facility] OR;  Service: Thoracic;  Laterality: N/A;  . WISDOM TOOTH EXTRACTION      Current Outpatient Medications  Medication Sig Dispense Refill   . naproxen sodium (ALEVE) 220 MG tablet Take 440 mg by mouth 2 (two) times daily as needed (back pain.).    Marland Kitchen omeprazole (PRILOSEC) 20 MG capsule Take 1 capsule (20 mg total) by mouth 2 (two) times daily before a meal. To lower stomach acid 60 capsule 5  . prochlorperazine (COMPAZINE) 10 MG tablet Take 1 tablet (10 mg total) by mouth every 6 (six) hours as needed for nausea or vomiting. 30 tablet 1   No current facility-administered medications for this visit.    Physical Exam BP 120/88   Pulse 100   Temp 98.5 F (36.9 C) (Oral)   Resp 20   Ht 5\' 4"  (1.626 m)   Wt 190 lb (86.2 kg)   LMP  (LMP Unknown)   SpO2 95% Comment: RA  BMI 32.31 kg/m  64 year old woman in no acute distress Alert and oriented x3 with no focal deficits No cervical or supraclavicular adenopathy Cardiac tachycardic, regular Lungs clear bilaterally Abdomen soft, mild tenderness, no rebound Extremities are without clubbing cyanosis or edema  Diagnostic Tests: Pulmonary function testing 07/01/2019 FVC 2.33 (91%) FEV1 1.90 (95%) DLCO 13.80 (69%)  CT CHEST WITHOUT CONTRAST  TECHNIQUE: Multidetector CT imaging of the chest was performed following the standard protocol without IV contrast.  COMPARISON:  10/04/2019  FINDINGS: Cardiovascular:  No acute findings. Aortic  atherosclerosis noted.  Mediastinum/Nodes: No masses or pathologically enlarged lymph nodes identified on this unenhanced exam.  Lungs/Pleura: Central right middle lobe mass is again seen but more difficult to evaluate without IV contrast. Right middle lobe atelectasis is again seen but improved aeration is noted compared to previous study. Central right middle lobe mass is best measured on image 67/2 at 2.8 x 1.6 cm, decreased in size from 4.2 x 3.2 cm on prior exam. Well circumscribed subpleural nodule in the superior right lower lobe in the as ago esophageal recess measures 10 x 10 mm on image 57/8, without significant change  compared to prior study. No new or enlarging pulmonary nodules or masses are identified. Right lung paramediastinal radiation changes are again seen. Mild emphysema again noted. No evidence of pleural effusion.  Upper Abdomen:  Unremarkable.  Musculoskeletal:  No suspicious bone lesions.  IMPRESSION: Continued mild decrease in size of central right middle lobe mass and postobstructive atelectasis.  Stable 10 mm subpleural nodule in superior right lower lobe.  No new or progressive disease within the thorax.  Aortic Atherosclerosis (ICD10-I70.0) and Emphysema (ICD10-J43.9).   Electronically Signed   By: Marlaine Hind M.D.   On: 12/10/2019 14:28 I personally reviewed the CT images and concur with the findings noted above  Impression: Janice Adams is a 64 year old woman with a history of tobacco abuse, hypertension, reflux, and back pain.  She originally presented 9 months ago with some shortness of breath and chest pain.  Those resolved but she was found to have a right hilar mass.  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  She was treated with neoadjuvant chemoradiation.  She received 8 cycles of carboplatin and paclitaxel completing treatment on May 3.  She completed a dose of 66 Gy on 09/11/2019.  A follow-up CT of the chest on 5/28 showed interval decrease in size of the right middle lobe tumor.  Not clear what that happened but she was referred to my office until recently.  In the meantime she has been started on Imfinzi.  She complains of some significant abdominal discomfort and nausea today she vomited one time.  Not sure what that could be related but she relates feeling poorly to it when she got the infusion.  She will check with Dr. Worthy Flank office to see if that is a known side effect.  She has been noted to be tachycardic on several visits and is scheduled to have an echocardiogram tomorrow.  We certainly need to know the results of that  before we can make any definitive plans to move forward with surgical resection.  I discussed the possibility of surgical resection with Janice Adams.  She understands that she has had a good response with chemo and radiation and that alone may be curative, but more commonly the tumor will recur.  Surgical resection is not a guarantee of a cure but would improve her chances of survival.  She does understand that this is relatively late after the completion of treatment for surgery which may make the surgery more difficult but does not eliminate the possibility of beneficial effect.  I informed her of the general nature of the procedure including the need for general anesthesia, the incision to be used, the possible need for thoracotomy, the amount of lung to be resected (probable bilobectomy), the use of drains to use postoperatively, the expected hospital stay, and the overall recovery.  I informed her of the indications, risks, benefits, and  alternatives.  She understands the risk include, but not limited to death, MI, DVT, PE, bleeding, possible need for transfusion, infection, prolonged air leak, cardiac arrhythmias, as well as possibility of other unforeseeable complications.  She does have adequate pulmonary function to tolerate a bilobectomy as needed.   Plan: For echocardiogram tomorrow Tentatively plan for bronchoscopy, robotic right middle or bilobectomy, possible thoracotomy on Monday, 12/30/2019 pending resolution of any abdominal or cardiac issues  Melrose Nakayama, MD [facility] [phone]

## 2019-12-10 NOTE — Progress Notes (Signed)
[address]       [place] [address]             [phone]      HPI: Janice Adams returns to discuss possible surgical resection.  Janice Adams is a 64 year old woman with a history of tobacco abuse, hypertension, reflux, and back pain.  She presented in November with shortness of breath, cough, and chest tightness.  A chest x-ray showed a right infrahilar mass.  On CT of the chest she had a 5.3 x 3.6 x 3.2 cm infrahilar mass with extension along the bronchus intermedius.  On PET CT there was some hypermetabolism in the level 4R and 7 lymph nodes.  The mass was markedly hypermetabolic.  Bronchoscopy and biopsy showed adenocarcinoma.  Lymph node aspirations were negative.  She was treated with chemo and radiation which she completed in June and now returns to discuss possible resection.  She says that she was feeling fine all through chemo and radiation.  She really did not have any problems with nausea throughout that.  However after she received an infusion last week, which I think was Imfinzi she has felt poorly.  Today she is complaining of abdominal pain and she had some nausea and vomiting earlier.  He says she is also been having rapid heart rate and is scheduled to have an echocardiogram tomorrow.  Past Medical History:  Diagnosis Date  . Back pain   . GERD (gastroesophageal reflux disease)   . Gout    no current problems per patient 05/31/19  . History of epigastric pain    comes and goes  . Hypertension    no meds   Past Surgical History:  Procedure Laterality Date  . MULTIPLE TOOTH EXTRACTIONS    . TONSILLECTOMY    . uterine ablation    . VIDEO BRONCHOSCOPY WITH ENDOBRONCHIAL ULTRASOUND N/A 06/04/2019   Procedure: VIDEO BRONCHOSCOPY WITH ENDOBRONCHIAL ULTRASOUND;  Surgeon: Janice Seeds, MD;  Location: [facility] OR;  Service: Thoracic;  Laterality: N/A;  . WISDOM TOOTH EXTRACTION      Current Outpatient Medications  Medication Sig Dispense Refill   . naproxen sodium (ALEVE) 220 MG tablet Take 440 mg by mouth 2 (two) times daily as needed (back pain.).    Marland Kitchen omeprazole (PRILOSEC) 20 MG capsule Take 1 capsule (20 mg total) by mouth 2 (two) times daily before a meal. To lower stomach acid 60 capsule 5  . prochlorperazine (COMPAZINE) 10 MG tablet Take 1 tablet (10 mg total) by mouth every 6 (six) hours as needed for nausea or vomiting. 30 tablet 1   No current facility-administered medications for this visit.    Physical Exam BP 120/88   Pulse 100   Temp 98.5 F (36.9 C) (Oral)   Resp 20   Ht 5\' 4"  (1.626 m)   Wt 190 lb (86.2 kg)   LMP  (LMP Unknown)   SpO2 95% Comment: RA  BMI 32.11 kg/m  64 year old woman in no acute distress Alert and oriented x3 with no focal deficits No cervical or supraclavicular adenopathy Cardiac tachycardic, regular Lungs clear bilaterally Abdomen soft, mild tenderness, no rebound Extremities are without clubbing cyanosis or edema  Diagnostic Tests: Pulmonary function testing 07/01/2019 FVC 2.33 (91%) FEV1 1.90 (95%) DLCO 13.80 (69%)  CT CHEST WITHOUT CONTRAST  TECHNIQUE: Multidetector CT imaging of the chest was performed following the standard protocol without IV contrast.  COMPARISON:  10/04/2019  FINDINGS: Cardiovascular:  No acute findings. Aortic  atherosclerosis noted.  Mediastinum/Nodes: No masses or pathologically enlarged lymph nodes identified on this unenhanced exam.  Lungs/Pleura: Central right middle lobe mass is again seen but more difficult to evaluate without IV contrast. Right middle lobe atelectasis is again seen but improved aeration is noted compared to previous study. Central right middle lobe mass is best measured on image 67/2 at 2.8 x 1.6 cm, decreased in size from 4.2 x 3.2 cm on prior exam. Well circumscribed subpleural nodule in the superior right lower lobe in the as ago esophageal recess measures 10 x 10 mm on image 57/8, without significant change  compared to prior study. No new or enlarging pulmonary nodules or masses are identified. Right lung paramediastinal radiation changes are again seen. Mild emphysema again noted. No evidence of pleural effusion.  Upper Abdomen:  Unremarkable.  Musculoskeletal:  No suspicious bone lesions.  IMPRESSION: Continued mild decrease in size of central right middle lobe mass and postobstructive atelectasis.  Stable 10 mm subpleural nodule in superior right lower lobe.  No new or progressive disease within the thorax.  Aortic Atherosclerosis (ICD10-I70.0) and Emphysema (ICD10-J43.9).   Electronically Signed   By: Janice Adams M.D.   On: 12/10/2019 14:28 I personally reviewed the CT images and concur with the findings noted above  Impression: Janice Adams is a 64 year old woman with a history of tobacco abuse, hypertension, reflux, and back pain.  She originally presented 9 months ago with some shortness of breath and chest pain.  Those resolved but she was found to have a right hilar mass.  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  She was treated with neoadjuvant chemoradiation.  She received 8 cycles of carboplatin and paclitaxel completing treatment on May 3.  She completed a dose of 66 Gy on 09/11/2019.  A follow-up CT of the chest on 5/28 showed interval decrease in size of the right middle lobe tumor.  Not clear what that happened but she was referred to my office until recently.  In the meantime she has been started on Imfinzi.  She complains of some significant abdominal discomfort and nausea today she vomited one time.  Not sure what that could be related but she relates feeling poorly to it when she got the infusion.  She will check with Janice Adams office to see if that is a known side effect.  She has been noted to be tachycardic on several visits and is scheduled to have an echocardiogram tomorrow.  We certainly need to know the results of that  before we can make any definitive plans to move forward with surgical resection.  I discussed the possibility of surgical resection with Janice Adams.  She understands that she has had a good response with chemo and radiation and that alone may be curative, but more commonly the tumor will recur.  Surgical resection is not a guarantee of a cure but would improve her chances of survival.  She does understand that this is relatively late after the completion of treatment for surgery which may make the surgery more difficult but does not eliminate the possibility of beneficial effect.  I informed her of the general nature of the procedure including the need for general anesthesia, the incision to be used, the possible need for thoracotomy, the amount of lung to be resected (probable bilobectomy), the use of drains to use postoperatively, the expected hospital stay, and the overall recovery.  I informed her of the indications, risks, benefits, and  alternatives.  She understands the risk include, but not limited to death, MI, DVT, PE, bleeding, possible need for transfusion, infection, prolonged air leak, cardiac arrhythmias, as well as possibility of other unforeseeable complications.  She does have adequate pulmonary function to tolerate a bilobectomy as needed.   Plan: For echocardiogram tomorrow Tentatively plan for bronchoscopy, robotic right middle or bilobectomy, possible thoracotomy on Monday, 12/30/2019 pending resolution of any abdominal or cardiac issues  Melrose Nakayama, MD [facility] [phone]

## 2019-12-11 ENCOUNTER — Ambulatory Visit: Payer: Medicaid Other | Admitting: Internal Medicine

## 2019-12-11 ENCOUNTER — Other Ambulatory Visit: Payer: Self-pay | Admitting: *Deleted

## 2019-12-11 ENCOUNTER — Encounter: Payer: Self-pay | Admitting: *Deleted

## 2019-12-11 ENCOUNTER — Other Ambulatory Visit (HOSPITAL_COMMUNITY): Payer: Medicaid Other

## 2019-12-11 ENCOUNTER — Other Ambulatory Visit: Payer: Medicaid Other

## 2019-12-11 ENCOUNTER — Ambulatory Visit: Payer: Medicaid Other

## 2019-12-11 DIAGNOSIS — C801 Malignant (primary) neoplasm, unspecified: Secondary | ICD-10-CM

## 2019-12-16 ENCOUNTER — Encounter (HOSPITAL_COMMUNITY): Payer: Self-pay | Admitting: Cardiovascular Disease

## 2019-12-25 NOTE — Progress Notes (Signed)
[facility] [place] [place] [address] Phone: [phone] Fax: [phone]      Your procedure is scheduled on August 23  Report to [facility] Main Entrance "A" at 0530 A.M., and check in at the Admitting office.  Call this number if you have problems the morning of surgery:  [phone]  Call [phone] if you have any questions prior to your surgery date Monday-Friday 8am-4pm    Remember:  Do not eat or drink after midnight the night before your surgery     Take these medicines the morning of surgery with A SIP OF WATER  omeprazole (PRILOSEC)   As of today, STOP taking any Aspirin (unless otherwise instructed by your surgeon) Aleve, Naproxen, Ibuprofen, Motrin, Advil, Goody's, BC's, all herbal medications, fish oil, and all vitamins.                      Do not wear jewelry, make up, or nail polish            Do not wear lotions, powders, perfumes, or deodorant.            Do not shave 48 hours prior to surgery.              Do not bring valuables to the hospital.            [facility] is not responsible for any belongings or valuables.  Do NOT Smoke (Tobacco/Vaping) or drink Alcohol 24 hours prior to your procedure If you use a CPAP at night, you may bring all equipment for your overnight stay.   Contacts, glasses, dentures or bridgework may not be worn into surgery.      For patients admitted to the hospital, discharge time will be determined by your treatment team.   Patients discharged the day of surgery will not be allowed to drive home, and someone needs to stay with them for 24 hours.    Special instructions:   [facility]- Preparing For Surgery  Before surgery, you can play an important role. Because skin is not sterile, your skin needs to be as free of germs as possible. You can reduce the number of germs on your skin by washing with CHG (chlorahexidine  gluconate) Soap before surgery.  CHG is an antiseptic cleaner which kills germs and bonds with the skin to continue killing germs even after washing.    Oral Hygiene is also important to reduce your risk of infection.  Remember - BRUSH YOUR TEETH THE MORNING OF SURGERY WITH YOUR REGULAR TOOTHPASTE  Please do not use if you have an allergy to CHG or antibacterial soaps. If your skin becomes reddened/irritated stop using the CHG.  Do not shave (including legs and underarms) for at least 48 hours prior to first CHG shower. It is OK to shave your face.  Please follow these instructions carefully.   1. Shower the NIGHT BEFORE SURGERY and the MORNING OF SURGERY with CHG Soap.   2. If you chose to wash your hair, wash your hair first as usual with your normal shampoo.  3. After you shampoo, rinse your hair and body thoroughly to remove the shampoo.  4. Use CHG as you would any other liquid soap. You can apply CHG directly to the skin and wash gently with a scrungie or a clean washcloth.   5. Apply the CHG  Soap to your body ONLY FROM THE NECK DOWN.  Do not use on open wounds or open sores. Avoid contact with your eyes, ears, mouth and genitals (private parts). Wash Face and genitals (private parts)  with your normal soap.   6. Wash thoroughly, paying special attention to the area where your surgery will be performed.  7. Thoroughly rinse your body with warm water from the neck down.  8. DO NOT shower/wash with your normal soap after using and rinsing off the CHG Soap.  9. Pat yourself dry with a CLEAN TOWEL.  10. Wear CLEAN PAJAMAS to bed the night before surgery  11. Place CLEAN SHEETS on your bed the night of your first shower and DO NOT SLEEP WITH PETS.   Day of Surgery: Wear Clean/Comfortable clothing the morning of surgery Do not apply any deodorants/lotions.   Remember to brush your teeth WITH YOUR REGULAR TOOTHPASTE.   Please read over the following fact sheets that you were  given.

## 2019-12-26 ENCOUNTER — Telehealth (HOSPITAL_COMMUNITY): Payer: Self-pay | Admitting: Cardiovascular Disease

## 2019-12-26 ENCOUNTER — Other Ambulatory Visit (HOSPITAL_COMMUNITY)
Admission: RE | Admit: 2019-12-26 | Discharge: 2019-12-26 | Disposition: A | Discharge status: Home / Self Care | Payer: Medicaid Other | Source: Ambulatory Visit | Attending: Thoracic Surgery (Cardiothoracic Vascular Surgery) | Admitting: Thoracic Surgery (Cardiothoracic Vascular Surgery)

## 2019-12-26 ENCOUNTER — Inpatient Hospital Stay (HOSPITAL_COMMUNITY)
Admission: RE | Admit: 2019-12-26 | Discharge: 2019-12-26 | Disposition: A | Discharge status: Home / Self Care | Payer: Medicaid Other | Source: Ambulatory Visit

## 2019-12-26 ENCOUNTER — Encounter (HOSPITAL_COMMUNITY): Payer: Self-pay

## 2019-12-26 DIAGNOSIS — Z01812 Encounter for preprocedural laboratory examination: Secondary | ICD-10-CM | POA: Insufficient documentation

## 2019-12-26 DIAGNOSIS — Z20822 Contact with and (suspected) exposure to covid-19: Secondary | ICD-10-CM | POA: Insufficient documentation

## 2019-12-26 DIAGNOSIS — C801 Malignant (primary) neoplasm, unspecified: Secondary | ICD-10-CM

## 2019-12-26 HISTORY — DX: Malignant (primary) neoplasm, unspecified: C80.1

## 2019-12-26 LAB — SARS CORONAVIRUS 2 (TAT 6-24 HRS): SARS Coronavirus 2: NEGATIVE

## 2019-12-26 NOTE — Telephone Encounter (Signed)
Just an FYI. We have made several attempts to contact this patient including sending a letter to schedule or reschedule their echocardiogram. We will be removing the patient from the echo [place].   12/16/19 MAILED LETTER [facility]  12/11/19 Patient No Showed 12/11/2019      Thank you

## 2019-12-26 NOTE — Progress Notes (Signed)
Patient missed PAT appointment  Gave instructions and went through all questions with patient.   Patient stated understanding of instructions.   Patient denies shortness of breath, cough, fever, and chest pain at time of phone interview.   Covid test - 12-26-19

## 2019-12-27 NOTE — Telephone Encounter (Signed)
Noted. Thanks.

## 2019-12-30 ENCOUNTER — Inpatient Hospital Stay: Payer: Medicaid Other

## 2019-12-30 ENCOUNTER — Inpatient Hospital Stay (HOSPITAL_COMMUNITY): Payer: Medicaid Other | Admitting: Registered Nurse

## 2019-12-30 ENCOUNTER — Inpatient Hospital Stay (HOSPITAL_COMMUNITY): Payer: Medicaid Other

## 2019-12-30 ENCOUNTER — Inpatient Hospital Stay (HOSPITAL_COMMUNITY)
Admission: RE | Admit: 2019-12-30 | Discharge: 2020-01-03 | DRG: 164 | Disposition: A | Discharge status: Home / Self Care | Payer: Medicaid Other | Attending: Thoracic Surgery (Cardiothoracic Vascular Surgery) | Admitting: Thoracic Surgery (Cardiothoracic Vascular Surgery)

## 2019-12-30 ENCOUNTER — Inpatient Hospital Stay: Payer: Medicaid Other | Admitting: Internal Medicine

## 2019-12-30 ENCOUNTER — Other Ambulatory Visit: Payer: Self-pay

## 2019-12-30 ENCOUNTER — Encounter (HOSPITAL_COMMUNITY)
Admission: RE | Disposition: A | Discharge status: Home / Self Care | Payer: Self-pay | Source: Home / Self Care | Attending: Thoracic Surgery (Cardiothoracic Vascular Surgery)

## 2019-12-30 ENCOUNTER — Encounter (HOSPITAL_COMMUNITY): Payer: Self-pay | Admitting: Thoracic Surgery (Cardiothoracic Vascular Surgery)

## 2019-12-30 DIAGNOSIS — M109 Gout, unspecified: Secondary | ICD-10-CM | POA: Diagnosis present

## 2019-12-30 DIAGNOSIS — I10 Essential (primary) hypertension: Secondary | ICD-10-CM | POA: Diagnosis present

## 2019-12-30 DIAGNOSIS — C3481 Malignant neoplasm of overlapping sites of right bronchus and lung: Principal | ICD-10-CM | POA: Diagnosis present

## 2019-12-30 DIAGNOSIS — J9811 Atelectasis: Secondary | ICD-10-CM | POA: Diagnosis present

## 2019-12-30 DIAGNOSIS — K219 Gastro-esophageal reflux disease without esophagitis: Secondary | ICD-10-CM | POA: Diagnosis present

## 2019-12-30 DIAGNOSIS — J9382 Other air leak: Secondary | ICD-10-CM | POA: Diagnosis not present

## 2019-12-30 DIAGNOSIS — C3490 Malignant neoplasm of unspecified part of unspecified bronchus or lung: Secondary | ICD-10-CM | POA: Diagnosis not present

## 2019-12-30 DIAGNOSIS — J439 Emphysema, unspecified: Secondary | ICD-10-CM | POA: Diagnosis present

## 2019-12-30 DIAGNOSIS — J939 Pneumothorax, unspecified: Secondary | ICD-10-CM | POA: Diagnosis not present

## 2019-12-30 DIAGNOSIS — Z4682 Encounter for fitting and adjustment of non-vascular catheter: Secondary | ICD-10-CM

## 2019-12-30 DIAGNOSIS — I700 Atherosclerosis of aorta: Secondary | ICD-10-CM | POA: Diagnosis present

## 2019-12-30 DIAGNOSIS — Z9889 Other specified postprocedural states: Secondary | ICD-10-CM

## 2019-12-30 DIAGNOSIS — Z902 Acquired absence of lung [part of]: Secondary | ICD-10-CM

## 2019-12-30 DIAGNOSIS — Z9221 Personal history of antineoplastic chemotherapy: Secondary | ICD-10-CM

## 2019-12-30 DIAGNOSIS — C801 Malignant (primary) neoplasm, unspecified: Secondary | ICD-10-CM

## 2019-12-30 DIAGNOSIS — C3491 Malignant neoplasm of unspecified part of right bronchus or lung: Secondary | ICD-10-CM | POA: Diagnosis present

## 2019-12-30 DIAGNOSIS — Z87891 Personal history of nicotine dependence: Secondary | ICD-10-CM | POA: Diagnosis not present

## 2019-12-30 DIAGNOSIS — Z923 Personal history of irradiation: Secondary | ICD-10-CM

## 2019-12-30 HISTORY — PX: VIDEO BRONCHOSCOPY: SHX5072

## 2019-12-30 HISTORY — PX: INTERCOSTAL NERVE BLOCK: SHX5021

## 2019-12-30 HISTORY — PX: LYMPH NODE DISSECTION: SHX5087

## 2019-12-30 LAB — BLOOD GAS, ARTERIAL
Acid-Base Excess: 0.8 mmol/L (ref 0.0–2.0)
Bicarbonate: 25 mmol/L (ref 20.0–28.0)
FIO2: 21
O2 Saturation: 95.6 %
Patient temperature: 37
pCO2 arterial: 41.3 mmHg (ref 32.0–48.0)
pH, Arterial: 7.4 (ref 7.350–7.450)
pO2, Arterial: 80.6 mmHg — ABNORMAL LOW (ref 83.0–108.0)

## 2019-12-30 LAB — COMPREHENSIVE METABOLIC PANEL
ALT: 17 U/L (ref 0–44)
AST: 17 U/L (ref 15–41)
Albumin: 3.3 g/dL — ABNORMAL LOW (ref 3.5–5.0)
Alkaline Phosphatase: 63 U/L (ref 38–126)
Anion gap: 8 (ref 5–15)
BUN: 14 mg/dL (ref 8–23)
CO2: 25 mmol/L (ref 22–32)
Calcium: 9.4 mg/dL (ref 8.9–10.3)
Chloride: 106 mmol/L (ref 98–111)
Creatinine, Ser: 1.02 mg/dL — ABNORMAL HIGH (ref 0.44–1.00)
GFR calc Af Amer: >60 mL/min (ref >60)
GFR calc non Af Amer: 58 mL/min — ABNORMAL LOW (ref >60)
Glucose, Bld: 95 mg/dL (ref 70–99)
Potassium: 3.7 mmol/L (ref 3.5–5.1)
Sodium: 139 mmol/L (ref 135–145)
Total Bilirubin: 0.4 mg/dL (ref 0.3–1.2)
Total Protein: 6.6 g/dL (ref 6.5–8.1)

## 2019-12-30 LAB — CBC
HCT: 39 % (ref 36.0–46.0)
Hemoglobin: 12.6 g/dL (ref 12.0–15.0)
MCH: 32.6 pg (ref 26.0–34.0)
MCHC: 32.3 g/dL (ref 30.0–36.0)
MCV: 100.8 fL — ABNORMAL HIGH (ref 80.0–100.0)
Platelets: 315 10*3/uL (ref 150–400)
RBC: 3.87 MIL/uL (ref 3.87–5.11)
RDW: 12.4 % (ref 11.5–15.5)
WBC: 3.1 10*3/uL — ABNORMAL LOW (ref 4.0–10.5)
nRBC: 0 % (ref 0.0–0.2)

## 2019-12-30 LAB — URINALYSIS, ROUTINE W REFLEX MICROSCOPIC
Bacteria, UA: NONE SEEN
Bilirubin Urine: NEGATIVE
Glucose, UA: NEGATIVE mg/dL
Ketones, ur: NEGATIVE mg/dL
Nitrite: NEGATIVE
Protein, ur: NEGATIVE mg/dL
Specific Gravity, Urine: 1.015 (ref 1.005–1.030)
pH: 5 (ref 5.0–8.0)

## 2019-12-30 LAB — ABO/RH: ABO/RH(D): O POS

## 2019-12-30 LAB — TYPE AND SCREEN
ABO/RH(D): O POS
Antibody Screen: NEGATIVE

## 2019-12-30 LAB — GLUCOSE, CAPILLARY
Glucose-Capillary: 116 mg/dL — ABNORMAL HIGH (ref 70–99)
Glucose-Capillary: 104 mg/dL — ABNORMAL HIGH (ref 70–99)

## 2019-12-30 LAB — APTT: aPTT: 30 seconds (ref 24–36)

## 2019-12-30 LAB — PROTIME-INR
INR: 0.9 (ref 0.8–1.2)
Prothrombin Time: 11.9 seconds (ref 11.4–15.2)

## 2019-12-30 LAB — SURGICAL PCR SCREEN
MRSA, PCR: NEGATIVE
Staphylococcus aureus: NEGATIVE

## 2019-12-30 IMAGING — CR DG CHEST 2V
2 series · 2 of 2 positions shown · non-contrast
Comparison: [DATE], CT chest, [DATE]

CLINICAL DATA: Preoperative, right lung cancer

EXAM:
CHEST - 2 VIEW

[w chest pa]
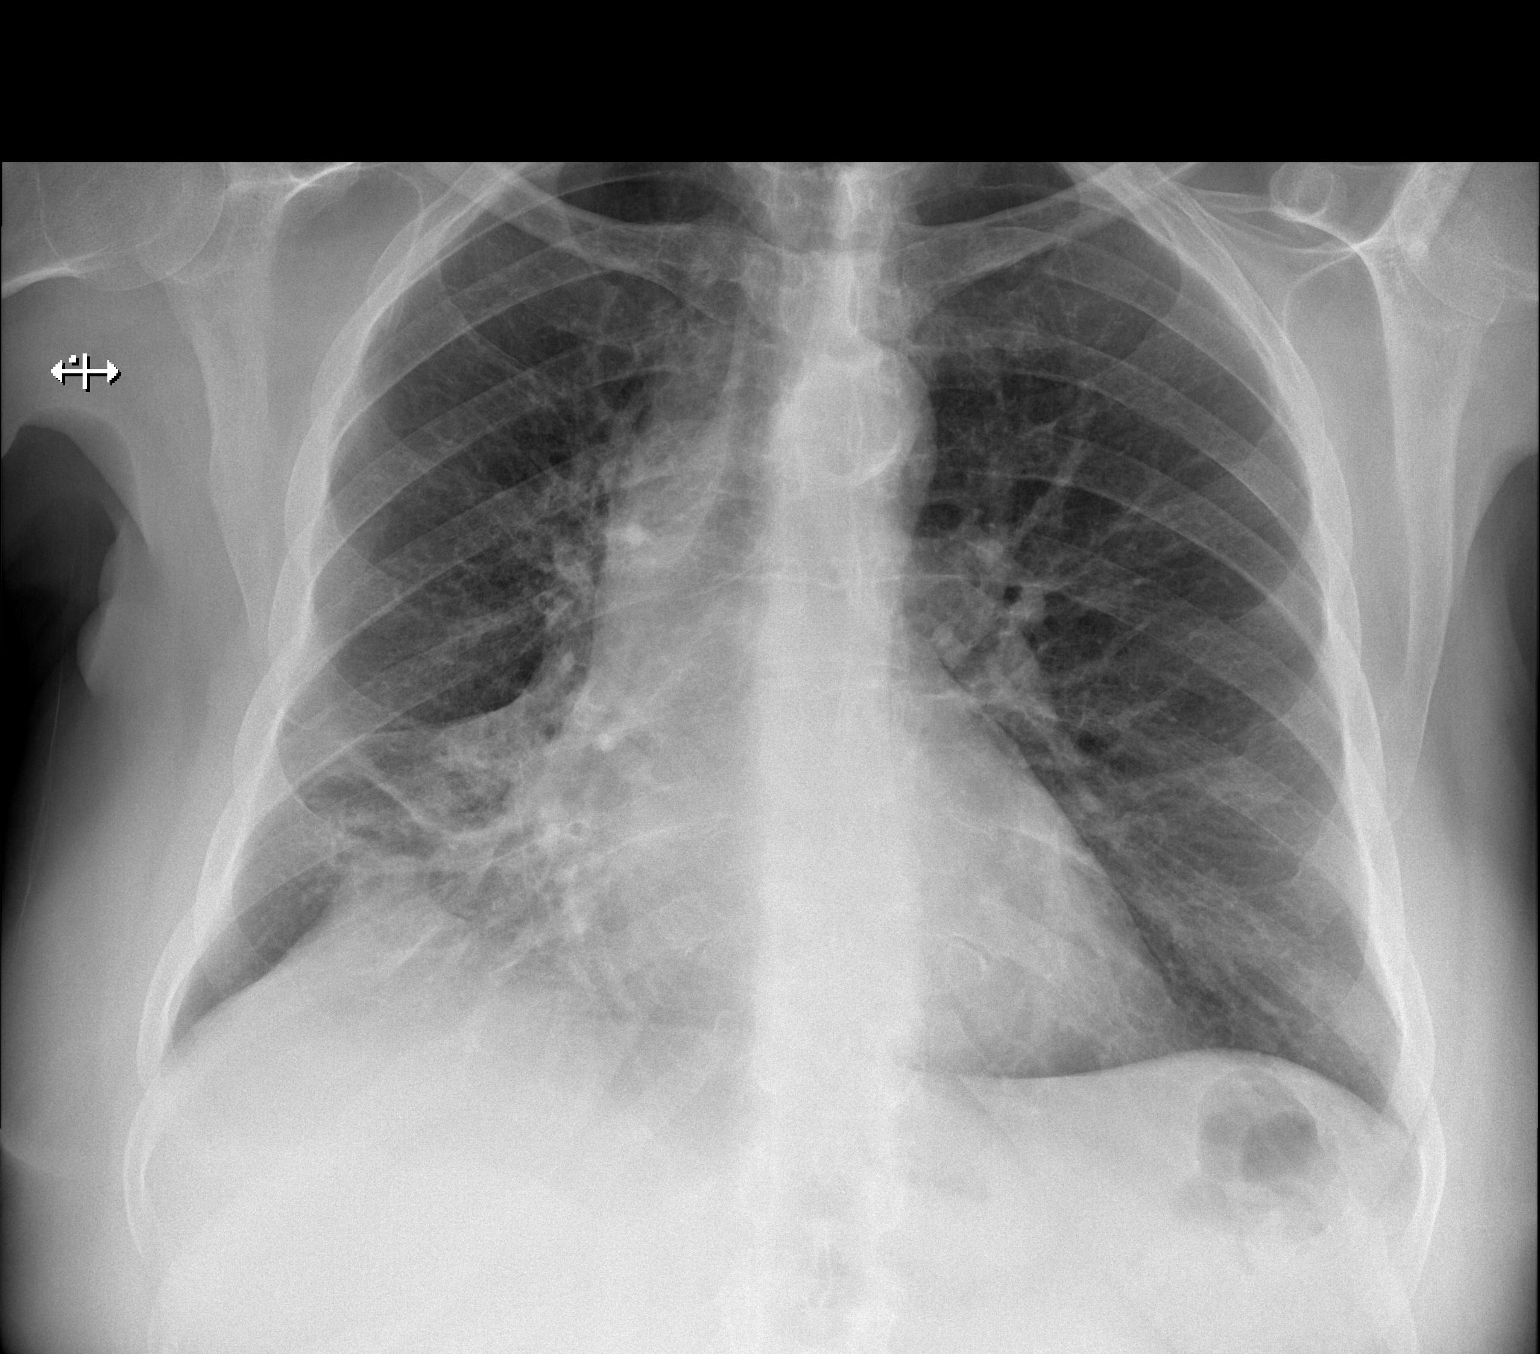

[w chest lat]
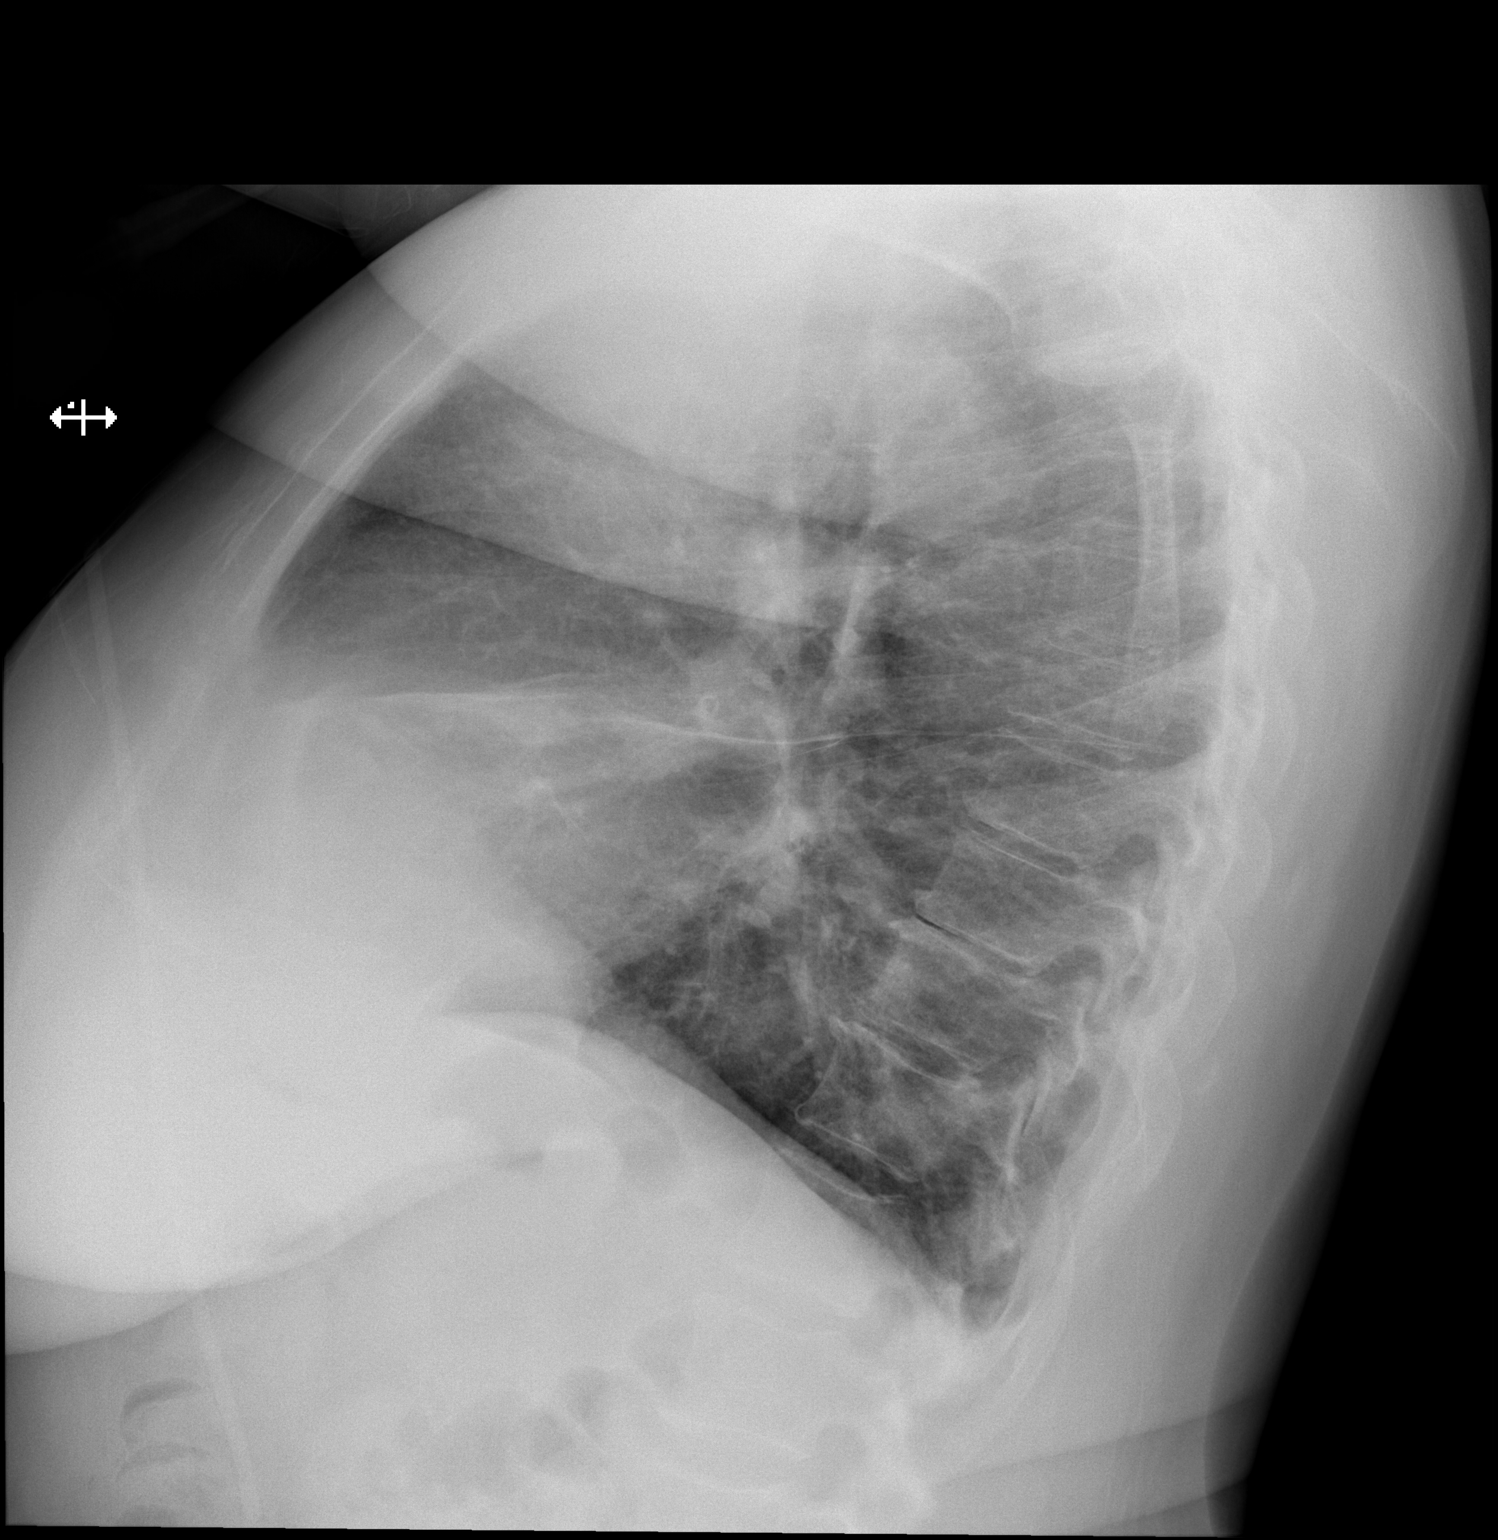

[2 of 2 positions shown; findings below may reference images not displayed]

FINDINGS: Interval increase in consolidation of the right middle lobe, in
keeping with findings of prior CT. There is no acute appearing
airspace opacity. The heart mediastinum are unremarkable. Disc
degenerative disease of the thoracic spine.
IMPRESSION: Interval increase in consolidation of the right middle lobe, in
keeping with findings of prior CT and treated appearance of lung
malignancy. No acute appearing airspace opacity.

## 2019-12-30 IMAGING — DX DG CHEST 1V PORT
1 series · 1 of 1 positions shown · non-contrast
Comparison: Chest x-ray [DATE].

CLINICAL DATA: 63-year-old female with history of robotic assisted
lobectomy.

EXAM:
PORTABLE CHEST 1 VIEW

[chest ap]
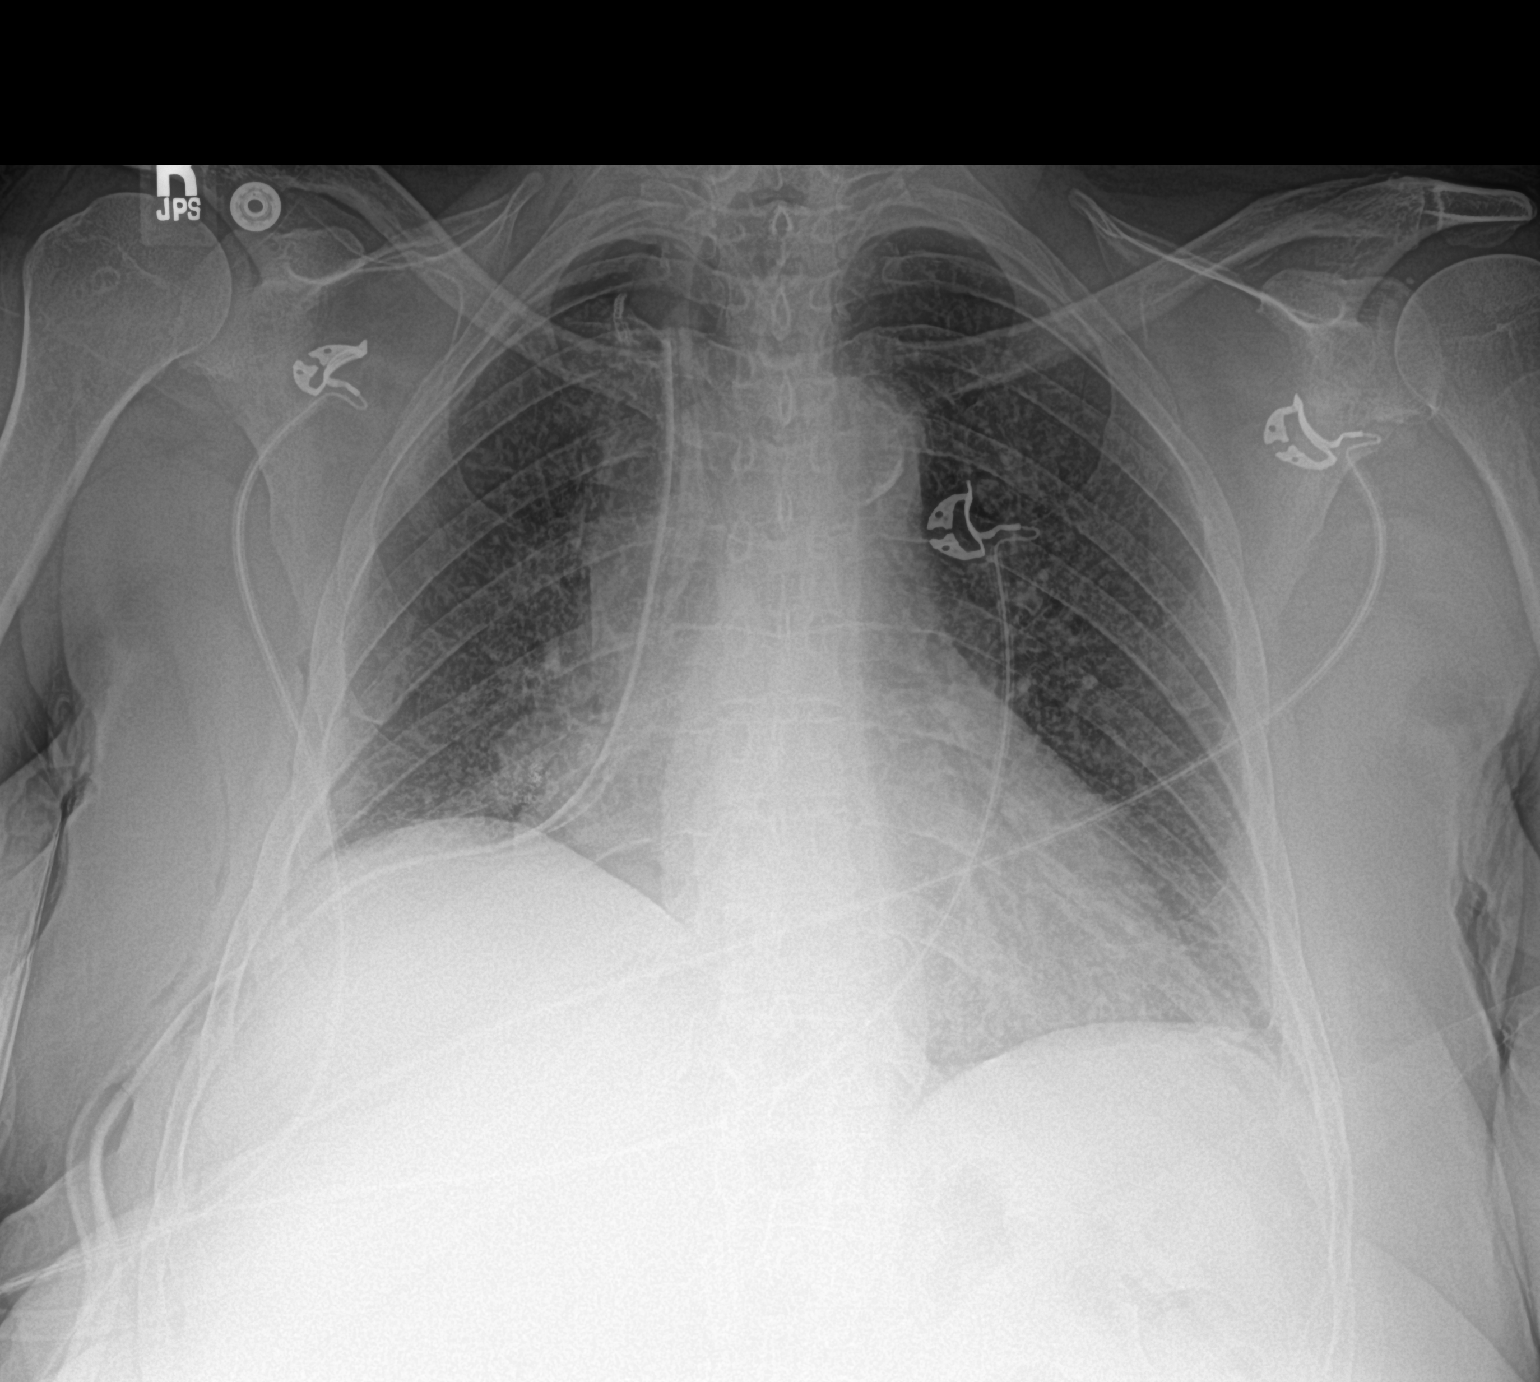

[1 of 1 positions shown; findings below may reference images not displayed]

FINDINGS: Postoperative changes of right middle and lower lobectomy with
compensatory hyperexpansion of the right upper lobe. Small right
apical pneumothorax. Right-sided chest tube is in position with tip
near the apex of the right hemithorax. Elevation of the right
hemidiaphragm. Left lung is clear. No pleural effusions. No evidence
of pulmonary edema. Heart size is mildly enlarged. Upper mediastinal
contours are within normal limits. Aortic atherosclerosis.
Subcutaneous emphysema in the lateral aspect of the right chest
wall.
IMPRESSION: 1. Status post right middle and lower lobectomy with right-sided
chest tube in position and small right apical pneumothorax.
2. Mild cardiomegaly.
3. Aortic atherosclerosis.

## 2019-12-30 SURGERY — LOBECTOMY, LUNG, ROBOT-ASSISTED, USING VATS
Anesthesia: General | Site: Chest | Laterality: Right

## 2019-12-30 MED ORDER — PROPOFOL 10 MG/ML IV BOLUS
INTRAVENOUS | Status: AC
  Filled 2019-12-30: qty 20

## 2019-12-30 MED ORDER — BUPIVACAINE HCL (PF) 0.5 % IJ SOLN
INTRAMUSCULAR | Status: AC
  Filled 2019-12-30: qty 30

## 2019-12-30 MED ORDER — ORAL CARE MOUTH RINSE: 15.0000 mL | Freq: Once | OROMUCOSAL | Status: AC

## 2019-12-30 MED ORDER — INSULIN ASPART 100 UNIT/ML ~~LOC~~ SOLN: 0.0000 [IU] | Freq: Three times a day (TID) | SUBCUTANEOUS | Status: DC

## 2019-12-30 MED ORDER — ACETAMINOPHEN 500 MG PO TABS: 1000.0000 mg | ORAL_TABLET | Freq: Once | ORAL | Status: DC | PRN

## 2019-12-30 MED ORDER — ENOXAPARIN SODIUM 40 MG/0.4ML ~~LOC~~ SOLN
40.0000 mg | Freq: Every day | SUBCUTANEOUS | Status: DC
  Administered 2019-12-31 – 2020-01-02 (×3): 40 mg via SUBCUTANEOUS
  Filled 2019-12-30 (×3): qty 0.4

## 2019-12-30 MED ORDER — FENTANYL CITRATE (PF) 250 MCG/5ML IJ SOLN
INTRAMUSCULAR | Status: AC
  Filled 2019-12-30: qty 5

## 2019-12-30 MED ORDER — ALBUMIN HUMAN 5 % IV SOLN: INTRAVENOUS | Status: DC | PRN

## 2019-12-30 MED ORDER — HEMOSTATIC AGENTS (NO CHARGE) OPTIME
TOPICAL | Status: DC | PRN
  Administered 2019-12-30: 1 via TOPICAL

## 2019-12-30 MED ORDER — FENTANYL CITRATE (PF) 100 MCG/2ML IJ SOLN
INTRAMUSCULAR | Status: AC
  Administered 2019-12-30: 25 ug via INTRAVENOUS
  Filled 2019-12-30: qty 2

## 2019-12-30 MED ORDER — SODIUM CHLORIDE 0.9 % IV SOLN
INTRAVENOUS | Status: AC | PRN
  Administered 2019-12-30: 1000 mL

## 2019-12-30 MED ORDER — SUGAMMADEX SODIUM 200 MG/2ML IV SOLN
INTRAVENOUS | Status: DC | PRN
  Administered 2019-12-30: 100 mg via INTRAVENOUS
  Administered 2019-12-30: 200 mg via INTRAVENOUS

## 2019-12-30 MED ORDER — BISACODYL 5 MG PO TBEC
10.0000 mg | DELAYED_RELEASE_TABLET | Freq: Every day | ORAL | Status: DC
  Administered 2019-12-31 – 2020-01-01 (×2): 10 mg via ORAL
  Filled 2019-12-30 (×2): qty 2

## 2019-12-30 MED ORDER — CEFAZOLIN SODIUM-DEXTROSE 2-4 GM/100ML-% IV SOLN
2.0000 g | Freq: Three times a day (TID) | INTRAVENOUS | Status: AC
  Administered 2019-12-30 – 2019-12-31 (×2): 2 g via INTRAVENOUS
  Filled 2019-12-30 (×2): qty 100

## 2019-12-30 MED ORDER — MIDAZOLAM HCL 2 MG/2ML IJ SOLN
INTRAMUSCULAR | Status: AC
  Filled 2019-12-30: qty 2

## 2019-12-30 MED ORDER — BUPIVACAINE LIPOSOME 1.3 % IJ SUSP
20.0000 mL | Freq: Once | INTRAMUSCULAR | Status: DC
  Filled 2019-12-30: qty 20

## 2019-12-30 MED ORDER — OXYCODONE HCL 5 MG/5ML PO SOLN: 5.0000 mg | Freq: Once | ORAL | Status: DC | PRN

## 2019-12-30 MED ORDER — PROPOFOL 10 MG/ML IV BOLUS
INTRAVENOUS | Status: DC | PRN
  Administered 2019-12-30: 110 mg via INTRAVENOUS
  Administered 2019-12-30: 30 mg via INTRAVENOUS

## 2019-12-30 MED ORDER — SODIUM CHLORIDE FLUSH 0.9 % IV SOLN
INTRAVENOUS | Status: DC | PRN
  Administered 2019-12-30: 100 mL

## 2019-12-30 MED ORDER — KETOROLAC TROMETHAMINE 15 MG/ML IJ SOLN
15.0000 mg | Freq: Four times a day (QID) | INTRAMUSCULAR | Status: DC
  Administered 2019-12-30 – 2020-01-03 (×16): 15 mg via INTRAVENOUS
  Filled 2019-12-30 (×16): qty 1

## 2019-12-30 MED ORDER — ACETAMINOPHEN 500 MG PO TABS
1000.0000 mg | ORAL_TABLET | Freq: Four times a day (QID) | ORAL | Status: DC
  Administered 2019-12-30 – 2020-01-03 (×14): 1000 mg via ORAL
  Filled 2019-12-30 (×14): qty 2

## 2019-12-30 MED ORDER — ACETAMINOPHEN 160 MG/5ML PO SOLN: 1000.0000 mg | Freq: Once | ORAL | Status: DC | PRN

## 2019-12-30 MED ORDER — ONDANSETRON HCL 4 MG/2ML IJ SOLN: 4.0000 mg | Freq: Four times a day (QID) | INTRAMUSCULAR | Status: DC | PRN

## 2019-12-30 MED ORDER — DEXAMETHASONE SODIUM PHOSPHATE 10 MG/ML IJ SOLN
INTRAMUSCULAR | Status: DC | PRN
  Administered 2019-12-30: 10 mg via INTRAVENOUS

## 2019-12-30 MED ORDER — TRAMADOL HCL 50 MG PO TABS
50.0000 mg | ORAL_TABLET | Freq: Four times a day (QID) | ORAL | Status: DC | PRN
  Administered 2019-12-31 – 2020-01-03 (×2): 100 mg via ORAL
  Filled 2019-12-30 (×2): qty 2

## 2019-12-30 MED ORDER — SODIUM CHLORIDE 0.9 % IV SOLN: INTRAVENOUS | Status: DC

## 2019-12-30 MED ORDER — PHENYLEPHRINE HCL-NACL 10-0.9 MG/250ML-% IV SOLN
INTRAVENOUS | Status: DC | PRN
  Administered 2019-12-30: 25 ug/min via INTRAVENOUS

## 2019-12-30 MED ORDER — CHLORHEXIDINE GLUCONATE 0.12 % MT SOLN
15.0000 mL | Freq: Once | OROMUCOSAL | Status: AC
  Administered 2019-12-30: 15 mL via OROMUCOSAL
  Filled 2019-12-30: qty 15

## 2019-12-30 MED ORDER — FENTANYL CITRATE (PF) 100 MCG/2ML IJ SOLN: 25.0000 ug | INTRAMUSCULAR | Status: DC | PRN

## 2019-12-30 MED ORDER — ESMOLOL HCL 100 MG/10ML IV SOLN
INTRAVENOUS | Status: AC
  Filled 2019-12-30: qty 10

## 2019-12-30 MED ORDER — MIDAZOLAM HCL 2 MG/2ML IJ SOLN
INTRAMUSCULAR | Status: DC | PRN
  Administered 2019-12-30: 2 mg via INTRAVENOUS

## 2019-12-30 MED ORDER — LACTATED RINGERS IV SOLN: INTRAVENOUS | Status: DC

## 2019-12-30 MED ORDER — ACETAMINOPHEN 160 MG/5ML PO SOLN: 1000.0000 mg | Freq: Four times a day (QID) | ORAL | Status: DC

## 2019-12-30 MED ORDER — DEXMEDETOMIDINE (PRECEDEX) IN NS 20 MCG/5ML (4 MCG/ML) IV SYRINGE
PREFILLED_SYRINGE | INTRAVENOUS | Status: DC | PRN
  Administered 2019-12-30: 12 ug via INTRAVENOUS

## 2019-12-30 MED ORDER — ESMOLOL HCL 100 MG/10ML IV SOLN
INTRAVENOUS | Status: DC | PRN
  Administered 2019-12-30 (×2): 30 mg via INTRAVENOUS
  Administered 2019-12-30: 40 mg via INTRAVENOUS

## 2019-12-30 MED ORDER — OXYCODONE HCL 5 MG PO TABS
5.0000 mg | ORAL_TABLET | ORAL | Status: DC | PRN
  Administered 2019-12-30 – 2020-01-02 (×7): 10 mg via ORAL
  Filled 2019-12-30 (×7): qty 2

## 2019-12-30 MED ORDER — ROCURONIUM BROMIDE 10 MG/ML (PF) SYRINGE
PREFILLED_SYRINGE | INTRAVENOUS | Status: DC | PRN
  Administered 2019-12-30: 70 mg via INTRAVENOUS
  Administered 2019-12-30: 20 mg via INTRAVENOUS
  Administered 2019-12-30: 30 mg via INTRAVENOUS
  Administered 2019-12-30: 10 mg via INTRAVENOUS

## 2019-12-30 MED ORDER — OXYCODONE HCL 5 MG PO TABS: 5.0000 mg | ORAL_TABLET | Freq: Once | ORAL | Status: DC | PRN

## 2019-12-30 MED ORDER — LIDOCAINE 2% (20 MG/ML) 5 ML SYRINGE
INTRAMUSCULAR | Status: DC | PRN
  Administered 2019-12-30: 60 mg via INTRAVENOUS

## 2019-12-30 MED ORDER — SENNOSIDES-DOCUSATE SODIUM 8.6-50 MG PO TABS
1.0000 | ORAL_TABLET | Freq: Every day | ORAL | Status: DC
  Administered 2019-12-30 – 2020-01-02 (×4): 1 via ORAL
  Filled 2019-12-30 (×4): qty 1

## 2019-12-30 MED ORDER — 0.9 % SODIUM CHLORIDE (POUR BTL) OPTIME
TOPICAL | Status: DC | PRN
  Administered 2019-12-30: 2000 mL

## 2019-12-30 MED ORDER — ONDANSETRON HCL 4 MG/2ML IJ SOLN
INTRAMUSCULAR | Status: DC | PRN
  Administered 2019-12-30: 4 mg via INTRAVENOUS

## 2019-12-30 MED ORDER — ACETAMINOPHEN 10 MG/ML IV SOLN
INTRAVENOUS | Status: AC
  Administered 2019-12-30: 1000 mg via INTRAVENOUS
  Filled 2019-12-30: qty 100

## 2019-12-30 MED ORDER — CEFAZOLIN SODIUM-DEXTROSE 2-4 GM/100ML-% IV SOLN
2.0000 g | INTRAVENOUS | Status: AC
  Administered 2019-12-30: 2 g via INTRAVENOUS
  Filled 2019-12-30: qty 100

## 2019-12-30 MED ORDER — FENTANYL CITRATE (PF) 250 MCG/5ML IJ SOLN
INTRAMUSCULAR | Status: DC | PRN
Start: 2019-12-30 — End: 2019-12-30
  Administered 2019-12-30: 100 ug via INTRAVENOUS
  Administered 2019-12-30: 50 ug via INTRAVENOUS
  Administered 2019-12-30: 100 ug via INTRAVENOUS
  Administered 2019-12-30: 50 ug via INTRAVENOUS
  Administered 2019-12-30: 100 ug via INTRAVENOUS
  Administered 2019-12-30 (×2): 50 ug via INTRAVENOUS

## 2019-12-30 MED ORDER — ACETAMINOPHEN 10 MG/ML IV SOLN: 1000.0000 mg | Freq: Once | INTRAVENOUS | Status: DC | PRN

## 2019-12-30 MED ORDER — LACTATED RINGERS IV SOLN: INTRAVENOUS | Status: DC | PRN

## 2019-12-30 SURGICAL SUPPLY — 137 items
ADAPTER VALVE BIOPSY EBUS (MISCELLANEOUS) IMPLANT
ADPTR VALVE BIOPSY EBUS (MISCELLANEOUS)
APPLIER CLIP ROT 10 11.4 M/L (STAPLE)
BLADE CLIPPER SURG (BLADE) IMPLANT
BLADE SURG SZ11 CARB STEEL (BLADE) ×4 IMPLANT
BNDG COHESIVE 6X5 TAN STRL LF (GAUZE/BANDAGES/DRESSINGS) IMPLANT
BRUSH CYTOL CELLEBRITY 1.5X140 (MISCELLANEOUS) IMPLANT
CANISTER SUCT 3000ML PPV (MISCELLANEOUS) ×8 IMPLANT
CANNULA REDUC XI 12-8 STAPL (CANNULA) ×2
CANNULA REDUC XI 12-8MM STAPL (CANNULA) ×2
CANNULA REDUCER 12-8 DVNC XI (CANNULA) ×4 IMPLANT
CATH THORACIC 28FR (CATHETERS) IMPLANT
CATH THORACIC 28FR RT ANG (CATHETERS) IMPLANT
CATH THORACIC 36FR (CATHETERS) IMPLANT
CATH THORACIC 36FR RT ANG (CATHETERS) IMPLANT
CLIP APPLIE ROT 10 11.4 M/L (STAPLE) IMPLANT
CLIP VESOCCLUDE MED 6/CT (CLIP) IMPLANT
CNTNR URN SCR LID CUP LEK RST (MISCELLANEOUS) ×20 IMPLANT
CONN ST 1/4X3/8  BEN (MISCELLANEOUS) ×2
CONN ST 1/4X3/8 BEN (MISCELLANEOUS) ×2 IMPLANT
CONN Y 3/8X3/8X3/8  BEN (MISCELLANEOUS)
CONN Y 3/8X3/8X3/8 BEN (MISCELLANEOUS) IMPLANT
CONT SPEC 4OZ STRL OR WHT (MISCELLANEOUS) ×20
COVER BACK TABLE 60X90IN (DRAPES) ×4 IMPLANT
DEFOGGER SCOPE WARMER CLEARIFY (MISCELLANEOUS) ×4 IMPLANT
DERMABOND ADVANCED (GAUZE/BANDAGES/DRESSINGS) ×2
DERMABOND ADVANCED .7 DNX12 (GAUZE/BANDAGES/DRESSINGS) ×2 IMPLANT
DRAIN CHANNEL 28F RND 3/8 FF (WOUND CARE) ×4 IMPLANT
DRAIN CHANNEL 32F RND 10.7 FF (WOUND CARE) IMPLANT
DRAPE ARM DVNC X/XI (DISPOSABLE) ×8 IMPLANT
DRAPE COLUMN DVNC XI (DISPOSABLE) ×2 IMPLANT
DRAPE CV SPLIT W-CLR ANES SCRN (DRAPES) ×4 IMPLANT
DRAPE DA VINCI XI ARM (DISPOSABLE) ×8
DRAPE DA VINCI XI COLUMN (DISPOSABLE) ×2
DRAPE INCISE IOBAN 66X45 STRL (DRAPES) IMPLANT
DRAPE ORTHO SPLIT 77X108 STRL (DRAPES) ×2
DRAPE SURG ORHT 6 SPLT 77X108 (DRAPES) ×2 IMPLANT
DRAPE WARM FLUID 44X44 (DRAPES) ×4 IMPLANT
ELECT BLADE 6.5 EXT (BLADE) ×4 IMPLANT
ELECT REM PT RETURN 9FT ADLT (ELECTROSURGICAL) ×4
ELECTRODE REM PT RTRN 9FT ADLT (ELECTROSURGICAL) ×2 IMPLANT
FILTER STRAW FLUID ASPIR (MISCELLANEOUS) IMPLANT
FORCEPS BIOP RJ4 1.8 (CUTTING FORCEPS) IMPLANT
FORCEPS RADIAL JAW LRG 4 PULM (INSTRUMENTS) IMPLANT
GAUZE KITTNER 4X5 RF (MISCELLANEOUS) ×8 IMPLANT
GAUZE SPONGE 4X4 12PLY STRL (GAUZE/BANDAGES/DRESSINGS) ×4 IMPLANT
GLOVE SURG SIGNA 7.5 PF LTX (GLOVE) ×4 IMPLANT
GLOVE TRIUMPH SURG SIZE 7.5 (KITS) ×8 IMPLANT
GOWN STRL REUS W/ TWL LRG LVL3 (GOWN DISPOSABLE) ×4 IMPLANT
GOWN STRL REUS W/ TWL XL LVL3 (GOWN DISPOSABLE) ×6 IMPLANT
GOWN STRL REUS W/TWL 2XL LVL3 (GOWN DISPOSABLE) ×8 IMPLANT
GOWN STRL REUS W/TWL LRG LVL3 (GOWN DISPOSABLE) ×4
GOWN STRL REUS W/TWL XL LVL3 (GOWN DISPOSABLE) ×6
HEMOSTAT SURGICEL 2X14 (HEMOSTASIS) ×12 IMPLANT
IRRIGATION STRYKERFLOW (MISCELLANEOUS) ×2 IMPLANT
IRRIGATOR STRYKERFLOW (MISCELLANEOUS) ×4
KIT BASIN OR (CUSTOM PROCEDURE TRAY) ×4 IMPLANT
KIT CLEAN ENDO COMPLIANCE (KITS) ×4 IMPLANT
KIT SUCTION CATH 14FR (SUCTIONS) IMPLANT
KIT TURNOVER KIT B (KITS) ×4 IMPLANT
LOOP VESSEL SUPERMAXI WHITE (MISCELLANEOUS) IMPLANT
MARKER SKIN DUAL TIP RULER LAB (MISCELLANEOUS) ×4 IMPLANT
NEEDLE HYPO 25GX1X1/2 BEV (NEEDLE) ×4 IMPLANT
NEEDLE SPNL 22GX3.5 QUINCKE BK (NEEDLE) ×4 IMPLANT
NS IRRIG 1000ML POUR BTL (IV SOLUTION) ×8 IMPLANT
OIL SILICONE PENTAX (PARTS (SERVICE/REPAIRS)) ×4 IMPLANT
PACK CHEST (CUSTOM PROCEDURE TRAY) ×4 IMPLANT
PAD ARMBOARD 7.5X6 YLW CONV (MISCELLANEOUS) ×8 IMPLANT
PATTIES SURGICAL 3 X3 (GAUZE/BANDAGES/DRESSINGS) ×2
PATTIES SURGICAL 3X3 (GAUZE/BANDAGES/DRESSINGS) ×2 IMPLANT
RADIAL JAW LRG 4 PULMONARY (INSTRUMENTS)
RELOAD STAPLER 2.5X45 WHT DVNC (STAPLE) ×8 IMPLANT
RELOAD STAPLER 3.5X45 BLU DVNC (STAPLE) ×12 IMPLANT
RELOAD STAPLER 4.3X45 GRN DVNC (STAPLE) ×2 IMPLANT
SCISSORS LAP 5X35 DISP (ENDOMECHANICALS) IMPLANT
SEAL CANN UNIV 5-8 DVNC XI (MISCELLANEOUS) ×4 IMPLANT
SEAL XI 5MM-8MM UNIVERSAL (MISCELLANEOUS) ×4
SEALANT PROGEL (MISCELLANEOUS) IMPLANT
SEALANT SURG COSEAL 4ML (VASCULAR PRODUCTS) IMPLANT
SEALANT SURG COSEAL 8ML (VASCULAR PRODUCTS) IMPLANT
SEALER SYNCHRO 8 IS4000 DV (MISCELLANEOUS) ×2
SEALER SYNCHRO 8 IS4000 DVNC (MISCELLANEOUS) ×2 IMPLANT
SET TUBE SMOKE EVAC HIGH FLOW (TUBING) ×4 IMPLANT
SHEARS HARMONIC HDI 20CM (ELECTROSURGICAL) IMPLANT
SOLUTION ELECTROLUBE (MISCELLANEOUS) IMPLANT
SPECIMEN JAR MEDIUM (MISCELLANEOUS) IMPLANT
SPONGE INTESTINAL PEANUT (DISPOSABLE) IMPLANT
SPONGE TONSIL TAPE 1 RFD (DISPOSABLE) IMPLANT
STAPLER 45 SUREFORM CVD (STAPLE) ×2
STAPLER 45 SUREFORM CVD DVNC (STAPLE) ×2 IMPLANT
STAPLER CANNULA SEAL DVNC XI (STAPLE) ×4 IMPLANT
STAPLER CANNULA SEAL XI (STAPLE) ×4
STAPLER RELOAD 2.5X45 WHITE (STAPLE) ×8
STAPLER RELOAD 2.5X45 WHT DVNC (STAPLE) ×8
STAPLER RELOAD 3.5X45 BLU DVNC (STAPLE) ×12
STAPLER RELOAD 3.5X45 BLUE (STAPLE) ×12
STAPLER RELOAD 4.3X45 GREEN (STAPLE) ×2
STAPLER RELOAD 4.3X45 GRN DVNC (STAPLE) ×2
SUT PDS AB 3-0 SH 27 (SUTURE) IMPLANT
SUT PROLENE 4 0 RB 1 (SUTURE)
SUT PROLENE 4-0 RB1 .5 CRCL 36 (SUTURE) IMPLANT
SUT SILK  1 MH (SUTURE) ×6
SUT SILK 1 MH (SUTURE) ×6 IMPLANT
SUT SILK 1 TIES 10X30 (SUTURE) ×4 IMPLANT
SUT SILK 2 0 SH (SUTURE) ×4 IMPLANT
SUT SILK 2 0SH CR/8 30 (SUTURE) ×4 IMPLANT
SUT SILK 3 0 SH 30 (SUTURE) IMPLANT
SUT SILK 3 0SH CR/8 30 (SUTURE) IMPLANT
SUT VIC AB 1 CTX 36 (SUTURE) ×2
SUT VIC AB 1 CTX36XBRD ANBCTR (SUTURE) ×2 IMPLANT
SUT VIC AB 2-0 CTX 36 (SUTURE) ×4 IMPLANT
SUT VIC AB 3-0 MH 27 (SUTURE) ×8 IMPLANT
SUT VIC AB 3-0 X1 27 (SUTURE) ×12 IMPLANT
SUT VICRYL 0 TIES 12 18 (SUTURE) ×4 IMPLANT
SUT VICRYL 0 UR6 27IN ABS (SUTURE) ×8 IMPLANT
SUT VICRYL 2 TP 1 (SUTURE) IMPLANT
SYR 20ML ECCENTRIC (SYRINGE) ×8 IMPLANT
SYR 30ML LL (SYRINGE) ×4 IMPLANT
SYR 5ML LL (SYRINGE) ×4 IMPLANT
SYR 5ML LUER SLIP (SYRINGE) ×4 IMPLANT
SYSTEM RETRIEVAL ANCHOR 15 (MISCELLANEOUS) ×4 IMPLANT
SYSTEM SAHARA CHEST DRAIN ATS (WOUND CARE) ×4 IMPLANT
TAPE CLOTH 4X10 WHT NS (GAUZE/BANDAGES/DRESSINGS) ×4 IMPLANT
TAPE CLOTH SURG 6X10 WHT LF (GAUZE/BANDAGES/DRESSINGS) ×4 IMPLANT
TIP APPLICATOR SPRAY EXTEND 16 (VASCULAR PRODUCTS) IMPLANT
TOWEL GREEN STERILE (TOWEL DISPOSABLE) ×4 IMPLANT
TOWEL GREEN STERILE FF (TOWEL DISPOSABLE) ×4 IMPLANT
TRAP SPECIMEN MUCUS 40CC (MISCELLANEOUS) ×4 IMPLANT
TRAY FOLEY MTR SLVR 16FR STAT (SET/KITS/TRAYS/PACK) ×4 IMPLANT
TROCAR BLADELESS 15MM (ENDOMECHANICALS) IMPLANT
TROCAR XCEL 12X100 BLDLESS (ENDOMECHANICALS) ×4 IMPLANT
TUBE CONNECTING 20'X1/4 (TUBING) ×1
TUBE CONNECTING 20X1/4 (TUBING) ×3 IMPLANT
VALVE BIOPSY  SINGLE USE (MISCELLANEOUS) ×2
VALVE BIOPSY SINGLE USE (MISCELLANEOUS) ×2 IMPLANT
VALVE SUCTION BRONCHIO DISP (MISCELLANEOUS) ×4 IMPLANT
WATER STERILE IRR 1000ML POUR (IV SOLUTION) ×4 IMPLANT

## 2019-12-30 NOTE — Interval H&P Note (Signed)
History and Physical Interval Note:  12/30/2019 7:23 AM  Janice Adams  has presented today for surgery, with the diagnosis of ADENOCARCINOMA.  The various methods of treatment have been discussed with the patient and family. After consideration of risks, benefits and other options for treatment, the patient has consented to  Procedure(s): XI ROBOTIC ASSISTED THORASCOPY-BILOBECTOMY (Right) VIDEO BRONCHOSCOPY (N/A) as a surgical intervention.  The patient's history has been reviewed, patient examined, no change in status, stable for surgery.  I have reviewed the patient's chart and labs.  Questions were answered to the patient's satisfaction.     Melrose Nakayama

## 2019-12-30 NOTE — Transfer of Care (Signed)
Immediate Anesthesia Transfer of Care Note  Patient: Sakeena Teall  Procedure(s) Performed: XI ROBOTIC ASSISTED THORASCOPY - RIGHT LOWER AND MIDDLE LOBECTOMY (Right Chest) VIDEO BRONCHOSCOPY (N/A Chest) INTERCOSTAL NERVE BLOCK (Right Chest) LYMPH NODE DISSECTION (Right Chest)  Patient Location: PACU  Anesthesia Type:General  Level of Consciousness: awake, alert  and responds to stimulation  Airway & Oxygen Therapy: Patient Spontanous Breathing  Post-op Assessment: Report given to RN and Post -op Vital signs reviewed and stable  Post vital signs: Reviewed and stable  Last Vitals:  Vitals Value Taken Time  BP 174/93 12/30/19 1205  Temp    Pulse 76 12/30/19 1207  Resp 21 12/30/19 1207  SpO2 93 % 12/30/19 1207  Vitals shown include unvalidated device data.  Last Pain:  Vitals:   12/30/19 0618  TempSrc:   PainSc: 0-No pain      Patients Stated Pain Goal: 3 (56/72/09 1980)  Complications: No complications documented.

## 2019-12-30 NOTE — Brief Op Note (Addendum)
       [address]       [place] [address]             [phone]      12/30/2019  11:59 AM  PATIENT:  Janice Adams  64 y.o. female  PRE-OPERATIVE DIAGNOSIS:  STAGE IIB(T3N0) ADENOCARCINOMA of LUNG  POST-OPERATIVE DIAGNOSIS:  STAGE IIB(T3N0) ADENOCARCINOMA of LUNG  PROCEDURE:  Procedure(s): XI ROBOTIC ASSISTED THORASCOPY - RIGHT LOWER AND MIDDLE LOBECTOMY (Right) VIDEO BRONCHOSCOPY (N/A) INTERCOSTAL NERVE BLOCK (Right) LYMPH NODE DISSECTION (Right)  SURGEON:  Surgeon(s) and Role:    * Melrose Nakayama, MD - Primary  PHYSICIAN ASSISTANT:   Nicholes Rough, PA-C   ANESTHESIA:   general  EBL:  200 mL   BLOOD ADMINISTERED:none  DRAINS: ROUTINE, ONE BLAKE DRAIN   LOCAL MEDICATIONS USED:  BUPIVICAINE   SPECIMEN:  Source of Specimen:  RIGHT LOWER AND MIDDLE LOBE  DISPOSITION OF SPECIMEN:  PATHOLOGY  COUNTS:  YES  DICTATION: .Dragon Dictation  PLAN OF CARE: Admit to inpatient   PATIENT DISPOSITION:  PACU - hemodynamically stable.   Delay start of Pharmacological VTE agent (>24hrs) due to surgical blood loss or risk of bleeding: no

## 2019-12-30 NOTE — Anesthesia Procedure Notes (Signed)
Procedure Name: Intubation Date/Time: 12/30/2019 7:41 AM Performed by: Janace Litten, CRNA Pre-anesthesia Checklist: Patient identified, Emergency Drugs available, Suction available and Patient being monitored Patient Re-evaluated:Patient Re-evaluated prior to induction Oxygen Delivery Method: Circle System Utilized Preoxygenation: Pre-oxygenation with 100% oxygen Induction Type: IV induction Ventilation: Mask ventilation without difficulty Laryngoscope Size: Mac and 3 Grade View: Grade I Tube type: Oral Tube size: 8.5 mm Number of attempts: 1 Airway Equipment and Method: Stylet and Oral airway Placement Confirmation: ETT inserted through vocal cords under direct vision,  positive ETCO2 and breath sounds checked- equal and bilateral Secured at: 21 cm Tube secured with: Tape Dental Injury: Teeth and Oropharynx as per pre-operative assessment

## 2019-12-30 NOTE — Anesthesia Procedure Notes (Signed)
Procedure Name: Intubation Date/Time: 12/30/2019 8:12 AM Performed by: Janace Litten, CRNA Pre-anesthesia Checklist: Patient identified, Emergency Drugs available, Suction available and Patient being monitored Patient Re-evaluated:Patient Re-evaluated prior to induction Oxygen Delivery Method: Circle System Utilized Preoxygenation: Pre-oxygenation with 100% oxygen Induction Type: IV induction Ventilation: Mask ventilation without difficulty Laryngoscope Size: Mac and 3 Grade View: Grade I Tube type: Oral Endobronchial tube: Left, Double lumen EBT, EBT position confirmed by fiberoptic bronchoscope and EBT position confirmed by auscultation and 35 Fr Number of attempts: 1 Airway Equipment and Method: Stylet and Oral airway Placement Confirmation: ETT inserted through vocal cords under direct vision,  positive ETCO2 and breath sounds checked- equal and bilateral Tube secured with: Tape Dental Injury: Teeth and Oropharynx as per pre-operative assessment

## 2019-12-30 NOTE — Anesthesia Preprocedure Evaluation (Signed)
Anesthesia Evaluation  Patient identified by MRN, date of birth, ID band Patient awake    Reviewed: Allergy & Precautions, NPO status , Patient's Chart, lab work & pertinent test results  History of Anesthesia Complications Negative for: history of anesthetic complications  Airway Mallampati: II  TM Distance: >3 FB Neck ROM: Full    Dental  (+) Edentulous Lower, Edentulous Upper, Upper Dentures, Lower Dentures   Pulmonary neg recent URI, former smoker,  ADENOCARCINOMA   breath sounds clear to auscultation       Cardiovascular hypertension,  Rhythm:Regular     Neuro/Psych negative neurological ROS  negative psych ROS   GI/Hepatic Neg liver ROS, GERD  Medicated and Controlled,  Endo/Other  negative endocrine ROS  Renal/GU negative Renal ROS     Musculoskeletal negative musculoskeletal ROS (+)   Abdominal   Peds  Hematology negative hematology ROS (+)   Anesthesia Other Findings   Reproductive/Obstetrics                             Anesthesia Physical Anesthesia Plan  ASA: II  Anesthesia Plan: General   Post-op Pain Management:    Induction: Intravenous  PONV Risk Score and Plan: 3 and Ondansetron and Dexamethasone  Airway Management Planned: Oral ETT and Double Lumen EBT  Additional Equipment: Arterial line  Intra-op Plan:   Post-operative Plan: Extubation in OR  Informed Consent: I have reviewed the patients History and Physical, chart, labs and discussed the procedure including the risks, benefits and alternatives for the proposed anesthesia with the patient or authorized representative who has indicated his/her understanding and acceptance.     Dental advisory given  Plan Discussed with: CRNA and Surgeon  Anesthesia Plan Comments:         Anesthesia Quick Evaluation

## 2019-12-30 NOTE — Anesthesia Procedure Notes (Signed)
Arterial Line Insertion Start/End8/23/2021 7:10 AM Performed by: Imagene Riches, CRNA, CRNA  Patient location: Pre-op. Preanesthetic checklist: patient identified, IV checked, risks and benefits discussed, surgical consent and monitors and equipment checked Lidocaine 1% used for infiltration Left, radial was placed Catheter size: 20 G Hand hygiene performed  and maximum sterile barriers used   Attempts: 1 Procedure performed without using ultrasound guided technique. Following insertion, dressing applied and Biopatch. Post procedure assessment: normal  Patient tolerated the procedure well with no immediate complications.

## 2019-12-30 NOTE — Op Note (Signed)
NAME: Janice Adams, Janice Adams MEDICAL RECORD [id number] ACCOUNT [id number] DATE OF BIRTH:01/23/1956 FACILITY: [facility] LOCATION: [facility] PHYSICIAN:Mikaiya Tramble Chaya Jan, MD  OPERATIVE REPORT  DATE OF PROCEDURE:  12/30/2019  PREOPERATIVE DIAGNOSIS:  Stage IIB adenocarcinoma of right lung.  POSTOPERATIVE DIAGNOSIS:  Stage IIB adenocarcinoma of right lung.  PROCEDURES:   1.  Bronchoscopy.   2.  Xi robotic video-assisted right thoracoscopy.  3.  Right lower and middle lobectomy.  4.  Lymph node dissection.  5.  Intercostal nerve blocks levels 3 through 10.  SURGEON:  Modesto Charon, MD  ASSISTANT:  Nicholes Rough, PA  ANESTHESIA:  General.  FINDINGS:  Bronchoscopy unremarkable.  Multiple enlarged, but otherwise benign appearing lymph nodes.  Bronchial margin negative for tumor.  CLINICAL NOTE:  Janice Adams is a 64 year old woman with a history of tobacco abuse, who presented with shortness of breath, cough and chest tightness.  She was found to have a 5 cm infrahilar mass with extension along the bronchus intermedius.  She also  had some mild hypermetabolism in level 4R and 7 lymph nodes.  Bronchoscopy showed adenocarcinoma.  The lymph node aspirations were negative.  She was treated with neoadjuvant chemo and radiation, which she completed in June and now returns to discuss  possible resection.  The indications, risks, benefits and alternatives were discussed in detail with the patient.  She understood and accepted the risks and agreed to proceed.  OPERATIVE NOTE:  Janice Adams was brought to the preoperative holding area on 12/30/2019.  Anesthesia placed a central venous catheter and an arterial blood pressure monitoring line.  She was taken to the operating room, anesthetized and intubated with a  single lumen endotracheal tube.  Intravenous antibiotics were administered.  A Foley catheter was placed.  Sequential compression devices were placed on the calves for DVT prophylaxis.  A  timeout was performed.  Flexible fiberoptic bronchoscopy was performed.  There were no endobronchial lesions to the level of the subsegmental bronchi.  The patient was reintubated with a double lumen endotracheal tube.  She was placed in a left lateral decubitus position and the right chest was prepped and draped in the usual sterile fashion.  Single-lung ventilation of the left lung was initiated and  was tolerated well throughout the procedure.  Active warming was in place.  Another timeout was performed.  A solution containing 20 mL of liposomal bupivacaine, 30 mL of 0.5% bupivacaine and 50 mL of saline was prepared.  This was used for injection at the incision sites for local anesthesia, as well as for the intercostal  nerve blocks.  An incision was made in the 8th interspace in the mid axillary line.  An 8 mm port was inserted.  Thoracoscope was advanced into the chest.  There was good isolation of the right lung.  Carbon dioxide was insufflated per protocol.  Twelve  mm ports were placed 5 cm anterior and posterior to the camera port and then an additional 8 mm port was placed further posteriorly for retraction.  The 12 mm assistant's port was placed in the 10th interspace centered between the 2 anterior ports.   The  robot was deployed, the camera arm was attached, targeting was performed.  The remaining ports were docked to the robot.  The instruments were inserted with thoracoscopic visualization.  There was some bleeding with placement of the assistant port.  It was removed.  There was arterial bleeding, which required extending the incision to gain control.  This bleeding was ultimately controlled with electrocautery and  the assistant port was replaced.   The lung was retracted superiorly and the inferior pulmonary ligament was divided with bipolar cautery.  All lymph nodes that were encountered during the dissection were removed and sent as separate specimens for permanent pathology.  None of  the nodes  appeared to be involved with malignancy.  The pleural reflection was divided at the hilum posteriorly and level 7 and 11 nodes were removed.  Further superiorly, level 10 nodes were removed and the pleura was divided superior to the azygos vein and the  4R nodes were removed, along with the fatty tissue surrounding them.  Anteriorly, there were some adhesions in the pericardial fat pad, which were taken down with bipolar cautery.  There also was some foreshortening at the hilum and dissection was  primarily blunt dissection in that area to avoid any cautery utilization in the vicinity of the phrenic nerve.  The tumor mass was visible in the middle lobe.  The pleura was dissected off the basilar segmental branch to the lower lobe and the major  fissure was completed between the upper lobe and the superior segment with sequential firings of the robotic stapler.  The minor fissure then was completed, again using the robotic stapler with blue staple cartridges.  At this point, the mass did appear  to be resectable.  The middle lobe vein and inferior pulmonary vein were divided separately using a vascular cartridge with the stapler.  The main pulmonary artery was divided just distal to the takeoff of the posterior ascending branch to the upper  lobe.  The robotic stapler with a green cartridge was placed across the bronchus intermedius near the takeoff of the right upper lobe bronchus and closed.  A test inflation showed good aeration of the upper lobe.  A stapler was fired, transecting the  bronchus.  The specimen was placed into a large endoscopic retrieval bag.  While doing this, there was a small tear of the pleura in the apex of the upper lobe.  This was stapled with the robotic stapler.  The specimen then was brought down inferiorly in  the chest.  The chest was copiously irrigated with saline.  A test inflation to a pressure of 30 cm of water revealed no leakage from the bronchial stump or the  staple lines.  The robotic arms were undocked.  The specimen was removed through the 10th interspace  incision, which was enlarged slightly.  It was then sent for frozen section of the bronchial margin, which returned with no tumor seen.  The chest was again copiously irrigated with warm saline.  Inspection was made and counts were correct for all  sponges and the vessel loop that had been placed during the procedure.  A 28-French Blake drain was placed through the original port incision and secured with #1 silk suture.  Dual-lung ventilation was resumed.  The remaining incisions were closed in  standard fashion.  A 3-layer closure was used for the 10th interspace incision that had been enlarged.  It was noted that the patient had a lipoma just posterior to this incision.  The chest tube was placed to a Pleur-evac on waterseal.  The patient was  placed back in a supine position.  She was extubated in the operating room and taken to the [facility] in good condition.  VN/NUANCE  D:12/30/2019 T:12/30/2019 JOB:[id number]

## 2019-12-31 ENCOUNTER — Encounter (HOSPITAL_COMMUNITY): Payer: Self-pay | Admitting: Thoracic Surgery (Cardiothoracic Vascular Surgery)

## 2019-12-31 ENCOUNTER — Inpatient Hospital Stay (HOSPITAL_COMMUNITY): Payer: Medicaid Other

## 2019-12-31 LAB — GLUCOSE, CAPILLARY
Glucose-Capillary: 108 mg/dL — ABNORMAL HIGH (ref 70–99)
Glucose-Capillary: 89 mg/dL (ref 70–99)
Glucose-Capillary: 87 mg/dL (ref 70–99)
Glucose-Capillary: 82 mg/dL (ref 70–99)

## 2019-12-31 LAB — BASIC METABOLIC PANEL
Anion gap: 14 (ref 5–15)
BUN: 12 mg/dL (ref 8–23)
CO2: 22 mmol/L (ref 22–32)
Calcium: 9.1 mg/dL (ref 8.9–10.3)
Chloride: 102 mmol/L (ref 98–111)
Creatinine, Ser: 1.02 mg/dL — ABNORMAL HIGH (ref 0.44–1.00)
GFR calc Af Amer: >60 mL/min (ref >60)
GFR calc non Af Amer: 58 mL/min — ABNORMAL LOW (ref >60)
Glucose, Bld: 92 mg/dL (ref 70–99)
Potassium: 4.7 mmol/L (ref 3.5–5.1)
Sodium: 138 mmol/L (ref 135–145)

## 2019-12-31 LAB — CBC
HCT: 35 % — ABNORMAL LOW (ref 36.0–46.0)
Hemoglobin: 11.3 g/dL — ABNORMAL LOW (ref 12.0–15.0)
MCH: 32.7 pg (ref 26.0–34.0)
MCHC: 32.3 g/dL (ref 30.0–36.0)
MCV: 101.2 fL — ABNORMAL HIGH (ref 80.0–100.0)
Platelets: 249 10*3/uL (ref 150–400)
RBC: 3.46 MIL/uL — ABNORMAL LOW (ref 3.87–5.11)
RDW: 12.5 % (ref 11.5–15.5)
WBC: 6.9 10*3/uL (ref 4.0–10.5)
nRBC: 0 % (ref 0.0–0.2)

## 2019-12-31 IMAGING — DX DG CHEST 1V
1 series · 1 of 1 positions shown · non-contrast
Comparison: Portable exam at [GW] hrs compared to [DATE]

CLINICAL DATA: Post RIGHT lower and RIGHT middle lobe lobectomy,
chest tube

EXAM:
CHEST  1 VIEW

[chest ap]
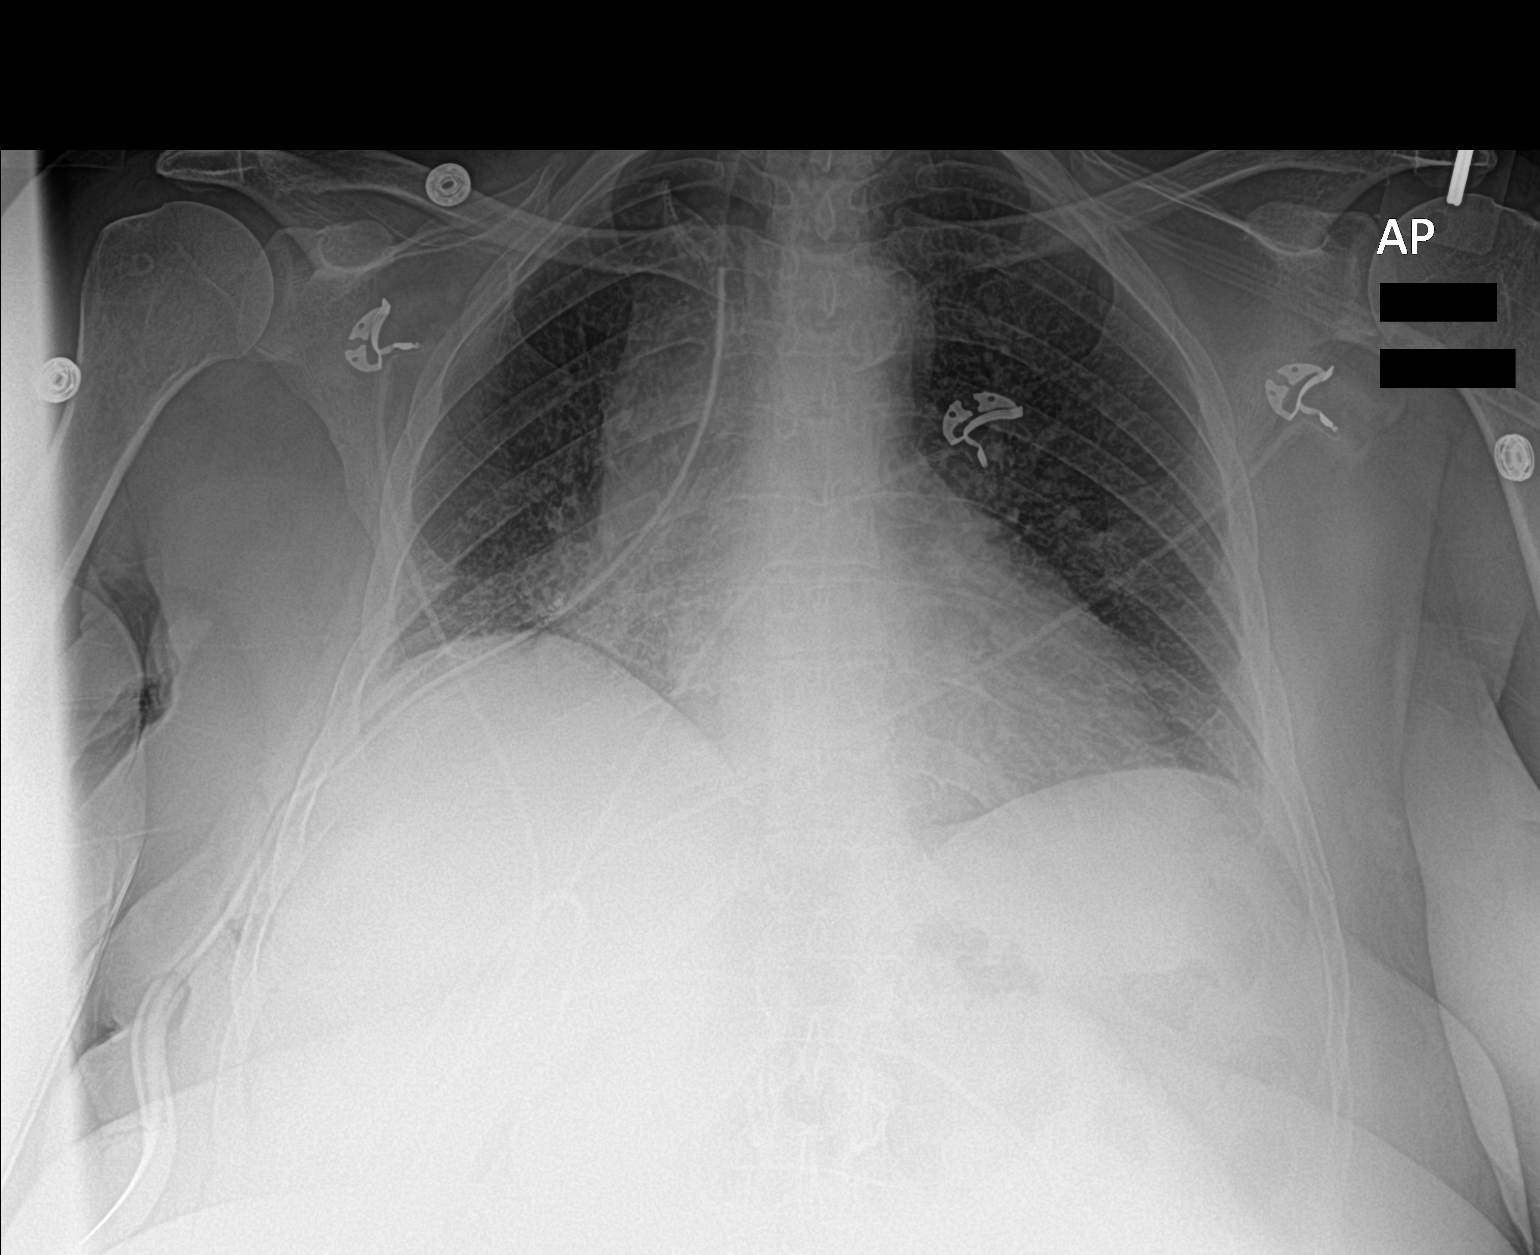

[1 of 1 positions shown; findings below may reference images not displayed]

FINDINGS: RIGHT thoracostomy tube unchanged.

Persistent RIGHT apex pneumothorax unchanged.

Enlargement of cardiac silhouette.

Atherosclerotic calcification aorta.

Minimal bibasilar atelectasis greater on RIGHT.

LEFT lung clear.

Osseous structures unremarkable.
IMPRESSION: Stable RIGHT thoracostomy tube with persistent RIGHT apex
pneumothorax and minimal bibasilar atelectasis.

## 2019-12-31 MED ORDER — GUAIFENESIN ER 600 MG PO TB12
600.0000 mg | ORAL_TABLET | Freq: Two times a day (BID) | ORAL | Status: AC
  Administered 2019-12-31 – 2020-01-02 (×5): 600 mg via ORAL
  Filled 2019-12-31 (×5): qty 1

## 2019-12-31 MED ORDER — CHLORHEXIDINE GLUCONATE CLOTH 2 % EX PADS
6.0000 | MEDICATED_PAD | Freq: Every day | CUTANEOUS | Status: DC
  Administered 2019-12-31 – 2020-01-01 (×2): 6 via TOPICAL

## 2019-12-31 NOTE — Progress Notes (Addendum)
      [address]       [place] [address]             [phone]      1 Day Post-Op Procedure(s) (LRB): XI ROBOTIC ASSISTED THORASCOPY - RIGHT LOWER AND MIDDLE LOBECTOMY (Right) VIDEO BRONCHOSCOPY (N/A) INTERCOSTAL NERVE BLOCK (Right) LYMPH NODE DISSECTION (Right) Subjective: Feels good this morning. She is "a little sore"  Objective: Vital signs in last 24 hours: Temp:  [97.5 F (36.4 C)-98.5 F (36.9 C)] 97.5 F (36.4 C) (08/24 0031) Pulse Rate:  [62-84] 62 (08/24 0031) Cardiac Rhythm: Normal sinus rhythm (08/23 1921) Resp:  [11-24] 19 (08/24 0031) BP: (130-152)/(75-90) 140/84 (08/24 0031) SpO2:  [93 %-100 %] 100 % (08/24 0031) Arterial Line BP: (183-217)/(70-98) 217/98 (08/23 1250) Weight:  [86.1 kg] 86.1 kg (08/23 1357)     Intake/Output from previous day: 08/23 0701 - 08/24 0700 In: 3157 [I.V.:2907; IV Piggyback:250] Out: 9977 [Urine:1300; Blood:200; Chest Tube:165] Intake/Output this shift: No intake/output data recorded.  General appearance: alert, cooperative and no distress Heart: regular rate and rhythm, S1, S2 normal, no murmur, click, rub or gallop Lungs: clear to auscultation bilaterally Abdomen: soft, non-tender; bowel sounds normal; no masses,  no organomegaly Extremities: extremities normal, atraumatic, no cyanosis or edema Wound: clean and dry  Lab Results: Recent Labs    12/30/19 0630 12/31/19 0419  WBC 3.1* 6.9  HGB 12.6 11.3*  HCT 39.0 35.0*  PLT 315 249   BMET:  Recent Labs    12/30/19 0630 12/31/19 0419  NA 139 138  K 3.7 4.7  CL 106 102  CO2 25 22  GLUCOSE 95 92  BUN 14 12  CREATININE 1.02* 1.02*  CALCIUM 9.4 9.1    PT/INR:  Recent Labs    12/30/19 0630  LABPROT 11.9  INR 0.9   ABG    Component Value Date/Time   PHART 7.400 12/30/2019 0715   HCO3 25.0 12/30/2019 0715   TCO2 28 05/05/2011 0921   O2SAT 95.6 12/30/2019 0715   CBG (last 3)  Recent Labs    12/30/19 1524 12/30/19 2128  12/31/19 0628  GLUCAP 116* 104* 108*    Assessment/Plan: S/P Procedure(s) (LRB): XI ROBOTIC ASSISTED THORASCOPY - RIGHT LOWER AND MIDDLE LOBECTOMY (Right) VIDEO BRONCHOSCOPY (N/A) INTERCOSTAL NERVE BLOCK (Right) LYMPH NODE DISSECTION (Right)  1. Pulm- CXR is stable with good placement of right-sided chest tube. Chest tube put out 165cc since surgery.  2. CV-BP well controlled, NSR in the 60s.  3. Renal-creatinine 1.02, on Toradol, will continue to trend 4. H and H 11.3/35.0, stable 5. Blood glucose well controlled  Plan: Remove foley catheter. Ambulate in the halls today. Chest tube without air leak and might be able to be removed today. Currently on water seal. Pain is well controlled on her current regimen.    LOS: 1 day    Elgie Collard 12/31/2019 Patient seen and examined, agree with above CXR shows a small apical space She has a small air leak at time of my exam- keep CT in place today IS, Ambulate, enoxaparin  Remo Lipps C. Roxan Hockey, MD [facility] [phone]

## 2019-12-31 NOTE — Anesthesia Postprocedure Evaluation (Signed)
Anesthesia Post Note  Patient: Janice Adams  Procedure(s) Performed: XI ROBOTIC ASSISTED THORASCOPY - RIGHT LOWER AND MIDDLE LOBECTOMY (Right Chest) VIDEO BRONCHOSCOPY (N/A Chest) INTERCOSTAL NERVE BLOCK (Right Chest) LYMPH NODE DISSECTION (Right Chest)     Patient location during evaluation: PACU Anesthesia Type: General Level of consciousness: awake and alert Pain management: pain level controlled Vital Signs Assessment: post-procedure vital signs reviewed and stable Respiratory status: spontaneous breathing, nonlabored ventilation, respiratory function stable and patient connected to nasal cannula oxygen Cardiovascular status: stable and blood pressure returned to baseline Postop Assessment: no apparent nausea or vomiting Anesthetic complications: no   No complications documented.  Last Vitals:  Vitals:   12/30/19 2009 12/31/19 0031  BP: (!) 144/82 140/84  Pulse: 65 62  Resp: 11 19  Temp: 36.9 C (!) 36.4 C  SpO2: 100% 100%    Last Pain:  Vitals:   12/31/19 0031  TempSrc: Oral  PainSc:                  Shakera Ebrahimi

## 2019-12-31 NOTE — Discharge Instructions (Signed)

## 2019-12-31 NOTE — Discharge Summary (Addendum)
[address]       [place] [address]             [phone]      Physician Discharge Summary  Patient ID: Janice Adams MRN: [id number] DOB/AGE: [birth year] 65 y.o.  Admit date: 12/30/2019 Discharge date: 01/03/2020  Admission Diagnoses: Adenocarcinoma right middle lobe, pretreatment stage T3, N0, (IIB)  Patient Active Problem List   Diagnosis Date Noted  . Encounter for antineoplastic immunotherapy 11/04/2019  . UTI (urinary tract infection) 08/19/2019  . SVT (supraventricular tachycardia) ([place]) 08/19/2019  . Sepsis ([place]) 08/19/2019  . Encounter for antineoplastic chemotherapy 07/09/2019  . Goals of care, counseling/discussion 07/09/2019  . Primary malignant neoplasm of bronchus of right middle lobe ([place]) 06/12/2019  . Mass of right lung 05/30/2019  . GERD (gastroesophageal reflux disease) 11/08/2012  . Smoker 11/08/2012    Discharge Diagnoses: Adenocarcinoma right middle lobe, posttreatment pathologic stage ypT1b, N0, (1A) Active Problems:   S/P robot-assisted surgical procedure   Discharged Condition: good  HPI:   Janice Adams is a 64 year old woman with a history of tobacco abuse, hypertension, reflux, and back pain.  She presented in November with shortness of breath, cough, and chest tightness.  A chest x-ray showed a right infrahilar mass.  On CT of the chest she had a 5.3 x 3.6 x 3.2 cm infrahilar mass with extension along the bronchus intermedius.  On PET CT there was some hypermetabolism in the level 4R and 7 lymph nodes.  The mass was markedly hypermetabolic.  Bronchoscopy and biopsy showed adenocarcinoma.  Lymph node aspirations were negative.   She was treated with chemo and radiation which she completed in June and now returns to discuss possible resection.   She says that she was feeling fine all through chemo and radiation.  She really did not have any problems with nausea throughout that.  However after she received an infusion  last week, which I think was Imfinzi she has felt poorly.  Today she is complaining of abdominal pain and she had some nausea and vomiting earlier.  He says she is also been having rapid heart rate and is scheduled to have an echocardiogram tomorrow.  Hospital Course:   Janice Adams is a 64 year old female patient who underwent a robotic video-assisted right thoracoscopy, right lower and middle lobectomy, lymph node dissection, and intercostal nerve block with Dr. Roxan Hockey.  She tolerated the procedure well and was transferred to the stepdown unit for continued care.  Postop day 1 she had a chest tube in on waterseal.  She had a small air leak on exam, therefore we continued the chest tube.  Her pain was well controlled at this time.  She was placed on Lovenox for DVT prophylaxis.  She remained in normal sinus rhythm and blood pressure was stable. Chest tube was removed 01/02/2020 without issue. CXR on 01/03/2020 showed Postsurgical changes with volume loss in the right hemithorax. Right hydropneumothorax with stable small pneumothorax component. She was deemed appropriate for discharge. She was ambulating with limited assistance, tolerating room air, her incisions are healing well and she is ready for discharge home.   Consults: None  Significant Diagnostic Studies:  CLINICAL DATA:  Post RIGHT lower and RIGHT middle lobe lobectomy, chest tube   EXAM: CHEST  1 VIEW   COMPARISON:  Portable exam at 0524 hrs compared to 12/30/2019   FINDINGS: RIGHT thoracostomy tube unchanged.   Persistent RIGHT apex pneumothorax unchanged.   Enlargement of cardiac silhouette.  Atherosclerotic calcification aorta.   Minimal bibasilar atelectasis greater on RIGHT.   LEFT lung clear.   Osseous structures unremarkable.   IMPRESSION: Stable RIGHT thoracostomy tube with persistent RIGHT apex pneumothorax and minimal bibasilar atelectasis.     Electronically Signed   By: Lavonia Dana M.D.   On:  12/31/2019 08:01  Treatments:  NAME: SELEEN, WALTER MEDICAL RECORD [id number] ACCOUNT [id number] DATE OF BIRTH:09-11-1955 FACILITY: [facility] LOCATION: [facility] PHYSICIAN:Tayra Dawe Chaya Jan, MD   OPERATIVE REPORT   DATE OF PROCEDURE:  12/30/2019   PREOPERATIVE DIAGNOSIS:  Stage IIB adenocarcinoma of right lung.   POSTOPERATIVE DIAGNOSIS:  Stage IIB adenocarcinoma of right lung.   PROCEDURES:   1.  Bronchoscopy.   2.  Xi robotic video-assisted right thoracoscopy.  3.  Right lower and middle lobectomy.  4.  Lymph node dissection.  5.  Intercostal nerve blocks at levels 3 through 10.   SURGEON:  Modesto Charon, MD   ASSISTANT:  Nicholes Rough, PA   ANESTHESIA:  General.   FINDINGS:  Bronchoscopy unremarkable.  Multiple enlarged, but otherwise benign appearing lymph nodes.  Bronchial margin negative for tumor.    Discharge Exam: Blood pressure (!) 174/68, pulse 68, temperature 98.5 F (36.9 C), temperature source Oral, resp. rate 17, height 5\' 4"  (1.626 m), weight 86.1 kg, SpO2 100 %.   General appearance: alert, cooperative and no distress Heart: regular rate and rhythm, S1, S2 normal, no murmur, click, rub or gallop Lungs: clear to auscultation bilaterally Abdomen: soft, non-tender; bowel sounds normal; no masses,  no organomegaly Extremities: extremities normal, atraumatic, no cyanosis or edema Wound: clean and dry  Disposition: Discharge disposition: 01-Home or Self Care        Allergies as of 01/03/2020   No Known Allergies     Medication List    STOP taking these medications   naproxen sodium 220 MG tablet Commonly known as: ALEVE   prochlorperazine 10 MG tablet Commonly known as: COMPAZINE     TAKE these medications   acetaminophen 500 MG tablet Commonly known as: TYLENOL Take 2 tablets (1,000 mg total) by mouth every 6 (six) hours.   multivitamin with minerals Tabs tablet Take 1 tablet by mouth daily.   omeprazole 20 MG  capsule Commonly known as: PRILOSEC Take 1 capsule (20 mg total) by mouth 2 (two) times daily before a meal. To lower stomach acid   traMADol 50 MG tablet Commonly known as: ULTRAM Take 1 tablet (50 mg total) by mouth every 6 (six) hours as needed (mild pain).       Follow-up Information    Fulp, Cammie, MD. Call in 1 day(s).   Specialty: Family Medicine Contact information: [place] [address] [phone]        Melrose Nakayama, MD Follow up.   Specialty: Cardiothoracic Surgery Why: Your routine appointment is on 01/14/2020 at 10:45am. Please arrive at 10:15am for a chest xray located at [facility] which is on the first floor of our building Contact information: [place] [address] [phone]               Signed: Elgie Collard 01/03/2020, 2:59 PM

## 2020-01-01 ENCOUNTER — Inpatient Hospital Stay (HOSPITAL_COMMUNITY): Payer: Medicaid Other

## 2020-01-01 LAB — GLUCOSE, CAPILLARY
Glucose-Capillary: 82 mg/dL (ref 70–99)
Glucose-Capillary: 88 mg/dL (ref 70–99)
Glucose-Capillary: 86 mg/dL (ref 70–99)
Glucose-Capillary: 83 mg/dL (ref 70–99)

## 2020-01-01 LAB — CBC
HCT: 32.8 % — ABNORMAL LOW (ref 36.0–46.0)
Hemoglobin: 10.3 g/dL — ABNORMAL LOW (ref 12.0–15.0)
MCH: 31.3 pg (ref 26.0–34.0)
MCHC: 31.4 g/dL (ref 30.0–36.0)
MCV: 99.7 fL (ref 80.0–100.0)
Platelets: 277 10*3/uL (ref 150–400)
RBC: 3.29 MIL/uL — ABNORMAL LOW (ref 3.87–5.11)
RDW: 12.5 % (ref 11.5–15.5)
WBC: 4.8 10*3/uL (ref 4.0–10.5)
nRBC: 0 % (ref 0.0–0.2)

## 2020-01-01 LAB — COMPREHENSIVE METABOLIC PANEL
ALT: 14 U/L (ref 0–44)
AST: 20 U/L (ref 15–41)
Albumin: 2.9 g/dL — ABNORMAL LOW (ref 3.5–5.0)
Alkaline Phosphatase: 42 U/L (ref 38–126)
Anion gap: 9 (ref 5–15)
BUN: 8 mg/dL (ref 8–23)
CO2: 23 mmol/L (ref 22–32)
Calcium: 8.8 mg/dL — ABNORMAL LOW (ref 8.9–10.3)
Chloride: 103 mmol/L (ref 98–111)
Creatinine, Ser: 0.74 mg/dL (ref 0.44–1.00)
GFR calc Af Amer: >60 mL/min (ref >60)
GFR calc non Af Amer: >60 mL/min (ref >60)
Glucose, Bld: 87 mg/dL (ref 70–99)
Potassium: 3.8 mmol/L (ref 3.5–5.1)
Sodium: 135 mmol/L (ref 135–145)
Total Bilirubin: 0.8 mg/dL (ref 0.3–1.2)
Total Protein: 5.9 g/dL — ABNORMAL LOW (ref 6.5–8.1)

## 2020-01-01 IMAGING — DX DG CHEST 1V PORT
1 series · 1 of 1 positions shown · non-contrast
Comparison: [DATE]

CLINICAL DATA: Pneumothorax, chest tube.

EXAM:
PORTABLE CHEST 1 VIEW

[chest ap]
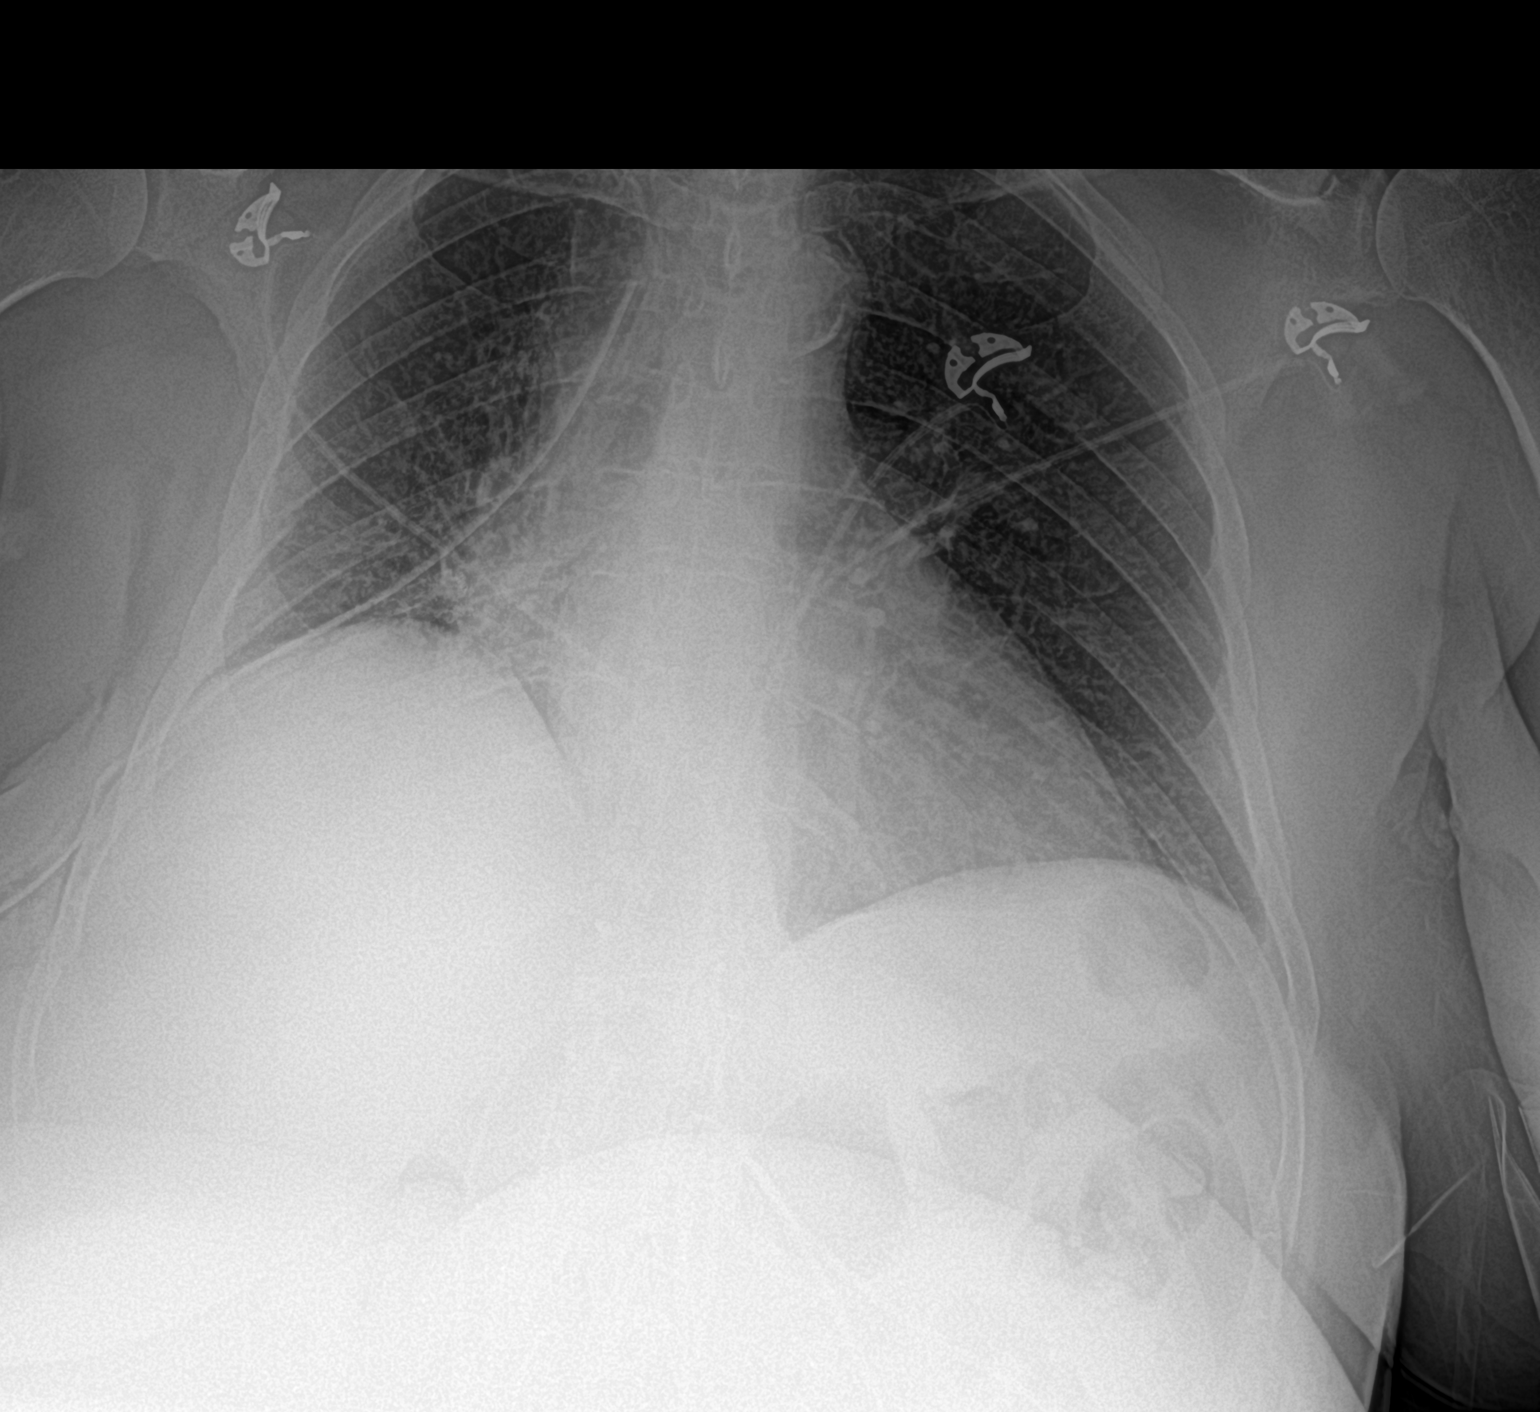

[1 of 1 positions shown; findings below may reference images not displayed]

FINDINGS: Similar positioning of the right thoracostomy tube tube. Similar
size of the right apical pneumothorax. Similar enlargement the
cardiopericardial silhouette. Aortic atherosclerosis. Similar
streaky right greater than left basilar opacities, compatible with
atelectasis. No consolidation.w No sizable pleural effusion. No
acute osseous abnormality.
IMPRESSION: No substantial change. Similar position of the right thoracostomy
tube with similar right apical pneumothorax and mild bibasilar
atelectasis.

## 2020-01-01 MED ORDER — LACTULOSE 10 GM/15ML PO SOLN
20.0000 g | Freq: Once | ORAL | Status: AC
  Administered 2020-01-01: 20 g via ORAL
  Filled 2020-01-01: qty 30

## 2020-01-01 MED ORDER — SIMETHICONE 80 MG PO CHEW: 80.0000 mg | CHEWABLE_TABLET | Freq: Four times a day (QID) | ORAL | Status: DC | PRN

## 2020-01-01 NOTE — Progress Notes (Addendum)
      [address]       [place] [address]             [phone]      2 Days Post-Op Procedure(s) (LRB): XI ROBOTIC ASSISTED THORASCOPY - RIGHT LOWER AND MIDDLE LOBECTOMY (Right) VIDEO BRONCHOSCOPY (N/A) INTERCOSTAL NERVE BLOCK (Right) LYMPH NODE DISSECTION (Right) Subjective: She is having some sharp abdominal pain this morning in her lower right quadrant. It started when she got up to use the bathroom.   Objective: Vital signs in last 24 hours: Temp:  [97.6 F (36.4 C)-98.7 F (37.1 C)] 97.6 F (36.4 C) (08/25 0500) Pulse Rate:  [65-78] 70 (08/25 0500) Cardiac Rhythm: Normal sinus rhythm (08/24 2100) Resp:  [12-17] 16 (08/25 0500) BP: (118-171)/(72-90) 144/90 (08/25 0500) SpO2:  [100 %] 100 % (08/25 0500)    Intake/Output from previous day: 08/24 0701 - 08/25 0700 In: 429.1 [I.V.:429.1] Out: 950 [Urine:650; Chest Tube:300] Intake/Output this shift: No intake/output data recorded.  General appearance: alert, cooperative and no distress Heart: regular rate and rhythm, S1, S2 normal, no murmur, click, rub or gallop Lungs: clear to auscultation bilaterally, rhonchi bilaterally Abdomen: soft, non-tender; bowel sounds normal; no masses,  no organomegaly Extremities: extremities normal, atraumatic, no cyanosis or edema Wound: clean and dry  Lab Results: Recent Labs    12/31/19 0419 01/01/20 0308  WBC 6.9 4.8  HGB 11.3* 10.3*  HCT 35.0* 32.8*  PLT 249 277   BMET:  Recent Labs    12/31/19 0419 01/01/20 0308  NA 138 135  K 4.7 3.8  CL 102 103  CO2 22 23  GLUCOSE 92 87  BUN 12 8  CREATININE 1.02* 0.74  CALCIUM 9.1 8.8*    PT/INR:  Recent Labs    12/30/19 0630  LABPROT 11.9  INR 0.9   ABG    Component Value Date/Time   PHART 7.400 12/30/2019 0715   HCO3 25.0 12/30/2019 0715   TCO2 28 05/05/2011 0921   O2SAT 95.6 12/30/2019 0715   CBG (last 3)  Recent Labs    12/31/19 1628 12/31/19 2102 01/01/20 0617  GLUCAP 87 82 82     Assessment/Plan: S/P Procedure(s) (LRB): XI ROBOTIC ASSISTED THORASCOPY - RIGHT LOWER AND MIDDLE LOBECTOMY (Right) VIDEO BRONCHOSCOPY (N/A) INTERCOSTAL NERVE BLOCK (Right) LYMPH NODE DISSECTION (Right)  1. Pulm- CXR is stable with good placement of right-sided chest tube. No visible pneumothorax. Chest tube put out 300cc in 24 hours 2. CV-BP well controlled, NSR in the 70s.  3. Renal-creatinine 0.74, on Toradol, will continue to trend 4. H and H 11.3/35.0, stable 5. Blood glucose well controlled  Plan: Intermittent air leak this morning with cough. Having some gas pain, I have ordered simethicone PRN. Encouraged to ambulate. Weaning oxygen as tolerated.    LOS: 2 days    Elgie Collard 01/01/2020 Still with a small intermittent air leak. Leave tube in place today Ambulate more Will give lactulose x 1 this AM  Remo Lipps C. Roxan Hockey, MD [facility] [phone]

## 2020-01-01 NOTE — Progress Notes (Signed)
Patient walked 454ft. End of walk patients HR went to 140s. Sat down and HR went to baseline. Will continue to monitor.   Era Bumpers, RN

## 2020-01-02 ENCOUNTER — Inpatient Hospital Stay (HOSPITAL_COMMUNITY): Payer: Medicaid Other

## 2020-01-02 LAB — GLUCOSE, CAPILLARY: Glucose-Capillary: 100 mg/dL — ABNORMAL HIGH (ref 70–99)

## 2020-01-02 IMAGING — DX DG CHEST 1V PORT
1 series · 1 of 1 positions shown · non-contrast
Comparison: [DATE]

CLINICAL DATA: Recent lobectomy on the right with postoperative
change. Chest tube in place.

EXAM:
PORTABLE CHEST 1 VIEW

[chest ap]
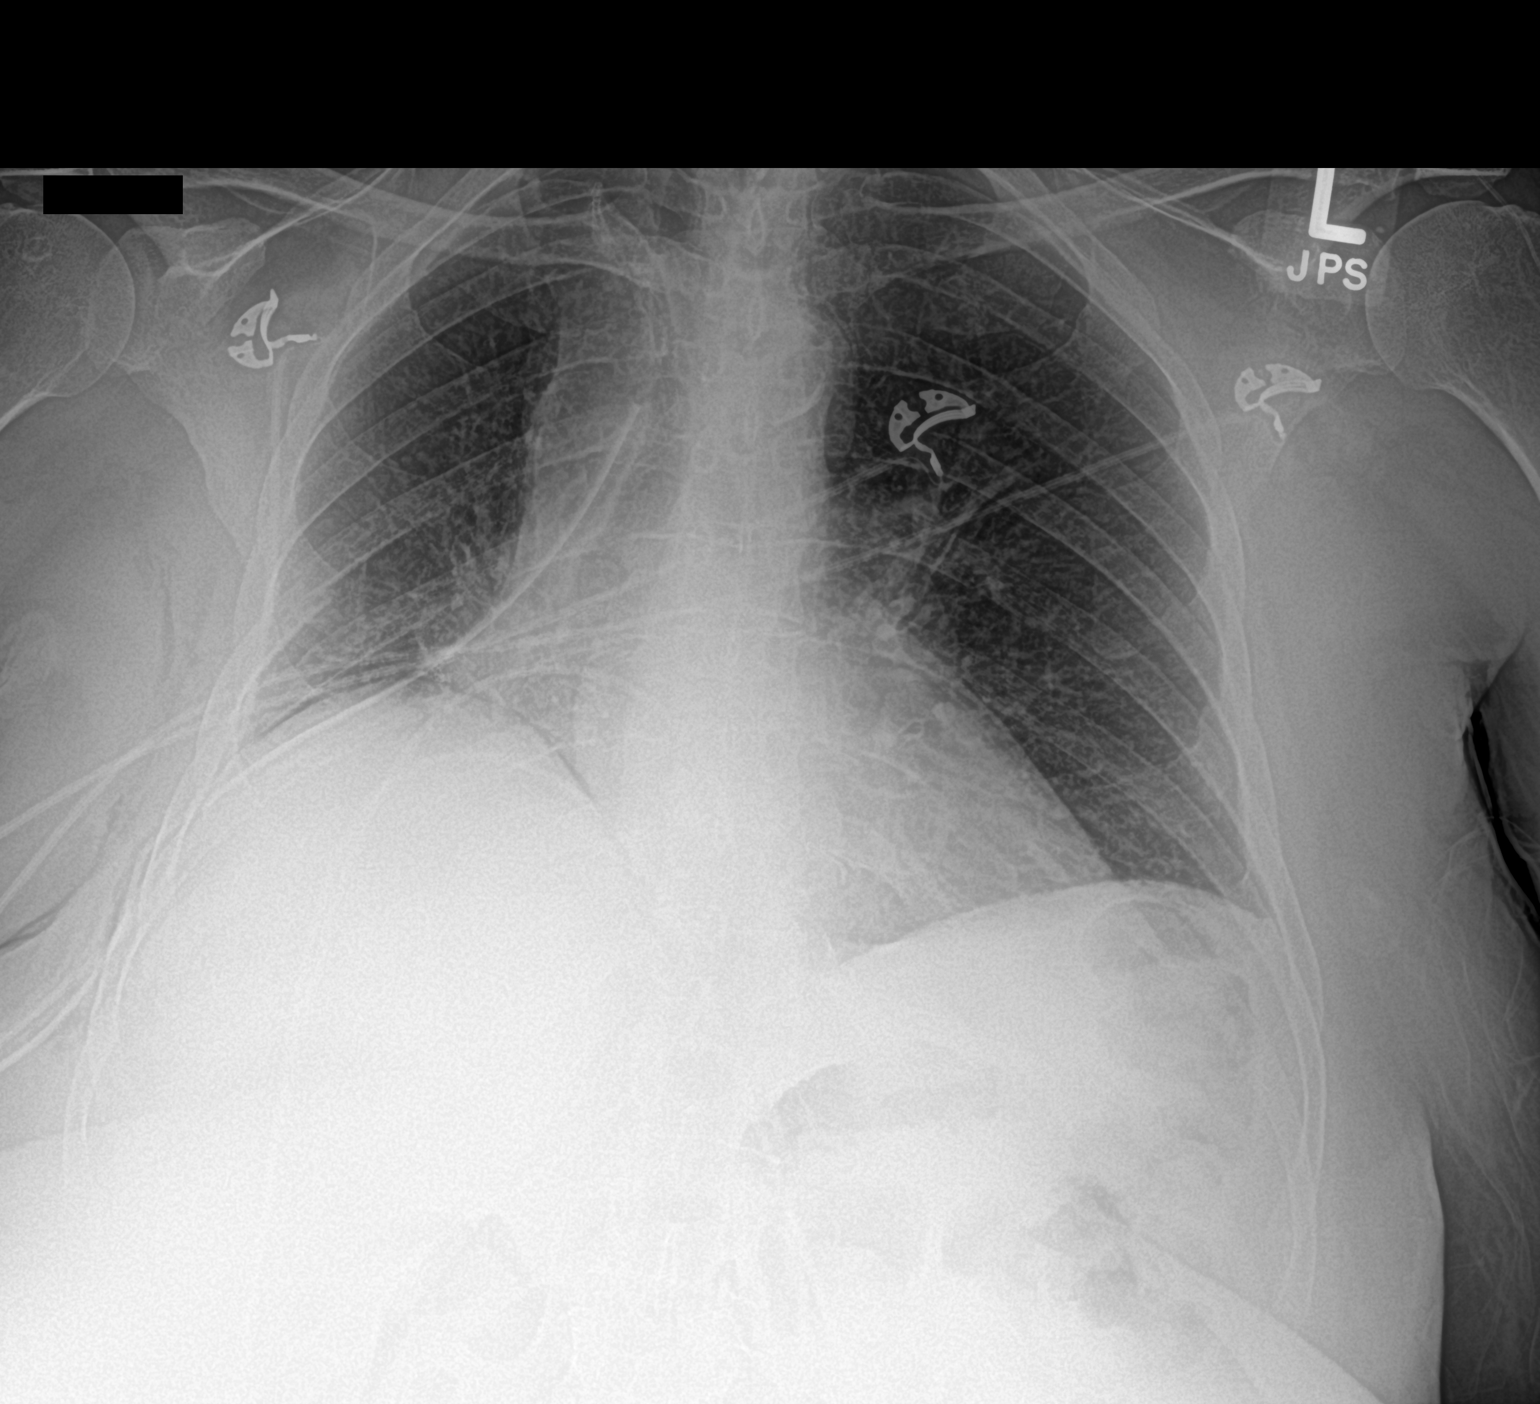

[1 of 1 positions shown; findings below may reference images not displayed]

FINDINGS: Stable chest tube positioning on the right. There is a small right
apical pneumothorax, stable. There is atelectatic change in the
right base. There is no edema or airspace opacity. Left lung is
clear. Heart is normal in size and contour. The pulmonary
vascularity within normal limits. No adenopathy. There is aortic
atherosclerosis. No bone lesions.
IMPRESSION: Stable chest tube positioning on the right with small right apical
pneumothorax. No tension component. Atelectasis right base with
volume loss on the right. Left lung clear. No new opacity evident.
Stable cardiac silhouette.

Aortic Atherosclerosis ([6G]-[6G]).

## 2020-01-02 IMAGING — DX DG CHEST 1V SAME DAY
1 series · 1 of 1 positions shown · non-contrast
Comparison: [DATE] study obtained earlier in the day

CLINICAL DATA: Chest tube removal

EXAM:
CHEST - 1 VIEW SAME DAY

[chest]
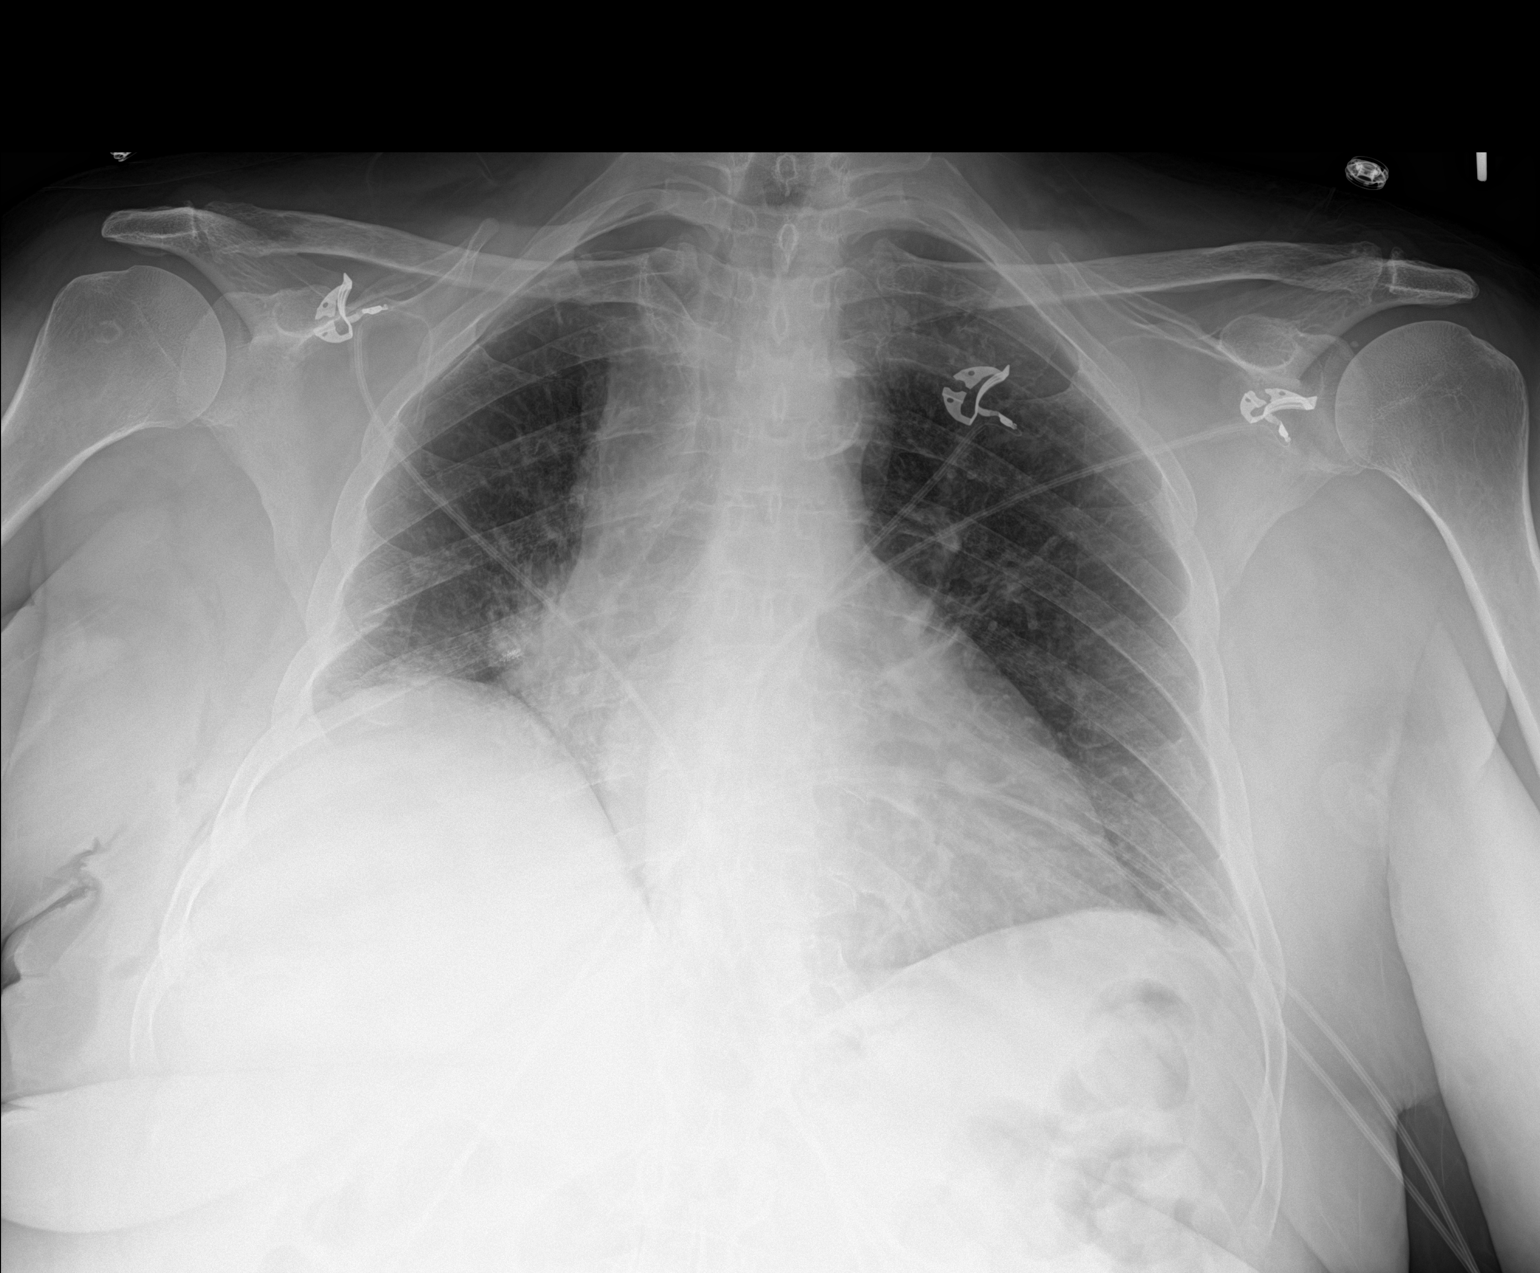

[1 of 1 positions shown; findings below may reference images not displayed]

FINDINGS: Chest tube on the right is been removed. Stable apical pneumothorax
on the right without tension component. There is volume loss on the
right with elevation the right hemidiaphragm and right base
atelectasis. Left lung clear. Heart size and contour within normal
limits. No adenopathy. There is aortic atherosclerosis. No bone
lesions.
IMPRESSION: Essentially stable pneumothorax on the right without tension
component following chest tube removal. Volume loss on the right,
stable. No new opacity. Stable cardiac silhouette.

Aortic Atherosclerosis ([U9]-[U9]).

## 2020-01-02 NOTE — Progress Notes (Addendum)
      [address]       [place] [address]             [phone]      3 Days Post-Op Procedure(s) (LRB): XI ROBOTIC ASSISTED THORASCOPY - RIGHT LOWER AND MIDDLE LOBECTOMY (Right) VIDEO BRONCHOSCOPY (N/A) INTERCOSTAL NERVE BLOCK (Right) LYMPH NODE DISSECTION (Right) Subjective: Feels okay this morning. Appetite is coming back.   Objective: Vital signs in last 24 hours: Temp:  [98.2 F (36.8 C)-98.9 F (37.2 C)] 98.6 F (37 C) (08/26 0000) Pulse Rate:  [80-98] 86 (08/26 0000) Cardiac Rhythm: Normal sinus rhythm (08/26 0751) Resp:  [13-18] 15 (08/26 0000) BP: (127-152)/(84-104) 152/104 (08/26 0000) SpO2:  [91 %-99 %] 99 % (08/26 0000)     Intake/Output from previous day: 08/25 0701 - 08/26 0700 In: 250 [P.O.:250] Out: 140 [Chest Tube:140] Intake/Output this shift: No intake/output data recorded.  General appearance: alert, cooperative and no distress Heart: regular rate and rhythm, S1, S2 normal, no murmur, click, rub or gallop Lungs: clear to auscultation bilaterally Abdomen: soft, non-tender; bowel sounds normal; no masses,  no organomegaly and hyperactive bowel sounds Extremities: extremities normal, atraumatic, no cyanosis or edema Wound: clean and dry  Lab Results: Recent Labs    12/31/19 0419 01/01/20 0308  WBC 6.9 4.8  HGB 11.3* 10.3*  HCT 35.0* 32.8*  PLT 249 277   BMET:  Recent Labs    12/31/19 0419 01/01/20 0308  NA 138 135  K 4.7 3.8  CL 102 103  CO2 22 23  GLUCOSE 92 87  BUN 12 8  CREATININE 1.02* 0.74  CALCIUM 9.1 8.8*    PT/INR: No results for input(s): LABPROT, INR in the last 72 hours. ABG    Component Value Date/Time   PHART 7.400 12/30/2019 0715   HCO3 25.0 12/30/2019 0715   TCO2 28 05/05/2011 0921   O2SAT 95.6 12/30/2019 0715   CBG (last 3)  Recent Labs    01/01/20 1118 01/01/20 1637 01/01/20 2121  GLUCAP 88 86 83    Assessment/Plan: S/P Procedure(s) (LRB): XI ROBOTIC ASSISTED THORASCOPY -  RIGHT LOWER AND MIDDLE LOBECTOMY (Right) VIDEO BRONCHOSCOPY (N/A) INTERCOSTAL NERVE BLOCK (Right) LYMPH NODE DISSECTION (Right)  1. Pulm- CXR is stable with good placement of right-sided chest tube. Small unchanged apical pneumo. Chest tube put out 140cc in 24 hours 2. CV-BP high at times, not on any agents at home, NSR in the 80s.  3. Renal-creatinine 0.74, on Toradol, will continue to trend 4. H and H 10.3/32.8, stable 5. Blood glucose well controlled 6. GI- appetite is coming back slowly  Plan: No air leak this morning on my exam, + fluid wave. She has been on water seal. CXR stable with small right apical pneumo. May be able to remove chest tube today. Continue to ambulate in the halls. Continue use of incentive spirometer.    LOS: 3 days    Elgie Collard 01/02/2020 Patient seen and examined, agree with above No air leak- dc chest tube CXR shows a small apical space Path pending Still has abdominal discomfort, but exam completely unremarkable, with no tenderness even with deep palpation, + BM yesterday  Remo Lipps C. Roxan Hockey, MD [facility] [phone]

## 2020-01-02 NOTE — Progress Notes (Signed)
Right chest tube removed per order.  Patient tolerated well. 4x4 gauze and tape applied. Radiology called for 1 view CXR. Pt resting with call bell within reach.  Will continue to monitor.

## 2020-01-03 ENCOUNTER — Inpatient Hospital Stay (HOSPITAL_COMMUNITY): Payer: Medicaid Other

## 2020-01-03 IMAGING — CR DG CHEST 2V
2 series · 2 of 2 positions shown · non-contrast
Comparison: [DATE]

CLINICAL DATA: Status post lobectomy

EXAM:
CHEST - 2 VIEW

[chest pa]
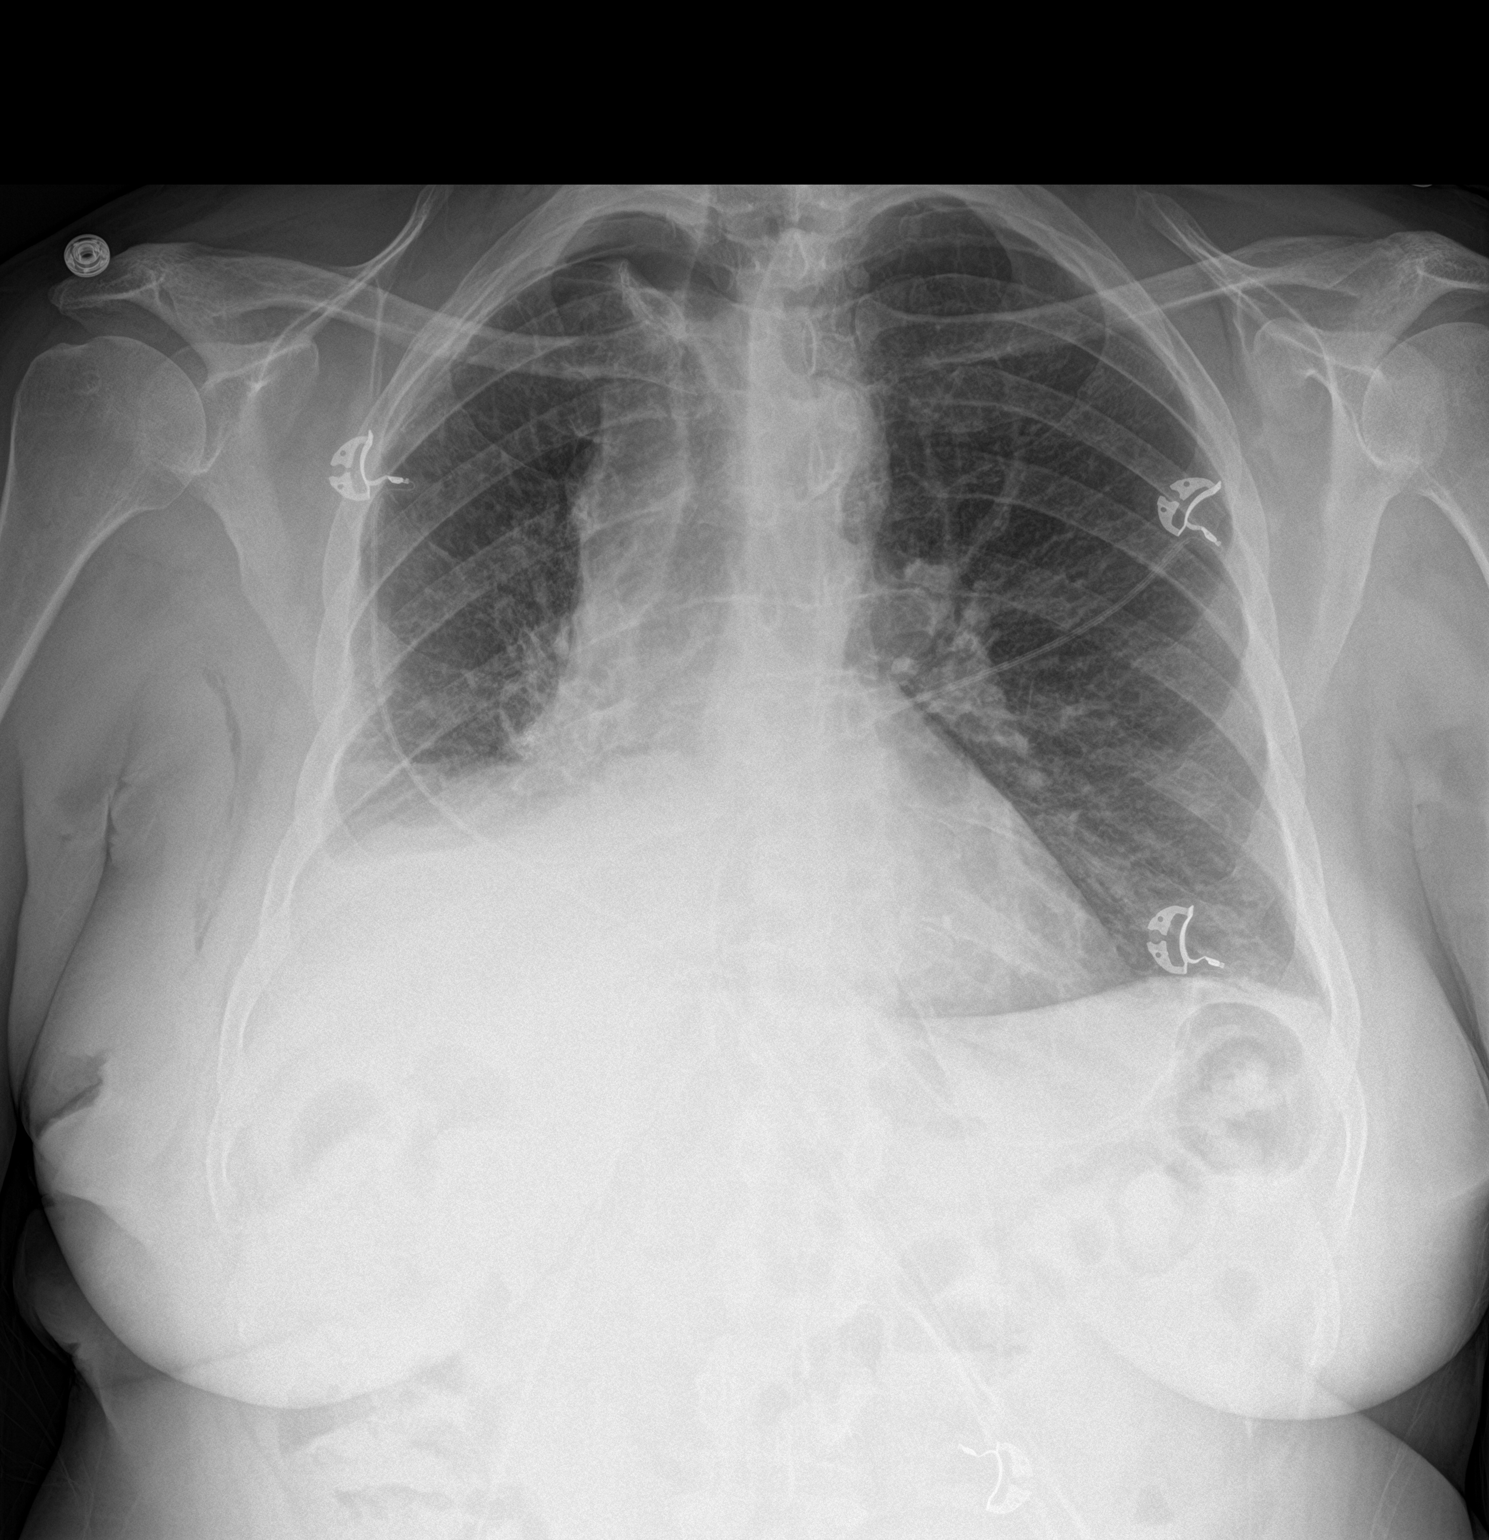

[chest lat]
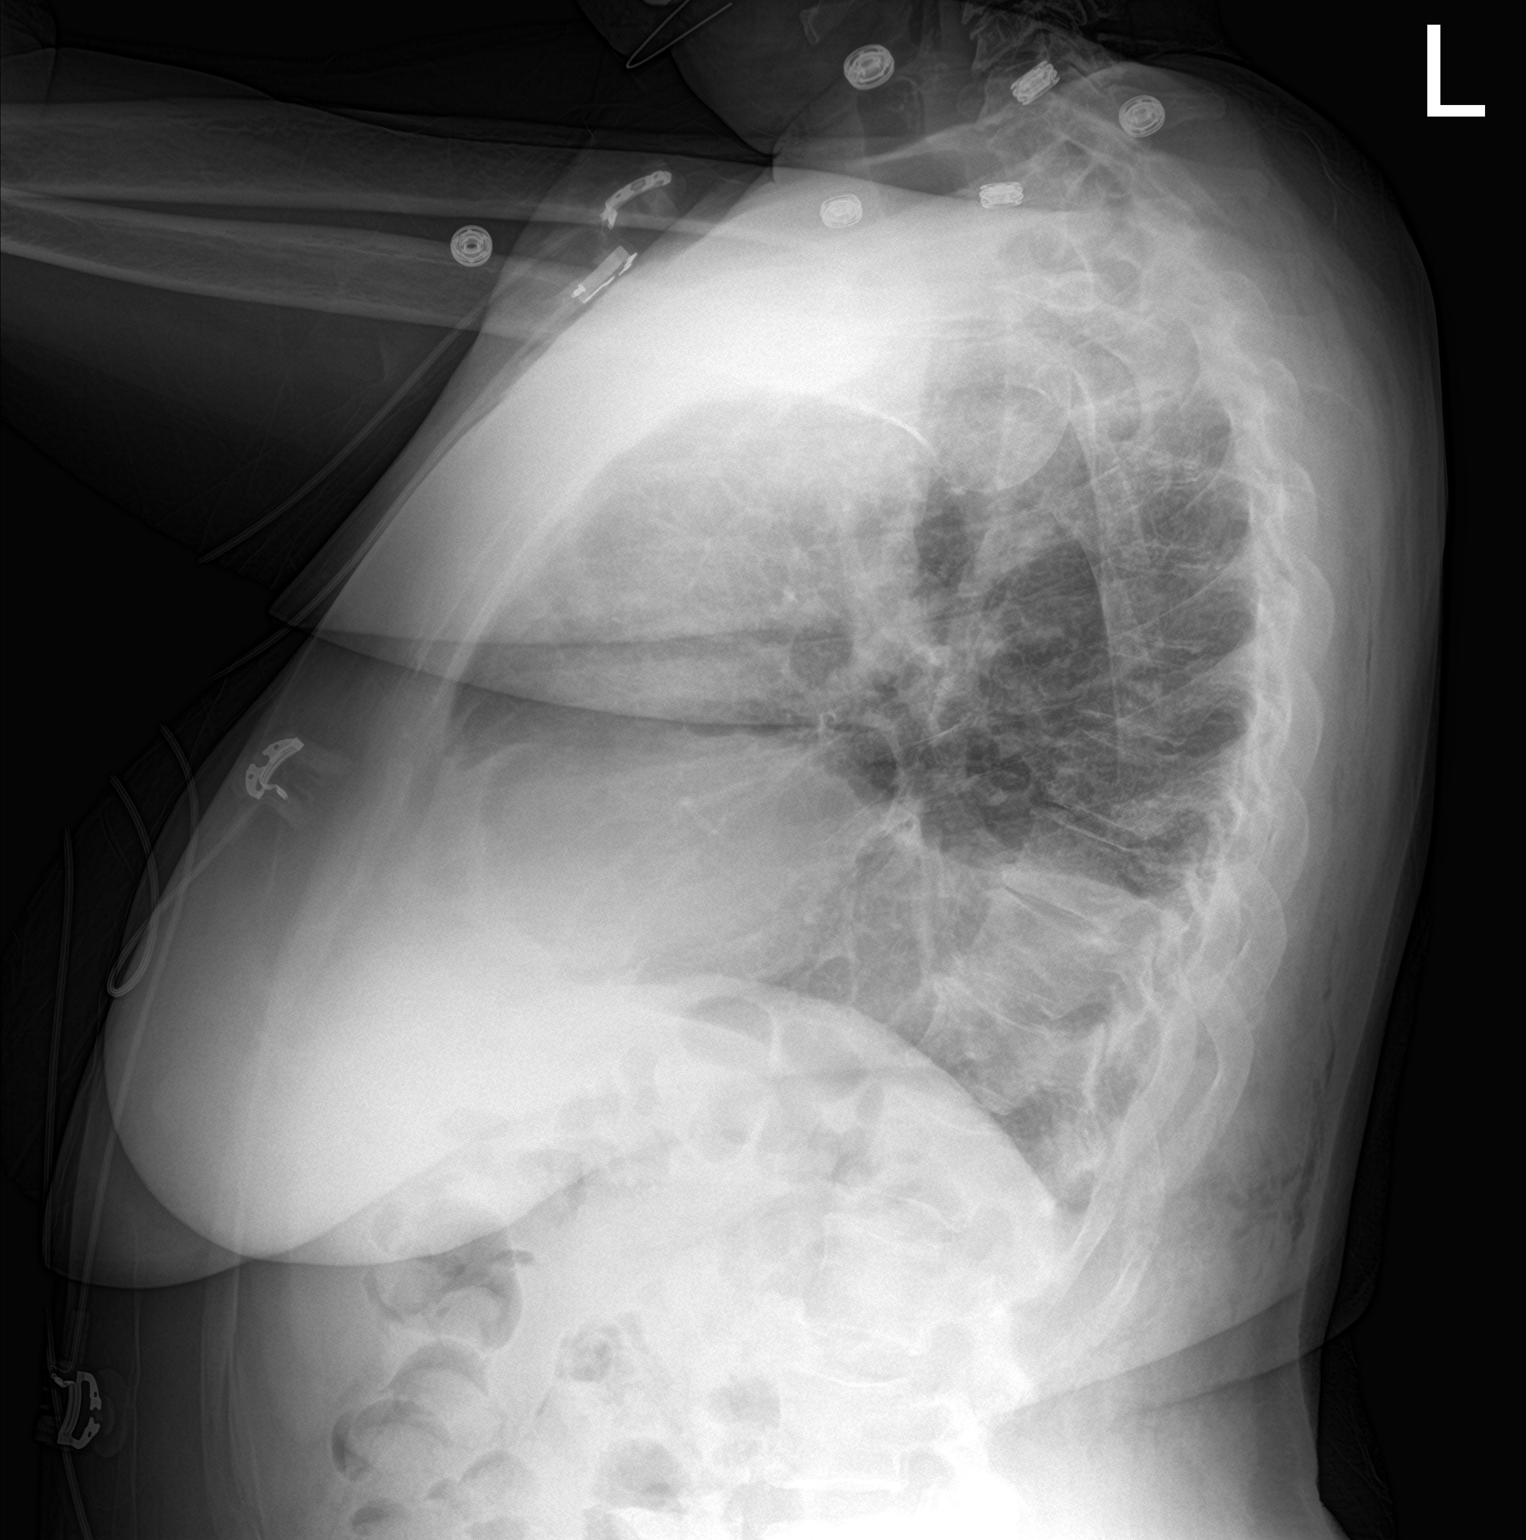

[2 of 2 positions shown; findings below may reference images not displayed]

FINDINGS: Postsurgical changes with volume loss in the right hemithorax.

Right hydropneumothorax with stable small pneumothorax component and
small right pleural effusion component, increased. Left lung is
essentially clear.

The heart is normal in size.
IMPRESSION: Postsurgical changes with volume loss in the right hemithorax.

Right hydropneumothorax with stable small pneumothorax component, as
above.

## 2020-01-03 MED ORDER — TRAMADOL HCL 50 MG PO TABS: 50.0000 mg | ORAL_TABLET | Freq: Four times a day (QID) | ORAL | 0 refills | Status: DC | PRN

## 2020-01-03 MED ORDER — ACETAMINOPHEN 500 MG PO TABS
1000.0000 mg | ORAL_TABLET | Freq: Four times a day (QID) | ORAL | 0 refills | Status: DC
Start: 2020-01-03 — End: 2023-01-30

## 2020-01-03 NOTE — Progress Notes (Addendum)
      [address]       [place] [address]             [phone]      4 Days Post-Op Procedure(s) (LRB): XI ROBOTIC ASSISTED THORASCOPY - RIGHT LOWER AND MIDDLE LOBECTOMY (Right) VIDEO BRONCHOSCOPY (N/A) INTERCOSTAL NERVE BLOCK (Right) LYMPH NODE DISSECTION (Right) Subjective: Feels okay this morning. She is having some shoulder pain, but overall feels better.  Objective: Vital signs in last 24 hours: Temp:  [98.2 F (36.8 C)-98.5 F (36.9 C)] 98.5 F (36.9 C) (08/26 2306) Pulse Rate:  [74-94] 74 (08/26 2306) Cardiac Rhythm: Normal sinus rhythm (08/26 2230) Resp:  [15-20] 15 (08/26 2306) BP: (137-162)/(82-95) 145/84 (08/26 2306) SpO2:  [90 %-100 %] 100 % (08/26 2306)     Intake/Output from previous day: No intake/output data recorded. Intake/Output this shift: No intake/output data recorded.  General appearance: alert, cooperative and no distress Heart: regular rate and rhythm, S1, S2 normal, no murmur, click, rub or gallop Lungs: clear to auscultation bilaterally Abdomen: soft, non-tender; bowel sounds normal; no masses,  no organomegaly Extremities: extremities normal, atraumatic, no cyanosis or edema Wound: clean and dry  Lab Results: Recent Labs    01/01/20 0308  WBC 4.8  HGB 10.3*  HCT 32.8*  PLT 277   BMET:  Recent Labs    01/01/20 0308  NA 135  K 3.8  CL 103  CO2 23  GLUCOSE 87  BUN 8  CREATININE 0.74  CALCIUM 8.8*    PT/INR: No results for input(s): LABPROT, INR in the last 72 hours. ABG    Component Value Date/Time   PHART 7.400 12/30/2019 0715   HCO3 25.0 12/30/2019 0715   TCO2 28 05/05/2011 0921   O2SAT 95.6 12/30/2019 0715   CBG (last 3)  Recent Labs    01/01/20 1637 01/01/20 2121 01/02/20 0626  GLUCAP 86 83 100*    Assessment/Plan: S/P Procedure(s) (LRB): XI ROBOTIC ASSISTED THORASCOPY - RIGHT LOWER AND MIDDLE LOBECTOMY (Right) VIDEO BRONCHOSCOPY (N/A) INTERCOSTAL NERVE BLOCK (Right) LYMPH NODE  DISSECTION (Right)  1. Pulm- CXR shows: Postsurgical changes with volume loss in the right hemithorax.Right hydropneumothorax with stable small pneumothorax component. Chest tube removed yesterday.  2. CV- BP okay this morning. NSR in the 70s 3. Renal-creatinine0.74,  4. H and H 10.3/32.8, stable last dose of Toradol today 5. Blood glucose well controlled 6. GI- + BM, stomach pain has improved.  Plan:  Will discharge Janice Adams today if Dr. Roxan Hockey agrees. She states her stomach is feeling a little better and now shares her shoulder is bothering her. Suggested heat/ice therapy when she gets home.   LOS: 4 days    Janice Adams 01/03/2020 Patient seen and examined, agree with above Looks good and abdominal discomfort resolved Path still pending Dc home today  Revonda Standard. Roxan Hockey, MD [facility] [phone]

## 2020-01-03 NOTE — Progress Notes (Signed)
Discharge instructions reviewed  With patient and her daughter.  Questions addressed regarding incision/chest tube dressing sites.  Understanding expressed by both parties.  IV removed and telemetry discontinued.  CCMD notified.

## 2020-01-04 ENCOUNTER — Other Ambulatory Visit: Payer: Self-pay | Admitting: Family Medicine

## 2020-01-04 DIAGNOSIS — K219 Gastro-esophageal reflux disease without esophagitis: Secondary | ICD-10-CM

## 2020-01-06 ENCOUNTER — Ambulatory Visit (INDEPENDENT_AMBULATORY_CARE_PROVIDER_SITE_OTHER): Payer: Medicaid Other | Admitting: Family Medicine

## 2020-01-06 ENCOUNTER — Encounter (INDEPENDENT_AMBULATORY_CARE_PROVIDER_SITE_OTHER): Payer: Self-pay | Admitting: Family Medicine

## 2020-01-06 VITALS — BP 108/64 | HR 101 | Temp 97.8°F | Resp 18 | Ht 64.0 in | Wt 181.0 lb

## 2020-01-06 DIAGNOSIS — C3491 Malignant neoplasm of unspecified part of right bronchus or lung: Secondary | ICD-10-CM | POA: Diagnosis not present

## 2020-01-06 LAB — SURGICAL PATHOLOGY

## 2020-01-06 NOTE — Progress Notes (Signed)
Subjective:  Patient ID: Janice Adams, female    DOB: [birth year]  Age: 64 y.o. MRN: [id number]  CC: No chief complaint on file.   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 She presents today complaining pain at the incision site is an 8/10 and the pain medication makes her jittery. She has slight dyspnea but no chest pain. Follow up with her surgeon comes up soon. She has no additional concerns.   Past Medical History:  Diagnosis Date   Back pain    Cancer ([place])    Lung Cancer   GERD (gastroesophageal reflux disease)    Gout    no current problems per patient 05/31/19   History of epigastric pain    comes and goes   Hypertension    no meds    Past Surgical History:  Procedure Laterality Date   INTERCOSTAL NERVE BLOCK Right 12/30/2019   Procedure: INTERCOSTAL NERVE BLOCK;  Surgeon: Melrose Nakayama, MD;  Location: [facility];  Service: Thoracic;  Laterality: Right;   LYMPH NODE DISSECTION Right 12/30/2019   Procedure: LYMPH NODE DISSECTION;  Surgeon: Melrose Nakayama, MD;  Location: [facility];  Service: Thoracic;  Laterality: Right;   MULTIPLE TOOTH EXTRACTIONS     TONSILLECTOMY     uterine ablation     VIDEO BRONCHOSCOPY N/A 12/30/2019   Procedure: VIDEO BRONCHOSCOPY;  Surgeon: Melrose Nakayama, MD;  Location: [facility];  Service: Thoracic;  Laterality: N/A;   VIDEO BRONCHOSCOPY WITH ENDOBRONCHIAL ULTRASOUND N/A 06/04/2019   Procedure: VIDEO BRONCHOSCOPY WITH ENDOBRONCHIAL ULTRASOUND;  Surgeon: Margaretha Seeds, MD;  Location: [facility];  Service: Thoracic;  Laterality: N/A;   WISDOM TOOTH EXTRACTION      Family History  Problem Relation Age of Onset   Breast cancer Mother    Cancer Mother    Seizures Father    Breast cancer Maternal Grandmother    Breast cancer Daughter    Cancer Daughter    Heart attack  Brother     No Known Allergies  Outpatient Medications Prior to Visit  Medication Sig Dispense Refill   acetaminophen (TYLENOL) 500 MG tablet Take 2 tablets (1,000 mg total) by mouth every 6 (six) hours. 30 tablet 0   Multiple Vitamin (MULTIVITAMIN WITH MINERALS) TABS tablet Take 1 tablet by mouth daily.     omeprazole (PRILOSEC) 20 MG capsule TAKE ONE CAPSULE BY MOUTH TWICE DAILY BEFORE A MEAL TO LOWER STOMACH ACID 180 capsule 0   traMADol (ULTRAM) 50 MG tablet Take 1 tablet (50 mg total) by mouth every 6 (six) hours as needed (mild pain). 30 tablet 0   No facility-administered medications prior to visit.     ROS Review of Systems  Constitutional: Negative for activity change, appetite change and fatigue.  HENT: Negative for congestion, sinus pressure and sore throat.   Eyes: Negative for visual disturbance.  Respiratory: Negative for cough, chest tightness, shortness of breath and wheezing.   Cardiovascular: Negative for chest pain and palpitations.  Gastrointestinal: Negative for abdominal distention, abdominal pain and constipation.  Endocrine: Negative for polydipsia.  Genitourinary: Negative for dysuria and frequency.  Musculoskeletal: Negative for arthralgias and back pain.  Skin: Negative for rash.  Neurological: Negative for tremors, light-headedness and numbness.  Hematological: Does not bruise/bleed easily.  Psychiatric/Behavioral: Negative for agitation and behavioral problems.    Objective:  BP 108/64  Pulse (!) 101    Temp 97.8 F (36.6 C)    Resp 18    Ht 5\' 4"  (1.626 m)    Wt 181 lb (82.1 kg)    LMP  (LMP Unknown)    SpO2 96%    BMI 31.07 kg/m   BP/Weight 01/06/2020 01/03/2020 11/02/348  Systolic BP 093 818 -  Diastolic BP 64 68 -  Wt. (Lbs) 181 - 189.9  BMI 31.07 - 32.6      Physical Exam Constitutional:      Appearance: She is well-developed.  Neck:     Vascular: No JVD.  Cardiovascular:     Rate and Rhythm: Tachycardia present.     Heart  sounds: Normal heart sounds. No murmur heard.   Pulmonary:     Effort: Pulmonary effort is normal.     Breath sounds: Normal breath sounds. No wheezing or rales.     Comments: Right thoracotomy scars with seroma , suture in place, no active drainage but dressing stained  Chest:     Chest wall: No tenderness.  Abdominal:     General: Bowel sounds are normal. There is no distension.     Palpations: Abdomen is soft. There is no mass.     Tenderness: There is no abdominal tenderness.  Musculoskeletal:        General: Normal range of motion.     Right lower leg: No edema.     Left lower leg: No edema.  Neurological:     Mental Status: She is alert and oriented to person, place, and time.  Psychiatric:        Mood and Affect: Mood normal.     CMP Latest Ref Rng & Units 01/01/2020 12/31/2019 12/30/2019  Glucose 70 - 99 mg/dL 87 92 95  BUN 8 - 23 mg/dL 8 12 14   Creatinine 0.44 - 1.00 mg/dL 0.74 1.02(H) 1.02(H)  Sodium 135 - 145 mmol/L 135 138 139  Potassium 3.5 - 5.1 mmol/L 3.8 4.7 3.7  Chloride 98 - 111 mmol/L 103 102 106  CO2 22 - 32 mmol/L 23 22 25   Calcium 8.9 - 10.3 mg/dL 8.8(L) 9.1 9.4  Total Protein 6.5 - 8.1 g/dL 5.9(L) - 6.6  Total Bilirubin 0.3 - 1.2 mg/dL 0.8 - 0.4  Alkaline Phos 38 - 126 U/L 42 - 63  AST 15 - 41 U/L 20 - 17  ALT 0 - 44 U/L 14 - 17    Lipid Panel     Component Value Date/Time   CHOL 218 (H) 03/06/2018 1128   TRIG 130 03/06/2018 1128   HDL 55 03/06/2018 1128   CHOLHDL 4.0 03/06/2018 1128   CHOLHDL 3.7 07/21/2014 0946   VLDL 22 07/21/2014 0946   LDLCALC 137 (H) 03/06/2018 1128    CBC    Component Value Date/Time   WBC 4.8 01/01/2020 0308   RBC 3.29 (L) 01/01/2020 0308   HGB 10.3 (L) 01/01/2020 0308   HGB 12.9 12/02/2019 0948   HGB 13.9 03/06/2018 1128   HCT 32.8 (L) 01/01/2020 0308   HCT 41.8 03/06/2018 1128   PLT 277 01/01/2020 0308   PLT 248 12/02/2019 0948   PLT 337 03/06/2018 1128   MCV 99.7 01/01/2020 0308   MCV 94 03/06/2018 1128    MCH 31.3 01/01/2020 0308   MCHC 31.4 01/01/2020 0308   RDW 12.5 01/01/2020 0308   RDW 12.3 03/06/2018 1128   LYMPHSABS 0.7 12/02/2019 0948   LYMPHSABS 1.7 03/06/2018 1128   MONOABS 0.6  12/02/2019 0948   EOSABS 0.1 12/02/2019 0948   EOSABS 0.0 03/06/2018 1128   BASOSABS 0.0 12/02/2019 0948   BASOSABS 0.0 03/06/2018 1128    Lab Results  Component Value Date   HGBA1C 5.5 11/08/2012    Assessment & Plan:  1. Adenocarcinoma of right lung [facility]) S/p chemo, radiation and R lower and middle lobectomy  Dressing change performed in the Clinic She does have narcotic analgesic Keep follow up appointment with Cardiac Surgeon   Follow-up: Return in about 1 month (around 02/06/2020) for medical conditions with Dr Chapman Fitch.       Charlott Rakes, MD, FAAFP. [facility] [place], [place]   01/06/2020, 3:21 PM

## 2020-01-06 NOTE — Progress Notes (Signed)
R upper back incision side pain X 7 days since 12/30/2019

## 2020-01-10 ENCOUNTER — Other Ambulatory Visit: Payer: Self-pay | Admitting: Thoracic Surgery (Cardiothoracic Vascular Surgery)

## 2020-01-10 DIAGNOSIS — R918 Other nonspecific abnormal finding of lung field: Secondary | ICD-10-CM

## 2020-01-10 DIAGNOSIS — Z9889 Other specified postprocedural states: Secondary | ICD-10-CM

## 2020-01-14 ENCOUNTER — Encounter: Payer: Self-pay | Admitting: Thoracic Surgery (Cardiothoracic Vascular Surgery)

## 2020-01-14 ENCOUNTER — Other Ambulatory Visit: Payer: Self-pay

## 2020-01-14 ENCOUNTER — Ambulatory Visit
Admission: RE | Admit: 2020-01-14 | Discharge: 2020-01-14 | Disposition: A | Discharge status: Home / Self Care | Payer: Medicaid Other | Source: Ambulatory Visit | Attending: Thoracic Surgery (Cardiothoracic Vascular Surgery) | Admitting: Thoracic Surgery (Cardiothoracic Vascular Surgery)

## 2020-01-14 ENCOUNTER — Ambulatory Visit (INDEPENDENT_AMBULATORY_CARE_PROVIDER_SITE_OTHER): Payer: Self-pay | Admitting: Thoracic Surgery (Cardiothoracic Vascular Surgery)

## 2020-01-14 VITALS — BP 133/88 | HR 96 | Temp 97.3°F | Resp 18 | Ht 64.0 in | Wt 178.0 lb

## 2020-01-14 DIAGNOSIS — Z9889 Other specified postprocedural states: Secondary | ICD-10-CM

## 2020-01-14 DIAGNOSIS — C3491 Malignant neoplasm of unspecified part of right bronchus or lung: Secondary | ICD-10-CM

## 2020-01-14 DIAGNOSIS — R918 Other nonspecific abnormal finding of lung field: Secondary | ICD-10-CM

## 2020-01-14 IMAGING — CR DG CHEST 2V
2 series · 2 of 2 positions shown · non-contrast
Comparison: [DATE]

CLINICAL DATA: Status post lobectomy

EXAM:
CHEST - 2 VIEW

[w chest pa]
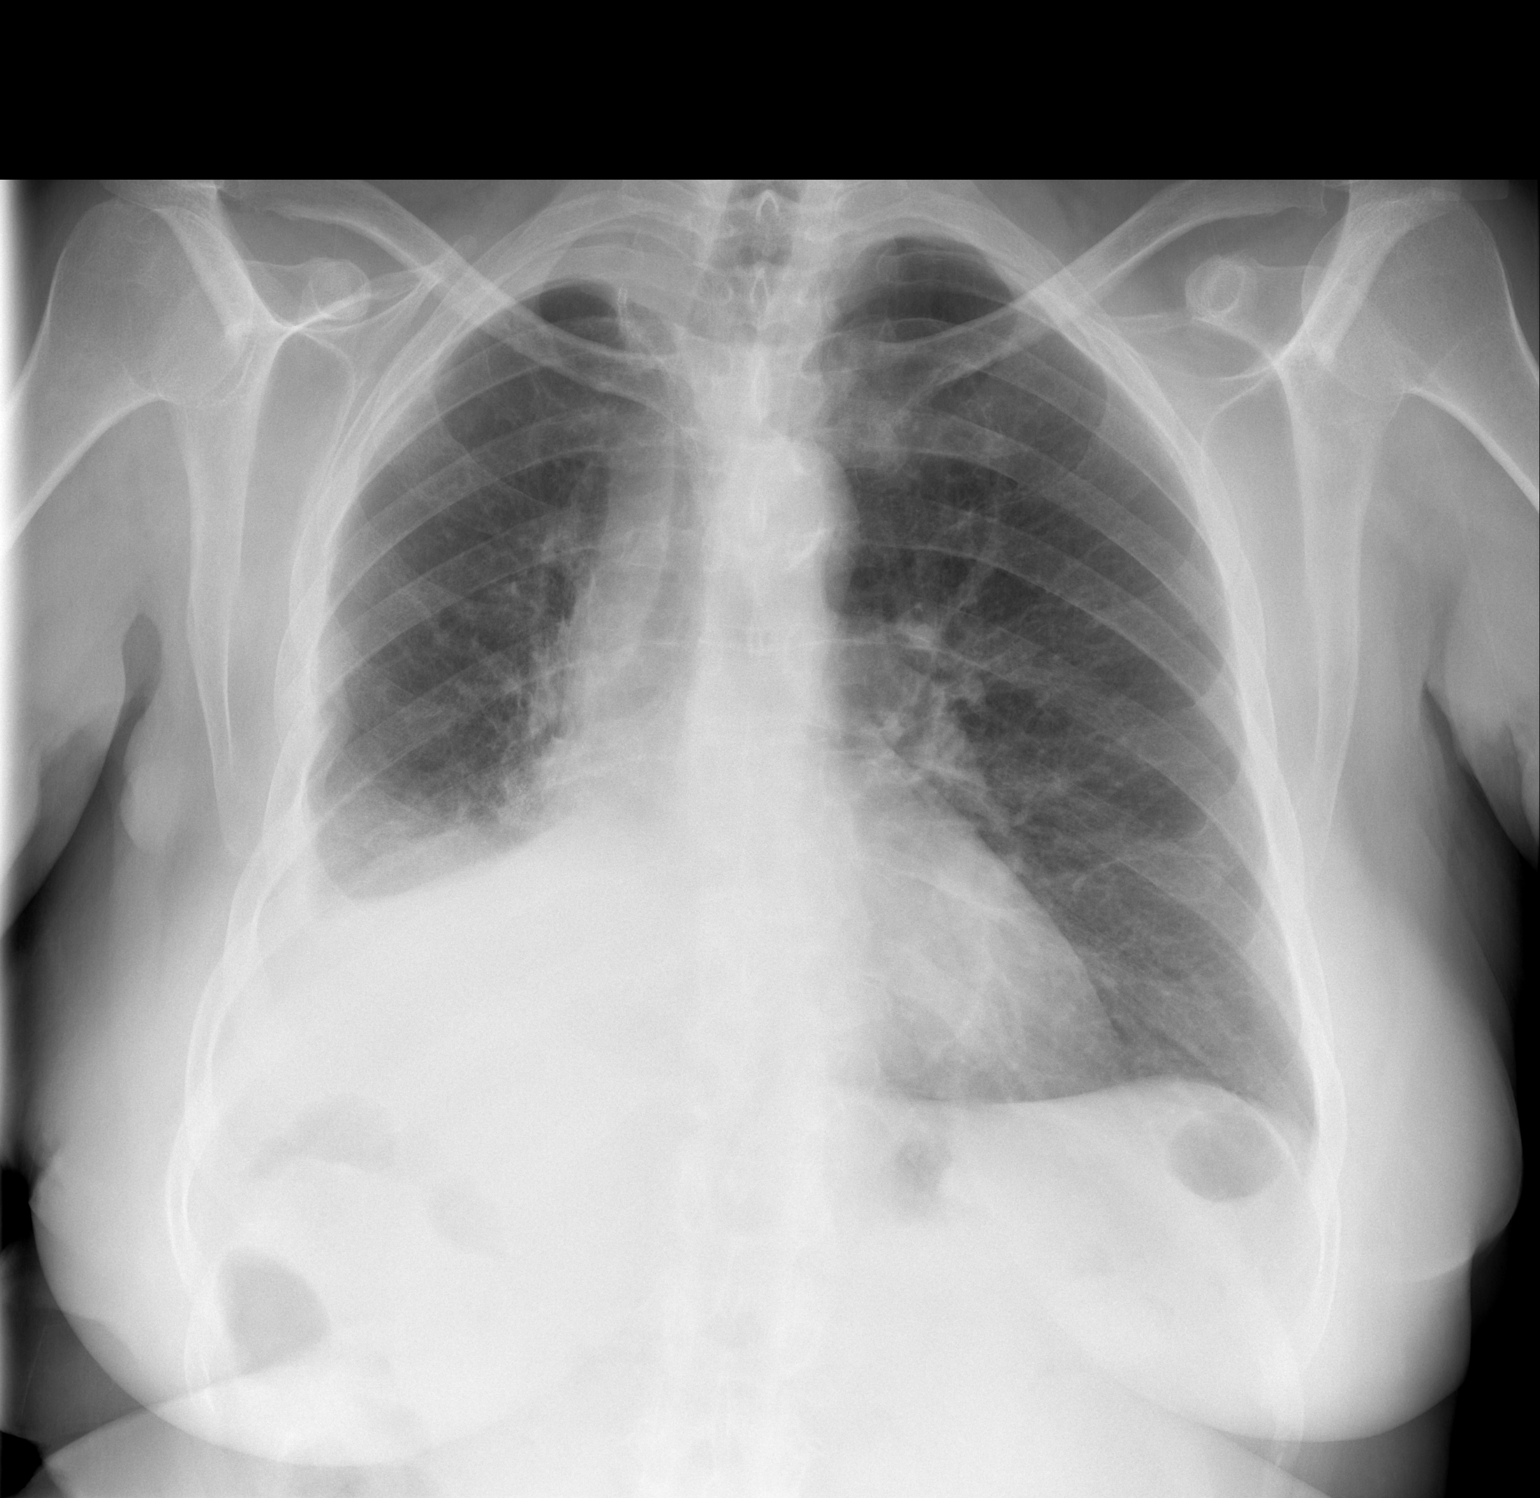

[w chest lat]
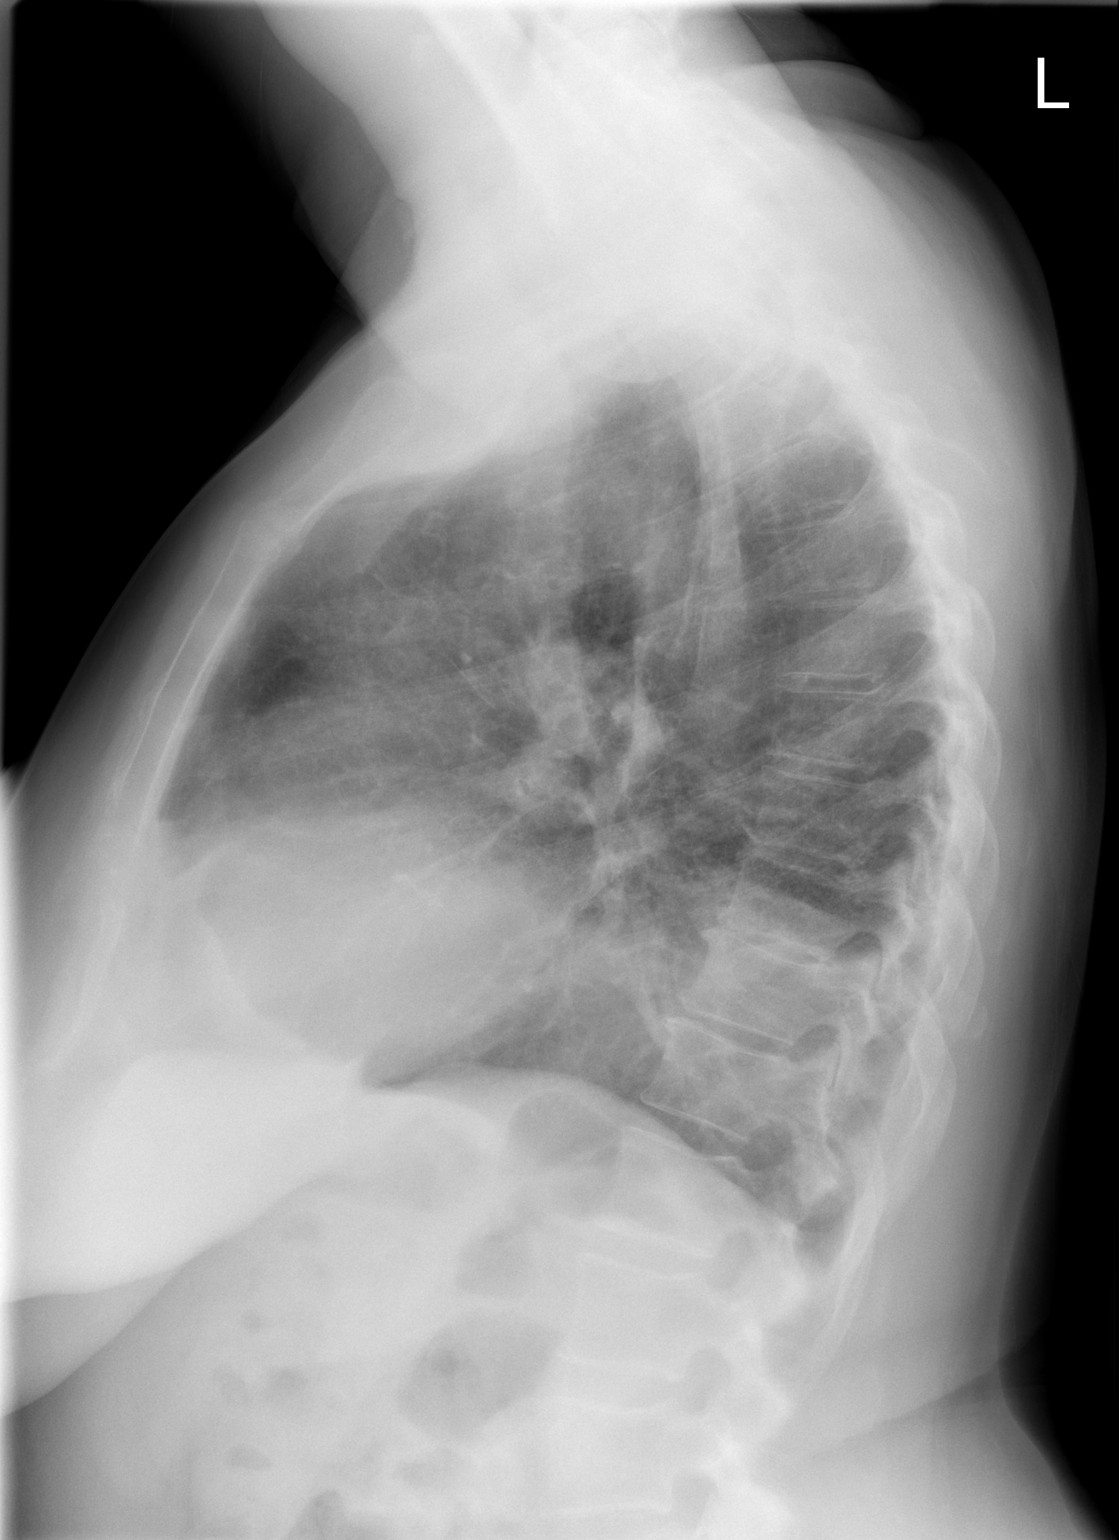

[2 of 2 positions shown; findings below may reference images not displayed]

FINDINGS: Redemonstrated postoperative findings of right upper lobectomy.
There is no significant residual pneumothorax. Unchanged moderate
right pleural effusion. The left lung is normally aerated. The heart
and mediastinum are unremarkable. Disc degenerative disease of the
thoracic spine.
IMPRESSION: 1. Redemonstrated postoperative findings of right upper lobectomy.
There is no significant residual pneumothorax. Unchanged moderate
right pleural effusion.

2.  The left lung is normally aerated.

## 2020-01-14 NOTE — Progress Notes (Signed)
Surgical dressing removed.  S/p Right RATS 12/30/19.  One suture removed with no signs/ symptoms of infection noted.  Incisions well approximated.  Patient tolerated procedure well.  Patient instructed to keep the incision sites clean and dry.

## 2020-01-14 NOTE — Progress Notes (Signed)
[address]       [place] [address]             [phone]     HPI: Janice Adams returns for scheduled follow-up visit  Janice Adams is a 64 year old woman with a history of tobacco abuse.  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  She was treated with chemotherapy and radiation with a good partial radiographic response.  She underwent robotic right lower and middle bilobectomy and node dissection on 12/30/2019.  Postoperatively she did well and went home on day four.  She has some mild incisional pain.  She is using tramadol at night before she goes to bed and then occasionally will take 1 in the morning when she gets up.  She does not have any shortness of breath.  Past Medical History:  Diagnosis Date   Back pain    Cancer ([place])    Lung Cancer   GERD (gastroesophageal reflux disease)    Gout    no current problems per patient 05/31/19   History of epigastric pain    comes and goes   Hypertension    no meds    Current Outpatient Medications  Medication Sig Dispense Refill   acetaminophen (TYLENOL) 500 MG tablet Take 2 tablets (1,000 mg total) by mouth every 6 (six) hours. 30 tablet 0   Multiple Vitamin (MULTIVITAMIN WITH MINERALS) TABS tablet Take 1 tablet by mouth daily.     omeprazole (PRILOSEC) 20 MG capsule TAKE ONE CAPSULE BY MOUTH TWICE DAILY BEFORE A MEAL TO LOWER STOMACH ACID 180 capsule 0   traMADol (ULTRAM) 50 MG tablet Take 1 tablet (50 mg total) by mouth every 6 (six) hours as needed (mild pain). 30 tablet 0   No current facility-administered medications for this visit.    Physical Exam BP 133/88 (BP Location: Right Arm, Patient Position: Sitting, Cuff Size: Normal)    Pulse 96    Temp (!) 97.3 F (36.3 C)    Resp 18    Ht 5\' 4"  (1.626 m)    Wt 178 lb (80.7 kg)    LMP  (LMP Unknown)    SpO2 94% Comment: RA   BMI 30.84 kg/m  64 year old woman in no acute distress Alert and  oriented x3 with no focal deficits Lungs breath sounds absent right base, otherwise clear Cardiac regular rate and rhythm No peripheral edema  Diagnostic Tests: CHEST - 2 VIEW  COMPARISON:  01/03/2020  FINDINGS: Redemonstrated postoperative findings of right upper lobectomy. There is no significant residual pneumothorax. Unchanged moderate right pleural effusion. The left lung is normally aerated. The heart and mediastinum are unremarkable. Disc degenerative disease of the thoracic spine.  IMPRESSION: 1. Redemonstrated postoperative findings of right upper lobectomy. There is no significant residual pneumothorax. Unchanged moderate right pleural effusion.  2.  The left lung is normally aerated.   Electronically Signed   By: Eddie Candle M.D.   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  She was treated with neoadjuvant chemoradiation and had a good radiographic response although it was partial.  She underwent robotic right lower and middle bilobectomy on 12/30/2019.  She did well postoperatively and went home on day four.  Pathology showed posttreatment T1, N0, stage Ia disease.  She is doing extremely well.  She has some mild incisional pain but really is only taking tramadol at night before she goes to bed.  She will work on weaning herself off of that over the next couple of weeks.  She does understand she can use Tylenol or Motrin for discomfort if needed.  There are no restrictions on her activities, but she was advised to build into new activities gradually over the next several weeks.  She has a follow-up appointment scheduled with Dr. Julien Nordmann in about 2 weeks.  Plan: Follow-up as scheduled with Dr. Julien Nordmann Return in 2 months with PA and  lateral chest x-ray   Melrose Nakayama, MD [facility] [phone]

## 2020-01-16 ENCOUNTER — Other Ambulatory Visit: Payer: Self-pay | Admitting: *Deleted

## 2020-01-16 ENCOUNTER — Telehealth: Payer: Self-pay | Admitting: Family Medicine

## 2020-01-16 NOTE — Telephone Encounter (Signed)
Called to inform the doctor that she is working with the patient for care management and if the doctor  Needs to reach out to her, she is available at [phone]

## 2020-01-16 NOTE — Telephone Encounter (Signed)
Noted  

## 2020-01-16 NOTE — Progress Notes (Signed)
The proposed treatment discussed in cancer conference is for discussion purpose only and is not a binding recommendation. The patient was not physically examined nor present for their treatment options. Therefore, final treatment plans cannot be decided.  ?

## 2020-01-20 ENCOUNTER — Encounter: Payer: Self-pay | Admitting: *Deleted

## 2020-01-20 ENCOUNTER — Other Ambulatory Visit: Payer: Self-pay

## 2020-01-20 DIAGNOSIS — G8918 Other acute postprocedural pain: Secondary | ICD-10-CM

## 2020-01-20 MED ORDER — TRAMADOL HCL 50 MG PO TABS: 50.0000 mg | ORAL_TABLET | Freq: Four times a day (QID) | ORAL | 0 refills | Status: DC | PRN

## 2020-01-20 NOTE — Progress Notes (Signed)
I followed up on Janice Adams's schedule.  She is scheduled for infusion on 9/20.  I updated Dr. Julien Nordmann. He would like her infusions to be cancelled.  I updated scheduling to call and cancel infusions but to keep her follow up with provider.

## 2020-01-21 ENCOUNTER — Telehealth: Payer: Self-pay | Admitting: Physician Assistant

## 2020-01-21 NOTE — Telephone Encounter (Signed)
Updated schedule pt per 9/13 sch msg - pt is aware of infusions being cancelled.

## 2020-01-25 NOTE — Progress Notes (Signed)
[facility] OFFICE PROGRESS NOTE  Antony Blackbird, MD [place] [address]  DIAGNOSIS: Stage IIIb (T3, N2, M0) non-small cell lung cancer, adenocarcinoma presented with right hilar mass with occlusion of the right middle lobe bronchus centrally with right middle lobe collapse diagnosed in January 2021.  PRIOR THERAPY: 1) Concurrent chemoradiation with weekly carboplatin for AUC of 2 and paclitaxel 45 mg/M2. First dose expected on July 09, 2019. Status post 8 cycles. Last dose was given Sep 09, 2019. 2) Consolidation treatment with immunotherapy with Imfinzi 1500 mg IV every 4 weeks. First dose 11/04/2019. Status post 2 cycles. Discontinued due to surgery 3) Robotic assisted bilobectomy under the care of Dr. Roxan Hockey on 12/30/19. Pathology showed posttreatment T1, N0, stage Ia disease.  CURRENT THERAPY: Observation   INTERVAL HISTORY: Janice Adams 64 y.o. female returns to the clinic for a follow up visit. The patient is feeling well today without any concerning complaints. She recently underwent a bilobectomy under the care of Dr. Roxan Hockey on 12/30/19. The patient tolerated the procedure well. She has some mild post surgical/incisional pain for which she takes tramadol at night. Denies any fever, chills, night sweats, or weight loss. Denies any shortness of breath, cough, or hemoptysis. Denies any nausea, vomiting, diarrhea, or constipation. Denies any headache or visual changes. Denies any rashes or skin changes. The patient is here today for evaluation and for a more detailed discussion about her current condition.     MEDICAL HISTORY: Past Medical History:  Diagnosis Date  . Back pain   . Cancer ([place])    Lung Cancer  . GERD (gastroesophageal reflux disease)   . Gout    no current problems per patient 05/31/19  . History of epigastric pain    comes and goes  . Hypertension    no meds    ALLERGIES:  has No Known Allergies.  MEDICATIONS:   Current Outpatient Medications  Medication Sig Dispense Refill  . acetaminophen (TYLENOL) 500 MG tablet Take 2 tablets (1,000 mg total) by mouth every 6 (six) hours. 30 tablet 0  . Multiple Vitamin (MULTIVITAMIN WITH MINERALS) TABS tablet Take 1 tablet by mouth daily.    Marland Kitchen omeprazole (PRILOSEC) 20 MG capsule TAKE ONE CAPSULE BY MOUTH TWICE DAILY BEFORE A MEAL TO LOWER STOMACH ACID 180 capsule 0  . traMADol (ULTRAM) 50 MG tablet Take 1 tablet (50 mg total) by mouth every 6 (six) hours as needed (mild pain). 28 tablet 0   No current facility-administered medications for this visit.    SURGICAL HISTORY:  Past Surgical History:  Procedure Laterality Date  . INTERCOSTAL NERVE BLOCK Right 12/30/2019   Procedure: INTERCOSTAL NERVE BLOCK;  Surgeon: Melrose Nakayama, MD;  Location: [facility];  Service: Thoracic;  Laterality: Right;  . LYMPH NODE DISSECTION Right 12/30/2019   Procedure: LYMPH NODE DISSECTION;  Surgeon: Melrose Nakayama, MD;  Location: [facility];  Service: Thoracic;  Laterality: Right;  . MULTIPLE TOOTH EXTRACTIONS    . TONSILLECTOMY    . uterine ablation    . VIDEO BRONCHOSCOPY N/A 12/30/2019   Procedure: VIDEO BRONCHOSCOPY;  Surgeon: Melrose Nakayama, MD;  Location: [facility];  Service: Thoracic;  Laterality: N/A;  . VIDEO BRONCHOSCOPY WITH ENDOBRONCHIAL ULTRASOUND N/A 06/04/2019   Procedure: VIDEO BRONCHOSCOPY WITH ENDOBRONCHIAL ULTRASOUND;  Surgeon: Margaretha Seeds, MD;  Location: [facility];  Service: Thoracic;  Laterality: N/A;  . WISDOM TOOTH EXTRACTION      REVIEW OF SYSTEMS:   Review of  Systems  Constitutional: Negative for appetite change, chills, fatigue, fever and unexpected weight change.  HENT: Negative for mouth sores, nosebleeds, sore throat and trouble swallowing.   Eyes: Negative for eye problems and icterus.  Respiratory: Negative for cough, hemoptysis, shortness of breath and wheezing.   Cardiovascular: Positive for mild chest soreness. Negative for leg  swelling.  Gastrointestinal: Negative for abdominal pain, constipation, diarrhea, nausea and vomiting.  Genitourinary: Negative for bladder incontinence, difficulty urinating, dysuria, frequency and hematuria.   Musculoskeletal: Negative for back pain, gait problem, neck pain and neck stiffness.  Skin: Negative for itching and rash.  Neurological: Negative for dizziness, extremity weakness, gait problem, headaches, light-headedness and seizures.  Hematological: Negative for adenopathy. Does not bruise/bleed easily.  Psychiatric/Behavioral: Negative for confusion, depression and sleep disturbance. The patient is not nervous/anxious.     PHYSICAL EXAMINATION:  Blood pressure 134/87, pulse 80, temperature 98.7 F (37.1 C), temperature source Tympanic, resp. rate 18, height 5\' 4"  (1.626 m), weight 177 lb 8 oz (80.5 kg), SpO2 92 %.  ECOG PERFORMANCE STATUS: 1 - Symptomatic but completely ambulatory  Physical Exam  Constitutional: Oriented to person, place, and time and well-developed, well-nourished, and in no distress.   HENT:  Head: Normocephalic and atraumatic.  Mouth/Throat: Oropharynx is clear and moist. No oropharyngeal exudate.  Eyes: Conjunctivae are normal. Right eye exhibits no discharge. Left eye exhibits no discharge. No scleral icterus.  Neck: Normal range of motion. Neck supple.  Cardiovascular: Normal rate, regular rhythm, normal heart sounds and intact distal pulses.   Pulmonary/Chest: Effort normal and breath sounds normal. No respiratory distress. No wheezes. No rales.  Abdominal: Soft. Bowel sounds are normal. Exhibits no distension and no mass. There is no tenderness.  Musculoskeletal: Normal range of motion. Exhibits no edema.  Lymphadenopathy:    No cervical adenopathy.  Neurological: Alert and oriented to person, place, and time. Exhibits normal muscle tone. Gait normal. Coordination normal.  Skin: Skin is warm and dry. No rash noted. Not diaphoretic. No erythema. No  pallor.  Psychiatric: Mood, memory and judgment normal.  Vitals reviewed.  LABORATORY DATA: Lab Results  Component Value Date   WBC 2.8 (L) 01/27/2020   HGB 12.8 01/27/2020   HCT 37.4 01/27/2020   MCV 93.3 01/27/2020   PLT 322 01/27/2020      Chemistry      Component Value Date/Time   NA 135 01/27/2020 0954   NA 146 (H) 03/06/2018 1128   K 3.3 (L) 01/27/2020 0954   CL 101 01/27/2020 0954   CO2 21 (L) 01/27/2020 0954   BUN 7 (L) 01/27/2020 0954   BUN 8 03/06/2018 1128   CREATININE 1.11 (H) 01/27/2020 0954   CREATININE 0.85 05/11/2016 1157      Component Value Date/Time   CALCIUM 10.3 01/27/2020 0954   ALKPHOS 67 01/27/2020 0954   AST 16 01/27/2020 0954   ALT 8 01/27/2020 0954   BILITOT 0.6 01/27/2020 0954       RADIOGRAPHIC STUDIES:  DG Chest 1 View  Result Date: 12/31/2019 CLINICAL DATA:  Post RIGHT lower and RIGHT middle lobe lobectomy, chest tube EXAM: CHEST  1 VIEW COMPARISON:  Portable exam at 0524 hrs compared to 12/30/2019 FINDINGS: RIGHT thoracostomy tube unchanged. Persistent RIGHT apex pneumothorax unchanged. Enlargement of cardiac silhouette. Atherosclerotic calcification aorta. Minimal bibasilar atelectasis greater on RIGHT. LEFT lung clear. Osseous structures unremarkable. IMPRESSION: Stable RIGHT thoracostomy tube with persistent RIGHT apex pneumothorax and minimal bibasilar atelectasis. Electronically Signed   By: Crist Infante.D.  On: 12/31/2019 08:01   DG Chest 2 View  Result Date: 01/14/2020 CLINICAL DATA:  Status post lobectomy EXAM: CHEST - 2 VIEW COMPARISON:  01/03/2020 FINDINGS: Redemonstrated postoperative findings of right upper lobectomy. There is no significant residual pneumothorax. Unchanged moderate right pleural effusion. The left lung is normally aerated. The heart and mediastinum are unremarkable. Disc degenerative disease of the thoracic spine. IMPRESSION: 1. Redemonstrated postoperative findings of right upper lobectomy. There is no  significant residual pneumothorax. Unchanged moderate right pleural effusion. 2.  The left lung is normally aerated. Electronically Signed   By: Eddie Candle M.D.   On: 01/14/2020 10:23   DG Chest 2 View  Result Date: 01/03/2020 CLINICAL DATA:  Status post lobectomy EXAM: CHEST - 2 VIEW COMPARISON:  01/02/2020 FINDINGS: Postsurgical changes with volume loss in the right hemithorax. Right hydropneumothorax with stable small pneumothorax component and small right pleural effusion component, increased. Left lung is essentially clear. The heart is normal in size. IMPRESSION: Postsurgical changes with volume loss in the right hemithorax. Right hydropneumothorax with stable small pneumothorax component, as above. Electronically Signed   By: Julian Hy M.D.   On: 01/03/2020 06:54   DG Chest 2 View  Result Date: 12/30/2019 CLINICAL DATA:  Preoperative, right lung cancer EXAM: CHEST - 2 VIEW COMPARISON:  08/19/2019, CT chest, 12/10/2019 FINDINGS: Interval increase in consolidation of the right middle lobe, in keeping with findings of prior CT. There is no acute appearing airspace opacity. The heart mediastinum are unremarkable. Disc degenerative disease of the thoracic spine. IMPRESSION: Interval increase in consolidation of the right middle lobe, in keeping with findings of prior CT and treated appearance of lung malignancy. No acute appearing airspace opacity. Electronically Signed   By: Eddie Candle M.D.   On: 12/30/2019 08:13   DG Chest 1V REPEAT Same Day  Result Date: 01/02/2020 CLINICAL DATA:  Chest tube removal EXAM: CHEST - 1 VIEW SAME DAY COMPARISON:  January 02, 2020 study obtained earlier in the day FINDINGS: Chest tube on the right is been removed. Stable apical pneumothorax on the right without tension component. There is volume loss on the right with elevation the right hemidiaphragm and right base atelectasis. Left lung clear. Heart size and contour within normal limits. No adenopathy. There  is aortic atherosclerosis. No bone lesions. IMPRESSION: Essentially stable pneumothorax on the right without tension component following chest tube removal. Volume loss on the right, stable. No new opacity. Stable cardiac silhouette. Aortic Atherosclerosis (ICD10-I70.0). Electronically Signed   By: Lowella Grip III M.D.   On: 01/02/2020 12:24   DG Chest Port 1 View  Result Date: 01/02/2020 CLINICAL DATA:  Recent lobectomy on the right with postoperative change. Chest tube in place. EXAM: PORTABLE CHEST 1 VIEW COMPARISON:  January 01, 2020 FINDINGS: Stable chest tube positioning on the right. There is a small right apical pneumothorax, stable. There is atelectatic change in the right base. There is no edema or airspace opacity. Left lung is clear. Heart is normal in size and contour. The pulmonary vascularity within normal limits. No adenopathy. There is aortic atherosclerosis. No bone lesions. IMPRESSION: Stable chest tube positioning on the right with small right apical pneumothorax. No tension component. Atelectasis right base with volume loss on the right. Left lung clear. No new opacity evident. Stable cardiac silhouette. Aortic Atherosclerosis (ICD10-I70.0). Electronically Signed   By: Lowella Grip III M.D.   On: 01/02/2020 08:11   DG Chest Port 1 View  Result Date: 01/01/2020 CLINICAL DATA:  Pneumothorax, chest tube. EXAM: PORTABLE CHEST 1 VIEW COMPARISON:  12/31/2019 FINDINGS: Similar positioning of the right thoracostomy tube tube. Similar size of the right apical pneumothorax. Similar enlargement the cardiopericardial silhouette. Aortic atherosclerosis. Similar streaky right greater than left basilar opacities, compatible with atelectasis. No consolidation.w No sizable pleural effusion. No acute osseous abnormality. IMPRESSION: No substantial change. Similar position of the right thoracostomy tube with similar right apical pneumothorax and mild bibasilar atelectasis. Electronically Signed    By: Margaretha Sheffield MD   On: 01/01/2020 08:14   DG Chest Port 1 View  Result Date: 12/30/2019 CLINICAL DATA:  64 year old female with history of robotic assisted lobectomy. EXAM: PORTABLE CHEST 1 VIEW COMPARISON:  Chest x-ray 12/30/2019. FINDINGS: Postoperative changes of right middle and lower lobectomy with compensatory hyperexpansion of the right upper lobe. Small right apical pneumothorax. Right-sided chest tube is in position with tip near the apex of the right hemithorax. Elevation of the right hemidiaphragm. Left lung is clear. No pleural effusions. No evidence of pulmonary edema. Heart size is mildly enlarged. Upper mediastinal contours are within normal limits. Aortic atherosclerosis. Subcutaneous emphysema in the lateral aspect of the right chest wall. IMPRESSION: 1. Status post right middle and lower lobectomy with right-sided chest tube in position and small right apical pneumothorax. 2. Mild cardiomegaly. 3. Aortic atherosclerosis. Electronically Signed   By: Janice Adams M.D.   On: 12/30/2019 12:25     ASSESSMENT/PLAN:  This is a very pleasant 64 year old African-American female diagnosed with stage IIIb (T3, N2, M0) non-small cell lung cancer, adenocarcinoma presented with right hilar mass with occlusion of the right middle lobe bronchus as well as right middle lobe collapse in addition to right lower lobe pulmonary nodule diagnosed in January 2021.  She completed a course of concurrent chemoradiation with weekly carboplatin and paclitaxel status post 8 cycles.  She tolerated her treatment well except for fatigue.  The patient underwent two cycles of consolidation immunotherapy with Imfinzi 1500 mg IV every 4 weeks.  She underwent a bilobectomy under the care of Dr. Roxan Hockey on 12/30/19 and tolerated it well.   The patient was seen with Dr. Julien Nordmann. Labs were reviewed. Dr. Julien Nordmann recommends that she continue on observation at this point with a repeat CT scan in 3 months.    We will see her back for a follow up visit in 3 months for evaluation and to review her CT scan.   The patient was advised to call immediately if she has any concerning symptoms in the interval. The patient voices understanding of current disease status and treatment options and is in agreement with the current care plan. All questions were answered. The patient knows to call the clinic with any problems, questions or concerns. We can certainly see the patient much sooner if necessary  Orders Placed This Encounter  Procedures  . CT Chest W Contrast    Standing Status:   Future    Standing Expiration Date:   01/26/2021    Order Specific Question:   If indicated for the ordered procedure, I authorize the administration of contrast media per Radiology protocol    Answer:   Yes    Order Specific Question:   Preferred imaging location?    Answer:   [facility]    Order Specific Question:   Radiology Contrast Protocol - do NOT remove file path    Answer:   [email or web address]  . CMP ([facility] only)    Standing Status:   Future  Standing Expiration Date:   01/26/2021  . CBC with Differential (Cancer Center Only)    Standing Status:   Future    Standing Expiration Date:   01/26/2021     Tobe Sos Cheyan Frees, PA-C 01/27/20  ADDENDUM: Hematology/Oncology Attending: I had a face-to-face encounter with the patient today.  I recommended her care plan.  This is a very pleasant 63 years old African-American female with history of stage IIIb (T3, N2, M0) non-small cell lung cancer, adenocarcinoma presented with right hilar mass with occlusion of the right middle lobe bronchus as well as right middle lobe collapse in addition to right lower lobe pulmonary nodule diagnosed in January 2021.  She is status post a course of concurrent chemoradiation with weekly carboplatin and paclitaxel with partial response.  She also received 1 dose of  treatment with immunotherapy.  The patient was seen by Dr. Roxan Hockey and he felt that she would be a good candidate for surgical resection.  She underwent right bilobectomy on 12/30/2019 and tolerated the procedure well.  The final pathology showed ypT1b, ypN0. The patient is here today for evaluation and recommendation regarding her condition.  She is feeling much better with no concerning complaints. I recommended for the patient to continue on observation with repeat CT scan of the chest in 3 months.  There is no role for additional adjuvant chemotherapy for her condition at this point. The patient was advised to call immediately if she has any concerning symptoms in the interval.  Disclaimer: This note was dictated with voice recognition software. Similar sounding words can inadvertently be transcribed and may be missed upon review. Eilleen Kempf, MD 01/27/20

## 2020-01-27 ENCOUNTER — Inpatient Hospital Stay: Payer: Medicaid Other

## 2020-01-27 ENCOUNTER — Inpatient Hospital Stay: Payer: Medicaid Other | Attending: Internal Medicine

## 2020-01-27 ENCOUNTER — Other Ambulatory Visit: Payer: Self-pay

## 2020-01-27 ENCOUNTER — Inpatient Hospital Stay (HOSPITAL_BASED_OUTPATIENT_CLINIC_OR_DEPARTMENT_OTHER): Payer: Medicaid Other | Admitting: Physician Assistant

## 2020-01-27 VITALS — BP 134/87 | HR 80 | Temp 98.7°F | Resp 18 | Ht 64.0 in | Wt 177.5 lb

## 2020-01-27 DIAGNOSIS — Z79899 Other long term (current) drug therapy: Secondary | ICD-10-CM | POA: Diagnosis not present

## 2020-01-27 DIAGNOSIS — C342 Malignant neoplasm of middle lobe, bronchus or lung: Secondary | ICD-10-CM | POA: Insufficient documentation

## 2020-01-27 DIAGNOSIS — R918 Other nonspecific abnormal finding of lung field: Secondary | ICD-10-CM | POA: Diagnosis not present

## 2020-01-27 LAB — CBC WITH DIFFERENTIAL (CANCER CENTER ONLY)
Abs Immature Granulocytes: 0 10*3/uL (ref 0.00–0.07)
Basophils Absolute: 0 10*3/uL (ref 0.0–0.1)
Basophils Relative: 1 %
Eosinophils Absolute: 0.1 10*3/uL (ref 0.0–0.5)
Eosinophils Relative: 2 %
HCT: 37.4 % (ref 36.0–46.0)
Hemoglobin: 12.8 g/dL (ref 12.0–15.0)
Immature Granulocytes: 0 %
Lymphocytes Relative: 30 %
Lymphs Abs: 0.8 10*3/uL (ref 0.7–4.0)
MCH: 31.9 pg (ref 26.0–34.0)
MCHC: 34.2 g/dL (ref 30.0–36.0)
MCV: 93.3 fL (ref 80.0–100.0)
Monocytes Absolute: 0.3 10*3/uL (ref 0.1–1.0)
Monocytes Relative: 9 %
Neutro Abs: 1.6 10*3/uL — ABNORMAL LOW (ref 1.7–7.7)
Neutrophils Relative %: 58 %
Platelet Count: 322 10*3/uL (ref 150–400)
RBC: 4.01 MIL/uL (ref 3.87–5.11)
RDW: 14.2 % (ref 11.5–15.5)
WBC Count: 2.8 10*3/uL — ABNORMAL LOW (ref 4.0–10.5)
nRBC: 0 % (ref 0.0–0.2)

## 2020-01-27 LAB — CMP (CANCER CENTER ONLY)
ALT: 8 U/L (ref 0–44)
AST: 16 U/L (ref 15–41)
Albumin: 3.9 g/dL (ref 3.5–5.0)
Alkaline Phosphatase: 67 U/L (ref 38–126)
Anion gap: 13 (ref 5–15)
BUN: 7 mg/dL — ABNORMAL LOW (ref 8–23)
CO2: 21 mmol/L — ABNORMAL LOW (ref 22–32)
Calcium: 10.3 mg/dL (ref 8.9–10.3)
Chloride: 101 mmol/L (ref 98–111)
Creatinine: 1.11 mg/dL — ABNORMAL HIGH (ref 0.44–1.00)
GFR, Est AFR Am: >60 mL/min (ref >60)
GFR, Estimated: 52 mL/min — ABNORMAL LOW (ref >60)
Glucose, Bld: 96 mg/dL (ref 70–99)
Potassium: 3.3 mmol/L — ABNORMAL LOW (ref 3.5–5.1)
Sodium: 135 mmol/L (ref 135–145)
Total Bilirubin: 0.6 mg/dL (ref 0.3–1.2)
Total Protein: 7.7 g/dL (ref 6.5–8.1)

## 2020-01-27 LAB — TSH: TSH: 176.606 u[IU]/mL — ABNORMAL HIGH (ref 0.308–3.960)

## 2020-01-28 ENCOUNTER — Other Ambulatory Visit: Payer: Self-pay | Admitting: Physician Assistant

## 2020-01-28 ENCOUNTER — Telehealth: Payer: Self-pay | Admitting: Internal Medicine

## 2020-01-28 ENCOUNTER — Telehealth: Payer: Self-pay | Admitting: Physician Assistant

## 2020-01-28 DIAGNOSIS — E039 Hypothyroidism, unspecified: Secondary | ICD-10-CM

## 2020-01-28 MED ORDER — LEVOTHYROXINE SODIUM 75 MCG PO TABS: 75.0000 ug | ORAL_TABLET | Freq: Every day | ORAL | 2 refills | Status: DC

## 2020-01-28 NOTE — Telephone Encounter (Signed)
Called the patient to let her know that her TSH was elevated. A prescription for synthroid was sent to her pharmacy. We will make her a lab appointment in a few weeks to follow her TSH. She expressed understanding of the instructions.

## 2020-01-28 NOTE — Telephone Encounter (Signed)
Scheduled per los. Called and spoke with patient. Confirmed appt. Mailed printout per patient request

## 2020-02-05 ENCOUNTER — Telehealth: Payer: Self-pay

## 2020-02-05 NOTE — Telephone Encounter (Signed)
Per chart review pt was given 90-day supply on 01/05/20, will follow-up w/ pt and pharmacy to see why RF needed now.

## 2020-02-05 NOTE — Telephone Encounter (Signed)
Patient came into the office requesting a refill on her medication   Last filled 01/05/2020     omeprazole (PRILOSEC) 20 MG capsule

## 2020-02-06 NOTE — Telephone Encounter (Signed)
Called [facility] and they reported that pt never picked up med when it was 1st prescribed on 8/29. They will fill it and contact pt when it is ready, copay will be $3, called pt and informed of this.

## 2020-02-24 ENCOUNTER — Other Ambulatory Visit: Payer: Self-pay | Admitting: Internal Medicine

## 2020-02-24 ENCOUNTER — Inpatient Hospital Stay: Payer: Medicare Other | Attending: Internal Medicine

## 2020-02-24 ENCOUNTER — Other Ambulatory Visit: Payer: Self-pay

## 2020-02-24 ENCOUNTER — Ambulatory Visit: Payer: Medicaid Other | Admitting: Internal Medicine

## 2020-02-24 ENCOUNTER — Ambulatory Visit: Payer: Medicaid Other

## 2020-02-24 DIAGNOSIS — E039 Hypothyroidism, unspecified: Secondary | ICD-10-CM

## 2020-02-24 DIAGNOSIS — C342 Malignant neoplasm of middle lobe, bronchus or lung: Secondary | ICD-10-CM | POA: Insufficient documentation

## 2020-02-24 DIAGNOSIS — Z79899 Other long term (current) drug therapy: Secondary | ICD-10-CM | POA: Diagnosis not present

## 2020-02-24 LAB — CBC WITH DIFFERENTIAL (CANCER CENTER ONLY)
Abs Immature Granulocytes: 0.01 10*3/uL (ref 0.00–0.07)
Basophils Absolute: 0 10*3/uL (ref 0.0–0.1)
Basophils Relative: 1 %
Eosinophils Absolute: 0 10*3/uL (ref 0.0–0.5)
Eosinophils Relative: 1 %
HCT: 36.9 % (ref 36.0–46.0)
Hemoglobin: 12.3 g/dL (ref 12.0–15.0)
Immature Granulocytes: 0 %
Lymphocytes Relative: 23 %
Lymphs Abs: 0.9 10*3/uL (ref 0.7–4.0)
MCH: 32.5 pg (ref 26.0–34.0)
MCHC: 33.3 g/dL (ref 30.0–36.0)
MCV: 97.4 fL (ref 80.0–100.0)
Monocytes Absolute: 0.4 10*3/uL (ref 0.1–1.0)
Monocytes Relative: 10 %
Neutro Abs: 2.5 10*3/uL (ref 1.7–7.7)
Neutrophils Relative %: 65 %
Platelet Count: 325 10*3/uL (ref 150–400)
RBC: 3.79 MIL/uL — ABNORMAL LOW (ref 3.87–5.11)
RDW: 16.3 % — ABNORMAL HIGH (ref 11.5–15.5)
WBC Count: 3.8 10*3/uL — ABNORMAL LOW (ref 4.0–10.5)
nRBC: 0 % (ref 0.0–0.2)

## 2020-02-24 LAB — CMP (CANCER CENTER ONLY)
ALT: 6 U/L (ref 0–44)
AST: 12 U/L — ABNORMAL LOW (ref 15–41)
Albumin: 3.9 g/dL (ref 3.5–5.0)
Alkaline Phosphatase: 87 U/L (ref 38–126)
Anion gap: 6 (ref 5–15)
BUN: 12 mg/dL (ref 8–23)
CO2: 28 mmol/L (ref 22–32)
Calcium: 9.9 mg/dL (ref 8.9–10.3)
Chloride: 102 mmol/L (ref 98–111)
Creatinine: 0.99 mg/dL (ref 0.44–1.00)
GFR, Estimated: 60 mL/min — ABNORMAL LOW (ref >60)
Glucose, Bld: 97 mg/dL (ref 70–99)
Potassium: 4.3 mmol/L (ref 3.5–5.1)
Sodium: 136 mmol/L (ref 135–145)
Total Bilirubin: 0.7 mg/dL (ref 0.3–1.2)
Total Protein: 7.9 g/dL (ref 6.5–8.1)

## 2020-02-24 LAB — TSH: TSH: 128.209 u[IU]/mL — ABNORMAL HIGH (ref 0.308–3.960)

## 2020-02-24 MED ORDER — LEVOTHYROXINE SODIUM 112 MCG PO TABS: 112.0000 ug | ORAL_TABLET | Freq: Every day | ORAL | 1 refills | Status: DC

## 2020-02-25 ENCOUNTER — Other Ambulatory Visit: Payer: Self-pay | Admitting: Physician Assistant

## 2020-02-25 ENCOUNTER — Telehealth: Payer: Self-pay | Admitting: Physician Assistant

## 2020-02-25 DIAGNOSIS — E039 Hypothyroidism, unspecified: Secondary | ICD-10-CM

## 2020-02-25 NOTE — Telephone Encounter (Signed)
I called the patient to let her know that Dr. Julien Nordmann increased the dose of her synthroid to 112 mcg. I let her know that we would recheck her TSH in 4 weeks. She expressed understanding. I have sent a scheduling message to make a lab only visit in 4 weeks.

## 2020-02-25 NOTE — Telephone Encounter (Signed)
Scheduled apt per 10/19 sch msg - pt is aware of app on 11/16

## 2020-03-17 ENCOUNTER — Encounter: Payer: Medicaid Other | Admitting: Thoracic Surgery (Cardiothoracic Vascular Surgery)

## 2020-03-20 ENCOUNTER — Telehealth: Payer: Self-pay | Admitting: Physician Assistant

## 2020-03-20 NOTE — Telephone Encounter (Signed)
I received a message indicating the patient needed to reschedule her appointments until after thanksgiving. The patient had a death in the family. I reviewed her appointments which all look acceptable. She will come in on 12/1 to recheck her TSH and have her routine labs performed prior to her upcoming CT scan. I gave her the number to radiology scheduling and instructed her to schedule her scan for ~12/13-12/17. We will see her on 12/20 and review the results with her.

## 2020-03-24 ENCOUNTER — Inpatient Hospital Stay: Payer: Medicare Other

## 2020-04-08 ENCOUNTER — Inpatient Hospital Stay: Payer: Medicare Other | Attending: Internal Medicine

## 2020-04-08 DIAGNOSIS — Z79899 Other long term (current) drug therapy: Secondary | ICD-10-CM | POA: Insufficient documentation

## 2020-04-08 DIAGNOSIS — I10 Essential (primary) hypertension: Secondary | ICD-10-CM | POA: Insufficient documentation

## 2020-04-08 DIAGNOSIS — E039 Hypothyroidism, unspecified: Secondary | ICD-10-CM | POA: Insufficient documentation

## 2020-04-08 DIAGNOSIS — C342 Malignant neoplasm of middle lobe, bronchus or lung: Secondary | ICD-10-CM | POA: Insufficient documentation

## 2020-04-13 ENCOUNTER — Other Ambulatory Visit: Payer: Self-pay | Admitting: Thoracic Surgery (Cardiothoracic Vascular Surgery)

## 2020-04-13 DIAGNOSIS — R918 Other nonspecific abnormal finding of lung field: Secondary | ICD-10-CM

## 2020-04-14 ENCOUNTER — Other Ambulatory Visit: Payer: Self-pay

## 2020-04-14 ENCOUNTER — Ambulatory Visit (INDEPENDENT_AMBULATORY_CARE_PROVIDER_SITE_OTHER): Payer: Medicare Other | Admitting: Thoracic Surgery (Cardiothoracic Vascular Surgery)

## 2020-04-14 ENCOUNTER — Ambulatory Visit
Admission: RE | Admit: 2020-04-14 | Discharge: 2020-04-14 | Disposition: A | Discharge status: Home / Self Care | Payer: Medicaid Other | Source: Ambulatory Visit | Attending: Thoracic Surgery (Cardiothoracic Vascular Surgery) | Admitting: Thoracic Surgery (Cardiothoracic Vascular Surgery)

## 2020-04-14 ENCOUNTER — Encounter: Payer: Self-pay | Admitting: Thoracic Surgery (Cardiothoracic Vascular Surgery)

## 2020-04-14 VITALS — BP 141/90 | HR 95 | Temp 98.1°F | Resp 20 | Ht 64.0 in | Wt 197.0 lb

## 2020-04-14 DIAGNOSIS — C342 Malignant neoplasm of middle lobe, bronchus or lung: Secondary | ICD-10-CM

## 2020-04-14 DIAGNOSIS — Z9889 Other specified postprocedural states: Secondary | ICD-10-CM | POA: Diagnosis not present

## 2020-04-14 DIAGNOSIS — R918 Other nonspecific abnormal finding of lung field: Secondary | ICD-10-CM

## 2020-04-14 IMAGING — CR DG CHEST 2V
2 series · 2 of 2 positions shown · non-contrast
Comparison: CT [DATE].  Chest x-ray [DATE].

CLINICAL DATA: Right lung mass.

EXAM:
CHEST - 2 VIEW

[w chest pa]
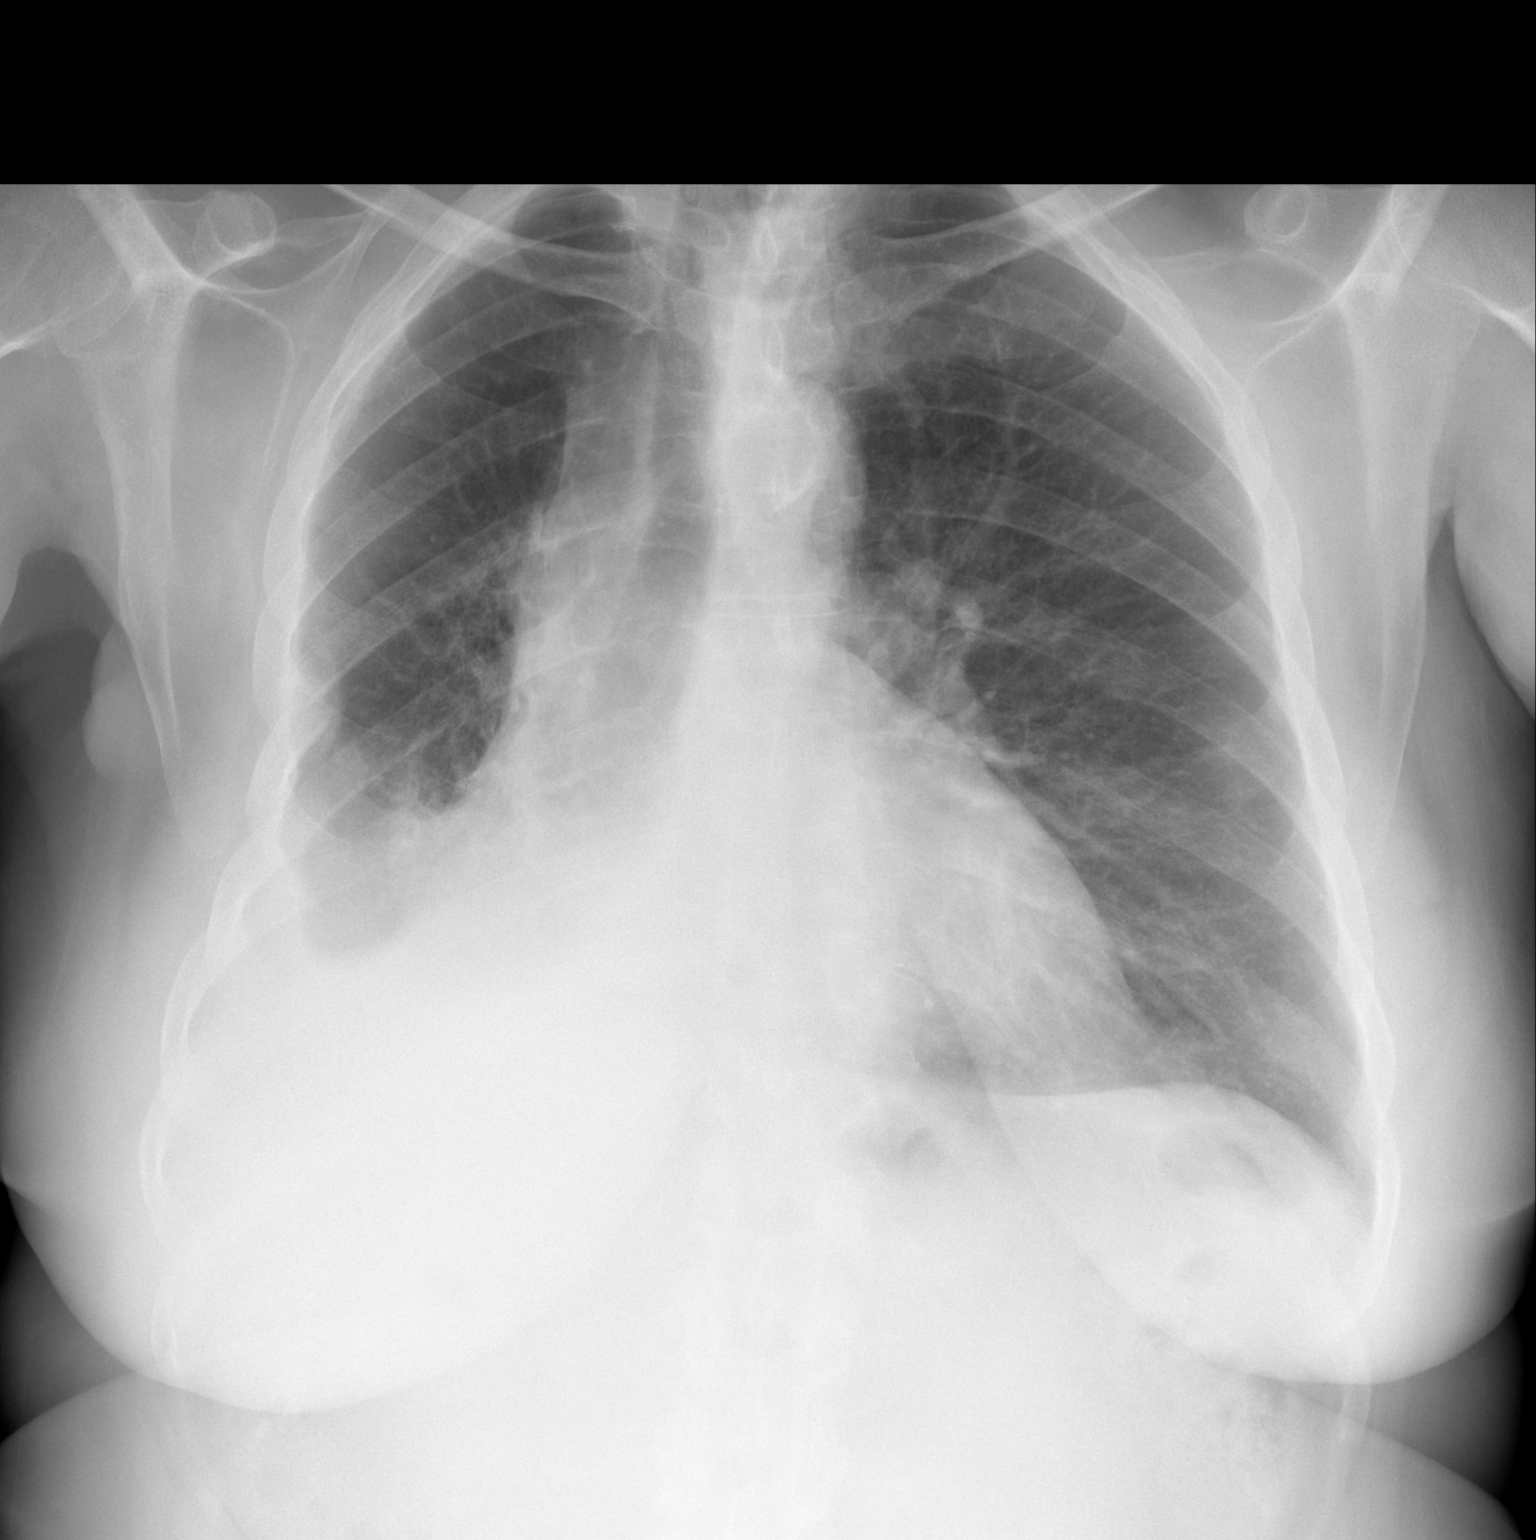

[w chest lat]
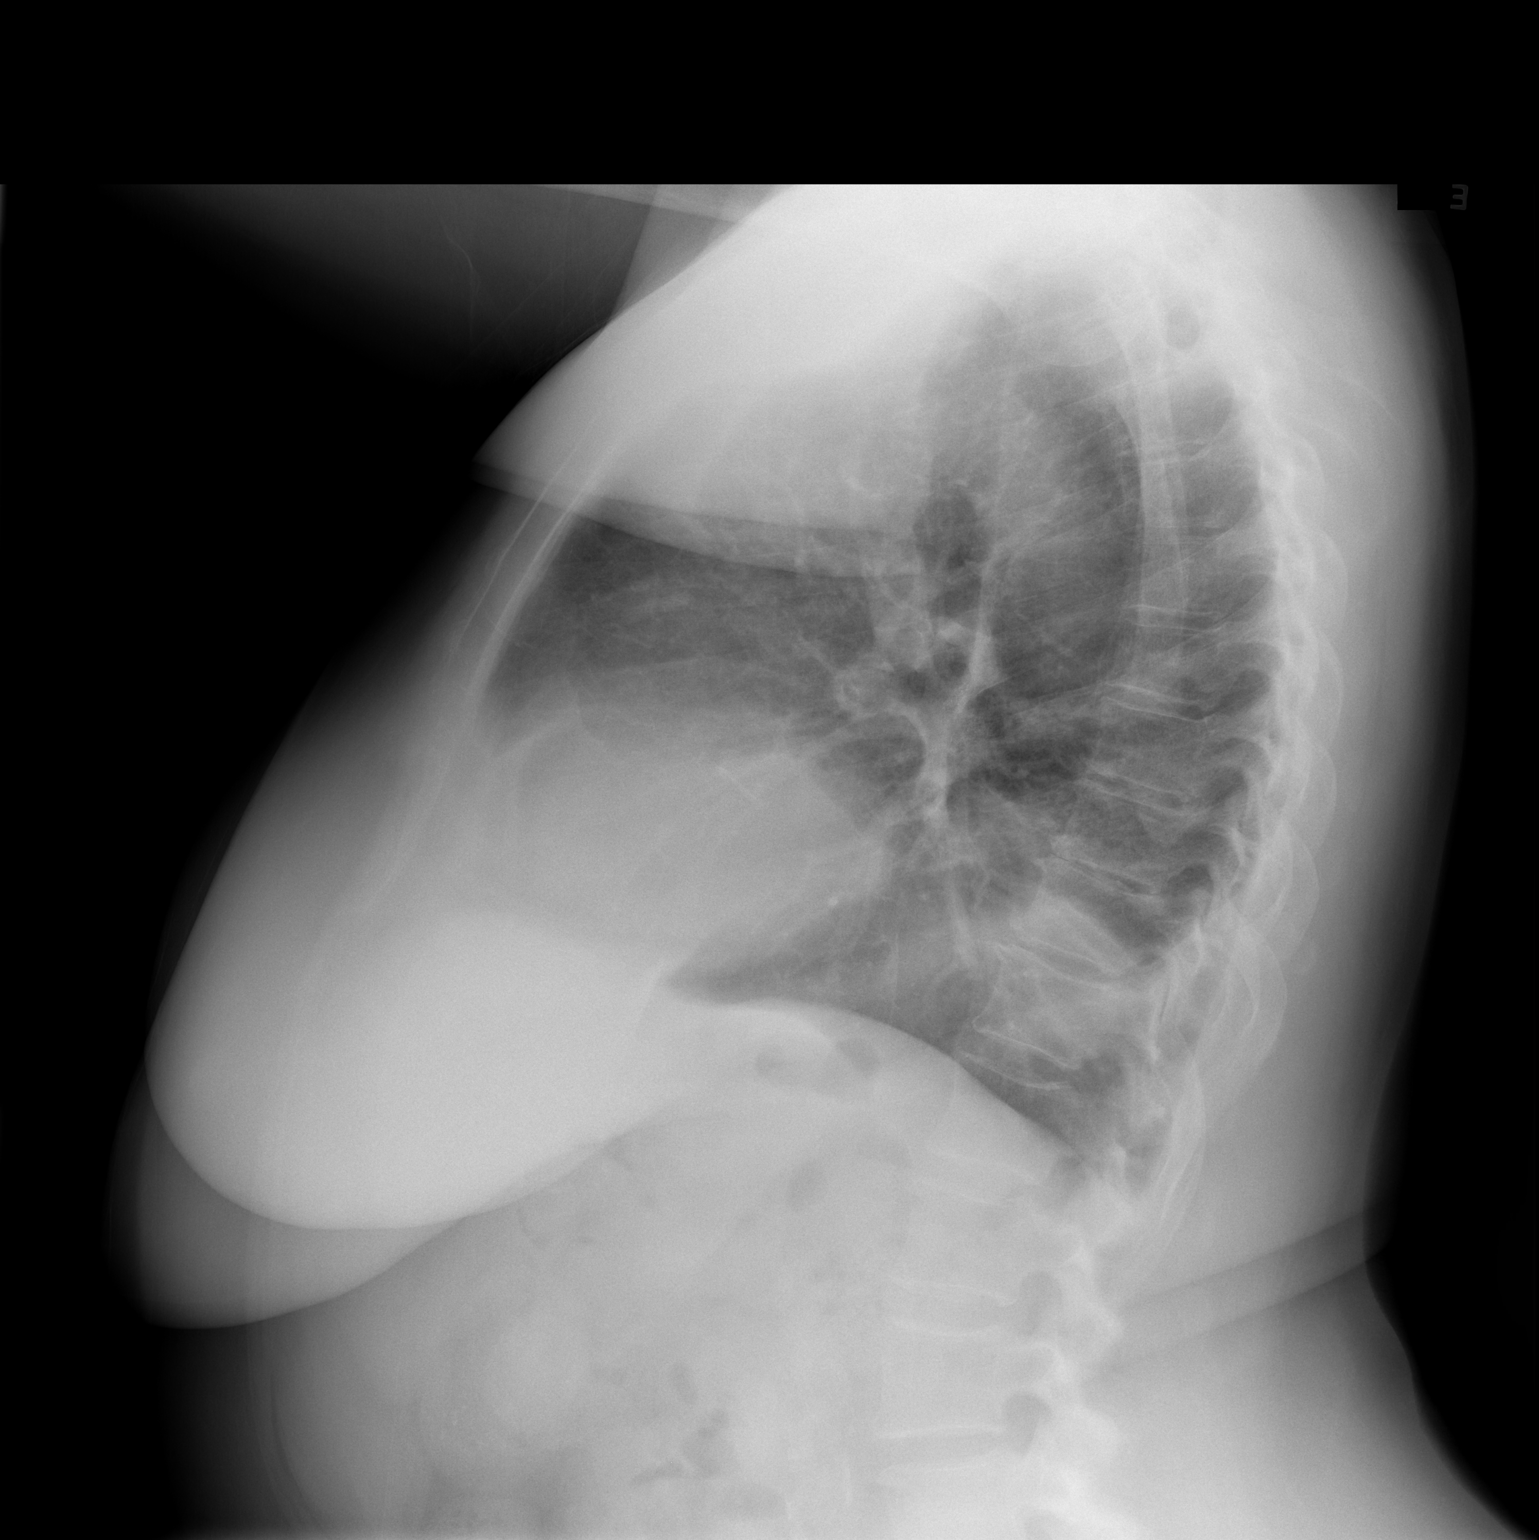

[2 of 2 positions shown; findings below may reference images not displayed]

FINDINGS: Right pleural effusion with right basilar opacity again noted.
Effusion has decreased slightly since prior chest x-ray. Left lung
clear. Heart is borderline in size. No acute bony abnormality.
IMPRESSION: Small right pleural effusion with right basilar atelectasis or
infiltrate. Effusion has decreased slightly since prior chest x-ray.

## 2020-04-14 NOTE — Progress Notes (Signed)
[address]       [place] [address]             [phone]       HPI: Ms. Bohlman returns for scheduled follow-up visit  Jerlean Peralta is a 64 year old woman with a history of tobacco abuse who was diagnosed with stage IIb adenocarcinoma of the right middle lobe in January 2021. She had neoadjuvant chemotherapy and radiation with a partial radiographic response. She then underwent a robotic right lower and middle lobectomy on 12/30/2019. Her postoperative course was uncomplicated and she went home on day 4. Her posttreatment staging was PY T1, N0  Overall she is doing well. She does still have some vague discomfort on the right side. This is mostly numbness but with some discomfort associated as well. She is not having any respiratory issues. She has gained some weight since she quit smoking.  Past Medical History:  Diagnosis Date  . Back pain   . Cancer ([place])    Lung Cancer  . GERD (gastroesophageal reflux disease)   . Gout    no current problems per patient 05/31/19  . History of epigastric pain    comes and goes  . Hypertension    no meds    Current Outpatient Medications  Medication Sig Dispense Refill  . acetaminophen (TYLENOL) 500 MG tablet Take 2 tablets (1,000 mg total) by mouth every 6 (six) hours. 30 tablet 0  . levothyroxine (SYNTHROID) 112 MCG tablet Take 1 tablet (112 mcg total) by mouth daily before breakfast. 30 tablet 1  . Multiple Vitamin (MULTIVITAMIN WITH MINERALS) TABS tablet Take 1 tablet by mouth daily.    Marland Kitchen omeprazole (PRILOSEC) 20 MG capsule TAKE ONE CAPSULE BY MOUTH TWICE DAILY BEFORE A MEAL TO LOWER STOMACH ACID 180 capsule 0  . traMADol (ULTRAM) 50 MG tablet Take 1 tablet (50 mg total) by mouth every 6 (six) hours as needed (mild pain). 28 tablet 0   No current facility-administered medications for this visit.    Physical Exam BP (!) 141/90 (BP Location: Right Arm, Patient Position: Sitting, Cuff Size: Normal)   Pulse 95    Temp 98.1 F (36.7 C) (Skin)   Resp 20   Ht 5\' 4"  (1.626 m)   Wt 197 lb (89.4 kg)   LMP  (LMP Unknown)   SpO2 94% Comment: RA  BMI 33.78 kg/m  64 year old woman in no acute distress Alert and oriented x3 with no focal deficits Cardiac regular rate and rhythm Lungs diminished at right base, otherwise clear Incisions well-healed  Diagnostic Tests: CHEST - 2 VIEW  COMPARISON:  CT 12/10/2019.  Chest x-ray 01/14/2020.  FINDINGS: Right pleural effusion with right basilar opacity again noted. Effusion has decreased slightly since prior chest x-ray. Left lung clear. Heart is borderline in size. No acute bony abnormality.  IMPRESSION: Small right pleural effusion with right basilar atelectasis or infiltrate. Effusion has decreased slightly since prior chest x-ray.   Electronically Signed   By: Rolm Baptise M.D.   On: 04/14/2020 11:41 I personally reviewed the chest x-ray images and concur with the findings noted above  Impression: Nechelle Petrizzo is a 64 year old woman who presented originally with a T3, N2, stage IIIb non-small cell carcinoma of the right middle lobe. She had neoadjuvant chemoradiation. She then underwent a robotic right lower and middle lobectomy. Postoperative staging was PY T1, N0.  She is now about 3 months out from surgery. She is doing well. She has  some mild intercostal neuralgia discomfort but otherwise is doing well.   Dr. Julien Nordmann will be doing scans on her per protocol.  Plan: I will plan to see her back in 9 months for a 1 year follow-up  Melrose Nakayama, MD [facility] [phone]

## 2020-04-24 ENCOUNTER — Other Ambulatory Visit: Payer: Medicaid Other

## 2020-04-24 ENCOUNTER — Ambulatory Visit (HOSPITAL_COMMUNITY)
Admission: RE | Admit: 2020-04-24 | Discharge: 2020-04-24 | Disposition: A | Discharge status: Home / Self Care | Payer: Medicare Other | Source: Ambulatory Visit | Attending: Physician Assistant | Admitting: Physician Assistant

## 2020-04-24 ENCOUNTER — Other Ambulatory Visit: Payer: Self-pay

## 2020-04-24 ENCOUNTER — Inpatient Hospital Stay: Payer: Medicare Other

## 2020-04-24 DIAGNOSIS — R918 Other nonspecific abnormal finding of lung field: Secondary | ICD-10-CM | POA: Diagnosis present

## 2020-04-24 DIAGNOSIS — I10 Essential (primary) hypertension: Secondary | ICD-10-CM | POA: Diagnosis not present

## 2020-04-24 DIAGNOSIS — Z79899 Other long term (current) drug therapy: Secondary | ICD-10-CM | POA: Diagnosis not present

## 2020-04-24 DIAGNOSIS — E039 Hypothyroidism, unspecified: Secondary | ICD-10-CM | POA: Diagnosis not present

## 2020-04-24 DIAGNOSIS — C342 Malignant neoplasm of middle lobe, bronchus or lung: Secondary | ICD-10-CM | POA: Diagnosis present

## 2020-04-24 LAB — CMP (CANCER CENTER ONLY)
ALT: 12 U/L (ref 0–44)
AST: 19 U/L (ref 15–41)
Albumin: 4.1 g/dL (ref 3.5–5.0)
Alkaline Phosphatase: 95 U/L (ref 38–126)
Anion gap: 9 (ref 5–15)
BUN: 19 mg/dL (ref 8–23)
CO2: 26 mmol/L (ref 22–32)
Calcium: 10 mg/dL (ref 8.9–10.3)
Chloride: 105 mmol/L (ref 98–111)
Creatinine: 1.32 mg/dL — ABNORMAL HIGH (ref 0.44–1.00)
GFR, Estimated: 45 mL/min — ABNORMAL LOW (ref >60)
Glucose, Bld: 118 mg/dL — ABNORMAL HIGH (ref 70–99)
Potassium: 4 mmol/L (ref 3.5–5.1)
Sodium: 140 mmol/L (ref 135–145)
Total Bilirubin: 0.5 mg/dL (ref 0.3–1.2)
Total Protein: 8.1 g/dL (ref 6.5–8.1)

## 2020-04-24 LAB — CBC WITH DIFFERENTIAL (CANCER CENTER ONLY)
Abs Immature Granulocytes: 0.01 10*3/uL (ref 0.00–0.07)
Basophils Absolute: 0 10*3/uL (ref 0.0–0.1)
Basophils Relative: 1 %
Eosinophils Absolute: 0.1 10*3/uL (ref 0.0–0.5)
Eosinophils Relative: 2 %
HCT: 36.6 % (ref 36.0–46.0)
Hemoglobin: 12.1 g/dL (ref 12.0–15.0)
Immature Granulocytes: 0 %
Lymphocytes Relative: 30 %
Lymphs Abs: 1.1 10*3/uL (ref 0.7–4.0)
MCH: 33.7 pg (ref 26.0–34.0)
MCHC: 33.1 g/dL (ref 30.0–36.0)
MCV: 101.9 fL — ABNORMAL HIGH (ref 80.0–100.0)
Monocytes Absolute: 0.3 10*3/uL (ref 0.1–1.0)
Monocytes Relative: 8 %
Neutro Abs: 2.2 10*3/uL (ref 1.7–7.7)
Neutrophils Relative %: 59 %
Platelet Count: 279 10*3/uL (ref 150–400)
RBC: 3.59 MIL/uL — ABNORMAL LOW (ref 3.87–5.11)
RDW: 15.2 % (ref 11.5–15.5)
WBC Count: 3.6 10*3/uL — ABNORMAL LOW (ref 4.0–10.5)
nRBC: 0 % (ref 0.0–0.2)

## 2020-04-24 MED ORDER — IOHEXOL 300 MG/ML  SOLN
75.0000 mL | Freq: Once | INTRAMUSCULAR | Status: AC | PRN
  Administered 2020-04-24: 15:00:00 60 mL via INTRAVENOUS

## 2020-04-24 MED ORDER — SODIUM CHLORIDE (PF) 0.9 % IJ SOLN
INTRAMUSCULAR | Status: AC
  Filled 2020-04-24: qty 50

## 2020-04-27 ENCOUNTER — Ambulatory Visit: Payer: Medicaid Other | Admitting: Physician Assistant

## 2020-04-27 ENCOUNTER — Telehealth: Payer: Self-pay | Admitting: Physician Assistant

## 2020-04-27 ENCOUNTER — Other Ambulatory Visit: Payer: Self-pay | Admitting: Physician Assistant

## 2020-04-27 DIAGNOSIS — E039 Hypothyroidism, unspecified: Secondary | ICD-10-CM

## 2020-04-27 LAB — TSH: TSH: 171.373 u[IU]/mL — ABNORMAL HIGH (ref 0.308–3.960)

## 2020-04-27 MED ORDER — LEVOTHYROXINE SODIUM 150 MCG PO TABS: 150.0000 ug | ORAL_TABLET | Freq: Every day | ORAL | 2 refills | Status: DC

## 2020-04-27 NOTE — Telephone Encounter (Signed)
I attempted to call the patient; however, the phone kept ringing without going to voicemail. I was calling because she had an appointment schedule for today. She did not show up for her appointment. I have sent a scheduling message to reschedule for this week or next week. Additionally, her TSH continues to be very high. I increased her dose of synthroid to 150 mcg and have sent the prescription to the pharmacy. I was calling her to discuss this. Pending a call back.

## 2020-04-28 ENCOUNTER — Telehealth: Payer: Self-pay | Admitting: Internal Medicine

## 2020-04-28 NOTE — Telephone Encounter (Signed)
Scheduled appt per 12/20 sch msg - pt is aware of apt date and time

## 2020-05-04 ENCOUNTER — Inpatient Hospital Stay (HOSPITAL_BASED_OUTPATIENT_CLINIC_OR_DEPARTMENT_OTHER): Payer: Medicare Other | Admitting: Internal Medicine

## 2020-05-04 ENCOUNTER — Encounter: Payer: Self-pay | Admitting: Internal Medicine

## 2020-05-04 ENCOUNTER — Other Ambulatory Visit: Payer: Self-pay

## 2020-05-04 VITALS — BP 114/84 | HR 95 | Temp 98.9°F | Resp 16 | Ht 64.0 in | Wt 198.4 lb

## 2020-05-04 DIAGNOSIS — C3490 Malignant neoplasm of unspecified part of unspecified bronchus or lung: Secondary | ICD-10-CM | POA: Diagnosis not present

## 2020-05-04 DIAGNOSIS — E039 Hypothyroidism, unspecified: Secondary | ICD-10-CM | POA: Diagnosis not present

## 2020-05-04 DIAGNOSIS — C342 Malignant neoplasm of middle lobe, bronchus or lung: Secondary | ICD-10-CM | POA: Diagnosis not present

## 2020-05-04 NOTE — Progress Notes (Signed)
[place] Telephone:(336) [phone]   Fax:(336) [phone]  OFFICE PROGRESS NOTE  Fulp, Cammie, MD (Inactive) No address on file  DIAGNOSIS: Stage IIIB (T3, M2, M0) non-small cell lung cancer, adenocarcinoma presented with right hilar mass with occlusion of the right middle lobe bronchus centrally with right middle lobe collapse diagnosed in January 2021.    PRIOR THERAPY: 1) Concurrent chemoradiation with weekly carboplatin for AUC of 2 and paclitaxel 45 mg/M2. First dose expected on July 09, 2019. Status post 8 cycles. Last dose was given Sep 09, 2019. 2) Consolidation treatment with immunotherapy with Imfinzi 1500 mg IV every 4 weeks. First dose 11/04/2019.Status post 2 cycles.Discontinued due to surgery 3) Robotic assisted right lower and middle lobectomy with lymph node dissection bilobectomy under the care of Dr. Roxan Hockey on 12/30/19. Pathology showed posttreatment T1, N0, stage Ia disease.  CURRENT THERAPY: Observation   INTERVAL HISTORY: Janice Adams 64 y.o. female returns to the clinic today for follow-up visit.  The patient is feeling fine today with no concerning complaints except for fatigue and weight gain.  She has not been taking her thyroid medicine as prescribed.  The patient denied having any current chest pain, shortness of breath, cough or hemoptysis.  She denied having any fever or chills.  She has no nausea, vomiting, diarrhea or constipation.  She denied having any headache or visual changes.  She had repeat CT scan of the chest performed recently and she is here for evaluation and discussion of her risk her results.   MEDICAL HISTORY: Past Medical History:  Diagnosis Date   Back pain    Cancer ([place])    Lung Cancer   GERD (gastroesophageal reflux disease)    Gout    no current problems per patient 05/31/19   History of epigastric pain    comes and goes   Hypertension    no meds    ALLERGIES:  has No Known  Allergies.  MEDICATIONS:  Current Outpatient Medications  Medication Sig Dispense Refill   acetaminophen (TYLENOL) 500 MG tablet Take 2 tablets (1,000 mg total) by mouth every 6 (six) hours. 30 tablet 0   levothyroxine (SYNTHROID) 150 MCG tablet Take 1 tablet (150 mcg total) by mouth daily before breakfast. 30 tablet 2   Multiple Vitamin (MULTIVITAMIN WITH MINERALS) TABS tablet Take 1 tablet by mouth daily.     omeprazole (PRILOSEC) 20 MG capsule TAKE ONE CAPSULE BY MOUTH TWICE DAILY BEFORE A MEAL TO LOWER STOMACH ACID 180 capsule 0   traMADol (ULTRAM) 50 MG tablet Take 1 tablet (50 mg total) by mouth every 6 (six) hours as needed (mild pain). 28 tablet 0   No current facility-administered medications for this visit.    SURGICAL HISTORY:  Past Surgical History:  Procedure Laterality Date   INTERCOSTAL NERVE BLOCK Right 12/30/2019   Procedure: INTERCOSTAL NERVE BLOCK;  Surgeon: Melrose Nakayama, MD;  Location: [facility];  Service: Thoracic;  Laterality: Right;   LYMPH NODE DISSECTION Right 12/30/2019   Procedure: LYMPH NODE DISSECTION;  Surgeon: Melrose Nakayama, MD;  Location: [facility];  Service: Thoracic;  Laterality: Right;   MULTIPLE TOOTH EXTRACTIONS     TONSILLECTOMY     uterine ablation     VIDEO BRONCHOSCOPY N/A 12/30/2019   Procedure: VIDEO BRONCHOSCOPY;  Surgeon: Melrose Nakayama, MD;  Location: [facility];  Service: Thoracic;  Laterality: N/A;   VIDEO BRONCHOSCOPY WITH ENDOBRONCHIAL ULTRASOUND N/A 06/04/2019   Procedure: VIDEO BRONCHOSCOPY WITH ENDOBRONCHIAL  ULTRASOUND;  Surgeon: Margaretha Seeds, MD;  Location: [facility];  Service: Thoracic;  Laterality: N/A;   WISDOM TOOTH EXTRACTION      REVIEW OF SYSTEMS:  Constitutional: positive for fatigue Eyes: negative Ears, nose, mouth, throat, and face: negative Respiratory: negative Cardiovascular: negative Gastrointestinal: negative Genitourinary:negative Integument/breast: negative Hematologic/lymphatic:  negative Musculoskeletal:negative Neurological: negative Behavioral/Psych: negative Endocrine: negative Allergic/Immunologic: negative   PHYSICAL EXAMINATION: General appearance: alert, cooperative, fatigued and no distress Head: Normocephalic, without obvious abnormality, atraumatic Neck: no adenopathy, no JVD, supple, symmetrical, trachea midline and thyroid not enlarged, symmetric, no tenderness/mass/nodules Lymph nodes: Cervical, supraclavicular, and axillary nodes normal. Resp: clear to auscultation bilaterally Back: symmetric, no curvature. ROM normal. No CVA tenderness. Cardio: Tachycardic GI: soft, non-tender; bowel sounds normal; no masses,  no organomegaly Extremities: extremities normal, atraumatic, no cyanosis or edema Neurologic: Alert and oriented X 3, normal strength and tone. Normal symmetric reflexes. Normal coordination and gait  ECOG PERFORMANCE STATUS: 1 - Symptomatic but completely ambulatory  Blood pressure 114/84, pulse 95, temperature 98.9 F (37.2 C), resp. rate 16, height 5\' 4"  (1.626 m), weight 198 lb 6.4 oz (90 kg), SpO2 98 %.  LABORATORY DATA: Lab Results  Component Value Date   WBC 3.6 (L) 04/24/2020   HGB 12.1 04/24/2020   HCT 36.6 04/24/2020   MCV 101.9 (H) 04/24/2020   PLT 279 04/24/2020      Chemistry      Component Value Date/Time   NA 140 04/24/2020 1407   NA 146 (H) 03/06/2018 1128   K 4.0 04/24/2020 1407   CL 105 04/24/2020 1407   CO2 26 04/24/2020 1407   BUN 19 04/24/2020 1407   BUN 8 03/06/2018 1128   CREATININE 1.32 (H) 04/24/2020 1407   CREATININE 0.85 05/11/2016 1157      Component Value Date/Time   CALCIUM 10.0 04/24/2020 1407   ALKPHOS 95 04/24/2020 1407   AST 19 04/24/2020 1407   ALT 12 04/24/2020 1407   BILITOT 0.5 04/24/2020 1407       RADIOGRAPHIC STUDIES: DG Chest 2 View  Result Date: 04/14/2020 CLINICAL DATA:  Right lung mass. EXAM: CHEST - 2 VIEW COMPARISON:  CT 12/10/2019.  Chest x-ray 01/14/2020.  FINDINGS: Right pleural effusion with right basilar opacity again noted. Effusion has decreased slightly since prior chest x-ray. Left lung clear. Heart is borderline in size. No acute bony abnormality. IMPRESSION: Small right pleural effusion with right basilar atelectasis or infiltrate. Effusion has decreased slightly since prior chest x-ray. Electronically Signed   By: Rolm Baptise M.D.   On: 04/14/2020 11:41   CT Chest W Contrast  Result Date: 04/27/2020 CLINICAL DATA:  Non-small-cell lung cancer.  Restaging. EXAM: CT CHEST WITH CONTRAST TECHNIQUE: Multidetector CT imaging of the chest was performed during intravenous contrast administration. CONTRAST:  69mL OMNIPAQUE IOHEXOL 300 MG/ML  SOLN COMPARISON:  12/10/2019 FINDINGS: Cardiovascular: The heart size is normal. No substantial pericardial effusion. Atherosclerotic calcification is noted in the wall of the thoracic aorta. Mediastinum/Nodes: No mediastinal lymphadenopathy. There is no hilar lymphadenopathy. The esophagus has normal imaging features. There is no axillary lymphadenopathy. Superficial subcutaneous nodules in the anterior right axilla are stable, likely sebaceous cysts. Lungs/Pleura: Volume loss right hemithorax compatible with right middle and lower lobectomy. Centrilobular emphsyema noted. Scarring in the parahilar right lung is compatible with radiation fibrosis. Small right pleural effusion noted with loculated component anteriorly on this initial postsurgical CT scan. No suspicious pulmonary nodule or mass. No focal airspace consolidation. Upper Abdomen: Unremarkable. Musculoskeletal: No  worrisome lytic or sclerotic osseous abnormality. Nonacute, unfused posterior right rib fractures noted. IMPRESSION: 1. Interval right middle and lower lobectomy. No recurrent or metastatic disease evident by CT. 2. Small right pleural effusion with loculated component anteriorly. 3. Aortic Atherosclerosis (ICD10-I70.0) and Emphysema (ICD10-J43.9).  Electronically Signed   By: Misty Stanley M.D.   On: 04/27/2020 08:19    ASSESSMENT AND PLAN: This is a very pleasant 64 years old African-American female diagnosed with stage IIIb (T3, N2, M0) non-small cell lung cancer, adenocarcinoma presented with right hilar mass with occlusion of the right middle lobe bronchus as well as right middle lobe collapse in addition to right lower lobe pulmonary nodule diagnosed in January 2021. She completed a course of concurrent chemoradiation with weekly carboplatin and paclitaxel status post 8 cycles.   The patient received 2 cycles of consolidation treatment with immunotherapy with Imfinzi before she underwent right middle and lower lobectomy with lymph node dissection under the care of Dr. Roxan Hockey. She is current on observation and she is feeling fine. The patient had repeat CT scan of the chest performed recently.  I personally and independently reviewed the scan and discussed the results with the patient today. Her scan showed no concerning findings for disease recurrence or metastasis. I recommended for her to continue on observation with repeat CT scan of the chest in 3 months. For the hypothyroidism, we will increase her dose of levothyroxine to 150 mcg p.o. daily.  The patient was strongly advised to take her medication as prescribed. She was advised to call immediately if she has any other concerning symptoms in the interval. The patient voices understanding of current disease status and treatment options and is in agreement with the current care plan.  All questions were answered. The patient knows to call the clinic with any problems, questions or concerns. We can certainly see the patient much sooner if necessary.  Disclaimer: This note was dictated with voice recognition software. Similar sounding words can inadvertently be transcribed and may not be corrected upon review.

## 2020-05-05 ENCOUNTER — Telehealth: Payer: Self-pay | Admitting: Internal Medicine

## 2020-05-05 NOTE — Telephone Encounter (Signed)
Scheduled appt per 12/27 los - mailed letter with appt date and time

## 2020-07-31 ENCOUNTER — Other Ambulatory Visit: Payer: Self-pay

## 2020-07-31 ENCOUNTER — Inpatient Hospital Stay: Payer: Medicare Other

## 2020-07-31 ENCOUNTER — Ambulatory Visit (HOSPITAL_COMMUNITY)
Admission: RE | Admit: 2020-07-31 | Discharge: 2020-07-31 | Disposition: A | Discharge status: Home / Self Care | Payer: Medicare Other | Source: Ambulatory Visit | Attending: Internal Medicine | Admitting: Internal Medicine

## 2020-07-31 DIAGNOSIS — I700 Atherosclerosis of aorta: Secondary | ICD-10-CM | POA: Diagnosis not present

## 2020-07-31 DIAGNOSIS — J439 Emphysema, unspecified: Secondary | ICD-10-CM | POA: Insufficient documentation

## 2020-07-31 DIAGNOSIS — E039 Hypothyroidism, unspecified: Secondary | ICD-10-CM | POA: Insufficient documentation

## 2020-07-31 DIAGNOSIS — C3490 Malignant neoplasm of unspecified part of unspecified bronchus or lung: Secondary | ICD-10-CM | POA: Insufficient documentation

## 2020-07-31 DIAGNOSIS — J90 Pleural effusion, not elsewhere classified: Secondary | ICD-10-CM | POA: Insufficient documentation

## 2020-07-31 LAB — CMP (CANCER CENTER ONLY)
ALT: 10 U/L (ref 0–44)
AST: 18 U/L (ref 15–41)
Albumin: 4.4 g/dL (ref 3.5–5.0)
Alkaline Phosphatase: 71 U/L (ref 38–126)
Anion gap: 10 (ref 5–15)
BUN: 16 mg/dL (ref 8–23)
CO2: 23 mmol/L (ref 22–32)
Calcium: 9.8 mg/dL (ref 8.9–10.3)
Chloride: 107 mmol/L (ref 98–111)
Creatinine: 1.14 mg/dL — ABNORMAL HIGH (ref 0.44–1.00)
GFR, Estimated: 53 mL/min — ABNORMAL LOW (ref >60)
Glucose, Bld: 107 mg/dL — ABNORMAL HIGH (ref 70–99)
Potassium: 3.6 mmol/L (ref 3.5–5.1)
Sodium: 140 mmol/L (ref 135–145)
Total Bilirubin: 1 mg/dL (ref 0.3–1.2)
Total Protein: 8.3 g/dL — ABNORMAL HIGH (ref 6.5–8.1)

## 2020-07-31 LAB — CBC WITH DIFFERENTIAL (CANCER CENTER ONLY)
Abs Immature Granulocytes: 0.01 10*3/uL (ref 0.00–0.07)
Basophils Absolute: 0 10*3/uL (ref 0.0–0.1)
Basophils Relative: 1 %
Eosinophils Absolute: 0 10*3/uL (ref 0.0–0.5)
Eosinophils Relative: 1 %
HCT: 38.6 % (ref 36.0–46.0)
Hemoglobin: 12.8 g/dL (ref 12.0–15.0)
Immature Granulocytes: 0 %
Lymphocytes Relative: 31 %
Lymphs Abs: 1.3 10*3/uL (ref 0.7–4.0)
MCH: 33.2 pg (ref 26.0–34.0)
MCHC: 33.2 g/dL (ref 30.0–36.0)
MCV: 100.3 fL — ABNORMAL HIGH (ref 80.0–100.0)
Monocytes Absolute: 0.4 10*3/uL (ref 0.1–1.0)
Monocytes Relative: 8 %
Neutro Abs: 2.5 10*3/uL (ref 1.7–7.7)
Neutrophils Relative %: 59 %
Platelet Count: 301 10*3/uL (ref 150–400)
RBC: 3.85 MIL/uL — ABNORMAL LOW (ref 3.87–5.11)
RDW: 13.2 % (ref 11.5–15.5)
WBC Count: 4.2 10*3/uL (ref 4.0–10.5)
nRBC: 0 % (ref 0.0–0.2)

## 2020-07-31 IMAGING — CT CT CHEST W/ CM
2 of 4 series · 15 of 36 positions shown, 18 images · IV contrast (OMNIPAQUE)
Comparison: [DATE]

CLINICAL DATA: Non-small cell lung cancer, status post lumpectomy,
chemotherapy, and XRT

EXAM:
CT CHEST WITH CONTRAST
TECHNIQUE: Multidetector CT imaging of the chest was performed during
intravenous contrast administration.
CONTRAST:  75mL OMNIPAQUE IOHEXOL 300 MG/ML  SOLN

[Series 2: axial st · axial · 0.81mm/px · z∈[+310,+510]mm · 12 of 120 slices shown, 15 images]
[im 10/120  mediastinal]
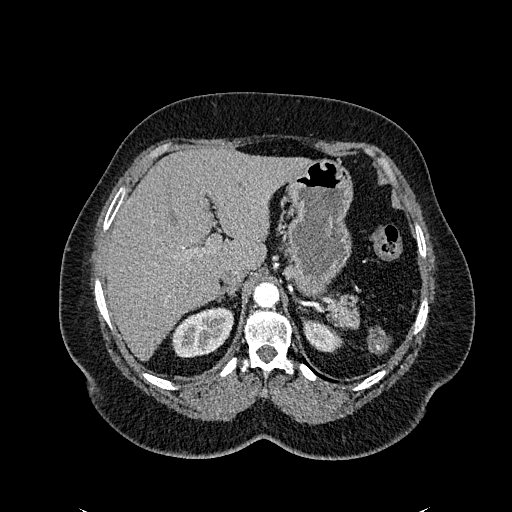
[im 10/120  lung]
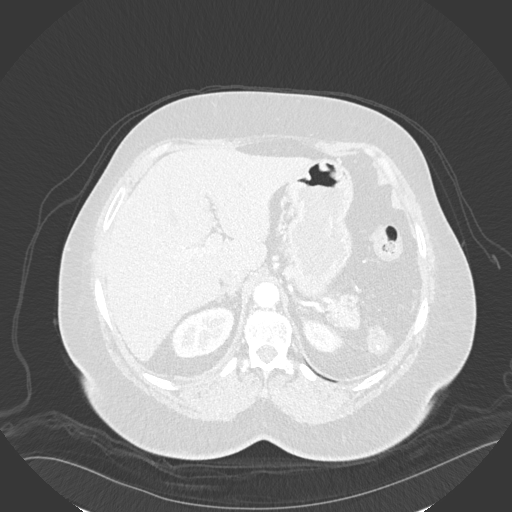
[im 19/120  lung]
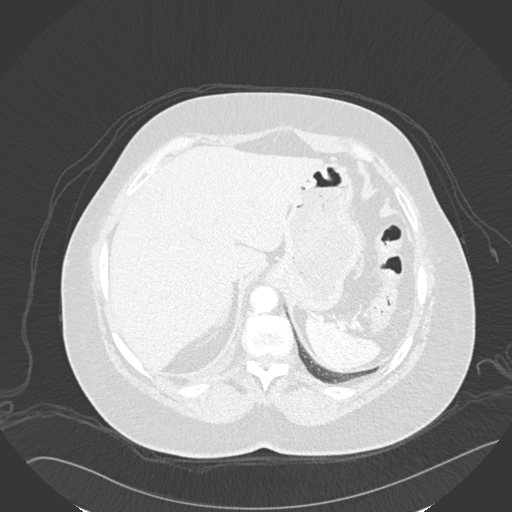
[im 28/120  lung]
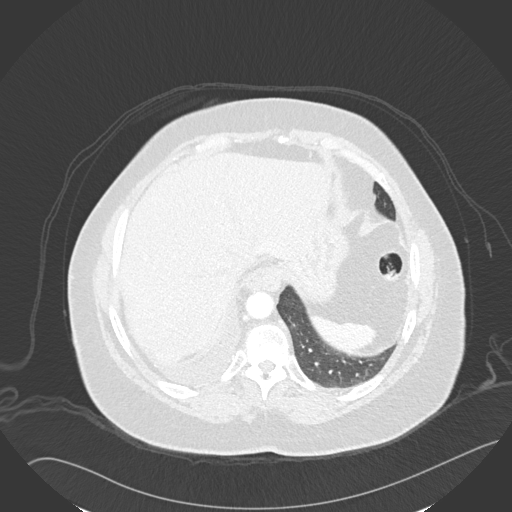
[im 37/120  lung]
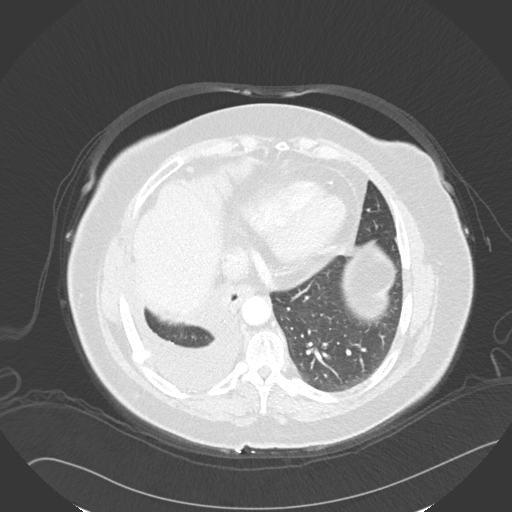
[im 46/120  mediastinal]
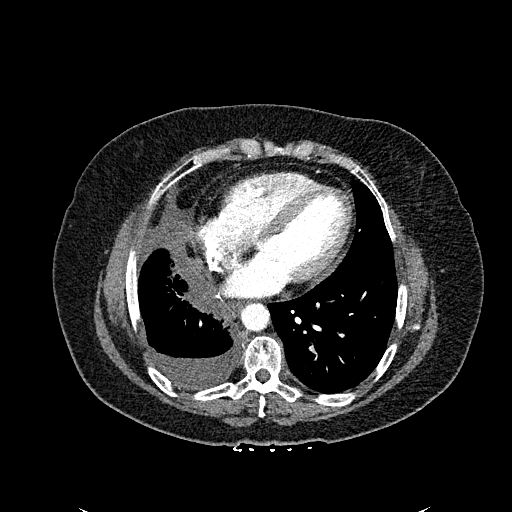
[im 46/120  lung]
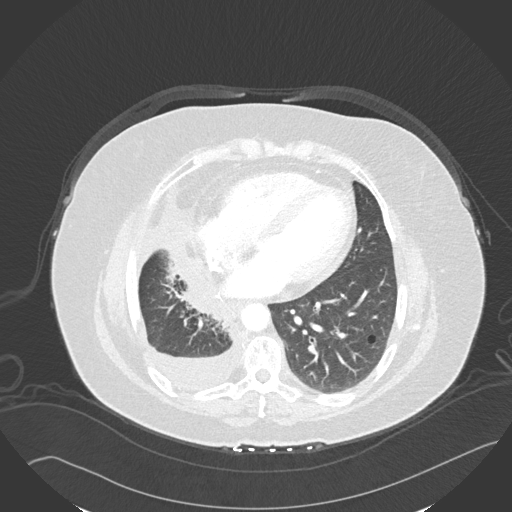
[im 55/120  lung]
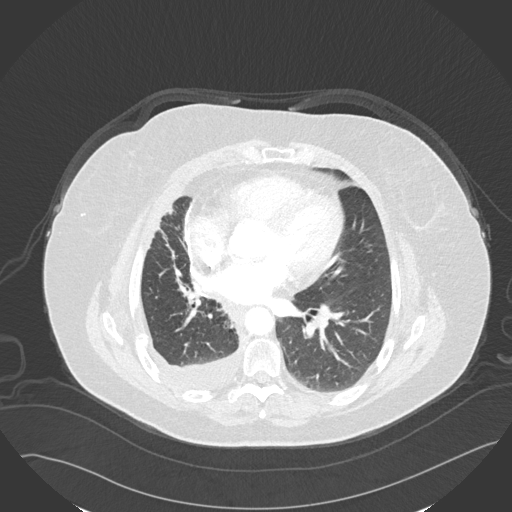
[im 65/120  lung]
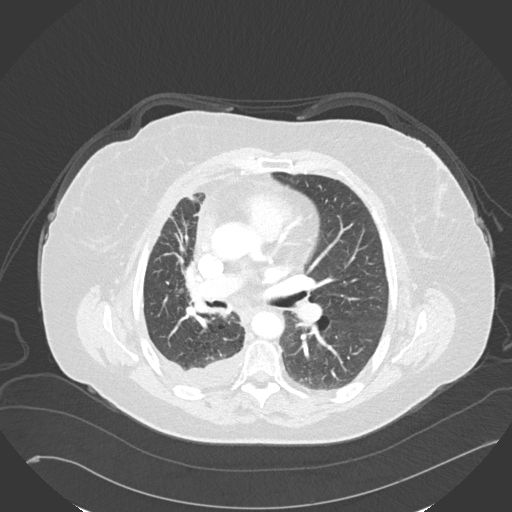
[im 74/120  lung]
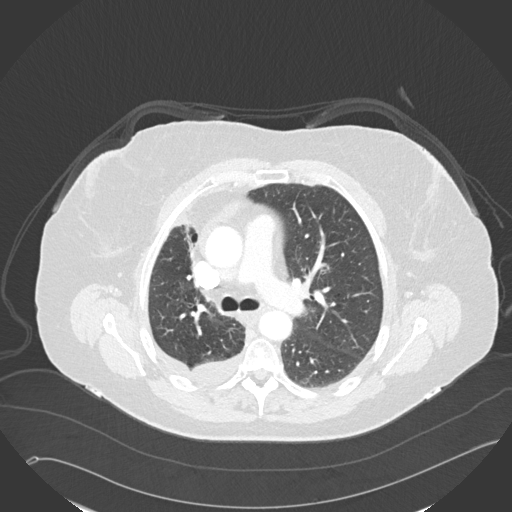
[im 83/120  mediastinal]
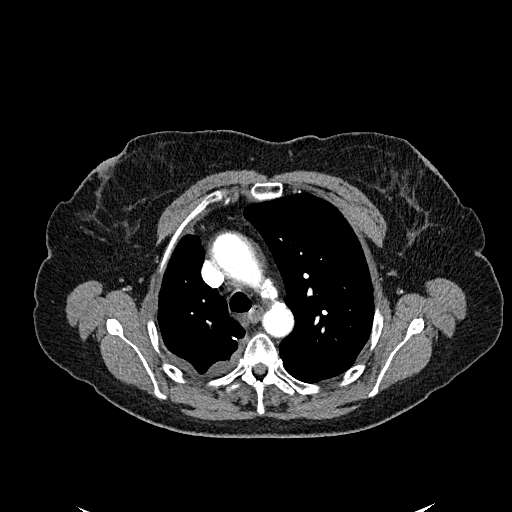
[im 83/120  lung]
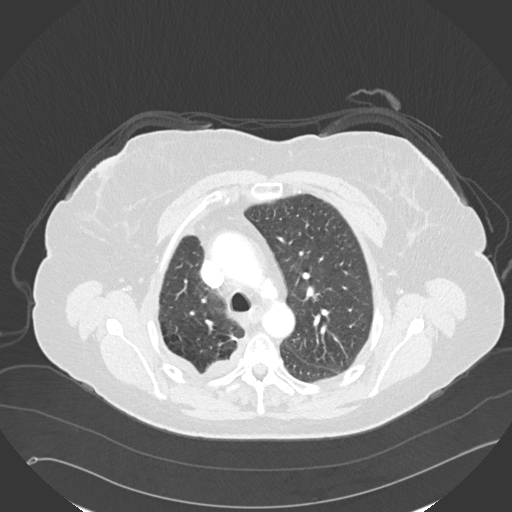
[im 92/120  lung]
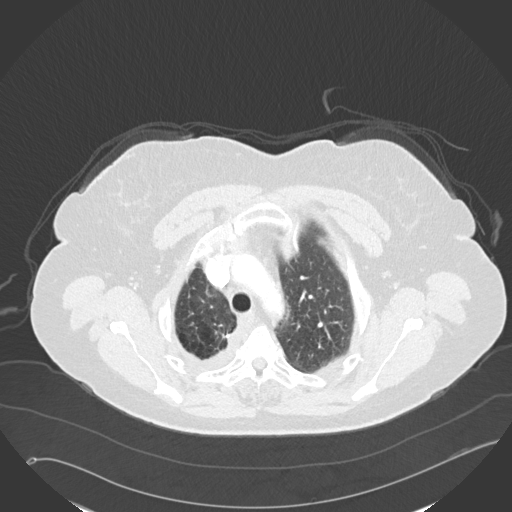
[im 101/120  lung]
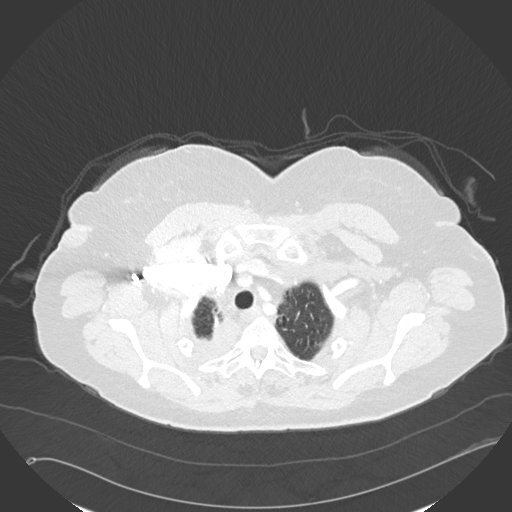
[im 110/120  lung]
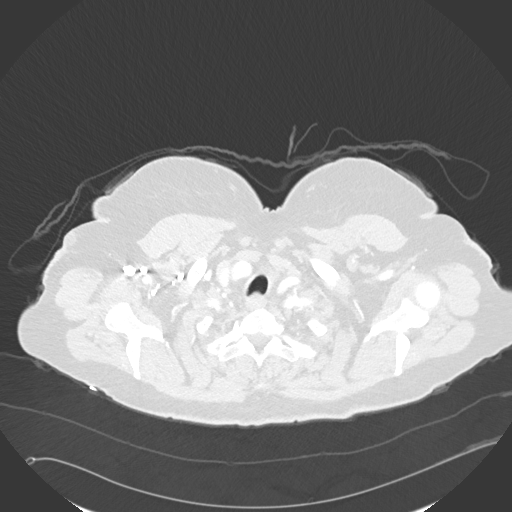

[Series 6: coronal · coronal · 0.55mm/px · 3 of 115 slices shown]
[im 23/115  lung]
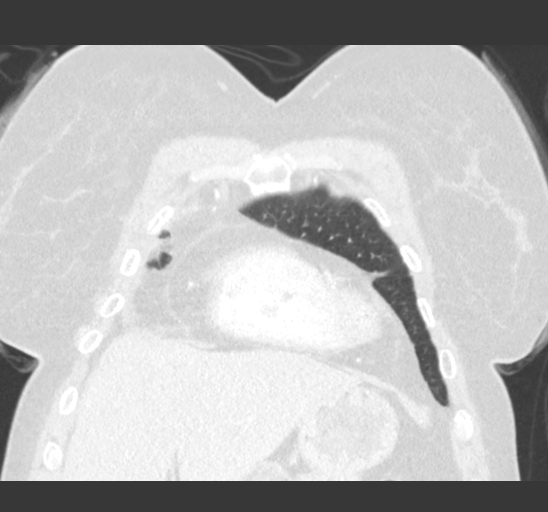
[im 46/115  lung]
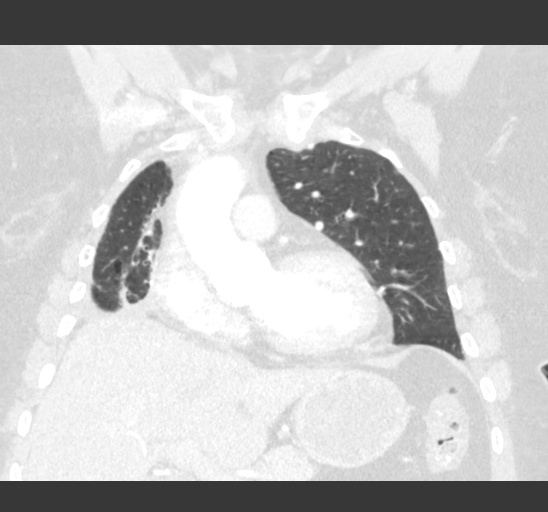
[im 69/115  lung]
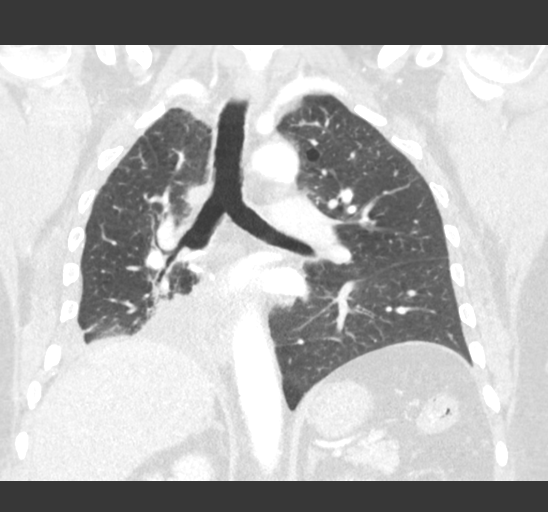

[15 of 36 positions shown; findings below may reference images not displayed]

FINDINGS: Cardiovascular: Heart is normal in size.  No pericardial effusion.

No evidence of thoracic aortic aneurysm. Atherosclerotic
calcifications of the aortic arch.

Mediastinum/Nodes: No suspicious mediastinal lymphadenopathy.

Visualized thyroid is unremarkable.

Lungs/Pleura: Status post right middle and lower lobectomy.

Radiation changes in the medial right lower hemithorax.

Small to moderate right pleural effusion, unchanged.

Mild centrilobular and paraseptal emphysematous changes, upper lung
predominant.

No suspicious pulmonary nodules.

No pneumothorax.

Upper Abdomen: Visualized upper abdomen is grossly unremarkable.

Musculoskeletal: Mild degenerative changes of the visualized
thoracolumbar spine. Right thoracotomy defects.

Sebaceous cysts along the right lateral chest wall/axilla (series
2/image 17).
IMPRESSION: Status post right middle and lower lobectomy. Radiation changes in
the medial right lower hemithorax.

Small to moderate right pleural effusion, unchanged.

No evidence of recurrent or metastatic disease.

Aortic Atherosclerosis ([7M]-[7M]) and Emphysema ([7M]-[7M]).

## 2020-07-31 MED ORDER — IOHEXOL 300 MG/ML  SOLN
75.0000 mL | Freq: Once | INTRAMUSCULAR | Status: AC | PRN
  Administered 2020-07-31: 75 mL via INTRAVENOUS

## 2020-08-03 ENCOUNTER — Inpatient Hospital Stay (HOSPITAL_BASED_OUTPATIENT_CLINIC_OR_DEPARTMENT_OTHER): Payer: Medicare Other | Admitting: Internal Medicine

## 2020-08-03 ENCOUNTER — Other Ambulatory Visit: Payer: Self-pay

## 2020-08-03 ENCOUNTER — Telehealth: Payer: Self-pay | Admitting: Internal Medicine

## 2020-08-03 VITALS — BP 117/80 | HR 95 | Temp 98.0°F | Resp 20 | Ht 64.0 in | Wt 209.0 lb

## 2020-08-03 DIAGNOSIS — C3490 Malignant neoplasm of unspecified part of unspecified bronchus or lung: Secondary | ICD-10-CM

## 2020-08-03 DIAGNOSIS — C342 Malignant neoplasm of middle lobe, bronchus or lung: Secondary | ICD-10-CM | POA: Diagnosis not present

## 2020-08-03 LAB — TSH: TSH: 113.771 u[IU]/mL — ABNORMAL HIGH (ref 0.308–3.960)

## 2020-08-03 NOTE — Telephone Encounter (Signed)
Scheduled follow-up appointments per 3/28 los. Patient is aware.

## 2020-08-03 NOTE — Progress Notes (Signed)
[facility] [phone]   Fax:(336) [phone]  OFFICE PROGRESS NOTE  Fulp, Cammie, MD (Inactive) No address on file  DIAGNOSIS: Stage IIIB (T3, M2, M0) non-small cell lung cancer, adenocarcinoma presented with right hilar mass with occlusion of the right middle lobe bronchus centrally with right middle lobe collapse diagnosed in January 2021.    PRIOR THERAPY: 1) Concurrent chemoradiation with weekly carboplatin for AUC of 2 and paclitaxel 45 mg/M2. First dose expected on July 09, 2019. Status post 8 cycles. Last dose was given Sep 09, 2019. 2) Consolidation treatment with immunotherapy with Imfinzi 1500 mg IV every 4 weeks. First dose 11/04/2019.Status post 2 cycles.Discontinued due to surgery 3) Robotic assisted right lower and middle lobectomy with lymph node dissection bilobectomy under the care of Dr. Roxan Hockey on 12/30/19. Pathology showed posttreatment T1, N0, stage Ia disease.  CURRENT THERAPY: Observation   INTERVAL HISTORY: Janice Adams 65 y.o. female returns to the clinic today for follow-up visit.  The patient is feeling fine today with no concerning complaints.  She denied having any current chest pain, shortness of breath except with exertion with no cough or hemoptysis.  She denied having any fatigue or weakness.  She gained few pounds since her last visit.  She denied having any headache or visual changes.  She has no nausea, vomiting, diarrhea or constipation.  She is here today for evaluation with repeat CT scan of the chest for restaging of her disease.  MEDICAL HISTORY: Past Medical History:  Diagnosis Date  . Back pain   . Cancer ([place])    Lung Cancer  . GERD (gastroesophageal reflux disease)   . Gout    no current problems per patient 05/31/19  . History of epigastric pain    comes and goes  . Hypertension    no meds    ALLERGIES:  has No Known Allergies.  MEDICATIONS:  Current Outpatient Medications   Medication Sig Dispense Refill  . acetaminophen (TYLENOL) 500 MG tablet Take 2 tablets (1,000 mg total) by mouth every 6 (six) hours. 30 tablet 0  . levothyroxine (SYNTHROID) 150 MCG tablet Take 1 tablet (150 mcg total) by mouth daily before breakfast. 30 tablet 2  . Multiple Vitamin (MULTIVITAMIN WITH MINERALS) TABS tablet Take 1 tablet by mouth daily.    Marland Kitchen omeprazole (PRILOSEC) 20 MG capsule TAKE ONE CAPSULE BY MOUTH TWICE DAILY BEFORE A MEAL TO LOWER STOMACH ACID 180 capsule 0  . traMADol (ULTRAM) 50 MG tablet Take 1 tablet (50 mg total) by mouth every 6 (six) hours as needed (mild pain). 28 tablet 0   No current facility-administered medications for this visit.    SURGICAL HISTORY:  Past Surgical History:  Procedure Laterality Date  . INTERCOSTAL NERVE BLOCK Right 12/30/2019   Procedure: INTERCOSTAL NERVE BLOCK;  Surgeon: Melrose Nakayama, MD;  Location: [facility];  Service: Thoracic;  Laterality: Right;  . LYMPH NODE DISSECTION Right 12/30/2019   Procedure: LYMPH NODE DISSECTION;  Surgeon: Melrose Nakayama, MD;  Location: [facility];  Service: Thoracic;  Laterality: Right;  . MULTIPLE TOOTH EXTRACTIONS    . TONSILLECTOMY    . uterine ablation    . VIDEO BRONCHOSCOPY N/A 12/30/2019   Procedure: VIDEO BRONCHOSCOPY;  Surgeon: Melrose Nakayama, MD;  Location: [facility];  Service: Thoracic;  Laterality: N/A;  . VIDEO BRONCHOSCOPY WITH ENDOBRONCHIAL ULTRASOUND N/A 06/04/2019   Procedure: VIDEO BRONCHOSCOPY WITH ENDOBRONCHIAL ULTRASOUND;  Surgeon: Margaretha Seeds, MD;  [facility]: [facility]  OR;  Service: Thoracic;  Laterality: N/A;  . WISDOM TOOTH EXTRACTION      REVIEW OF SYSTEMS:  A comprehensive review of systems was negative except for: Respiratory: positive for dyspnea on exertion   PHYSICAL EXAMINATION: General appearance: alert, cooperative and no distress Head: Normocephalic, without obvious abnormality, atraumatic Neck: no adenopathy, no JVD, supple, symmetrical, trachea midline and  thyroid not enlarged, symmetric, no tenderness/mass/nodules Lymph nodes: Cervical, supraclavicular, and axillary nodes normal. Resp: clear to auscultation bilaterally Back: symmetric, no curvature. ROM normal. No CVA tenderness. Cardio: regular rate and rhythm, S1, S2 normal, no murmur, click, rub or gallop GI: soft, non-tender; bowel sounds normal; no masses,  no organomegaly Extremities: extremities normal, atraumatic, no cyanosis or edema  ECOG PERFORMANCE STATUS: 1 - Symptomatic but completely ambulatory  Blood pressure 117/80, pulse 95, temperature 98 F (36.7 C), temperature source Tympanic, resp. rate 20, height 5\' 4"  (1.626 m), weight 209 lb (94.8 kg), SpO2 100 %.  LABORATORY DATA: Lab Results  Component Value Date   WBC 4.2 07/31/2020   HGB 12.8 07/31/2020   HCT 38.6 07/31/2020   MCV 100.3 (H) 07/31/2020   PLT 301 07/31/2020      Chemistry      Component Value Date/Time   NA 140 07/31/2020 1528   NA 146 (H) 03/06/2018 1128   K 3.6 07/31/2020 1528   CL 107 07/31/2020 1528   CO2 23 07/31/2020 1528   BUN 16 07/31/2020 1528   BUN 8 03/06/2018 1128   CREATININE 1.14 (H) 07/31/2020 1528   CREATININE 0.85 05/11/2016 1157      Component Value Date/Time   CALCIUM 9.8 07/31/2020 1528   ALKPHOS 71 07/31/2020 1528   AST 18 07/31/2020 1528   ALT 10 07/31/2020 1528   BILITOT 1.0 07/31/2020 1528       RADIOGRAPHIC STUDIES: CT Chest W Contrast  Result Date: 08/02/2020 CLINICAL DATA:  Non-small cell lung cancer, status post lumpectomy, chemotherapy, and XRT EXAM: CT CHEST WITH CONTRAST TECHNIQUE: Multidetector CT imaging of the chest was performed during intravenous contrast administration. CONTRAST:  35mL OMNIPAQUE IOHEXOL 300 MG/ML  SOLN COMPARISON:  04/24/2020 FINDINGS: Cardiovascular: Heart is normal in size.  No pericardial effusion. No evidence of thoracic aortic aneurysm. Atherosclerotic calcifications of the aortic arch. Mediastinum/Nodes: No suspicious mediastinal  lymphadenopathy. Visualized thyroid is unremarkable. Lungs/Pleura: Status post right middle and lower lobectomy. Radiation changes in the medial right lower hemithorax. Small to moderate right pleural effusion, unchanged. Mild centrilobular and paraseptal emphysematous changes, upper lung predominant. No suspicious pulmonary nodules. No pneumothorax. Upper Abdomen: Visualized upper abdomen is grossly unremarkable. Musculoskeletal: Mild degenerative changes of the visualized thoracolumbar spine. Right thoracotomy defects. Sebaceous cysts along the right lateral chest wall/axilla (series 2/image 17). IMPRESSION: Status post right middle and lower lobectomy. Radiation changes in the medial right lower hemithorax. Small to moderate right pleural effusion, unchanged. No evidence of recurrent or metastatic disease. Aortic Atherosclerosis (ICD10-I70.0) and Emphysema (ICD10-J43.9). Electronically Signed   By: Julian Hy M.D.   On: 08/02/2020 08:02    ASSESSMENT AND PLAN: This is a very pleasant 64 years old African-American female diagnosed with stage IIIb (T3, N2, M0) non-small cell lung cancer, adenocarcinoma presented with right hilar mass with occlusion of the right middle lobe bronchus as well as right middle lobe collapse in addition to right lower lobe pulmonary nodule diagnosed in January 2021. She completed a course of concurrent chemoradiation with weekly carboplatin and paclitaxel status post 8 cycles.   The patient received  2 cycles of consolidation treatment with immunotherapy with Imfinzi before she underwent right middle and lower lobectomy with lymph node dissection under the care of Dr. Roxan Hockey. The patient is currently on observation and she is feeling fine today with no concerning complaints. She had repeat CT scan of the chest performed recently.  I discussed the scan results with the patient.  Her scan showed no concerning findings for disease recurrence or metastasis. I will see her  back for follow-up visit in 6 months for evaluation with repeat CT scan of the chest. For the hypothyroidism, she will continue levothyroxine and follow-up with primary care physician. The patient was advised to call immediately if she has any concerning symptoms in the interval. The patient voices understanding of current disease status and treatment options and is in agreement with the current care plan.  All questions were answered. The patient knows to call the clinic with any problems, questions or concerns. We can certainly see the patient much sooner if necessary.  Disclaimer: This note was dictated with voice recognition software. Similar sounding words can inadvertently be transcribed and may not be corrected upon review.

## 2020-08-06 ENCOUNTER — Ambulatory Visit (HOSPITAL_COMMUNITY)
Admission: RE | Admit: 2020-08-06 | Discharge: 2020-08-06 | Disposition: A | Discharge status: Home / Self Care | Payer: Medicare Other | Source: Ambulatory Visit | Attending: Thoracic Surgery (Cardiothoracic Vascular Surgery) | Admitting: Thoracic Surgery (Cardiothoracic Vascular Surgery)

## 2020-08-06 ENCOUNTER — Other Ambulatory Visit: Payer: Self-pay

## 2020-08-06 DIAGNOSIS — C3491 Malignant neoplasm of unspecified part of right bronchus or lung: Secondary | ICD-10-CM | POA: Insufficient documentation

## 2020-08-06 IMAGING — MR MR HEAD WO/W CM
14 series · 48 of 48 positions shown · IV contrast (gadavist)
Comparison: None.

CLINICAL DATA: Non-small cell lung carcinoma. Assessment for
metastatic disease.

EXAM:
MRI HEAD WITHOUT AND WITH CONTRAST
TECHNIQUE: Multiplanar, multiecho pulse sequences of the brain and surrounding
structures were obtained without and with intravenous contrast.
CONTRAST:  9mL GADAVIST GADOBUTROL 1 MMOL/ML IV SOLN

[Series 5: DWI · axial · 3.0mm · 1.36mm/px · z∈[-72,+74]mm · 5 of 100 slices shown (1 of 2)]
[im 1/100]
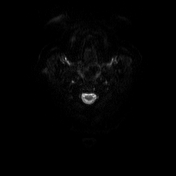
[im 25/100]
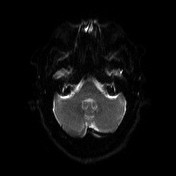
[im 50/100]
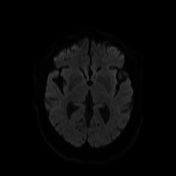
[im 75/100]
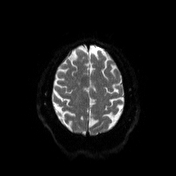
[im 100/100]
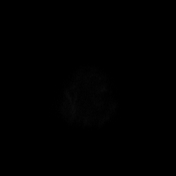

[Series 6: DWI · axial · 3.0mm · 1.36mm/px · z∈[-72,+74]mm · 3 of 50 slices shown (2 of 2)]
[im 1/50]
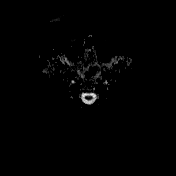
[im 25/50]
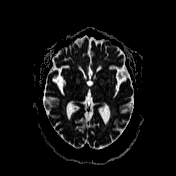
[im 50/50]
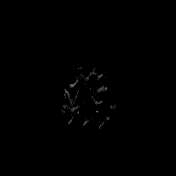

[Series 7: T1 · sagittal · 5.0mm · 0.75mm/px · 1 of 24 slices shown (1 of 2)]
[im 1/24]
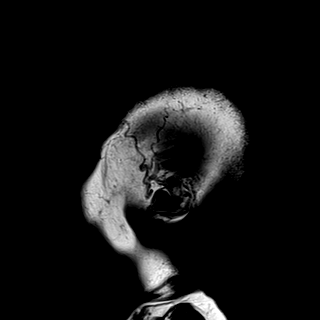

[Series 8: T2 · axial · 5.0mm · 0.62mm/px · z∈[-77,+84]mm · 2 of 26 slices shown]
[im 1/26]
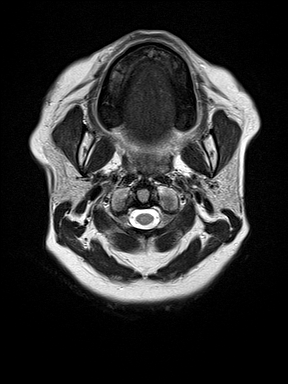
[im 26/26]
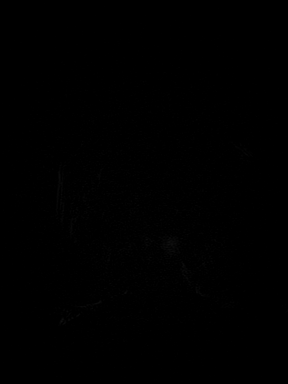

[Series 9: mip_images(sw) · axial · 24.0mm · 0.75mm/px · z∈[-61,+69]mm · 3 of 45 slices shown]
[im 1/45]
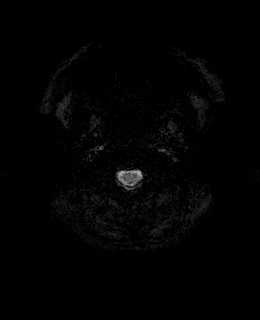
[im 23/45]
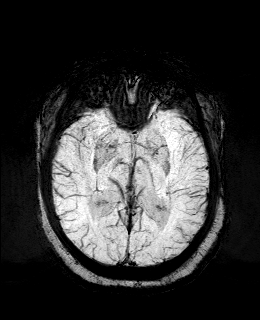
[im 45/45]
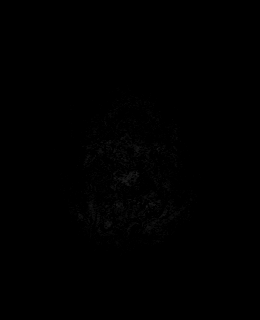

[Series 10: swi_images · axial · 3.0mm · 0.75mm/px · z∈[-72,+80]mm · 3 of 52 slices shown]
[im 1/52]
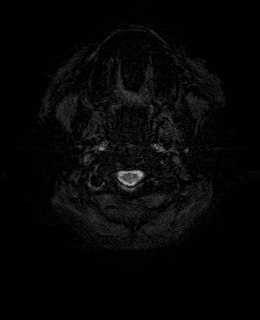
[im 26/52]
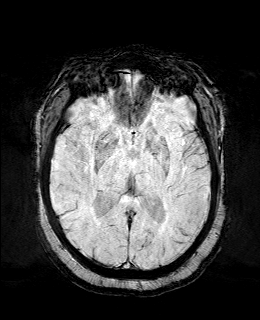
[im 52/52]
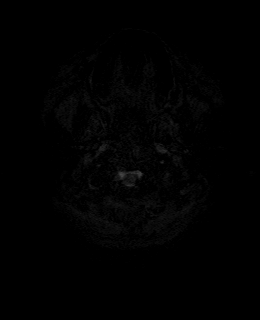

[Series 11: FLAIR · axial · 3.0mm · 0.75mm/px · z∈[-72,+80]mm · 3 of 52 slices shown]
[im 1/52]
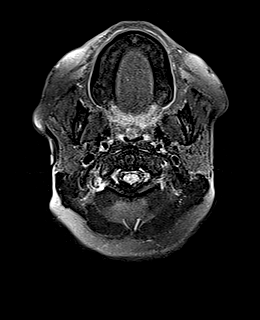
[im 26/52]
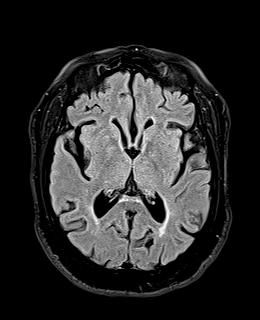
[im 52/52]
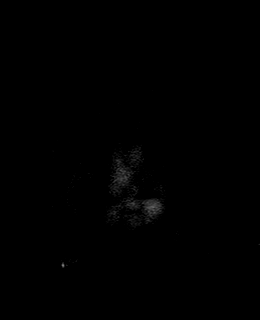

[Series 12: T1 · axial · 1.0mm · 0.94mm/px · z∈[-76,+81]mm · 9 of 160 slices shown (2 of 2)]
[im 1/160]
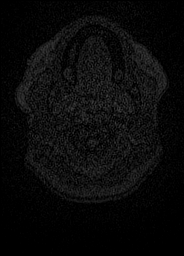
[im 20/160]
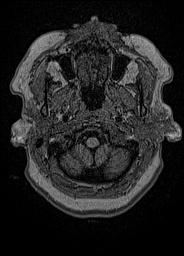
[im 40/160]
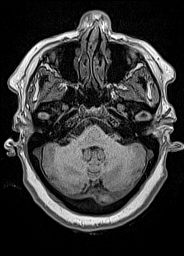
[im 60/160]
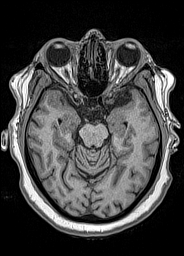
[im 80/160]
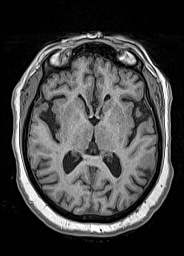
[im 100/160]
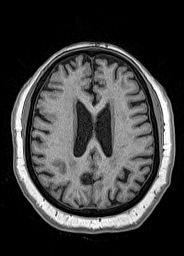
[im 120/160]
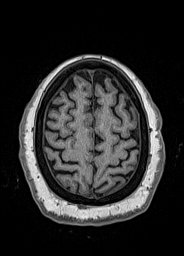
[im 140/160]
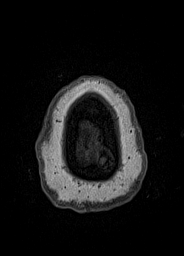
[im 160/160]
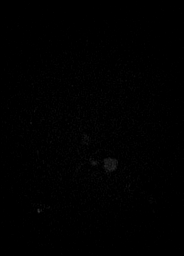

[Series 13: cor dwi_tracew · coronal · 5.0mm · 1.53mm/px · 3 of 52 slices shown]
[im 1/52]
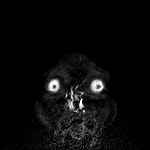
[im 26/52]
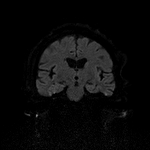
[im 52/52]
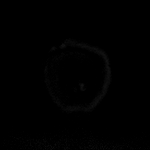

[Series 14: cor dwi_adc · coronal · 5.0mm · 1.53mm/px · 2 of 26 slices shown]
[im 1/26]
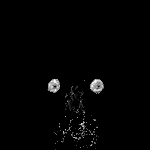
[im 26/26]
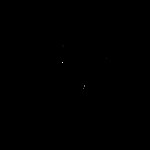

[Series 15: T2 post-contrast · coronal · 5.0mm · 0.57mm/px · 2 of 29 slices shown]
[im 1/29]
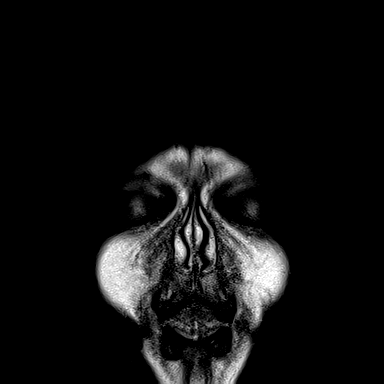
[im 29/29]
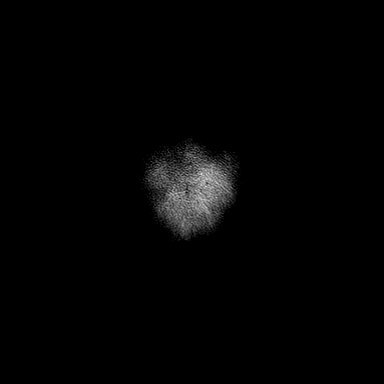

[Series 16: T1 post-contrast · axial · 1.0mm · 0.94mm/px · z∈[-76,+81]mm · 9 of 160 slices shown (1 of 3)]
[im 1/160]
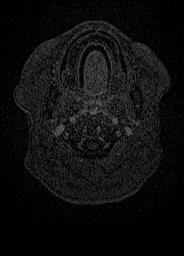
[im 20/160]
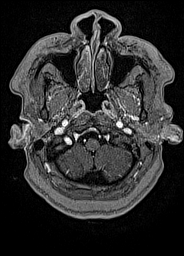
[im 40/160]
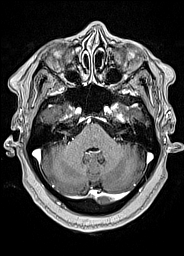
[im 60/160]
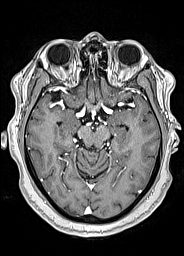
[im 80/160]
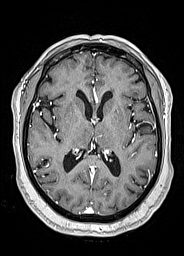
[im 100/160]
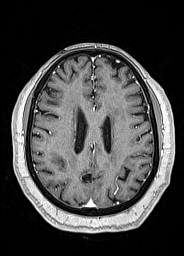
[im 120/160]
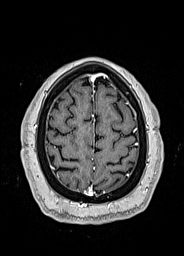
[im 140/160]
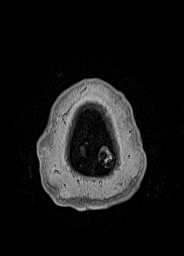
[im 160/160]
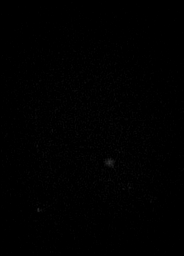

[Series 17: T1 post-contrast · coronal · 5.0mm · 0.43mm/px · 2 of 29 slices shown (2 of 3)]
[im 1/29]
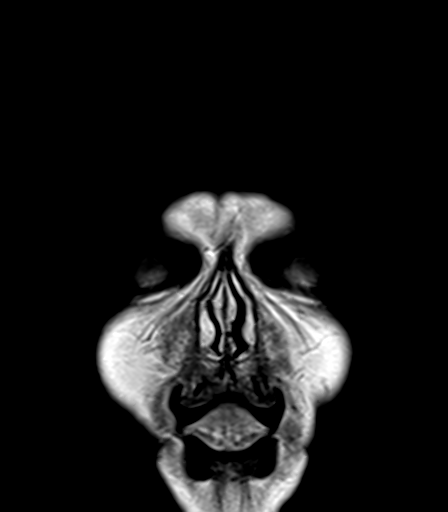
[im 29/29]
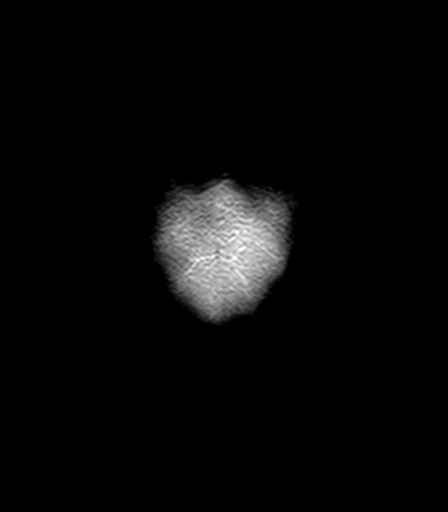

[Series 18: T1 post-contrast · sagittal · 5.0mm · 0.75mm/px · 1 of 24 slices shown (3 of 3)]
[im 1/24]
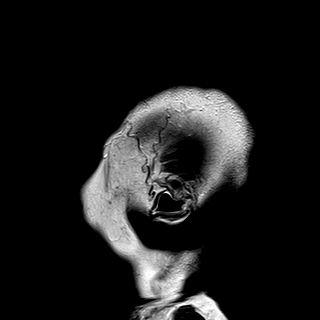

[48 of 48 positions shown; findings below may reference images not displayed]

FINDINGS: Brain: No acute infarct, mass effect or extra-axial collection. No
acute or chronic hemorrhage. There is multifocal hyperintense
T2-weighted signal within the white matter. Generalized volume loss
without a clear lobar predilection. The midline structures are
normal.

Vascular: Major flow voids are preserved.

Skull and upper cervical spine: Normal calvarium and skull base.
Visualized upper cervical spine and soft tissues are normal.

Sinuses/Orbits:No paranasal sinus fluid levels or advanced mucosal
thickening. No mastoid or middle ear effusion. Normal orbits.
IMPRESSION: 1. No intracranial metastatic disease.
2. Mild chronic microvascular disease and generalized volume loss.

## 2020-08-06 MED ORDER — GADOBUTROL 1 MMOL/ML IV SOLN
9.0000 mL | Freq: Once | INTRAVENOUS | Status: AC | PRN
  Administered 2020-08-06: 9 mL via INTRAVENOUS

## 2020-10-08 ENCOUNTER — Encounter: Payer: Self-pay | Admitting: Internal Medicine

## 2020-10-15 ENCOUNTER — Ambulatory Visit: Payer: Medicare Other

## 2020-12-10 ENCOUNTER — Ambulatory Visit: Payer: Medicare Other | Admitting: Family Medicine

## 2020-12-10 ENCOUNTER — Encounter: Payer: Self-pay | Admitting: Internal Medicine

## 2021-01-08 ENCOUNTER — Other Ambulatory Visit: Payer: Self-pay | Admitting: Thoracic Surgery (Cardiothoracic Vascular Surgery)

## 2021-01-08 DIAGNOSIS — C342 Malignant neoplasm of middle lobe, bronchus or lung: Secondary | ICD-10-CM

## 2021-01-12 ENCOUNTER — Ambulatory Visit (INDEPENDENT_AMBULATORY_CARE_PROVIDER_SITE_OTHER): Payer: Medicare Other | Admitting: Thoracic Surgery (Cardiothoracic Vascular Surgery)

## 2021-01-12 ENCOUNTER — Ambulatory Visit
Admission: RE | Admit: 2021-01-12 | Discharge: 2021-01-12 | Disposition: A | Discharge status: Home / Self Care | Payer: Medicare Other | Source: Ambulatory Visit | Attending: Thoracic Surgery (Cardiothoracic Vascular Surgery) | Admitting: Thoracic Surgery (Cardiothoracic Vascular Surgery)

## 2021-01-12 ENCOUNTER — Other Ambulatory Visit: Payer: Self-pay

## 2021-01-12 VITALS — BP 116/80 | HR 106 | Resp 20 | Ht 64.0 in | Wt 214.0 lb

## 2021-01-12 DIAGNOSIS — C342 Malignant neoplasm of middle lobe, bronchus or lung: Secondary | ICD-10-CM

## 2021-01-12 DIAGNOSIS — C3491 Malignant neoplasm of unspecified part of right bronchus or lung: Secondary | ICD-10-CM

## 2021-01-12 DIAGNOSIS — J90 Pleural effusion, not elsewhere classified: Secondary | ICD-10-CM | POA: Diagnosis not present

## 2021-01-12 IMAGING — CR DG CHEST 2V
2 series · 2 of 2 positions shown · non-contrast
Comparison: [DATE].

CLINICAL DATA: Lung cancer.

EXAM:
CHEST - 2 VIEW

[w chest pa]
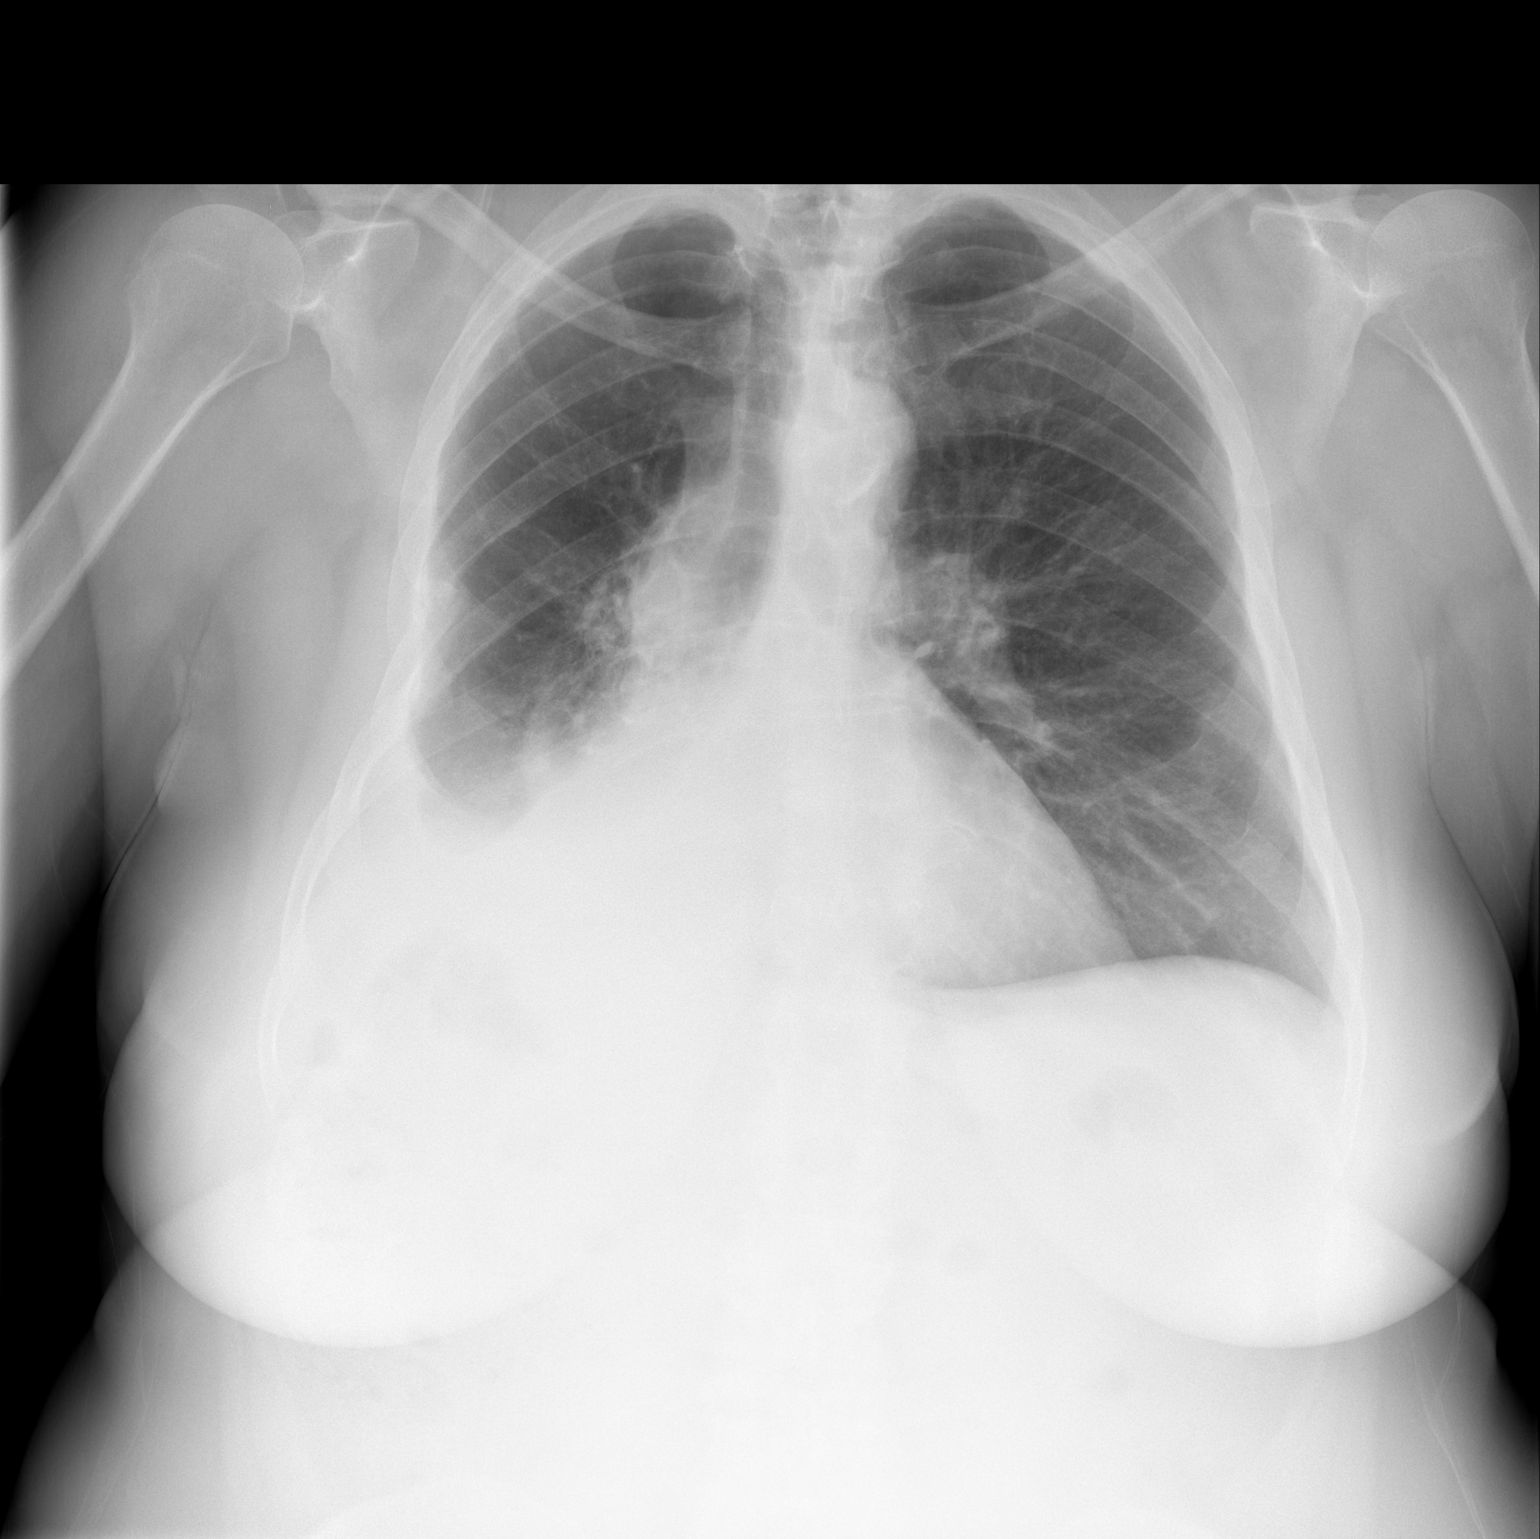

[w chest lat]
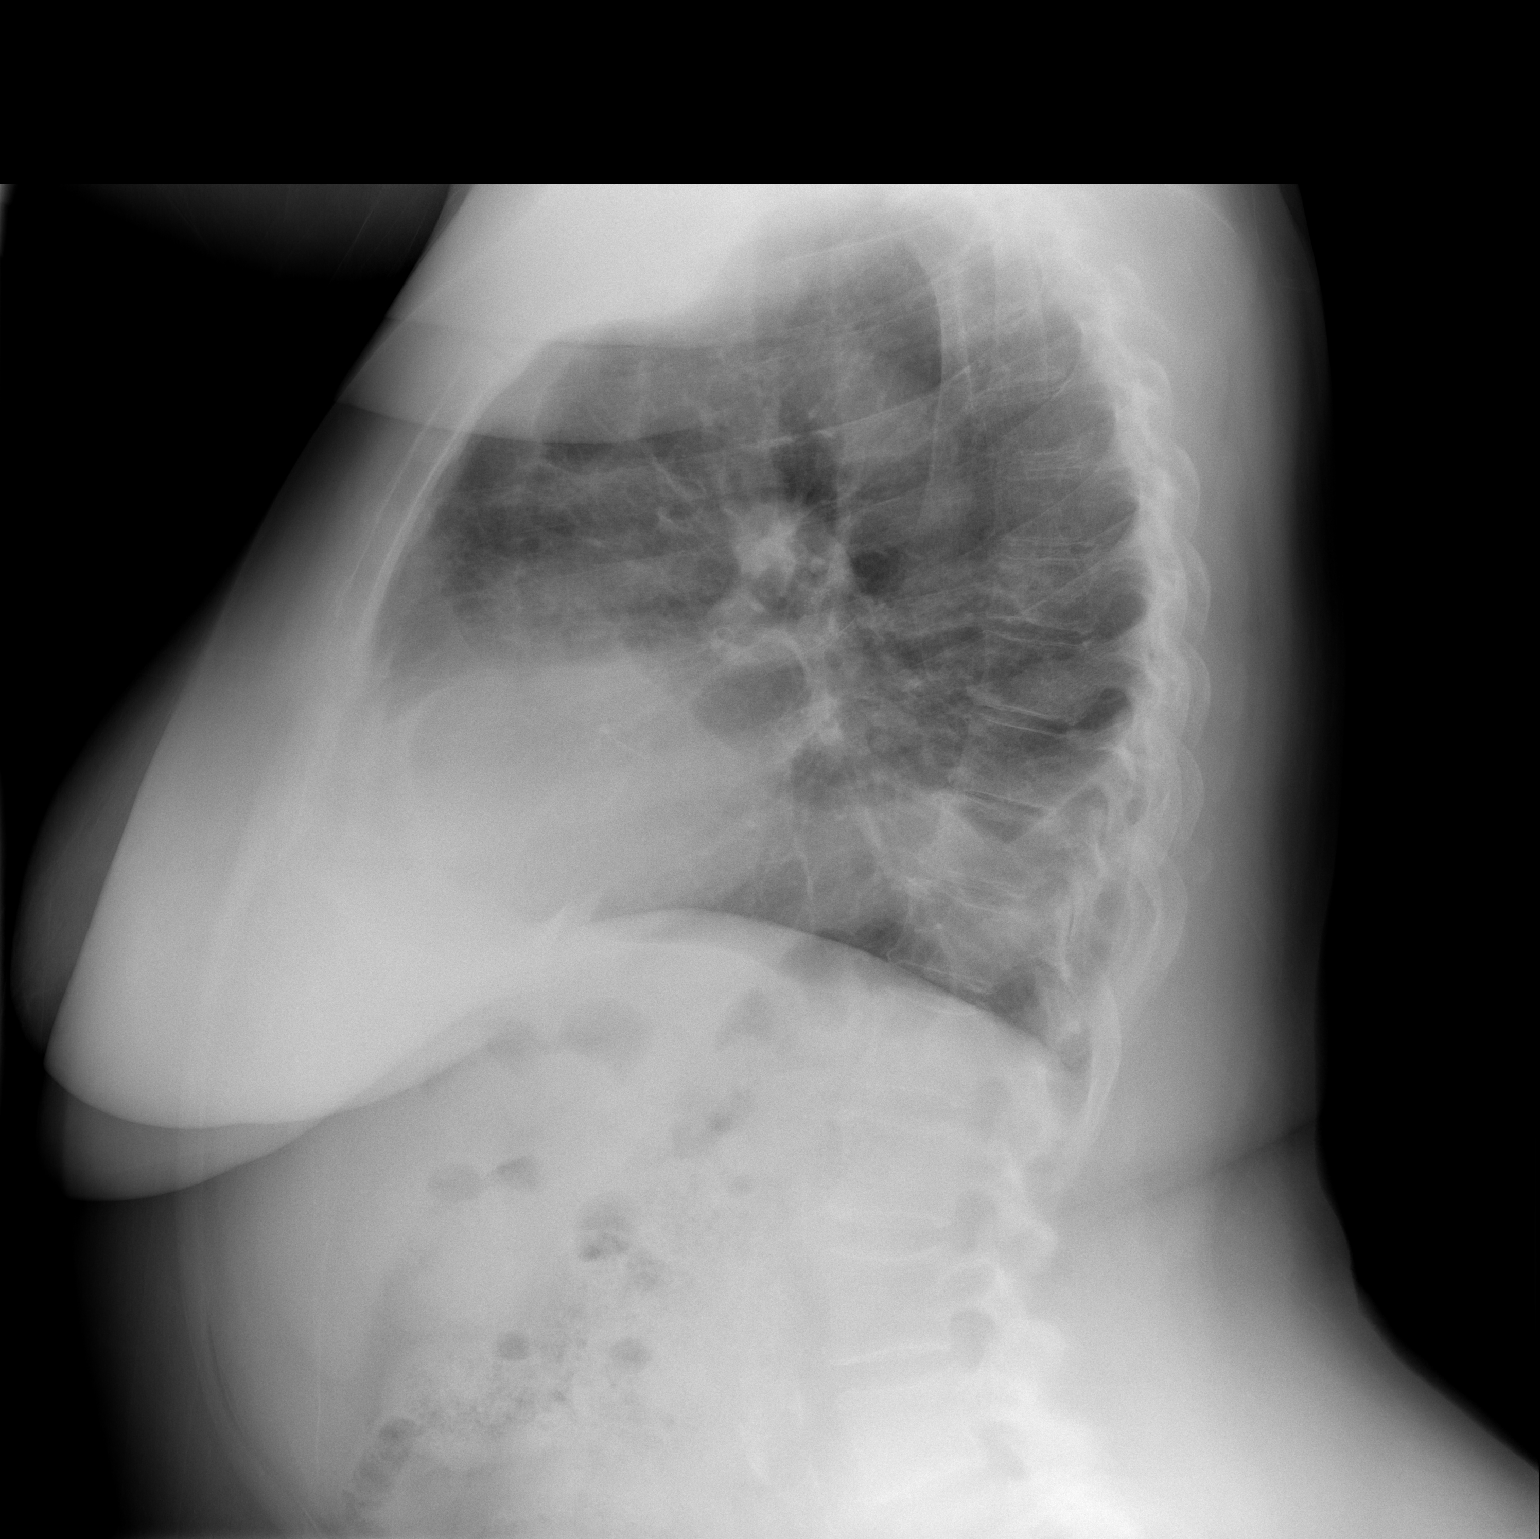

[2 of 2 positions shown; findings below may reference images not displayed]

FINDINGS: The heart size and mediastinal contours are within normal limits.
Pneumothorax is noted. Left lung is clear. Small right pleural
effusion is noted with associated right basilar atelectasis or
infiltrate. The visualized skeletal structures are unremarkable.
IMPRESSION: Stable small right pleural effusion with associated right basilar
atelectasis or infiltrate.

## 2021-01-12 NOTE — Progress Notes (Signed)
[address]       [place] [address]             [phone]       HPI: [name] returns for a 1 year follow-up after right lower and middle bilobectomy.  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  She was treated with neoadjuvant chemo and radiation with a partial radiographic response.  I did a robotic right lower and middle bilobectomy on 12/30/2019.  Her postoperative course was uncomplicated.  Her posttreatment staging was pyT1, N0.    She feels well.  She has some discomfort in the area then incision.  This is mostly numbness with occasional vague pain.  She is not having to take anything for that.  She has quit smoking altogether.  Past Medical History:  Diagnosis Date   Back pain    Cancer ([place])    Lung Cancer   GERD (gastroesophageal reflux disease)    Gout    no current problems per patient 05/31/19   History of epigastric pain    comes and goes   Hypertension    no meds    Current Outpatient Medications  Medication Sig Dispense Refill   acetaminophen (TYLENOL) 500 MG tablet Take 2 tablets (1,000 mg total) by mouth every 6 (six) hours. 30 tablet 0   levothyroxine (SYNTHROID) 150 MCG tablet Take 1 tablet (150 mcg total) by mouth daily before breakfast. 30 tablet 2   Multiple Vitamin (MULTIVITAMIN WITH MINERALS) TABS tablet Take 1 tablet by mouth daily.     omeprazole (PRILOSEC) 20 MG capsule TAKE ONE CAPSULE BY MOUTH TWICE DAILY BEFORE A MEAL TO LOWER STOMACH ACID 180 capsule 0   traMADol (ULTRAM) 50 MG tablet Take 1 tablet (50 mg total) by mouth every 6 (six) hours as needed (mild pain). 28 tablet 0   No current facility-administered medications for this visit.    Physical Exam BP 116/80 (BP Location: Left Arm, Patient Position: Sitting)   Pulse (!) 106   Resp 20   Ht 5\' 4"  (1.626 m)   Wt 214 lb (97.1 kg)   LMP  (LMP  Unknown)   SpO2 93% Comment: RA  BMI 36.41 kg/m  65 year old woman in no acute distress Alert and oriented x3 with no focal deficits Lungs absent breath sounds at right base but otherwise clear Incisions well-healed No cervical supraclavicular adenopathy Cardiac regular rate and rhythm  Diagnostic Tests: CHEST - 2 VIEW   COMPARISON:  April 14, 2020.   FINDINGS: The heart size and mediastinal contours are within normal limits. Pneumothorax is noted. Left lung is clear. Small right pleural effusion is noted with associated right basilar atelectasis or infiltrate. The visualized skeletal structures are unremarkable.   IMPRESSION: Stable small right pleural effusion with associated right basilar atelectasis or infiltrate.     Electronically Signed   By: Marijo Conception M.D.   On: 01/12/2021 13:14 I personally reviewed the chest x-ray.  Shows postoperative changes and is unchanged from December.  Impression: Janice Adams is a 65 year old woman with history of tobacco abuse who was diagnosed with a stage IIb adenocarcinoma of the lung about a year and a half ago.  She underwent neoadjuvant chemoradiation followed by robotic right lower and middle bilobectomy.  Her posttreatment staging was pyT1N0.  She is now a year out from surgery.  She is doing well.  She does still have some discomfort from her incision but is not anything that concerns her.  She is not having to take any pain medication for that.    History of tobacco abuse-has quit smoking completely.  I congratulated her for that.  Plan: Follow-up with Dr. Julien Nordmann as scheduled I will be happy to see her back anytime in the future if I can be of any further assistance with her care  Melrose Nakayama, MD [facility] [phone]

## 2021-02-01 ENCOUNTER — Inpatient Hospital Stay: Payer: Medicare Other | Attending: Internal Medicine

## 2021-02-01 ENCOUNTER — Other Ambulatory Visit: Payer: Medicare Other

## 2021-02-01 DIAGNOSIS — C3411 Malignant neoplasm of upper lobe, right bronchus or lung: Secondary | ICD-10-CM | POA: Insufficient documentation

## 2021-02-01 DIAGNOSIS — Z79899 Other long term (current) drug therapy: Secondary | ICD-10-CM | POA: Insufficient documentation

## 2021-02-01 DIAGNOSIS — E039 Hypothyroidism, unspecified: Secondary | ICD-10-CM | POA: Insufficient documentation

## 2021-02-02 ENCOUNTER — Ambulatory Visit (HOSPITAL_COMMUNITY)
Admission: RE | Admit: 2021-02-02 | Discharge: 2021-02-02 | Disposition: A | Discharge status: Home / Self Care | Payer: Medicare Other | Source: Ambulatory Visit | Attending: Internal Medicine | Admitting: Internal Medicine

## 2021-02-02 ENCOUNTER — Other Ambulatory Visit: Payer: Self-pay

## 2021-02-02 DIAGNOSIS — J90 Pleural effusion, not elsewhere classified: Secondary | ICD-10-CM | POA: Diagnosis not present

## 2021-02-02 DIAGNOSIS — I700 Atherosclerosis of aorta: Secondary | ICD-10-CM | POA: Diagnosis not present

## 2021-02-02 DIAGNOSIS — C3490 Malignant neoplasm of unspecified part of unspecified bronchus or lung: Secondary | ICD-10-CM | POA: Diagnosis not present

## 2021-02-02 DIAGNOSIS — J439 Emphysema, unspecified: Secondary | ICD-10-CM | POA: Diagnosis not present

## 2021-02-02 LAB — POCT I-STAT CREATININE: Creatinine, Ser: 1.4 mg/dL — ABNORMAL HIGH (ref 0.44–1.00)

## 2021-02-02 IMAGING — CT CT CHEST W/ CM
2 of 4 series · 15 of 36 positions shown, 18 images · IV contrast (omnipaque)
Comparison: Chest CT [DATE].

CLINICAL DATA: 64-year-old female with history of non-small cell
lung cancer. Staging examination.

EXAM:
CT CHEST WITH CONTRAST
TECHNIQUE: Multidetector CT imaging of the chest was performed during
intravenous contrast administration.
CONTRAST:  80mL OMNIPAQUE IOHEXOL 350 MG/ML SOLN

[Series 2: axial st · axial · 0.68mm/px · z∈[+94,+302]mm · 12 of 124 slices shown, 15 images]
[im 10/124  mediastinal]
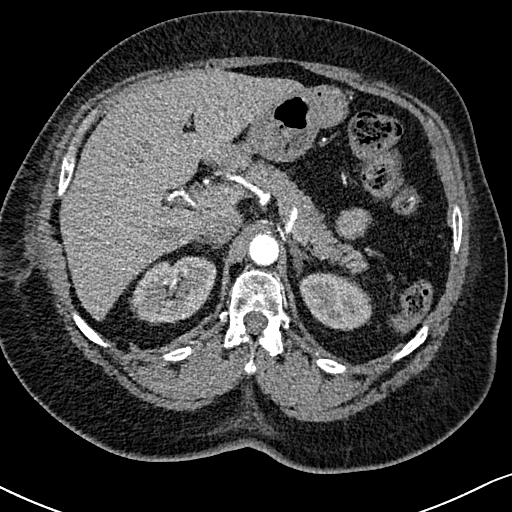
[im 10/124  lung]
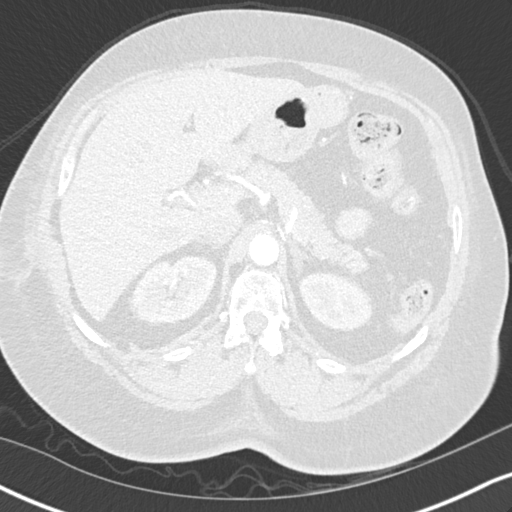
[im 19/124  lung]
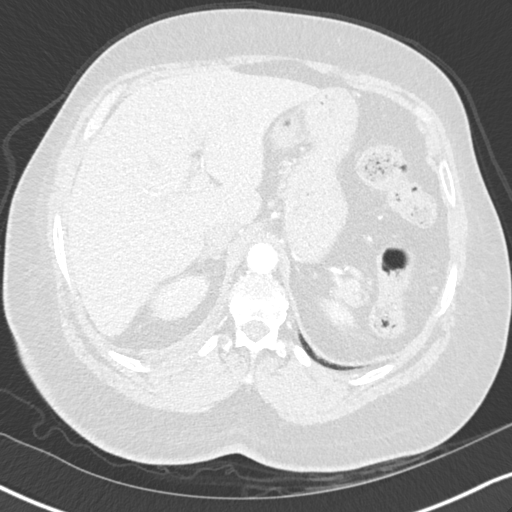
[im 29/124  lung]
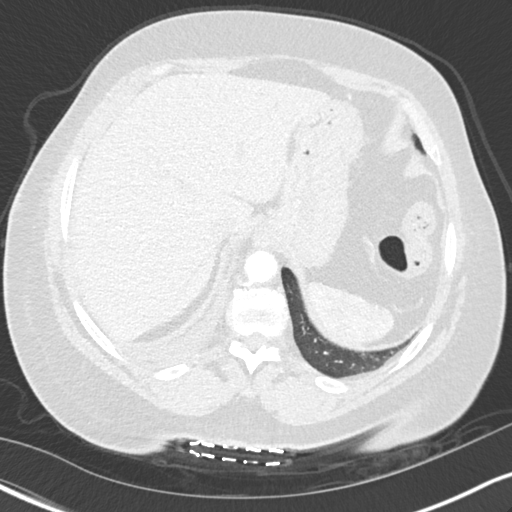
[im 38/124  lung]
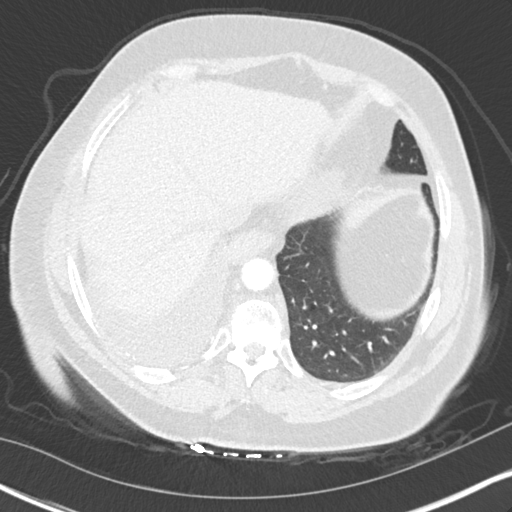
[im 48/124  mediastinal]
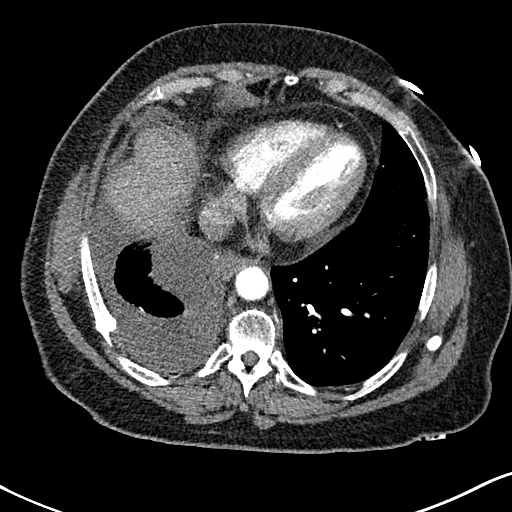
[im 48/124  lung]
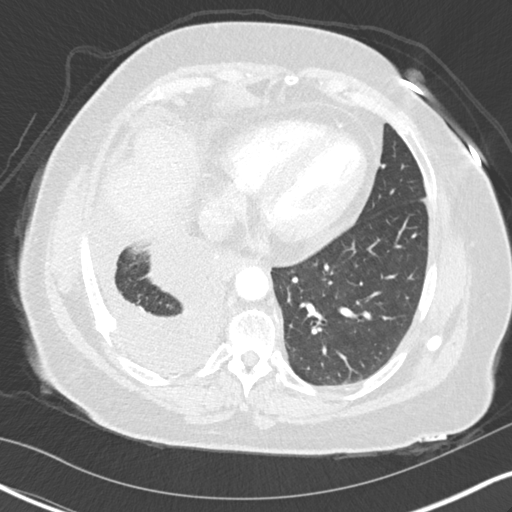
[im 57/124  lung]
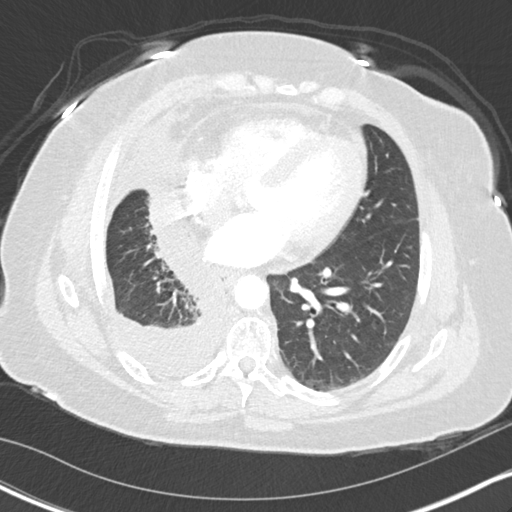
[im 67/124  lung]
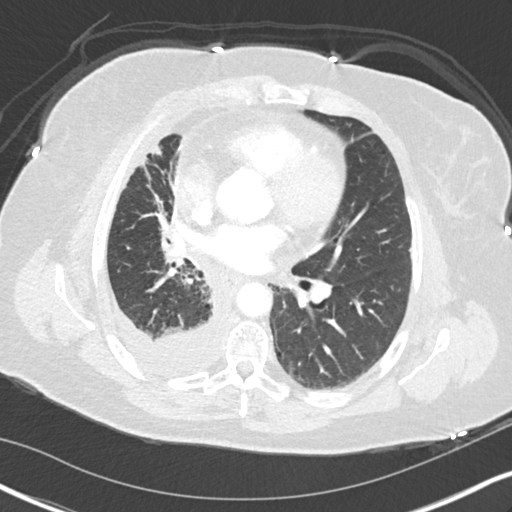
[im 76/124  lung]
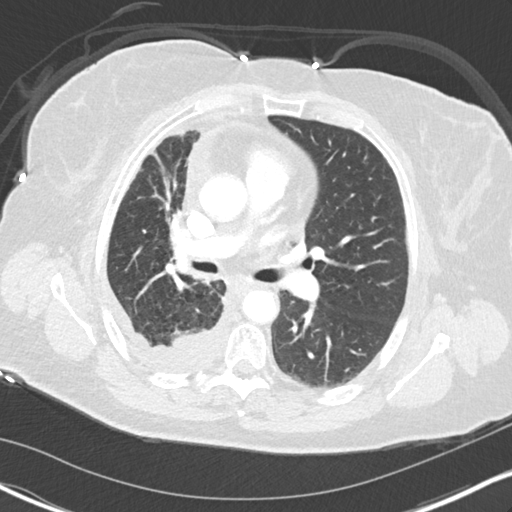
[im 86/124  mediastinal]
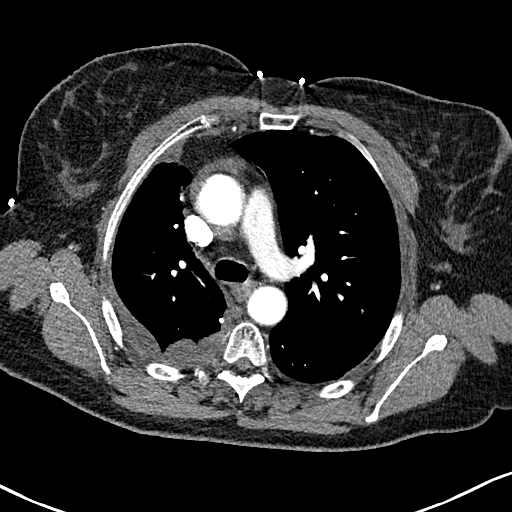
[im 86/124  lung]
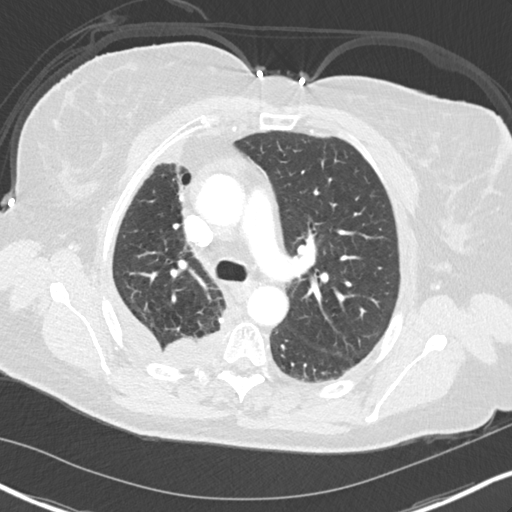
[im 95/124  lung]
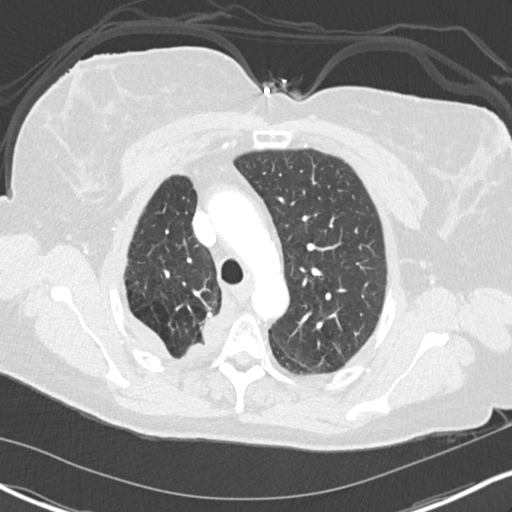
[im 105/124  lung]
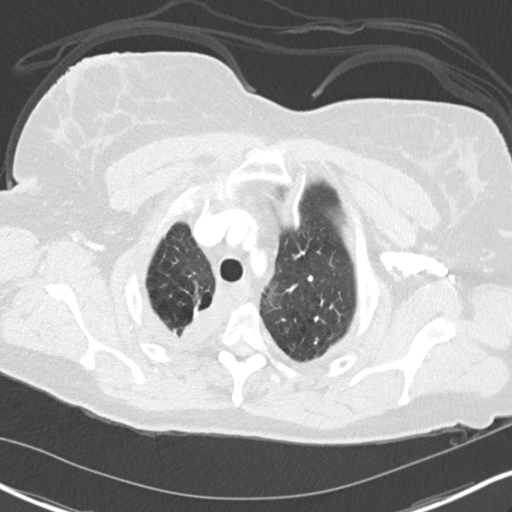
[im 114/124  lung]
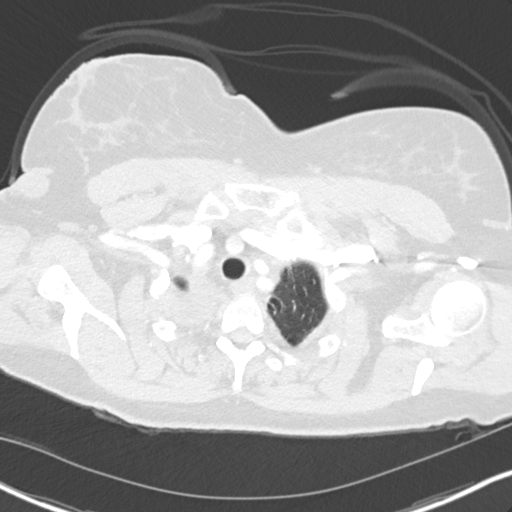

[Series 5: coronal · coronal · 0.53mm/px · 3 of 145 slices shown]
[im 29/145  lung]
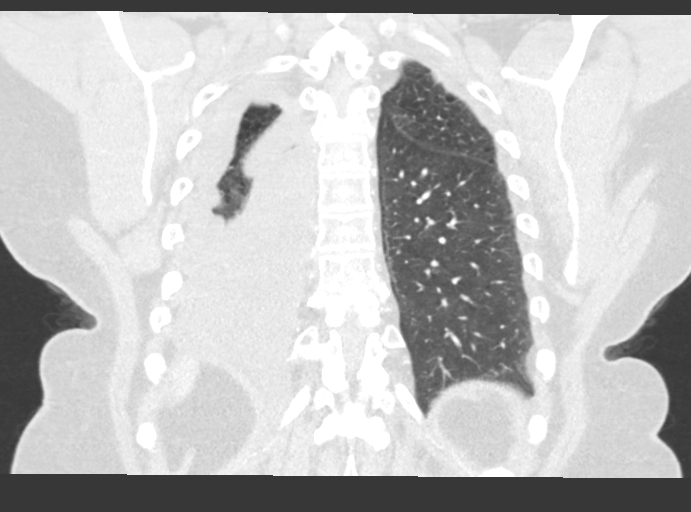
[im 58/145  lung]
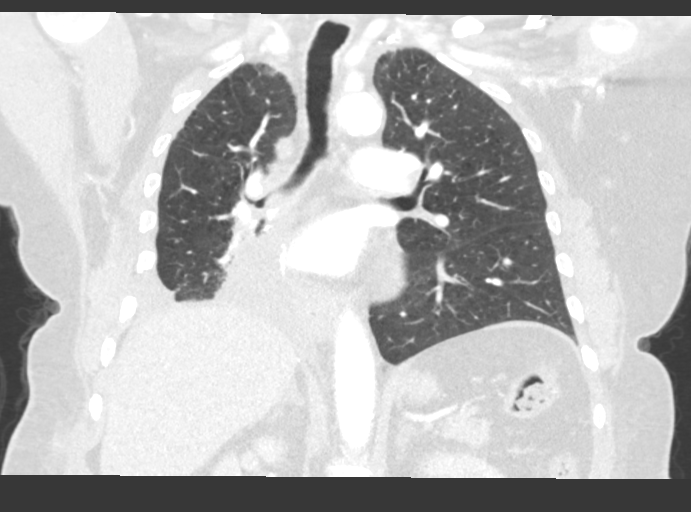
[im 87/145  lung]
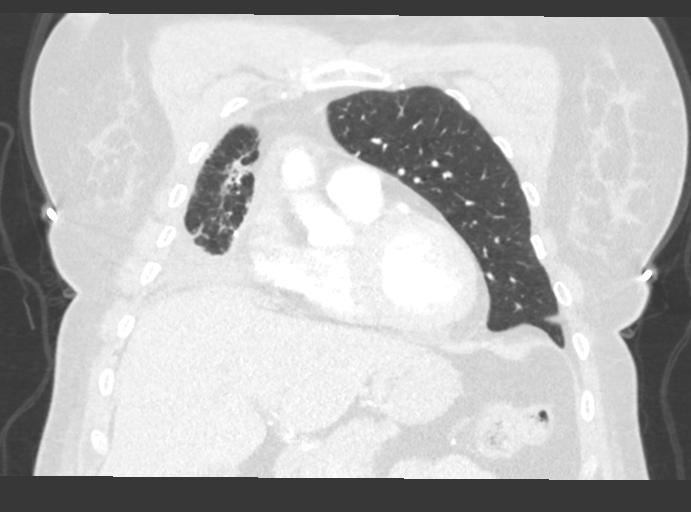

[15 of 36 positions shown; findings below may reference images not displayed]

FINDINGS: Cardiovascular: Heart size is normal. There is no significant
pericardial fluid, thickening or pericardial calcification. Aortic
atherosclerosis. No definite coronary artery calcifications.

Mediastinum/Nodes: No pathologically enlarged mediastinal or hilar
lymph nodes. Esophagus is unremarkable in appearance. No axillary
lymphadenopathy.

Lungs/Pleura: Status post right middle and lower lobectomy. Chronic
partially loculated right-sided pleural effusion and chronic pleural
thickening, similar to prior examinations. Chronic areas of
architectural distortion and septal thickening are noted in the
medial aspect of the right upper lobe, similar to prior studies,
compatible with chronic postradiation changes. No definite
suspicious appearing pulmonary nodules or masses are noted. No acute
consolidative airspace disease. No left pleural effusions. Mild
diffuse bronchial wall thickening with mild centrilobular and
paraseptal emphysema.

Upper Abdomen: Subcentimeter low-attenuation lesion in segment 8 of
the liver, incompletely characterized on today's examination, but
similar to prior studies, potentially a small cyst or cavernous
hemangioma.

Musculoskeletal: Postthoracotomy changes are noted in the
posterolateral aspect of the right sixth and seventh ribs. There are
no aggressive appearing lytic or blastic lesions noted in the
visualized portions of the skeleton.
IMPRESSION: 1. Status post right middle and lower lobectomy with no definitive
evidence of residual, recurrent or metastatic disease.
2. Aortic atherosclerosis.

Aortic Atherosclerosis ([X5]-[X5]).

## 2021-02-02 MED ORDER — IOHEXOL 350 MG/ML SOLN
80.0000 mL | Freq: Once | INTRAVENOUS | Status: AC | PRN
  Administered 2021-02-02: 80 mL via INTRAVENOUS

## 2021-02-03 ENCOUNTER — Other Ambulatory Visit: Payer: Self-pay

## 2021-02-03 ENCOUNTER — Inpatient Hospital Stay (HOSPITAL_BASED_OUTPATIENT_CLINIC_OR_DEPARTMENT_OTHER): Payer: Medicare Other | Admitting: Internal Medicine

## 2021-02-03 ENCOUNTER — Encounter: Payer: Self-pay | Admitting: Internal Medicine

## 2021-02-03 VITALS — BP 144/101 | HR 97 | Temp 98.0°F | Resp 18 | Ht 64.0 in | Wt 212.0 lb

## 2021-02-03 DIAGNOSIS — C3490 Malignant neoplasm of unspecified part of unspecified bronchus or lung: Secondary | ICD-10-CM

## 2021-02-03 DIAGNOSIS — E039 Hypothyroidism, unspecified: Secondary | ICD-10-CM | POA: Diagnosis not present

## 2021-02-03 DIAGNOSIS — C3411 Malignant neoplasm of upper lobe, right bronchus or lung: Secondary | ICD-10-CM | POA: Diagnosis not present

## 2021-02-03 DIAGNOSIS — Z79899 Other long term (current) drug therapy: Secondary | ICD-10-CM | POA: Diagnosis not present

## 2021-02-03 NOTE — Progress Notes (Signed)
Janice Adams Telephone:(336) [phone]   Fax:([phone]  OFFICE PROGRESS NOTE  Pcp, No No address on file  DIAGNOSIS: Stage IIIB (T3, M2, M0) non-small cell lung cancer, adenocarcinoma presented with right hilar mass with occlusion of the right middle lobe bronchus centrally with right middle lobe collapse diagnosed in January 2021.    PRIOR THERAPY: 1) Concurrent chemoradiation with weekly carboplatin for AUC of 2 and paclitaxel 45 mg/M2.  First dose expected on July 09, 2019.  Status post 8 cycles.  Last dose was given Sep 09, 2019. 2) Consolidation treatment with immunotherapy with Imfinzi 1500 mg IV every 4 weeks.  First dose 11/04/2019. Status post 2 cycles. Discontinued due to surgery 3) Robotic assisted right lower and middle lobectomy with lymph node dissection bilobectomy under the care of Dr. Roxan Hockey on 12/30/19. Pathology showed posttreatment T1, N0, stage Ia disease.   CURRENT THERAPY: Observation   INTERVAL HISTORY: Janice Adams 65 y.o. female returns to the clinic today for follow-up visit.  The patient is feeling fine today with no concerning complaints.  She gained several pounds since her last visit.  She denied having any chest pain but has shortness of breath with exertion with no cough or hemoptysis.  She has no nausea, vomiting, diarrhea or constipation.  She has no headache or visual changes.  She had repeat CT scan of the chest performed recently and she is here for evaluation and discussion of her scan results.  MEDICAL HISTORY: Past Medical History:  Diagnosis Date   Back pain    Cancer ([place])    Lung Cancer   GERD (gastroesophageal reflux disease)    Gout    no current problems per patient 05/31/19   History of epigastric pain    comes and goes   Hypertension    no meds    ALLERGIES:  has No Known Allergies.  MEDICATIONS:  Current Outpatient Medications  Medication Sig Dispense Refill   acetaminophen (TYLENOL) 500 MG tablet  Take 2 tablets (1,000 mg total) by mouth every 6 (six) hours. 30 tablet 0   levothyroxine (SYNTHROID) 150 MCG tablet Take 1 tablet (150 mcg total) by mouth daily before breakfast. 30 tablet 2   Multiple Vitamin (MULTIVITAMIN WITH MINERALS) TABS tablet Take 1 tablet by mouth daily.     omeprazole (PRILOSEC) 20 MG capsule TAKE ONE CAPSULE BY MOUTH TWICE DAILY BEFORE A MEAL TO LOWER STOMACH ACID 180 capsule 0   traMADol (ULTRAM) 50 MG tablet Take 1 tablet (50 mg total) by mouth every 6 (six) hours as needed (mild pain). 28 tablet 0   No current facility-administered medications for this visit.    SURGICAL HISTORY:  Past Surgical History:  Procedure Laterality Date   INTERCOSTAL NERVE BLOCK Right 12/30/2019   Procedure: INTERCOSTAL NERVE BLOCK;  Surgeon: Melrose Nakayama, MD;  Location: [place] [facility];  Service: Thoracic;  Laterality: Right;   LYMPH NODE DISSECTION Right 12/30/2019   Procedure: LYMPH NODE DISSECTION;  Surgeon: Melrose Nakayama, MD;  Location: [facility];  Service: Thoracic;  Laterality: Right;   MULTIPLE TOOTH EXTRACTIONS     TONSILLECTOMY     uterine ablation     VIDEO BRONCHOSCOPY N/A 12/30/2019   Procedure: VIDEO BRONCHOSCOPY;  Surgeon: Melrose Nakayama, MD;  Location[facility] OR;  Service: Thoracic;  Laterality: N/A;   VIDEO BRONCHOSCOPY WITH ENDOBRONCHIAL ULTRASOUND N/A 06/04/2019   Procedure: VIDEO BRONCHOSCOPY WITH ENDOBRONCHIAL ULTRASOUND;  Surgeon: Margaretha Seeds, MD;  Location: [place];  Service: Thoracic;  Laterality: N/A;   WISDOM TOOTH EXTRACTION      REVIEW OF SYSTEMS:  A comprehensive review of systems was negative except for: Respiratory: positive for dyspnea on exertion   PHYSICAL EXAMINATION: General appearance: alert, cooperative, and no distress Head: Normocephalic, without obvious abnormality, atraumatic Neck: no adenopathy, no JVD, supple, symmetrical, trachea midline, and thyroid not enlarged, symmetric, no tenderness/mass/nodules Lymph nodes: Cervical,  supraclavicular, and axillary nodes normal. Resp: clear to auscultation bilaterally Back: symmetric, no curvature. ROM normal. No CVA tenderness. Cardio: regular rate and rhythm, S1, S2 normal, no murmur, click, rub or gallop GI: soft, non-tender; bowel sounds normal; no masses,  no organomegaly Extremities: extremities normal, atraumatic, no cyanosis or edema  ECOG PERFORMANCE STATUS: 1 - Symptomatic but completely ambulatory  Blood pressure (!) 144/101, pulse 97, temperature 98 F (36.7 C), temperature source Tympanic, resp. rate 18, height 5\' 4"  (1.626 m), weight 212 lb (96.2 kg), SpO2 98 %.  LABORATORY DATA: Lab Results  Component Value Date   WBC 4.2 07/31/2020   HGB 12.8 07/31/2020   HCT 38.6 07/31/2020   MCV 100.3 (H) 07/31/2020   PLT 301 07/31/2020      Chemistry      Component Value Date/Time   NA 140 07/31/2020 1528   NA 146 (H) 03/06/2018 1128   K 3.6 07/31/2020 1528   CL 107 07/31/2020 1528   CO2 23 07/31/2020 1528   BUN 16 07/31/2020 1528   BUN 8 03/06/2018 1128   CREATININE 1.40 (H) 02/02/2021 1635   CREATININE 1.14 (H) 07/31/2020 1528   CREATININE 0.85 05/11/2016 1157      Component Value Date/Time   CALCIUM 9.8 07/31/2020 1528   ALKPHOS 71 07/31/2020 1528   AST 18 07/31/2020 1528   ALT 10 07/31/2020 1528   BILITOT 1.0 07/31/2020 1528       RADIOGRAPHIC STUDIES: DG Chest 2 View  Result Date: 01/12/2021 CLINICAL DATA:  Lung cancer. EXAM: CHEST - 2 VIEW COMPARISON:  April 14, 2020. FINDINGS: The heart size and mediastinal contours are within normal limits. Pneumothorax is noted. Left lung is clear. Small right pleural effusion is noted with associated right basilar atelectasis or infiltrate. The visualized skeletal structures are unremarkable. IMPRESSION: Stable small right pleural effusion with associated right basilar atelectasis or infiltrate. Electronically Signed   By: Marijo Conception M.D.   On: 01/12/2021 13:14   CT Chest W Contrast  Result  Date: 02/02/2021 CLINICAL DATA:  65 year old female with history of non-small cell lung cancer. Staging examination. EXAM: CT CHEST WITH CONTRAST TECHNIQUE: Multidetector CT imaging of the chest was performed during intravenous contrast administration. CONTRAST:  23mL OMNIPAQUE IOHEXOL 350 MG/ML SOLN COMPARISON:  Chest CT 07/31/2020. FINDINGS: Cardiovascular: Heart size is normal. There is no significant pericardial fluid, thickening or pericardial calcification. Aortic atherosclerosis. No definite coronary artery calcifications. Mediastinum/Nodes: No pathologically enlarged mediastinal or hilar lymph nodes. Esophagus is unremarkable in appearance. No axillary lymphadenopathy. Lungs/Pleura: Status post right middle and lower lobectomy. Chronic partially loculated right-sided pleural effusion and chronic pleural thickening, similar to prior examinations. Chronic areas of architectural distortion and septal thickening are noted in the medial aspect of the right upper lobe, similar to prior studies, compatible with chronic postradiation changes. No definite suspicious appearing pulmonary nodules or masses are noted. No acute consolidative airspace disease. No left pleural effusions. Mild diffuse bronchial wall thickening with mild centrilobular and paraseptal emphysema. Upper Abdomen: Subcentimeter low-attenuation lesion in segment 8 of the liver, incompletely characterized on  today's examination, but similar to prior studies, potentially a small cyst or cavernous hemangioma. Musculoskeletal: Postthoracotomy changes are noted in the posterolateral aspect of the right sixth and seventh ribs. There are no aggressive appearing lytic or blastic lesions noted in the visualized portions of the skeleton. IMPRESSION: 1. Status post right middle and lower lobectomy with no definitive evidence of residual, recurrent or metastatic disease. 2. Aortic atherosclerosis. Aortic Atherosclerosis (ICD10-I70.0). Electronically Signed    By: Vinnie Langton M.D.   On: 02/02/2021 20:01     ASSESSMENT AND PLAN: This is a very pleasant 65 years old African-American female diagnosed with stage IIIb (T3, N2, M0) non-small cell lung cancer, adenocarcinoma presented with right hilar mass with occlusion of the right middle lobe bronchus as well as right middle lobe collapse in addition to right lower lobe pulmonary nodule diagnosed in January 2021. She completed a course of concurrent chemoradiation with weekly carboplatin and paclitaxel status post 8 cycles.   The patient received 2 cycles of consolidation treatment with immunotherapy with Imfinzi before she underwent right middle and lower lobectomy with lymph node dissection under the care of Dr. Roxan Hockey. The patient is currently on observation and she is feeling fine today with no concerning complaints. She had repeat CT scan of the chest performed recently.  I personally and independently reviewed the scans and discussed the results with the patient today. Her scan showed the right middle and lower lobectomy was no definitive evidence for residual/recurrent or metastatic disease. I recommended for her to continue on observation with repeat CT scan of the chest in 6 months. For the hypothyroidism, she will continue levothyroxine and follow-up with primary care physician. The patient was advised to call immediately if she has any other concerning symptoms in the interval. The patient voices understanding of current disease status and treatment options and is in agreement with the current care plan.  All questions were answered. The patient knows to call the clinic with any problems, questions or concerns. We can certainly see the patient much sooner if necessary.  Disclaimer: This note was dictated with voice recognition software. Similar sounding words can inadvertently be transcribed and may not be corrected upon review.

## 2021-02-04 ENCOUNTER — Telehealth: Payer: Self-pay | Admitting: Internal Medicine

## 2021-02-04 NOTE — Telephone Encounter (Signed)
Scheduled appt per 9/28 los - mailed letter with appt date and time

## 2021-03-10 ENCOUNTER — Other Ambulatory Visit: Payer: Self-pay

## 2021-03-10 ENCOUNTER — Ambulatory Visit: Payer: Medicare Other | Attending: Family Medicine | Admitting: Physician Assistant

## 2021-03-10 ENCOUNTER — Encounter: Payer: Self-pay | Admitting: Physician Assistant

## 2021-03-10 VITALS — BP 136/96 | HR 101 | Resp 16 | Wt 210.0 lb

## 2021-03-10 DIAGNOSIS — E039 Hypothyroidism, unspecified: Secondary | ICD-10-CM

## 2021-03-10 DIAGNOSIS — T162XXA Foreign body in left ear, initial encounter: Secondary | ICD-10-CM | POA: Diagnosis not present

## 2021-03-10 DIAGNOSIS — R202 Paresthesia of skin: Secondary | ICD-10-CM | POA: Diagnosis not present

## 2021-03-10 DIAGNOSIS — R739 Hyperglycemia, unspecified: Secondary | ICD-10-CM

## 2021-03-10 MED ORDER — GABAPENTIN 300 MG PO CAPS: 300.0000 mg | ORAL_CAPSULE | Freq: Every day | ORAL | 1 refills | Status: DC

## 2021-03-10 NOTE — Progress Notes (Signed)
Patient ID: Janice Adams, female   DOB: 04-Aug-1955, 65 y.o.   MRN: [id number]   Cyenna Rebello, is a 65 y.o. female  PRF:[id number]  DJT:[id number]  DOB - [birth year]  Chief Complaint  Patient presents with   Numbness    Pt states both Feet and hands are numb, sometimes coming and going.       Subjective:   Janice Adams is a 65 y.o. female here today for a qtip in her L ear.  Also c/o B hands and feet paresthesias.  S/p chemo 1 year ago.  Not taking synthroid bc she was afraid it will make her fat.     No problems updated.  ALLERGIES: No Known Allergies  PAST MEDICAL HISTORY: Past Medical History:  Diagnosis Date   Back pain    Cancer ([place])    Lung Cancer   GERD (gastroesophageal reflux disease)    Gout    no current problems per patient 05/31/19   History of epigastric pain    comes and goes   Hypertension    no meds    MEDICATIONS AT HOME: Prior to Admission medications   Medication Sig Start Date End Date Taking? Authorizing Provider  acetaminophen (TYLENOL) 500 MG tablet Take 2 tablets (1,000 mg total) by mouth every 6 (six) hours. 01/03/20  Yes Conte, Tessa N, PA-C  gabapentin (NEURONTIN) 300 MG capsule Take 1 capsule (300 mg total) by mouth at bedtime. 03/10/21  Yes Freeman Caldron M, PA-C  levothyroxine (SYNTHROID) 150 MCG tablet Take 1 tablet (150 mcg total) by mouth daily before breakfast. 04/27/20  Yes Heilingoetter, Cassandra L, PA-C  Multiple Vitamin (MULTIVITAMIN WITH MINERALS) TABS tablet Take 1 tablet by mouth daily.   Yes [provider]  omeprazole (PRILOSEC) 20 MG capsule TAKE ONE CAPSULE BY MOUTH TWICE DAILY BEFORE A MEAL TO LOWER STOMACH ACID 01/05/20  Yes Fulp, Cammie, MD    ROS: Neg HEENT Neg resp Neg cardiac Neg GI Neg GU Neg MS Neg psych Neg neuro  Objective:   Vitals:   03/10/21 1444  BP: (!) 136/96  Pulse: (!) 101  Resp: 16  SpO2: 96%  Weight: 210 lb (95.3 kg)   Exam General appearance : Awake, alert, not in any  distress. Speech Clear. Not toxic looking HEENT: Atraumatic and Normocephalic R TM and canal WNL.  L TM with cotton blocking TM.  Alligator forceps to get qtip out.   Neck: Supple, no JVD. No cervical lymphadenopathy.  Chest: Good air entry bilaterally, CTAB.  No rales/rhonchi/wheezing CVS: S1 S2 regular, no murmurs.  Extremities: B/L Lower Ext shows no edema, both legs are warm to touch Neurology: Awake alert, and oriented X 3, CN II-XII intact, Non focal Skin: No Rash  Data Review Lab Results  Component Value Date   HGBA1C 5.5 11/08/2012    Assessment & Plan   1. Paresthesia ? Chemo vs not being on thyroid meds.   - Vitamin D, 25-hydroxy - gabapentin (NEURONTIN) 300 MG capsule; Take 1 capsule (300 mg total) by mouth at bedtime.  Dispense: 90 capsule; Refill: 1  2. Hypothyroidism, unspecified type Will send thyroid meds pending results - Thyroid Panel With TSH  3. FB ear, left, initial encounter resolved  4. Hyperglycemia I have had a lengthy discussion and provided education about insulin resistance and the intake of too much sugar/refined carbohydrates.  I have advised the patient to work at a goal of eliminating sugary drinks, candy, desserts, sweets, refined sugars, processed foods, and white carbohydrates.  The patient expresses understanding.   - Hemoglobin A1c    Patient have been counseled extensively about nutrition and exercise. Other issues discussed during this visit include: low cholesterol diet, weight control and daily exercise, foot care, annual eye examinations at Ophthalmology, importance of adherence with medications and regular follow-up. We also discussed long term complications of uncontrolled diabetes and hypertension.   Return in about 2 months (around 05/10/2021) for assign PCP and recheck thyroid.  The patient was given clear instructions to go to ER or return to medical center if symptoms don't improve, worsen or new problems develop. The patient  verbalized understanding. The patient was told to call to get lab results if they haven't heard anything in the next week.      Freeman Caldron, PA-C [facility] and [facility] [place], [place]   03/10/2021, 3:12 PM

## 2021-03-11 ENCOUNTER — Other Ambulatory Visit: Payer: Self-pay | Admitting: Physician Assistant

## 2021-03-11 DIAGNOSIS — E039 Hypothyroidism, unspecified: Secondary | ICD-10-CM

## 2021-03-11 LAB — THYROID PANEL WITH TSH
T3 Uptake Ratio: 12 % — ABNORMAL LOW (ref 24–39)
T4, Total: <0.4 ug/dL — CL (ref 4.5–12.0)
TSH: 229 u[IU]/mL — ABNORMAL HIGH (ref 0.450–4.500)

## 2021-03-11 LAB — HEMOGLOBIN A1C
Est. average glucose Bld gHb Est-mCnc: 131 mg/dL
Hgb A1c MFr Bld: 6.2 % — ABNORMAL HIGH (ref 4.8–5.6)

## 2021-03-11 LAB — VITAMIN D 25 HYDROXY (VIT D DEFICIENCY, FRACTURES): Vit D, 25-Hydroxy: 14 ng/mL — ABNORMAL LOW (ref 30.0–100.0)

## 2021-03-11 MED ORDER — LEVOTHYROXINE SODIUM 150 MCG PO TABS: 150.0000 ug | ORAL_TABLET | Freq: Every day | ORAL | 2 refills | Status: DC

## 2021-03-11 MED ORDER — VITAMIN D (ERGOCALCIFEROL) 1.25 MG (50000 UNIT) PO CAPS: 50000.0000 [IU] | ORAL_CAPSULE | ORAL | 0 refills | Status: DC

## 2021-03-15 ENCOUNTER — Telehealth: Payer: Self-pay | Admitting: Physician Assistant

## 2021-03-15 DIAGNOSIS — R202 Paresthesia of skin: Secondary | ICD-10-CM

## 2021-03-15 MED ORDER — GABAPENTIN 300 MG PO CAPS: 300.0000 mg | ORAL_CAPSULE | Freq: Every day | ORAL | 1 refills | Status: DC

## 2021-03-15 NOTE — Telephone Encounter (Signed)
New pharmacy. Requested Prescriptions  Pending Prescriptions Disp Refills  . gabapentin (NEURONTIN) 300 MG capsule 90 capsule 1    Sig: Take 1 capsule (300 mg total) by mouth at bedtime.     Neurology: Anticonvulsants - gabapentin Passed - 03/15/2021 11:55 AM      Passed - Valid encounter within last 12 months    Recent Outpatient Visits          5 days ago [facility] [place], [place], [place]   1 year ago Adenocarcinoma of right lung ([place])   Lynn, Enobong, MD   1 year ago Sepsis, due to unspecified organism, unspecified whether acute organ dysfunction present [facility])   [facility] [place], [place], [place]   2 years ago Vaginal discharge   Burns, MD   3 years ago Essential hypertension   [place], MD      Future Appointments            In 1 month Wynetta Emery, Dalbert Batman, MD [place]

## 2021-03-15 NOTE — Telephone Encounter (Signed)
Copied from [place] [phone]. Topic: Quick Communication - Rx Refill/Question >> Mar 15, 2021 10:16 AM Tessa Lerner A wrote: Medication: gabapentin (NEURONTIN) 300 MG capsule [[id number]]   Has the patient contacted their pharmacy? Yes.  This will be patient's first time using this pharmacy  (Agent: If no, request that the patient contact the pharmacy for the refill. If patient does not wish to contact the pharmacy document the reason why and proceed with request.) (Agent: If yes, when and what did the pharmacy advise?)  Preferred Pharmacy (with phone number or street name): [id number] - [place], [place].  Phone:  [phone] Fax:  [phone]    Has the patient been seen for an appointment in the last year OR does the patient have an upcoming appointment? Yes.    Agent: Please be advised that RX refills may take up to 3 business days. We ask that you follow-up with your pharmacy.

## 2021-03-16 MED ORDER — GABAPENTIN 300 MG PO CAPS: 300.0000 mg | ORAL_CAPSULE | Freq: Every day | ORAL | 1 refills | Status: DC

## 2021-03-16 NOTE — Telephone Encounter (Signed)
Patient called in asking for meds to be sent to [facility], [place], [address]. [phone], [facility]

## 2021-03-16 NOTE — Telephone Encounter (Signed)
Patient called to verify the pharmacy, advised the one mentioned [address], [place] is closed. She says it's another one on [address], same phone number. Advised medication will be sent.

## 2021-03-16 NOTE — Addendum Note (Signed)
Addended by: Matilde Sprang on: 03/16/2021 05:48 PM   Modules accepted: Orders

## 2021-03-31 ENCOUNTER — Ambulatory Visit: Payer: Self-pay | Admitting: *Deleted

## 2021-03-31 NOTE — Telephone Encounter (Signed)
     Reason for Disposition  [1] Numbness or tingling in one or both hands AND [2] is a chronic symptom (recurrent or ongoing AND present > 4 weeks)  Answer Assessment - Initial Assessment Questions 1. SYMPTOM: "What is the main symptom you are concerned about?" (e.g., weakness, numbness)     All fingers and both feet tingling 2. ONSET: "When did this start?" (minutes, hours, days; while sleeping)     After scan with contrast 3. LAST NORMAL: "When was the last time you (the patient) were normal (no symptoms)?"     Scan 02/02/21 4. PATTERN "Does this come and go, or has it been constant since it started?"  "Is it present now?"     Constant 5. CARDIAC SYMPTOMS: "Have you had any of the following symptoms: chest pain, difficulty breathing, palpitations?"      6. NEUROLOGIC SYMPTOMS: "Have you had any of the following symptoms: headache, dizziness, vision loss, double vision, changes in speech, unsteady on your feet?"     no 7. OTHER SYMPTOMS: "Do you have any other symptoms?"     no  Protocols used: Neurologic Deficit-A-AH

## 2021-03-31 NOTE — Telephone Encounter (Signed)
Pt reports tingling of fingers on both hands and both feet. Saw Levada Dy 01/08/21, placed on Gabapentin.  Pt reports "Hasn't helped at all." States no worsening, just hasn't helped. Denies any discoloration, no redness, swelling. States occurred after CT scan with contrast on 02/02/21.  No availability at practice within protocol timeframe. Assured pt NT would route to practice for PCPs review and final disposition.  Please advise: [phone]

## 2021-04-02 ENCOUNTER — Ambulatory Visit: Payer: Self-pay

## 2021-04-02 NOTE — Telephone Encounter (Signed)
Pt. Reports she is still having numbness and tingling to hands and feet. Has remained the same since she spoke with a nurse this week.Reports she has an appointment Next week. Instructed if symptoms worsen to go to ED.    Answer Assessment - Initial Assessment Questions 1. SYMPTOM: "What is the main symptom you are concerned about?" (e.g., weakness, numbness)     Numbness 2. ONSET: "When did this start?" (minutes, hours, days; while sleeping)     1-2 months ago 3. LAST NORMAL: "When was the last time you (the patient) were normal (no symptoms)?"     2 months ago 4. PATTERN "Does this come and go, or has it been constant since it started?"  "Is it present now?"     Constant 5. CARDIAC SYMPTOMS: "Have you had any of the following symptoms: chest pain, difficulty breathing, palpitations?"     No 6. NEUROLOGIC SYMPTOMS: "Have you had any of the following symptoms: headache, dizziness, vision loss, double vision, changes in speech, unsteady on your feet?"     Hands and feet 7. OTHER SYMPTOMS: "Do you have any other symptoms?"     Weakness 8. PREGNANCY: "Is there any chance you are pregnant?" "When was your last menstrual period?"     No  Protocols used: Neurologic Deficit-A-AH

## 2021-04-06 ENCOUNTER — Encounter: Payer: Self-pay | Admitting: Critical Care Medicine

## 2021-04-06 ENCOUNTER — Other Ambulatory Visit: Payer: Self-pay

## 2021-04-06 ENCOUNTER — Ambulatory Visit: Payer: Medicare Other | Attending: Critical Care Medicine | Admitting: Critical Care Medicine

## 2021-04-06 VITALS — BP 111/77 | HR 111 | Resp 16 | Wt 194.4 lb

## 2021-04-06 DIAGNOSIS — Z23 Encounter for immunization: Secondary | ICD-10-CM

## 2021-04-06 DIAGNOSIS — E039 Hypothyroidism, unspecified: Secondary | ICD-10-CM | POA: Insufficient documentation

## 2021-04-06 DIAGNOSIS — R202 Paresthesia of skin: Secondary | ICD-10-CM | POA: Insufficient documentation

## 2021-04-06 DIAGNOSIS — Z87891 Personal history of nicotine dependence: Secondary | ICD-10-CM | POA: Diagnosis not present

## 2021-04-06 DIAGNOSIS — Z1211 Encounter for screening for malignant neoplasm of colon: Secondary | ICD-10-CM

## 2021-04-06 DIAGNOSIS — Z139 Encounter for screening, unspecified: Secondary | ICD-10-CM

## 2021-04-06 DIAGNOSIS — C342 Malignant neoplasm of middle lobe, bronchus or lung: Secondary | ICD-10-CM | POA: Diagnosis not present

## 2021-04-06 DIAGNOSIS — Z1231 Encounter for screening mammogram for malignant neoplasm of breast: Secondary | ICD-10-CM

## 2021-04-06 DIAGNOSIS — Z1159 Encounter for screening for other viral diseases: Secondary | ICD-10-CM

## 2021-04-06 MED ORDER — LEVOTHYROXINE SODIUM 150 MCG PO TABS: 150.0000 ug | ORAL_TABLET | Freq: Every day | ORAL | 2 refills | Status: DC

## 2021-04-06 MED ORDER — VITAMIN D (ERGOCALCIFEROL) 1.25 MG (50000 UNIT) PO CAPS: 50000.0000 [IU] | ORAL_CAPSULE | ORAL | 3 refills | Status: DC

## 2021-04-06 MED ORDER — OMEPRAZOLE 20 MG PO CPDR: 20.0000 mg | DELAYED_RELEASE_CAPSULE | Freq: Every day | ORAL | 4 refills | Status: DC

## 2021-04-06 NOTE — Progress Notes (Signed)
New Patient Office Visit  Subjective:  Patient ID: Janice Adams, female    DOB: 1956-03-27  Age: 65 y.o. MRN: [id number]  CC:  Chief Complaint  Patient presents with   Numbness    HPI Jamel Dunton presents for primary care to establish.  This is a former Dr. Chapman Fitch patient.  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 Patient is followed by oncology last visit a month ago and was felt to be stable CT scan was stable repeat CT scan in 6 months.  Patient has persistent numbness in both hands and does have severe hypothyroidism.  She is on 150 mcg of Synthroid daily without much change in the numbness.  She did develop numbness after receiving chemotherapy which was associated with paresthesias.  She is not had chemotherapy in over a year.  The patient does agree to receiving a pneumonia vaccine but declines to receive flu vaccine at this time.  Patient does have prediabetes with mild elevation in A1c treated with diet alone. Past Medical History:  Diagnosis Date   Back pain    Cancer ([place])    Lung Cancer   GERD (gastroesophageal reflux disease)    Gout    no current problems per patient 05/31/19   History of epigastric pain    comes and goes   Hypertension    no meds    Past Surgical History:  Procedure Laterality Date   INTERCOSTAL NERVE BLOCK Right 12/30/2019   Procedure: INTERCOSTAL NERVE BLOCK;  Surgeon: Melrose Nakayama, MD;  Location: [facility];  Service: Thoracic;  Laterality: Right;   LYMPH NODE DISSECTION Right 12/30/2019   Procedure: LYMPH NODE DISSECTION;  Surgeon: Melrose Nakayama, MD;  Location: [facility];  Service: Thoracic;  Laterality: Right;   MULTIPLE TOOTH EXTRACTIONS     TONSILLECTOMY     uterine ablation     VIDEO BRONCHOSCOPY N/A 12/30/2019   Procedure: VIDEO BRONCHOSCOPY;  Surgeon: Melrose Nakayama, MD;  Location: [facility];   Service: Thoracic;  Laterality: N/A;   VIDEO BRONCHOSCOPY WITH ENDOBRONCHIAL ULTRASOUND N/A 06/04/2019   Procedure: VIDEO BRONCHOSCOPY WITH ENDOBRONCHIAL ULTRASOUND;  Surgeon: Margaretha Seeds, MD;  Location: [facility];  Service: Thoracic;  Laterality: N/A;   WISDOM TOOTH EXTRACTION      Family History  Problem Relation Age of Onset   Breast cancer Mother    Cancer Mother    Seizures Father    Breast cancer Maternal Grandmother    Breast cancer Daughter    Cancer Daughter    Heart attack Brother     Social History   Socioeconomic History   Marital status: Divorced    Spouse name: Not on file   Number of children: 5   Years of education: Not on file   Highest education level: Not on file  Occupational History   Occupation: Childcare  Tobacco Use   Smoking status: Former    Packs/day: 0.50    Years: 47.00    Pack years: 23.50    Types: Cigarettes    Quit date: 07/07/2019    Years since quitting: 1.7   Smokeless tobacco: Never  Vaping Use   Vaping Use: Never used  Substance and Sexual Activity   Alcohol use: No    Alcohol/week: 0.0 standard drinks   Drug use: No    Comment: last use Wed 05/28/19   Sexual activity: Not on file  Other Topics Concern  Not on file  Social History Narrative   Not on file   Social Determinants of Health   Financial Resource Strain: Not on file  Food Insecurity: Not on file  Transportation Needs: Not on file  Physical Activity: Not on file  Stress: Not on file  Social Connections: Not on file  Intimate Partner Violence: Not on file    ROS Review of Systems  Constitutional:  Negative for chills, diaphoresis and fever.  HENT:  Positive for rhinorrhea. Negative for congestion, ear discharge, ear pain, hearing loss, nosebleeds, postnasal drip, sore throat and tinnitus.   Eyes:  Negative for photophobia and discharge.  Respiratory:  Positive for cough. Negative for shortness of breath, wheezing and stridor.   Cardiovascular:  Negative  for chest pain, palpitations and leg swelling.  Gastrointestinal:  Negative for abdominal pain, blood in stool, constipation, diarrhea, nausea and vomiting.  Endocrine: Negative for polydipsia.  Genitourinary: Negative.  Negative for dysuria, flank pain, frequency, hematuria and urgency.  Musculoskeletal:  Negative for back pain, myalgias and neck pain.  Skin:  Negative for rash.  Allergic/Immunologic: Negative for environmental allergies.  Neurological:  Positive for numbness. Negative for dizziness, tremors, seizures, weakness and headaches.  Hematological:  Does not bruise/bleed easily.  Psychiatric/Behavioral:  Negative for hallucinations and suicidal ideas. The patient is not nervous/anxious.   All other systems reviewed and are negative.  Objective:   Today's Vitals: BP 111/77   Pulse (!) 111   Resp 16   Wt 194 lb 6.4 oz (88.2 kg)   LMP  (LMP Unknown)   SpO2 94%   BMI 33.37 kg/m   Physical Exam Vitals reviewed.  Constitutional:      Appearance: Normal appearance. She is well-developed. She is obese. She is not diaphoretic.  HENT:     Head: Normocephalic and atraumatic.     Nose: No nasal deformity, septal deviation, mucosal edema or rhinorrhea.     Right Sinus: No maxillary sinus tenderness or frontal sinus tenderness.     Left Sinus: No maxillary sinus tenderness or frontal sinus tenderness.     Mouth/Throat:     Pharynx: No oropharyngeal exudate.  Eyes:     General: No scleral icterus.    Conjunctiva/sclera: Conjunctivae normal.     Pupils: Pupils are equal, round, and reactive to light.  Neck:     Thyroid: No thyromegaly.     Vascular: No carotid bruit or JVD.     Trachea: Trachea normal. No tracheal tenderness or tracheal deviation.  Cardiovascular:     Rate and Rhythm: Normal rate and regular rhythm.     Chest Wall: PMI is not displaced.     Pulses: Normal pulses. No decreased pulses.     Heart sounds: Normal heart sounds, S1 normal and S2 normal. Heart sounds  not distant. No murmur heard. No systolic murmur is present.  No diastolic murmur is present.    No friction rub. No gallop. No S3 or S4 sounds.  Pulmonary:     Effort: No tachypnea, accessory muscle usage or respiratory distress.     Breath sounds: No stridor. No decreased breath sounds, wheezing, rhonchi or rales.  Chest:     Chest wall: No tenderness.  Abdominal:     General: Bowel sounds are normal. There is no distension.     Palpations: Abdomen is soft. Abdomen is not rigid.     Tenderness: There is no abdominal tenderness. There is no guarding or rebound.  Musculoskeletal:  General: Normal range of motion.     Cervical back: Normal range of motion and neck supple. No edema, erythema or rigidity. No muscular tenderness. Normal range of motion.  Lymphadenopathy:     Head:     Right side of head: No submental or submandibular adenopathy.     Left side of head: No submental or submandibular adenopathy.     Cervical: No cervical adenopathy.  Skin:    General: Skin is warm and dry.     Coloration: Skin is not pale.     Findings: No rash.     Nails: There is no clubbing.  Neurological:     General: No focal deficit present.     Mental Status: She is alert and oriented to person, place, and time.     Sensory: No sensory deficit.  Psychiatric:        Mood and Affect: Mood normal.        Speech: Speech normal.        Behavior: Behavior normal.        Thought Content: Thought content normal.        Judgment: Judgment normal.    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 (HCC)    The patient's lung cancer is stable at this time status post resection status post primary radiation and chemotherapy preop  Continue to monitor with CT scan in 6 months        Endocrine   Hypothyroidism - Primary    History of hypothyroidism at last check was severely hypothyroid  Repeat  thyroid function on 150 mcg of Synthroid      Relevant Medications   levothyroxine (SYNTHROID) 150 MCG tablet   Other Relevant Orders   Thyroid Panel With TSH     Other   Former smoker    Patient is successfully quit smoking      Paresthesias    Likely secondary to chemotherapy and hypothyroidism  Follow-up thyroid levels      Other Visit Diagnoses     Colon cancer screening       Relevant Orders   Cologuard   Need for hepatitis C screening test       Relevant Orders   HCV Ab w Reflex to Quant PCR   Encounter for health-related screening       Relevant Orders   Comprehensive metabolic panel   CBC with Differential/Platelet   Lipid panel   Encounter for screening mammogram for malignant neoplasm of breast       Relevant Orders   MM Digital Screening   Need for Streptococcus pneumoniae vaccination       Relevant Orders   Pneumococcal conjugate vaccine 20-valent (Completed)       Outpatient Encounter Medications as of 04/06/2021  Medication Sig   acetaminophen (TYLENOL) 500 MG tablet Take 2 tablets (1,000 mg total) by mouth every 6 (six) hours.   Multiple Vitamin (MULTIVITAMIN WITH MINERALS) TABS tablet Take 1 tablet by mouth daily.   [DISCONTINUED] gabapentin (NEURONTIN) 300 MG capsule Take 1 capsule (300 mg total) by mouth at bedtime.   [DISCONTINUED] levothyroxine (SYNTHROID) 150 MCG tablet Take 1 tablet (150 mcg total) by mouth daily before breakfast.   [DISCONTINUED] omeprazole (PRILOSEC) 20 MG capsule TAKE ONE CAPSULE BY MOUTH TWICE DAILY BEFORE A MEAL TO LOWER STOMACH ACID (Patient not taking: Reported on 04/06/2021)   [  DISCONTINUED] omeprazole (PRILOSEC) 20 MG capsule Take 20 mg by mouth daily.   [DISCONTINUED] Vitamin D, Ergocalciferol, (DRISDOL) 1.25 MG (50000 UNIT) CAPS capsule Take 1 capsule (50,000 Units total) by mouth every 7 (seven) days.   levothyroxine (SYNTHROID) 150 MCG tablet Take 1 tablet (150 mcg total) by mouth daily before breakfast.    omeprazole (PRILOSEC) 20 MG capsule Take 1 capsule (20 mg total) by mouth daily.   Vitamin D, Ergocalciferol, (DRISDOL) 1.25 MG (50000 UNIT) CAPS capsule Take 1 capsule (50,000 Units total) by mouth every 7 (seven) days.   No facility-administered encounter medications on file as of 04/06/2021.  Patient routinely received a Prevnar 20 pneumonia vaccine Patient agrees to colon cancer screening with a Cologuard kit Patient agrees to mammogram screening for breast cancer this was ordered Follow-up: Return in about 3 months (around 07/06/2021).   Asencion Noble, MD

## 2021-04-06 NOTE — Assessment & Plan Note (Signed)
Likely secondary to chemotherapy and hypothyroidism  Follow-up thyroid levels

## 2021-04-06 NOTE — Patient Instructions (Signed)
Complete set of screening labs obtained today including her thyroid function  Refills on all your medications sent to the [facility]  Discontinue gabapentin  Colon cancer screening will be obtained with a Cologuard  Breast cancer screening will be obtained with mammogram they will call you for an appointment  No dose change on thyroid medicine for now  We discussed following a healthy diet see attached  Return to see Dr. Joya Gaskins 3 months

## 2021-04-06 NOTE — Assessment & Plan Note (Signed)
The patient's lung cancer is stable at this time status post resection status post primary radiation and chemotherapy preop  Continue to monitor with CT scan in 6 months

## 2021-04-06 NOTE — Assessment & Plan Note (Signed)
Patient is successfully quit smoking

## 2021-04-06 NOTE — Assessment & Plan Note (Signed)
History of hypothyroidism at last check was severely hypothyroid  Repeat thyroid function on 150 mcg of Synthroid

## 2021-04-07 ENCOUNTER — Encounter: Payer: Self-pay | Admitting: Internal Medicine

## 2021-04-07 ENCOUNTER — Telehealth: Payer: Self-pay

## 2021-04-07 ENCOUNTER — Other Ambulatory Visit: Payer: Self-pay | Admitting: Family Medicine

## 2021-04-07 DIAGNOSIS — E039 Hypothyroidism, unspecified: Secondary | ICD-10-CM

## 2021-04-07 DIAGNOSIS — D708 Other neutropenia: Secondary | ICD-10-CM

## 2021-04-07 LAB — LIPID PANEL
Chol/HDL Ratio: 7.3 ratio — ABNORMAL HIGH (ref 0.0–4.4)
Cholesterol, Total: 234 mg/dL — ABNORMAL HIGH (ref 100–199)
HDL: 32 mg/dL — ABNORMAL LOW (ref >39)
LDL Chol Calc (NIH): 178 mg/dL — ABNORMAL HIGH (ref 0–99)
Triglycerides: 131 mg/dL (ref 0–149)
VLDL Cholesterol Cal: 24 mg/dL (ref 5–40)

## 2021-04-07 LAB — COMPREHENSIVE METABOLIC PANEL
ALT: 12 IU/L (ref 0–32)
AST: 20 IU/L (ref 0–40)
Albumin/Globulin Ratio: 1.5 (ref 1.2–2.2)
Albumin: 5.1 g/dL — ABNORMAL HIGH (ref 3.8–4.8)
Alkaline Phosphatase: 97 IU/L (ref 44–121)
BUN/Creatinine Ratio: 15 (ref 12–28)
BUN: 18 mg/dL (ref 8–27)
Bilirubin Total: 0.4 mg/dL (ref 0.0–1.2)
CO2: 20 mmol/L (ref 20–29)
Calcium: 9.8 mg/dL (ref 8.7–10.3)
Chloride: 96 mmol/L (ref 96–106)
Creatinine, Ser: 1.21 mg/dL — ABNORMAL HIGH (ref 0.57–1.00)
Globulin, Total: 3.3 g/dL (ref 1.5–4.5)
Glucose: 110 mg/dL — ABNORMAL HIGH (ref 70–99)
Potassium: 4.2 mmol/L (ref 3.5–5.2)
Sodium: 135 mmol/L (ref 134–144)
Total Protein: 8.4 g/dL (ref 6.0–8.5)
eGFR: 49 mL/min/{1.73_m2} — ABNORMAL LOW (ref >59)

## 2021-04-07 LAB — THYROID PANEL WITH TSH
Free Thyroxine Index: 2.2 (ref 1.2–4.9)
T3 Uptake Ratio: 24 % (ref 24–39)
T4, Total: 9.1 ug/dL (ref 4.5–12.0)
TSH: 37.3 u[IU]/mL — ABNORMAL HIGH (ref 0.450–4.500)

## 2021-04-07 LAB — CBC WITH DIFFERENTIAL/PLATELET
Basophils Absolute: 0 10*3/uL (ref 0.0–0.2)
Basos: 1 %
EOS (ABSOLUTE): 0 10*3/uL (ref 0.0–0.4)
Eos: 0 %
Hematocrit: 34.1 % (ref 34.0–46.6)
Hemoglobin: 11.7 g/dL (ref 11.1–15.9)
Immature Grans (Abs): 0 10*3/uL (ref 0.0–0.1)
Immature Granulocytes: 0 %
Lymphocytes Absolute: 0.8 10*3/uL (ref 0.7–3.1)
Lymphs: 36 %
MCH: 34.3 pg — ABNORMAL HIGH (ref 26.6–33.0)
MCHC: 34.3 g/dL (ref 31.5–35.7)
MCV: 100 fL — ABNORMAL HIGH (ref 79–97)
Monocytes Absolute: 0.2 10*3/uL (ref 0.1–0.9)
Monocytes: 10 %
Neutrophils Absolute: 1.2 10*3/uL — ABNORMAL LOW (ref 1.4–7.0)
Neutrophils: 53 %
Platelets: 355 10*3/uL (ref 150–450)
RBC: 3.41 x10E6/uL — ABNORMAL LOW (ref 3.77–5.28)
RDW: 13.6 % (ref 11.7–15.4)
WBC: 2.2 10*3/uL — CL (ref 3.4–10.8)

## 2021-04-07 LAB — HCV AB W REFLEX TO QUANT PCR: HCV Ab: 0.1 s/co ratio (ref 0.0–0.9)

## 2021-04-07 LAB — HCV INTERPRETATION

## 2021-04-07 MED ORDER — LEVOTHYROXINE SODIUM 175 MCG PO TABS: 175.0000 ug | ORAL_TABLET | Freq: Every day | ORAL | 2 refills | Status: DC

## 2021-04-07 NOTE — Telephone Encounter (Signed)
-----   Message from Charlott Rakes, MD sent at 04/07/2021  9:06 AM EST ----- Please inform her that labs ordered by Dr. Joya Gaskins reveals abnormal thyroid panel and I have sent an increased dose of levothyroxine 175 mcg to her pharmacy.  Thyroid panel will need to be repeated at her upcoming visit in January. She also has severely low white blood cell count and I have referred her to hematology for further evaluation.  Cholesterol is elevated compared to last set of labs.  Advised to work on a low-cholesterol diet, lifestyle modifications and this will need to be repeated at a subsequent visit.

## 2021-04-07 NOTE — Telephone Encounter (Signed)
Pt was called and is aware of results, DOB was confirmed.  ?

## 2021-04-22 ENCOUNTER — Ambulatory Visit: Payer: Self-pay

## 2021-04-22 NOTE — Telephone Encounter (Signed)
°  Chief Complaint: numbness in hands and feet Symptoms: numbness in hands and feet Frequency: several weeks Pertinent Negatives: NA Disposition: [] ED /[] Urgent Care (no appt availability in office) / [] Appointment(In office/virtual)/ []  [facility]/ [] Home Care/ [] Refused Recommended Disposition  Additional Notes:  Pt called in states she has numbness in hands and feet for a while now and seen Dr. Joya Gaskins for this, he had given her gabapentin but it made it worse and she gave it back to him. She states she thought he was going to give her some different medication but hasn't. Attempted to schedule pt appt but d/t no availability and her not having transportation, advised pt she can go to UC/ED to be seen or if gives a day with transportation can give her location of mobile unit. Asked pt about virtual care options but pt isn't familiar with virtual care. Also advised pt that I would send a message to Dr. Joya Gaskins letting him know her request. Pt verbalized understanding.    Reason for Disposition  [1] Numbness or tingling in one or both hands AND [2] is a chronic symptom (recurrent or ongoing AND present > 4 weeks)  Answer Assessment - Initial Assessment Questions 1. SYMPTOM: "What is the main symptom you are concerned about?" (e.g., weakness, numbness)     Numbness in fingers and toes 2. ONSET: "When did this start?" (minutes, hours, days; while sleeping)     For a while 3. LAST NORMAL: "When was the last time you (the patient) were normal (no symptoms)?"     Over a month ago  4. PATTERN "Does this come and go, or has it been constant since it started?"  "Is it present now?"     constant 5. CARDIAC SYMPTOMS: "Have you had any of the following symptoms: chest pain, difficulty breathing, palpitations?"     No 6. NEUROLOGIC SYMPTOMS: "Have you had any of the following symptoms: headache, dizziness, vision loss, double vision, changes in speech, unsteady on your feet?"     Unsteady on  foot  7. OTHER SYMPTOMS: "Do you have any other symptoms?"     No  Protocols used: Neurologic Deficit-A-AH

## 2021-04-22 NOTE — Telephone Encounter (Signed)
Ok to double last remaining slot next week

## 2021-04-23 NOTE — Telephone Encounter (Signed)
Called pt and made appointment. 

## 2021-04-27 ENCOUNTER — Telehealth: Payer: Self-pay

## 2021-04-27 NOTE — Telephone Encounter (Signed)
Addendum to prior call.  This CM also informed patient that she can contact [facility] as well as Medicaid for transportation to her medical appointments. They usually require a 2-3 day notice.Marland Kitchen

## 2021-04-27 NOTE — Telephone Encounter (Signed)
Message received that patient needs transportation to her upcoming appointment with Dr Joya Gaskins 04/29/2021.   Called her and explained that this CM will arrange [facility] for her to the appointment. She confirmed her address. Informed her that it will be a [employer]ice [facility] service and she said she understood.  Ride ID # [id number].

## 2021-04-28 NOTE — Progress Notes (Unsigned)
Established Patient Office Visit  Subjective:  Patient ID: Janice Adams, female    DOB: 1955/11/13  Age: 65 y.o. MRN: [id number]  CC: No chief complaint on file.   HPI Janice Adams presents for  04/06/21 Janice Adams presents for primary care to establish.  This is a former Dr. Chapman Fitch patient.  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 Patient is followed by oncology last visit a month ago and was felt to be stable CT scan was stable repeat CT scan in 6 months.   Patient has persistent numbness in both hands and does have severe hypothyroidism.  She is on 150 mcg of Synthroid daily without much change in the numbness.  She did develop numbness after receiving chemotherapy which was associated with paresthesias.  She is not had chemotherapy in over a year.   The patient does agree to receiving a pneumonia vaccine but declines to receive flu vaccine at this time.   Patient does have prediabetes with mild elevation in A1c treated with diet alone.  Respiratory    Primary malignant neoplasm of bronchus of right middle lobe (HCC)      The patient's lung cancer is stable at this time status post resection status post primary radiation and chemotherapy preop   Continue to monitor with CT scan in 6 months            Endocrine    Hypothyroidism - Primary      History of hypothyroidism at last check was severely hypothyroid   Repeat thyroid function on 150 mcg of Synthroid        Relevant Medications    levothyroxine (SYNTHROID) 150 MCG tablet    Other Relevant Orders    Thyroid Panel With TSH        Other    Former smoker      Patient is successfully quit smoking        Paresthesias      Likely secondary to chemotherapy and hypothyroidism   Follow-up thyroid levels        Other Visit Diagnoses       Colon cancer screening        Relevant  Orders    Cologuard    Need for hepatitis C screening test        Relevant Orders    HCV Ab w Reflex to Quant PCR    Encounter for health-related screening        Relevant Orders    Comprehensive metabolic panel    CBC with Differential/Platelet    Lipid panel    Encounter for screening mammogram for malignant neoplasm of breast        Relevant Orders    MM Digital Screening    Need for Streptococcus pneumoniae vaccination        Relevant Orders    Pneumococcal conjugate vaccine 20-valent (Completed)   Past Medical History:  Diagnosis Date   Back pain    Cancer ([place])    Lung Cancer   GERD (gastroesophageal reflux disease)    Gout    no current problems per patient 05/31/19   History of epigastric pain    comes and goes   Hypertension    no meds    Past Surgical History:  Procedure Laterality Date   INTERCOSTAL NERVE BLOCK Right 12/30/2019   Procedure: INTERCOSTAL NERVE BLOCK;  Surgeon: Melrose Nakayama, MD;  Location: [place];  Service: Thoracic;  Laterality: Right;   LYMPH NODE DISSECTION Right 12/30/2019   Procedure: LYMPH NODE DISSECTION;  Surgeon: Melrose Nakayama, MD;  Location: [place];  Service: Thoracic;  Laterality: Right;   MULTIPLE TOOTH EXTRACTIONS     TONSILLECTOMY     uterine ablation     VIDEO BRONCHOSCOPY N/A 12/30/2019   Procedure: VIDEO BRONCHOSCOPY;  Surgeon: Melrose Nakayama, MD;  [facility];  Service: Thoracic;  Laterality: N/A;   VIDEO BRONCHOSCOPY WITH ENDOBRONCHIAL ULTRASOUND N/A 06/04/2019   Procedure: VIDEO BRONCHOSCOPY WITH ENDOBRONCHIAL ULTRASOUND;  Surgeon: Margaretha Seeds, MD;  Location: [place];  Service: Thoracic;  Laterality: N/A;   WISDOM TOOTH EXTRACTION      Family History  Problem Relation Age of Onset   Breast cancer Mother    Cancer Mother    Seizures Father    Breast cancer Maternal Grandmother    Breast cancer Daughter    Cancer Daughter    Heart attack Brother     Social History   Socioeconomic History    Marital status: Divorced    Spouse name: Not on file   Number of children: 5   Years of education: Not on file   Highest education level: Not on file  Occupational History   Occupation: Childcare  Tobacco Use   Smoking status: Former    Packs/day: 0.50    Years: 47.00    Pack years: 23.50    Types: Cigarettes    Quit date: 07/07/2019    Years since quitting: 1.8   Smokeless tobacco: Never  Vaping Use   Vaping Use: Never used  Substance and Sexual Activity   Alcohol use: No    Alcohol/week: 0.0 standard drinks   Drug use: No    Comment: last use Wed 05/28/19   Sexual activity: Not on file  Other Topics Concern   Not on file  Social History Narrative   Not on file   Social Determinants of Health   Financial Resource Strain: Not on file  Food Insecurity: Not on file  Transportation Needs: Not on file  Physical Activity: Not on file  Stress: Not on file  Social Connections: Not on file  Intimate Partner Violence: Not on file    Outpatient Medications Prior to Visit  Medication Sig Dispense Refill   acetaminophen (TYLENOL) 500 MG tablet Take 2 tablets (1,000 mg total) by mouth every 6 (six) hours. 30 tablet 0   levothyroxine (SYNTHROID) 175 MCG tablet Take 1 tablet (175 mcg total) by mouth daily before breakfast. 30 tablet 2   Multiple Vitamin (MULTIVITAMIN WITH MINERALS) TABS tablet Take 1 tablet by mouth daily.     omeprazole (PRILOSEC) 20 MG capsule Take 1 capsule (20 mg total) by mouth daily. 60 capsule 4   Vitamin D, Ergocalciferol, (DRISDOL) 1.25 MG (50000 UNIT) CAPS capsule Take 1 capsule (50,000 Units total) by mouth every 7 (seven) days. 16 capsule 3   No facility-administered medications prior to visit.    No Known Allergies  ROS Review of Systems    Objective:    Physical Exam  LMP  (LMP Unknown)  Wt Readings from Last 3 Encounters:  04/06/21 194 lb 6.4 oz (88.2 kg)  03/10/21 210 lb (95.3 kg)  02/03/21 212 lb (96.2 kg)     Health Maintenance  Due  Topic Date Due   Fecal DNA (Cologuard)  Never done   MAMMOGRAM  10/19/2018    There are no preventive care reminders to display for this  patient.  Lab Results  Component Value Date   TSH 37.300 (H) 04/06/2021   Lab Results  Component Value Date   WBC 2.2 (LL) 04/06/2021   HGB 11.7 04/06/2021   HCT 34.1 04/06/2021   MCV 100 (H) 04/06/2021   PLT 355 04/06/2021   Lab Results  Component Value Date   NA 135 04/06/2021   K 4.2 04/06/2021   CO2 20 04/06/2021   GLUCOSE 110 (H) 04/06/2021   BUN 18 04/06/2021   CREATININE 1.21 (H) 04/06/2021   BILITOT 0.4 04/06/2021   ALKPHOS 97 04/06/2021   AST 20 04/06/2021   ALT 12 04/06/2021   PROT 8.4 04/06/2021   ALBUMIN 5.1 (H) 04/06/2021   CALCIUM 9.8 04/06/2021   ANIONGAP 10 07/31/2020   EGFR 49 (L) 04/06/2021   Lab Results  Component Value Date   CHOL 234 (H) 04/06/2021   Lab Results  Component Value Date   HDL 32 (L) 04/06/2021   Lab Results  Component Value Date   LDLCALC 178 (H) 04/06/2021   Lab Results  Component Value Date   TRIG 131 04/06/2021   Lab Results  Component Value Date   CHOLHDL 7.3 (H) 04/06/2021   Lab Results  Component Value Date   HGBA1C 6.2 (H) 03/10/2021      Assessment & Plan:   Problem List Items Addressed This Visit   None   No orders of the defined types were placed in this encounter.   Follow-up: No follow-ups on file.    Asencion Noble, MD

## 2021-04-29 ENCOUNTER — Ambulatory Visit: Payer: Medicare Other | Admitting: Critical Care Medicine

## 2021-04-29 ENCOUNTER — Other Ambulatory Visit: Payer: Self-pay | Admitting: Critical Care Medicine

## 2021-04-29 DIAGNOSIS — E039 Hypothyroidism, unspecified: Secondary | ICD-10-CM

## 2021-04-29 NOTE — Progress Notes (Signed)
Tft ordered for future

## 2021-05-12 ENCOUNTER — Encounter: Payer: Self-pay | Admitting: Internal Medicine

## 2021-05-13 ENCOUNTER — Ambulatory Visit: Payer: Medicare Other | Admitting: Internal Medicine

## 2021-05-13 LAB — COLOGUARD

## 2021-05-17 ENCOUNTER — Ambulatory Visit: Payer: Self-pay | Admitting: *Deleted

## 2021-05-17 NOTE — Telephone Encounter (Signed)
Called pt and scheduled an appointment and ride for her

## 2021-05-17 NOTE — Telephone Encounter (Signed)
Summary: pain in hands and feet   Pt called saying she has pain in her hands and feet.  She is wanting to know if there is something Dr. Joya Gaskins can give her.  She said he has given her something before.   CB#  [phone]   She has an appt but it is several weeks away         Chief Complaint: requesting medication for increased pain in hands and feet  Symptoms: throbbing pain in fingers and feet , "needles"  Frequency: since MRI/CT dye approx. 08/06/20 Pertinent Negatives: Patient denies swelling , redness, no fever, no difficulty with normal activities.  Disposition: [] ED /[] Urgent Care (no appt availability in office) / [x] Appointment(In office/virtual)/ []  [place] [facility]/ [] Home Care/ [] Refused Recommended Disposition /[] [place] [facility]/ []  Follow-up with PCP Additional Notes:  Appt scheduled for 06/02/21 with assist wit transportation needed. Appt also noted for 05/28/21 for estab. Care for thyroid. Can appt be same day ? Or medication be given prior to visit . Please advise          Reason for Disposition  [1] Tingling in feet AND [2] new or increased  Answer Assessment - Initial Assessment Questions 1. ONSET: "When did the pain start?"      After MRI or CT scan dye administerd 2. LOCATION: "Where is the pain located?"      Bilateral hands and feet  3. PAIN: "How bad is the pain?"    (Scale 1-10; or mild, moderate, severe)  - MILD (1-3): doesn't interfere with normal activities.   - MODERATE (4-7): interferes with normal activities (e.g., work or school) or awakens from sleep, limping.   - SEVERE (8-10): excruciating pain, unable to do any normal activities, unable to walk.      Does not interfere with normal activities but does cause  throbbing nerve pain in fingers and feet  4. WORK OR EXERCISE: "Has there been any recent work or exercise that involved this part of the body?"      na 5. CAUSE: "What do you think is causing the foot pain?"     After MRI/  CT scan dye given  6. OTHER SYMPTOMS: "Do you have any other symptoms?" (e.g., leg pain, rash, fever, numbness)     "Needles" in fingers and feet  7. PREGNANCY: "Is there any chance you are pregnant?" "When was your last menstrual period?"     na  Protocols used: Foot Pain-A-AH

## 2021-05-17 NOTE — Telephone Encounter (Signed)
She needs to come in for thyroid labs  the reason behind the hand pain is low thyroid hormone I want to see how levels are with increased dose.  If thyroid is normal then will need referral to neurology, lab is already in place

## 2021-05-19 ENCOUNTER — Ambulatory Visit: Payer: Medicare Other

## 2021-05-21 ENCOUNTER — Other Ambulatory Visit: Payer: Self-pay

## 2021-05-21 ENCOUNTER — Encounter: Payer: Self-pay | Admitting: Internal Medicine

## 2021-05-21 ENCOUNTER — Ambulatory Visit: Payer: Commercial Managed Care - HMO | Attending: Critical Care Medicine

## 2021-05-21 DIAGNOSIS — E039 Hypothyroidism, unspecified: Secondary | ICD-10-CM | POA: Diagnosis not present

## 2021-05-22 LAB — THYROID PANEL WITH TSH
Free Thyroxine Index: 4.6 (ref 1.2–4.9)
T3 Uptake Ratio: 32 % (ref 24–39)
T4, Total: 14.4 ug/dL — ABNORMAL HIGH (ref 4.5–12.0)
TSH: 0.082 u[IU]/mL — ABNORMAL LOW (ref 0.450–4.500)

## 2021-05-24 ENCOUNTER — Other Ambulatory Visit: Payer: Self-pay | Admitting: Critical Care Medicine

## 2021-05-24 DIAGNOSIS — R202 Paresthesia of skin: Secondary | ICD-10-CM

## 2021-05-24 DIAGNOSIS — E039 Hypothyroidism, unspecified: Secondary | ICD-10-CM

## 2021-05-24 MED ORDER — LEVOTHYROXINE SODIUM 150 MCG PO TABS: 150.0000 ug | ORAL_TABLET | Freq: Every day | ORAL | 4 refills | Status: DC

## 2021-05-28 ENCOUNTER — Ambulatory Visit: Payer: Commercial Managed Care - HMO | Admitting: Internal Medicine

## 2021-05-28 ENCOUNTER — Encounter: Payer: Self-pay | Admitting: Neurology

## 2021-06-01 ENCOUNTER — Encounter: Payer: Self-pay | Admitting: Internal Medicine

## 2021-06-02 ENCOUNTER — Ambulatory Visit: Payer: Medicare Other | Admitting: Physician Assistant

## 2021-06-03 ENCOUNTER — Ambulatory Visit: Payer: Self-pay | Admitting: *Deleted

## 2021-06-03 ENCOUNTER — Ambulatory Visit: Payer: Self-pay

## 2021-06-03 NOTE — Telephone Encounter (Signed)
° ° ° °  Chief Complaint: Hand and foot pain Symptoms: Pain Frequency: Started 1 month ago. "I have tried everything, even soaking my hands and feet." Pertinent Negatives: Patient denies numbness, has some tingling. Disposition: [] ED /[] Urgent Care (no appt availability in office) / [] Appointment(In office/virtual)/ []  [place] [facility]/ [] Home Care/ [] Refused Recommended Disposition /[] [place] [facility]/ []  Follow-up with PCP Additional Notes: Pt. Asking for something "for the pain be sent to my drug store." Please advise pt.  Answer Assessment - Initial Assessment Questions 1. ONSET: "When did the pain start?"      1 month ago 2. LOCATION: "Where is the pain located?"      Both feet 3. PAIN: "How bad is the pain?"    (Scale 1-10; or mild, moderate, severe)  - MILD (1-3): doesn't interfere with normal activities.   - MODERATE (4-7): interferes with normal activities (e.g., work or school) or awakens from sleep, limping.   - SEVERE (8-10): excruciating pain, unable to do any normal activities, unable to walk.      9 4. WORK OR EXERCISE: "Has there been any recent work or exercise that involved this part of the body?"      No 5. CAUSE: "What do you think is causing the foot pain?"     Unsure 6. OTHER SYMPTOMS: "Do you have any other symptoms?" (e.g., leg pain, rash, fever, numbness)     Hands hurt as well 7. PREGNANCY: "Is there any chance you are pregnant?" "When was your last menstrual period?"     No  Protocols used: Foot Pain-A-AH

## 2021-06-03 NOTE — Telephone Encounter (Signed)
Reason for Disposition  [1] Caller requesting NON-URGENT health information AND [2] PCP's office is the best resource    Requesting pain medication prior to appt on 06/08/2021  Answer Assessment - Initial Assessment Questions 1. REASON FOR CALL or QUESTION: "What is your reason for calling today?" or "How can I best help you?" or "What question do you have that I can help answer?"     Pt requesting pain medicine for the pain in her hands and feet.   She has an appt with Dr. Joya Gaskins on 06/08/2021 but she is requesting something for the pain before then.  I read her the message regarding this being addressed during her OV on 1/31 however she has called back in requesting something be called in for her before then.  I let her know I would send her message to Dr. Joya Gaskins and someone would call her back.   She was agreeable to this plan.  Protocols used: Information Only Call - No Triage-A-AH

## 2021-06-03 NOTE — Telephone Encounter (Signed)
Appt. Scheduled for 1/31. Can address at [place].

## 2021-06-04 NOTE — Telephone Encounter (Addendum)
Went over lab results concerning pre diabetes and vitamin D levels with patient.  Patient instructed to pick up refill for Vitamin D.    Patient states at last OV, pain medication was discarded in the garbage can by provider and she did not get a prescription for pain in return.   Advised patient to use tolerable heat pad to extremities, elevate extremities and to alternate Tylenol and Advil for pain. If pain progress to f/u with UC. Patient verbalized understanding.   Reminded patient of new site address for appt.

## 2021-06-07 NOTE — Progress Notes (Deleted)
Established Patient Office Visit  Subjective:  Patient ID: Cabella Kimm, female    DOB: Jul 25, 1955  Age: 66 y.o. MRN: [id number]  CC: No chief complaint on file.   HPI Jakki Doughty presents for  Hand pain  Low thyroid  Mammo cologuard    Past Medical History:  Diagnosis Date   Back pain    Cancer ([place])    Lung Cancer   GERD (gastroesophageal reflux disease)    Gout    no current problems per patient 05/31/19   History of epigastric pain    comes and goes   Hypertension    no meds    Past Surgical History:  Procedure Laterality Date   INTERCOSTAL NERVE BLOCK Right 12/30/2019   Procedure: INTERCOSTAL NERVE BLOCK;  Surgeon: Melrose Nakayama, MD;  Location: [place];  Service: Thoracic;  Laterality: Right;   LYMPH NODE DISSECTION Right 12/30/2019   Procedure: LYMPH NODE DISSECTION;  Surgeon: Melrose Nakayama, MD;  Location: [place];  Service: Thoracic;  Laterality: Right;   MULTIPLE TOOTH EXTRACTIONS     TONSILLECTOMY     uterine ablation     VIDEO BRONCHOSCOPY N/A 12/30/2019   Procedure: VIDEO BRONCHOSCOPY;  Surgeon: Melrose Nakayama, MD;  Location: [place] [facility];  Service: Thoracic;  Laterality: N/A;   VIDEO BRONCHOSCOPY WITH ENDOBRONCHIAL ULTRASOUND N/A 06/04/2019   Procedure: VIDEO BRONCHOSCOPY WITH ENDOBRONCHIAL ULTRASOUND;  Surgeon: Margaretha Seeds, MD;  Location: [place];  Service: Thoracic;  Laterality: N/A;   WISDOM TOOTH EXTRACTION      Family History  Problem Relation Age of Onset   Breast cancer Mother    Cancer Mother    Seizures Father    Breast cancer Maternal Grandmother    Breast cancer Daughter    Cancer Daughter    Heart attack Brother     Social History   Socioeconomic History   Marital status: Divorced    Spouse name: Not on file   Number of children: 5   Years of education: Not on file   Highest education level: Not on file  Occupational History   Occupation: Childcare  Tobacco Use   Smoking status:  Former    Packs/day: 0.50    Years: 47.00    Pack years: 23.50    Types: Cigarettes    Quit date: 07/07/2019    Years since quitting: 1.9   Smokeless tobacco: Never  Vaping Use   Vaping Use: Never used  Substance and Sexual Activity   Alcohol use: No    Alcohol/week: 0.0 standard drinks   Drug use: No    Comment: last use Wed 05/28/19   Sexual activity: Not on file  Other Topics Concern   Not on file  Social History Narrative   Not on file   Social Determinants of Health   Financial Resource Strain: Not on file  Food Insecurity: Not on file  Transportation Needs: Not on file  Physical Activity: Not on file  Stress: Not on file  Social Connections: Not on file  Intimate Partner Violence: Not on file    Outpatient Medications Prior to Visit  Medication Sig Dispense Refill   acetaminophen (TYLENOL) 500 MG tablet Take 2 tablets (1,000 mg total) by mouth every 6 (six) hours. 30 tablet 0   levothyroxine (SYNTHROID) 150 MCG tablet Take 1 tablet (150 mcg total) by mouth daily before breakfast. 30 tablet 4   Multiple Vitamin (MULTIVITAMIN WITH MINERALS) TABS tablet Take 1 tablet by mouth daily.  omeprazole (PRILOSEC) 20 MG capsule Take 1 capsule (20 mg total) by mouth daily. 60 capsule 4   Vitamin D, Ergocalciferol, (DRISDOL) 1.25 MG (50000 UNIT) CAPS capsule Take 1 capsule (50,000 Units total) by mouth every 7 (seven) days. 16 capsule 3   No facility-administered medications prior to visit.    No Known Allergies  ROS Review of Systems    Objective:    Physical Exam  LMP  (LMP Unknown)  Wt Readings from Last 3 Encounters:  04/06/21 194 lb 6.4 oz (88.2 kg)  03/10/21 210 lb (95.3 kg)  02/03/21 212 lb (96.2 kg)     Health Maintenance Due  Topic Date Due   Fecal DNA (Cologuard)  Never done   MAMMOGRAM  10/19/2018    There are no preventive care reminders to display for this patient.  Lab Results  Component Value Date   TSH 0.082 (L) 05/21/2021    Lab Results  Component Value Date   WBC 2.2 (LL) 04/06/2021   HGB 11.7 04/06/2021   HCT 34.1 04/06/2021   MCV 100 (H) 04/06/2021   PLT 355 04/06/2021   Lab Results  Component Value Date   NA 135 04/06/2021   K 4.2 04/06/2021   CO2 20 04/06/2021   GLUCOSE 110 (H) 04/06/2021   BUN 18 04/06/2021   CREATININE 1.21 (H) 04/06/2021   BILITOT 0.4 04/06/2021   ALKPHOS 97 04/06/2021   AST 20 04/06/2021   ALT 12 04/06/2021   PROT 8.4 04/06/2021   ALBUMIN 5.1 (H) 04/06/2021   CALCIUM 9.8 04/06/2021   ANIONGAP 10 07/31/2020   EGFR 49 (L) 04/06/2021   Lab Results  Component Value Date   CHOL 234 (H) 04/06/2021   Lab Results  Component Value Date   HDL 32 (L) 04/06/2021   Lab Results  Component Value Date   LDLCALC 178 (H) 04/06/2021   Lab Results  Component Value Date   TRIG 131 04/06/2021   Lab Results  Component Value Date   CHOLHDL 7.3 (H) 04/06/2021   Lab Results  Component Value Date   HGBA1C 6.2 (H) 03/10/2021      Assessment & Plan:   Problem List Items Addressed This Visit   None   No orders of the defined types were placed in this encounter.   Follow-up: No follow-ups on file.    Asencion Noble, MD

## 2021-06-08 ENCOUNTER — Ambulatory Visit: Payer: Medicare Other | Admitting: Critical Care Medicine

## 2021-06-08 NOTE — Telephone Encounter (Signed)
Pt calling to request pain meds as previously. States "My ride did not show up to take me to appt today." States has tried all recommendations from [place], ineffective. Declines UC, "Can't get there." Reports pain in hands and feet like "Pins and needles." Assured pt NT would route to practice for PCPs review and final disposition. Advised would probably need to be seen. Pt has appt 07/27/21.

## 2021-06-09 ENCOUNTER — Other Ambulatory Visit: Payer: Self-pay | Admitting: Critical Care Medicine

## 2021-06-09 MED ORDER — PREGABALIN 75 MG PO CAPS
75.0000 mg | ORAL_CAPSULE | Freq: Two times a day (BID) | ORAL | 4 refills | Status: DC
Start: 2021-06-09 — End: 2022-07-26

## 2021-06-09 NOTE — Progress Notes (Signed)
Pt with ongoing neuropathic pain, failed gabapentin  neuro appt not until end of march  Plan lyrica trial 75mg  bid [facility] her [facility]  PDMP checked no drug diversion seen

## 2021-06-09 NOTE — Telephone Encounter (Addendum)
fyi

## 2021-06-09 NOTE — Telephone Encounter (Signed)
See other encounter  Rx sent

## 2021-06-09 NOTE — Telephone Encounter (Signed)
Pt is aware of note

## 2021-07-27 ENCOUNTER — Ambulatory Visit: Payer: Medicare Other | Admitting: Critical Care Medicine

## 2021-07-30 ENCOUNTER — Ambulatory Visit: Payer: Commercial Managed Care - HMO | Admitting: Neurology

## 2021-08-03 ENCOUNTER — Inpatient Hospital Stay: Payer: Medicare Other

## 2021-08-05 ENCOUNTER — Inpatient Hospital Stay: Payer: Medicare Other | Admitting: Internal Medicine

## 2021-08-05 ENCOUNTER — Telehealth: Payer: Self-pay

## 2021-08-05 NOTE — Telephone Encounter (Signed)
Notification received from scheduling that pt has concerns about getting her CT scan. ? ?I've called the pt to gain clarification of her concerns. Pt states she has numbness and tingling in her hands and feet and was told she is diabetic and feels getting the scan will make it worse. Pt further states her numbness/tingling in her hands and feet did not start until her last scan (02/02/21). Pt was advised she has had multiple scans and hasn't had an issues until she was advised she is diabetic. Pt states it doesn't matter because she is afraid. ? ?Pt states she declines having the scan and has "already had surgery and completed treatment and if that isn't good enough then it's just ment to be" and this should be respected. ? ?I advised the pt I would update Dr. Julien Nordmann and she thanked me for the call. ?

## 2021-08-06 ENCOUNTER — Encounter: Payer: Self-pay | Admitting: Internal Medicine

## 2021-10-11 ENCOUNTER — Other Ambulatory Visit: Payer: Self-pay | Admitting: Physician Assistant

## 2021-10-11 DIAGNOSIS — R202 Paresthesia of skin: Secondary | ICD-10-CM

## 2021-10-28 ENCOUNTER — Other Ambulatory Visit: Payer: Self-pay | Admitting: Critical Care Medicine

## 2021-10-28 DIAGNOSIS — E039 Hypothyroidism, unspecified: Secondary | ICD-10-CM

## 2021-11-29 ENCOUNTER — Other Ambulatory Visit: Payer: Self-pay

## 2021-12-29 ENCOUNTER — Encounter: Payer: Self-pay | Admitting: Internal Medicine

## 2022-01-06 ENCOUNTER — Other Ambulatory Visit: Payer: Self-pay

## 2022-01-07 ENCOUNTER — Ambulatory Visit: Payer: Medicare (Managed Care) | Admitting: Neurology

## 2022-01-30 ENCOUNTER — Other Ambulatory Visit: Payer: Self-pay | Admitting: Critical Care Medicine

## 2022-01-30 DIAGNOSIS — E039 Hypothyroidism, unspecified: Secondary | ICD-10-CM

## 2022-01-31 ENCOUNTER — Other Ambulatory Visit: Payer: Self-pay | Admitting: Critical Care Medicine

## 2022-01-31 DIAGNOSIS — E039 Hypothyroidism, unspecified: Secondary | ICD-10-CM

## 2022-01-31 NOTE — Telephone Encounter (Signed)
Requested medications are due for refill today.  yes  Requested medications are on the active medications list.  yes  Last refill. 05/24/2021 #30 4 rf  Future visit scheduled.   no  Notes to clinic.  Abnormal labs.    Requested Prescriptions  Pending Prescriptions Disp Refills   levothyroxine (SYNTHROID) 150 MCG tablet [Pharmacy Med Name: LEVOTHYROXINE 0.150MG  (150MCG) TAB] 30 tablet 4    Sig: TAKE 1 TABLET(150 MCG) BY MOUTH DAILY BEFORE AND BREAKFAST     Endocrinology:  Hypothyroid Agents Failed - 01/30/2022  8:31 AM      Failed - TSH in normal range and within 360 days    TSH  Date Value Ref Range Status  05/21/2021 0.082 (L) 0.450 - 4.500 uIU/mL Final         Passed - Valid encounter within last 12 months    Recent Outpatient Visits           10 months ago Hypothyroidism, unspecified type   [place] Elsie Stain, MD   10 months ago [place], [place], [place]   2 years ago Adenocarcinoma of right lung [facility])   [place], [place], MD   2 years ago Sepsis, due to unspecified organism, unspecified whether acute organ dysfunction present [facility])   [place], [place], [place]   2 years ago Vaginal discharge   [place], MD       Future Appointments             In 1 month Alda Berthold, [facility] [place]

## 2022-02-01 NOTE — Telephone Encounter (Signed)
Refilled 01/31/2022 #30 0 rf. Requested Prescriptions  Pending Prescriptions Disp Refills  . levothyroxine (SYNTHROID) 150 MCG tablet [Pharmacy Med Name: LEVOTHYROXINE 0.150MG  (150MCG) TAB] 90 tablet     Sig: TAKE 1 TABLET BY MOUTH DAILY BEFORE AND BREAKFAST     Endocrinology:  Hypothyroid Agents Failed - 01/31/2022  2:11 PM      Failed - TSH in normal range and within 360 days    TSH  Date Value Ref Range Status  05/21/2021 0.082 (L) 0.450 - 4.500 uIU/mL Final         Passed - Valid encounter within last 12 months    Recent Outpatient Visits          10 months ago Hypothyroidism, unspecified type   [place] Elsie Stain, MD   10 months ago [place], [place], [place]   2 years ago Adenocarcinoma of right lung [facility])   [place], Enobong, MD   2 years ago Sepsis, due to unspecified organism, unspecified whether acute organ dysfunction present [facility])   [place], [place], [place]   2 years ago Vaginal discharge   [place], [place], [place]      Future Appointments            In 1 month Alda Berthold, [facility] [place]

## 2022-03-11 ENCOUNTER — Encounter: Payer: Self-pay | Admitting: Internal Medicine

## 2022-03-13 ENCOUNTER — Other Ambulatory Visit: Payer: Self-pay | Admitting: Critical Care Medicine

## 2022-03-13 DIAGNOSIS — E039 Hypothyroidism, unspecified: Secondary | ICD-10-CM

## 2022-03-14 NOTE — Telephone Encounter (Signed)
Requested medications are due for refill today.  yes  Requested medications are on the active medications list.  yes  Last refill. 01/31/2022 #30 0 rf  Future visit scheduled.   no  Notes to clinic.  Abnormal labs. Already given a courtesy refill.    Requested Prescriptions  Pending Prescriptions Disp Refills   levothyroxine (SYNTHROID) 150 MCG tablet [Pharmacy Med Name: LEVOTHYROXINE 0.150MG  (150MCG) TAB] 30 tablet 0    Sig: TAKE 1 TABLET BY MOUTH DAILY BEFORE BREAKFAST     Endocrinology:  Hypothyroid Agents Failed - 03/13/2022 10:30 AM      Failed - TSH in normal range and within 360 days    TSH  Date Value Ref Range Status  05/21/2021 0.082 (L) 0.450 - 4.500 uIU/mL Final         Passed - Valid encounter within last 12 months    Recent Outpatient Visits           11 months ago Hypothyroidism, unspecified type   [place] Elsie Stain, MD   1 year ago [place], [place], [place]   2 years ago Adenocarcinoma of right lung [facility])   [place], Enobong, MD   2 years ago Sepsis, due to unspecified organism, unspecified whether acute organ dysfunction present [facility])   [place], [place], [place]   3 years ago Vaginal discharge   [place], MD       Future Appointments             In 4 days Alda Berthold, [place] [facility] [place]

## 2022-03-18 ENCOUNTER — Ambulatory Visit: Payer: Self-pay | Admitting: *Deleted

## 2022-03-18 ENCOUNTER — Other Ambulatory Visit: Payer: Self-pay

## 2022-03-18 ENCOUNTER — Ambulatory Visit: Payer: Medicare Other | Admitting: Neurology

## 2022-03-18 NOTE — Telephone Encounter (Signed)
  Chief Complaint: abdominal pain  Symptoms: abdominal pain in center of abdomen. Constant now . Nausea. Reflux medication helps for short amount of time but pain returns. Level pain 8 Frequency: couple of days  Pertinent Negatives: Patient denies chest pain no fever, no severe pain, no vomiting no constipation no diarrhea.  Disposition: [] ED /[x] Urgent Care (no appt availability in office) / [] Appointment(In office/virtual)/ []  [place] [facility]/ [] Home Care/ [] Refused Recommended Disposition /[]  Mobile Bus/ []  Follow-up with PCP Additional Notes:   recommended  UC no available appt.      Reason for Disposition  [1] MILD-MODERATE pain AND [2] constant AND [3] present > 2 hours  Answer Assessment - Initial Assessment Questions 1. LOCATION: "Where does it hurt?"      Around middle around "belly button" 2. RADIATION: "Does the pain shoot anywhere else?" (e.g., chest, back)     no 3. ONSET: "When did the pain begin?" (e.g., minutes, hours or days ago)      Couple of days ago  4. SUDDEN: "Gradual or sudden onset?"     Gradual  5. PATTERN "Does the pain come and go, or is it constant?"    - If it comes and goes: "How long does it last?" "Do you have pain now?"     (Note: Comes and goes means the pain is intermittent. It goes away completely between bouts.)    - If constant: "Is it getting better, staying the same, or getting worse?"      (Note: Constant means the pain never goes away completely; most serious pain is constant and gets worse.)      Constant  6. SEVERITY: "How bad is the pain?"  (e.g., Scale 1-10; mild, moderate, or severe)    - MILD (1-3): Doesn't interfere with normal activities, abdomen soft and not tender to touch.     - MODERATE (4-7): Interferes with normal activities or awakens from sleep, abdomen tender to touch.     - SEVERE (8-10): Excruciating pain, doubled over, unable to do any normal activities.       Moderate difficulty sleeping and wakes  with pain pain level 8  7. RECURRENT SYMPTOM: "Have you ever had this type of stomach pain before?" If Yes, ask: "When was the last time?" and "What happened that time?"      Yes  8. CAUSE: "What do you think is causing the stomach pain?"     Not sure  9. RELIEVING/AGGRAVATING FACTORS: "What makes it better or worse?" (e.g., antacids, bending or twisting motion, bowel movement)     Takes acid reflux medication eases small amount but pain returns  10. OTHER SYMPTOMS: "Do you have any other symptoms?" (e.g., back pain, diarrhea, fever, urination pain, vomiting)       Nausea urinary frequency  11. PREGNANCY: "Is there any chance you are pregnant?" "When was your last menstrual period?"       na  Protocols used: Abdominal Pain - [facility]

## 2022-04-12 ENCOUNTER — Other Ambulatory Visit: Payer: Self-pay | Admitting: Critical Care Medicine

## 2022-04-12 NOTE — Telephone Encounter (Signed)
Courtesy refill. Patient will need an office visit for further refills. Requested Prescriptions  Pending Prescriptions Disp Refills   omeprazole (PRILOSEC) 20 MG capsule [Pharmacy Med Name: OMEPRAZOLE 20MG  CAPSULES] 30 capsule 0    Sig: TAKE 1 CAPSULE(20 MG) BY MOUTH DAILY     Gastroenterology: Proton Pump Inhibitors Failed - 04/12/2022  3:08 AM      Failed - Valid encounter within last 12 months    Recent Outpatient Visits           1 year ago Hypothyroidism, unspecified type   [place] Elsie Stain, MD   1 year ago [place], [place], [place]   2 years ago Adenocarcinoma of right lung [facility])   [place], Enobong, MD   2 years ago Sepsis, due to unspecified organism, unspecified whether acute organ dysfunction present [facility])   [place] [address], [place], [place]   3 years ago Vaginal discharge   [place], [place], MD       Future Appointments             In 1 week Alda Berthold, [facility] [place]

## 2022-04-12 NOTE — Telephone Encounter (Signed)
Called pt to make appt. Pt will need to call back when she has her schedule.

## 2022-04-22 ENCOUNTER — Ambulatory Visit: Payer: Medicare Other | Admitting: Neurology

## 2022-04-24 ENCOUNTER — Other Ambulatory Visit: Payer: Self-pay

## 2022-04-27 ENCOUNTER — Ambulatory Visit: Payer: Medicare Other | Admitting: Neurology

## 2022-04-27 ENCOUNTER — Other Ambulatory Visit: Payer: Self-pay

## 2022-05-09 ENCOUNTER — Other Ambulatory Visit: Payer: Self-pay | Admitting: Critical Care Medicine

## 2022-05-10 NOTE — Telephone Encounter (Signed)
Requested medication (s) are due for refill today: YES  Requested medication (s) are on the active medication list: yes  Last refill:  04/12/22  Future visit scheduled: no  Notes to clinic:  Unable to refill per protocol, courtesy refill already given, routing for provider approval.      Requested Prescriptions  Pending Prescriptions Disp Refills   omeprazole (PRILOSEC) 20 MG capsule [Pharmacy Med Name: OMEPRAZOLE 20MG  CAPSULES] 30 capsule 0    Sig: TAKE 1 CAPSULE(20 MG) BY MOUTH DAILY     Gastroenterology: Proton Pump Inhibitors Failed - 05/09/2022  3:08 AM      Failed - Valid encounter within last 12 months    Recent Outpatient Visits           1 year ago Hypothyroidism, unspecified type   [place] Elsie Stain, MD   1 year ago [place], [place], [place]   2 years ago Adenocarcinoma of right lung [facility])   [place], En[place]ong, MD   2 years ago Sepsis, due to unspecified organism, unspecified whether acute organ dysfunction present [facility])   [place], [place], [place]   3 years ago Vaginal discharge   [place], [place], MD       Future Appointments             In 1 month Alda Berthold, [facility] [place]

## 2022-05-21 ENCOUNTER — Encounter: Payer: Self-pay | Admitting: Internal Medicine

## 2022-05-25 ENCOUNTER — Telehealth: Payer: Self-pay | Admitting: Critical Care Medicine

## 2022-05-25 NOTE — Telephone Encounter (Signed)
Left message for patient to call back and schedule Medicare Annual Wellness Visit (AWV) either virtually or phone . Left  my Zachery Conch number [phone]   awvi 02/06/21 per [place]

## 2022-06-18 ENCOUNTER — Other Ambulatory Visit: Payer: Self-pay

## 2022-06-20 ENCOUNTER — Ambulatory Visit: Payer: 59 | Admitting: Neurology

## 2022-06-26 ENCOUNTER — Emergency Department (HOSPITAL_BASED_OUTPATIENT_CLINIC_OR_DEPARTMENT_OTHER)
Admission: EM | Admit: 2022-06-26 | Discharge: 2022-06-26 | Disposition: A | Discharge status: Home / Self Care | Payer: 59 | Attending: Emergency Medicine | Admitting: Emergency Medicine

## 2022-06-26 ENCOUNTER — Emergency Department (HOSPITAL_BASED_OUTPATIENT_CLINIC_OR_DEPARTMENT_OTHER): Payer: 59

## 2022-06-26 ENCOUNTER — Encounter (HOSPITAL_BASED_OUTPATIENT_CLINIC_OR_DEPARTMENT_OTHER): Payer: Self-pay

## 2022-06-26 DIAGNOSIS — R1084 Generalized abdominal pain: Secondary | ICD-10-CM

## 2022-06-26 DIAGNOSIS — R1013 Epigastric pain: Secondary | ICD-10-CM | POA: Insufficient documentation

## 2022-06-26 DIAGNOSIS — R1011 Right upper quadrant pain: Secondary | ICD-10-CM | POA: Diagnosis not present

## 2022-06-26 DIAGNOSIS — K5730 Diverticulosis of large intestine without perforation or abscess without bleeding: Secondary | ICD-10-CM | POA: Diagnosis not present

## 2022-06-26 DIAGNOSIS — R109 Unspecified abdominal pain: Secondary | ICD-10-CM | POA: Diagnosis not present

## 2022-06-26 DIAGNOSIS — R1010 Upper abdominal pain, unspecified: Secondary | ICD-10-CM | POA: Diagnosis present

## 2022-06-26 LAB — COMPREHENSIVE METABOLIC PANEL WITH GFR
ALT: 7 U/L (ref 0–44)
AST: 13 U/L — ABNORMAL LOW (ref 15–41)
Albumin: 4.7 g/dL (ref 3.5–5.0)
Alkaline Phosphatase: 60 U/L (ref 38–126)
Anion gap: 13 (ref 5–15)
BUN: 12 mg/dL (ref 8–23)
CO2: 23 mmol/L (ref 22–32)
Calcium: 10 mg/dL (ref 8.9–10.3)
Chloride: 97 mmol/L — ABNORMAL LOW (ref 98–111)
Creatinine, Ser: 1.08 mg/dL — ABNORMAL HIGH (ref 0.44–1.00)
GFR, Estimated: 56 mL/min — ABNORMAL LOW
Glucose, Bld: 108 mg/dL — ABNORMAL HIGH (ref 70–99)
Potassium: 3.6 mmol/L (ref 3.5–5.1)
Sodium: 133 mmol/L — ABNORMAL LOW (ref 135–145)
Total Bilirubin: 1.8 mg/dL — ABNORMAL HIGH (ref 0.3–1.2)
Total Protein: 8.5 g/dL — ABNORMAL HIGH (ref 6.5–8.1)

## 2022-06-26 LAB — LIPASE, BLOOD: Lipase: <10 U/L — ABNORMAL LOW (ref 11–51)

## 2022-06-26 LAB — CBC
HCT: 37.3 % (ref 36.0–46.0)
Hemoglobin: 13.3 g/dL (ref 12.0–15.0)
MCH: 34.4 pg — ABNORMAL HIGH (ref 26.0–34.0)
MCHC: 35.7 g/dL (ref 30.0–36.0)
MCV: 96.4 fL (ref 80.0–100.0)
Platelets: 301 10*3/uL (ref 150–400)
RBC: 3.87 MIL/uL (ref 3.87–5.11)
RDW: 14.6 % (ref 11.5–15.5)
WBC: 7.2 10*3/uL (ref 4.0–10.5)
nRBC: 0 % (ref 0.0–0.2)

## 2022-06-26 LAB — URINALYSIS, ROUTINE W REFLEX MICROSCOPIC
Bilirubin Urine: NEGATIVE
Glucose, UA: NEGATIVE mg/dL
Nitrite: NEGATIVE
Protein, ur: 30 mg/dL — AB
Specific Gravity, Urine: 1.023 (ref 1.005–1.030)
pH: 5.5 (ref 5.0–8.0)

## 2022-06-26 MED ORDER — MORPHINE SULFATE (PF) 4 MG/ML IV SOLN
4.0000 mg | Freq: Once | INTRAVENOUS | Status: AC
  Administered 2022-06-26: 4 mg via INTRAVENOUS
  Filled 2022-06-26: qty 1

## 2022-06-26 MED ORDER — ONDANSETRON 4 MG PO TBDP: ORAL_TABLET | ORAL | 0 refills | Status: DC

## 2022-06-26 MED ORDER — IOHEXOL 300 MG/ML  SOLN: 100.0000 mL | Freq: Once | INTRAMUSCULAR | Status: DC | PRN

## 2022-06-26 MED ORDER — SODIUM CHLORIDE 0.9 % IV BOLUS
1000.0000 mL | Freq: Once | INTRAVENOUS | Status: AC
  Administered 2022-06-26: 1000 mL via INTRAVENOUS

## 2022-06-26 MED ORDER — ONDANSETRON HCL 4 MG/2ML IJ SOLN
4.0000 mg | Freq: Once | INTRAMUSCULAR | Status: AC
  Administered 2022-06-26: 4 mg via INTRAVENOUS
  Filled 2022-06-26: qty 2

## 2022-06-26 NOTE — ED Provider Notes (Signed)
[place] Provider Note   CSN: [id number] Arrival date & time: 06/26/22  2016     History  Chief Complaint  Patient presents with   Abdominal Pain    Janice Adams is a 67 y.o. female.  67 yo F with a chief complaints of abdominal pain.  She had episode like this couple weeks ago and then it spontaneously resolved.  Tells me that it is worse to the upper part of the abdomen.  Nothing seems to make it better or worse.  Denies cough congestion or fever.  Denies nausea vomiting or diarrhea.  Denies constipation denies urinary symptoms.   Abdominal Pain      Home Medications Prior to Admission medications   Medication Sig Start Date End Date Taking? Authorizing Provider  gabapentin (NEURONTIN) 300 MG capsule TAKE 1 CAPSULE(300 MG) BY MOUTH AT BEDTIME 10/11/21   Elsie Stain, MD  ondansetron (ZOFRAN-ODT) 4 MG disintegrating tablet 4mg  ODT q4 hours prn nausea/vomit 06/26/22  Yes Deno Etienne, DO  acetaminophen (TYLENOL) 500 MG tablet Take 2 tablets (1,000 mg total) by mouth every 6 (six) hours. 01/03/20   Elgie Collard, PA-C  levothyroxine (SYNTHROID) 150 MCG tablet TAKE 1 TABLET(150 MCG) BY MOUTH DAILY BEFORE AND BREAKFAST 01/31/22   Elsie Stain, MD  Multiple Vitamin (MULTIVITAMIN WITH MINERALS) TABS tablet Take 1 tablet by mouth daily.    [provider]  omeprazole (PRILOSEC) 20 MG capsule TAKE 1 CAPSULE(20 MG) BY MOUTH DAILY 04/12/22   Elsie Stain, MD  pregabalin (LYRICA) 75 MG capsule Take 1 capsule (75 mg total) by mouth 2 (two) times daily. 06/09/21   Elsie Stain, MD  Vitamin D, Ergocalciferol, (DRISDOL) 1.25 MG (50000 UNIT) CAPS capsule Take 1 capsule (50,000 Units total) by mouth every 7 (seven) days. 04/06/21   Elsie Stain, MD      Allergies    Patient has no known allergies.    Review of Systems   Review of Systems  Gastrointestinal:  Positive for abdominal pain.    Physical Exam Updated  Vital Signs BP 113/85   Pulse 96   Temp 98.4 F (36.9 C)   Resp 18   LMP  (LMP Unknown)   SpO2 97%  Physical Exam Vitals and nursing note reviewed.  Constitutional:      General: She is not in acute distress.    Appearance: She is well-developed. She is not diaphoretic.  HENT:     Head: Normocephalic and atraumatic.  Eyes:     Pupils: Pupils are equal, round, and reactive to light.  Cardiovascular:     Rate and Rhythm: Normal rate and regular rhythm.     Heart sounds: No murmur heard.    No friction rub. No gallop.  Pulmonary:     Effort: Pulmonary effort is normal.     Breath sounds: No wheezing or rales.  Abdominal:     General: There is no distension.     Palpations: Abdomen is soft.     Tenderness: There is abdominal tenderness.     Comments: Pain to the epigastrium and right upper quadrant.  Negative Murphy sign.  Musculoskeletal:        General: No tenderness.     Cervical back: Normal range of motion and neck supple.  Skin:    General: Skin is warm and dry.  Neurological:     Mental Status: She is alert and oriented to person, place, and time.  Psychiatric:  Behavior: Behavior normal.     ED Results / Procedures / Treatments   Labs (all labs ordered are listed, but only abnormal results are displayed) Labs Reviewed  LIPASE, BLOOD - Abnormal; Notable for the following components:      Result Value   Lipase <10 (*)    All other components within normal limits  COMPREHENSIVE METABOLIC PANEL - Abnormal; Notable for the following components:   Sodium 133 (*)    Chloride 97 (*)    Glucose, Bld 108 (*)    Creatinine, Ser 1.08 (*)    Total Protein 8.5 (*)    AST 13 (*)    Total Bilirubin 1.8 (*)    GFR, Estimated 56 (*)    All other components within normal limits  CBC - Abnormal; Notable for the following components:   MCH 34.4 (*)    All other components within normal limits  URINALYSIS, ROUTINE W REFLEX MICROSCOPIC - Abnormal; Notable for the  following components:   Color, Urine ORANGE (*)    Hgb urine dipstick SMALL (*)    Ketones, ur TRACE (*)    Protein, ur 30 (*)    Leukocytes,Ua SMALL (*)    Bacteria, UA RARE (*)    All other components within normal limits    EKG None  Radiology CT ABDOMEN PELVIS WO CONTRAST  Result Date: 06/26/2022 CLINICAL DATA:  Abdominal pain, acute, nonlocalized EXAM: CT ABDOMEN AND PELVIS WITHOUT CONTRAST TECHNIQUE: Multidetector CT imaging of the abdomen and pelvis was performed following the standard protocol without IV contrast. RADIATION DOSE REDUCTION: This exam was performed according to the departmental dose-optimization program which includes automated exposure control, adjustment of the mA and/or kV according to patient size and/or use of iterative reconstruction technique. COMPARISON:  CT chest 02/02/2021, PET CT 06/11/2019 FINDINGS: Lower chest: Similar-appearing loculated right pleural effusion with pleural thickening. Similarly partially visualized right lung bronchiectasis and architectural distortion. Small hiatal hernia. Hepatobiliary: No focal liver abnormality. No gallstones, gallbladder wall thickening, or pericholecystic fluid. No biliary dilatation. Pancreas: No focal lesion. Normal pancreatic contour. No surrounding inflammatory changes. No main pancreatic ductal dilatation. Spleen: Normal in size without focal abnormality. Adrenals/Urinary Tract: No adrenal nodule bilaterally. No nephrolithiasis and no hydronephrosis. No definite contour-deforming renal mass. No ureterolithiasis or hydroureter. The urinary bladder is unremarkable. Stomach/Bowel: Stomach is within normal limits. No evidence of bowel wall thickening or dilatation. Scattered colonic diverticulosis. Appendix appears normal. Vascular/Lymphatic: No abdominal aorta or iliac aneurysm. Moderate - severe atherosclerotic plaque of the aorta and its branches. No abdominal, pelvic, or inguinal lymphadenopathy. Reproductive: Grossly  similar-appearing Coarsely calcified uterine fibroids. Otherwise uterus and bilateral adnexa are unremarkable. Other: No intraperitoneal free fluid. No intraperitoneal free gas. No organized fluid collection. Musculoskeletal: No hernia. Chronic oval subcutaneus soft tissue density along the lower anterior pelvic soft tissues (2:67). No suspicious lytic or blastic osseous lesions. No acute displaced fracture. IMPRESSION: 1. No acute intra-abdominal intrapelvic abnormality with limited evaluation on this noncontrast study. 2. Colonic diverticulosis with no acute diverticulitis. 3. Degenerative uterine fibroids. 4. Small hiatal hernia. 5. Partially visualized chronic changes of the right lung. 6.  Aortic Atherosclerosis (ICD10-I70.0). Electronically Signed   By: Iven Finn M.D.   On: 06/26/2022 23:08    Procedures Procedures    Medications Ordered in ED Medications  iohexol (OMNIPAQUE) 300 MG/ML solution 100 mL (has no administration in time range)  sodium chloride 0.9 % bolus 1,000 mL (1,000 mLs Intravenous New Bag/Given 06/26/22 2214)  morphine (PF) 4 MG/ML  injection 4 mg (4 mg Intravenous Given 06/26/22 2215)  ondansetron [facility]) injection 4 mg (4 mg Intravenous Given 06/26/22 2215)    ED Course/ Medical Decision Making/ A&P                             Medical Decision Making Amount and/or Complexity of Data Reviewed Labs: ordered. Radiology: ordered.  Risk Prescription drug management.   67 yo F with a chief complaint of abdominal pain.  This been going on for about 24 hours.  She tells me it was periumbilical but about later back flat she told me it was more in the epigastrium and right upper quadrant.  LFTs and lipase are unremarkable.  Will give a bolus of IV fluids pain and nausea medicine CT.  She has no listed allergy to CT but she told me she did not want contrast dye, stated that it made her nerves hurt.  Will obtain a CT scan without.  CT scan of the abdomen pelvis  without obvious intra-abdominal pathology.  Discussed results with patient.  Continues to do well able to eat and drink without issue here.  Will treat as possible reflux.  Have her follow-up with her family doctor in the office.  11:20 PM:  I have discussed the diagnosis/risks/treatment options with the patient and family.  Evaluation and diagnostic testing in the emergency department does not suggest an emergent condition requiring admission or immediate intervention beyond what has been performed at this time.  They will follow up with PCP. We also discussed returning to the ED immediately if new or worsening sx occur. We discussed the sx which are most concerning (e.g., sudden worsening pain, fever, inability to tolerate by mouth) that necessitate immediate return. Medications administered to the patient during their visit and any new prescriptions provided to the patient are listed below.  Medications given during this visit Medications  iohexol (OMNIPAQUE) 300 MG/ML solution 100 mL (has no administration in time range)  sodium chloride 0.9 % bolus 1,000 mL (1,000 mLs Intravenous New Bag/Given 06/26/22 2214)  morphine (PF) 4 MG/ML injection 4 mg (4 mg Intravenous Given 06/26/22 2215)  ondansetron (ZOFRAN) injection 4 mg (4 mg Intravenous Given 06/26/22 2215)     The patient appears reasonably screen and/or stabilized for discharge and I doubt any other medical condition or other [facility] requiring further screening, evaluation, or treatment in the ED at this time prior to discharge.         Final Clinical Impression(s) / ED Diagnoses Final diagnoses:  Generalized abdominal pain    Rx / DC Orders ED Discharge Orders          Ordered    ondansetron (ZOFRAN-ODT) 4 MG disintegrating tablet        06/26/22 2315              Deno Etienne, DO 06/26/22 2320

## 2022-06-26 NOTE — Discharge Instructions (Signed)
You are ready to get a medicine to help you with acid.  Just to remind you this medication is post be taken right when you wake up and then enough to eat or drink something 30 minutes to an hour later otherwise it does not work properly.  He also do need to take it every day or otherwise it does not work as well as it would otherwise.  Try to avoid things that may make this worse, most commonly these are spicy foods tomato based products fatty foods chocolate and peppermint.  Alcohol and tobacco can also make this worse.  Return to the emergency department for sudden worsening pain fever or inability to eat or drink.

## 2022-06-26 NOTE — ED Triage Notes (Signed)
Pt c/o periumbilical pressure-like abd pain "all day," states she "can't take it anymore." Denies NVD, urinary symptoms. States she "tried to stick her finger down her throat to relieve some of it but that didn't work." Denies known hx, denies abd surgery

## 2022-06-26 NOTE — ED Notes (Signed)
Patient back from CT.

## 2022-06-28 ENCOUNTER — Other Ambulatory Visit: Payer: Self-pay | Admitting: Critical Care Medicine

## 2022-06-28 ENCOUNTER — Ambulatory Visit: Payer: Self-pay

## 2022-06-28 NOTE — Telephone Encounter (Signed)
Chief Complaint: Abdominal pain, lower middle Symptoms: pain 8/10 Frequency: Yesterday, seen in ED Pertinent Negatives: Patient denies other symptoms Disposition: [] ED /[] Urgent Care (no appt availability in office) / [] Appointment(In office/virtual)/ []  [place] [facility]/ [] Home Care/ [] Refused Recommended Disposition /[x] [place] Mobile [facility]/ []  Follow-up with PCP Additional Notes: Scheduled first available for refill of medications, requesting refill of omeprazole. Advised to go to the mobile bus for the abdominal pain tomorrow, she agrees. Advised I will send the request for Omeprazole to the provider and that she can also ask the provider on the mobile bus to refill.    Reason for Disposition  Age > 60 years  Answer Assessment - Initial Assessment Questions 1. LOCATION: "Where does it hurt?"      Lower abdomen in the middle 2. RADIATION: "Does the pain shoot anywhere else?" (e.g., chest, back)     No 3. ONSET: "When did the pain begin?" (e.g., minutes, hours or days ago)      Yesterday 4. SUDDEN: "Gradual or sudden onset?"     Sudden 5. PATTERN "Does the pain come and go, or is it constant?"    - If it comes and goes: "How long does it last?" "Do you have pain now?"     (Note: Comes and goes means the pain is intermittent. It goes away completely between bouts.)    - If constant: "Is it getting better, staying the same, or getting worse?"      (Note: Constant means the pain never goes away completely; most serious pain is constant and gets worse.)      Comes and goes 6. SEVERITY: "How bad is the pain?"  (e.g., Scale 1-10; mild, moderate, or severe)    - MILD (1-3): Doesn't interfere with normal activities, abdomen soft and not tender to touch.     - MODERATE (4-7): Interferes with normal activities or awakens from sleep, abdomen tender to touch.     - SEVERE (8-10): Excruciating pain, doubled over, unable to do any normal activities.       8 7. RECURRENT SYMPTOM: "Have  you ever had this type of stomach pain before?" If Yes, ask: "When was the last time?" and "What happened that time?"      Yes, just faded away, no treatment 8. CAUSE: "What do you think is causing the stomach pain?"     I don't know 9. RELIEVING/AGGRAVATING FACTORS: "What makes it better or worse?" (e.g., antacids, bending or twisting motion, bowel movement)     Nothing taken 10. OTHER SYMPTOMS: "Do you have any other symptoms?" (e.g., back pain, diarrhea, fever, urination pain, vomiting)       No other symptoms  Protocols used: Abdominal Pain - Female-A-AH

## 2022-06-29 ENCOUNTER — Other Ambulatory Visit: Payer: Self-pay

## 2022-07-01 ENCOUNTER — Other Ambulatory Visit: Payer: Self-pay

## 2022-07-01 MED ORDER — OMEPRAZOLE 20 MG PO CPDR: DELAYED_RELEASE_CAPSULE | ORAL | 0 refills | Status: DC

## 2022-07-01 NOTE — Telephone Encounter (Signed)
Medication has been refilled.

## 2022-07-06 ENCOUNTER — Other Ambulatory Visit: Payer: Self-pay | Admitting: Critical Care Medicine

## 2022-07-06 NOTE — Telephone Encounter (Unsigned)
Copied from [place] [id number]. Topic: General - Other >> Jul 06, 2022  2:06 PM Everette C wrote: Reason for CRM: Medication Refill - Medication: omeprazole (PRILOSEC) 20 MG capsule [id number]  Has the patient contacted their pharmacy? Yes.   (Agent: If no, request that the patient contact the pharmacy for the refill. If patient does not wish to contact the pharmacy document the reason why and proceed with request.) (Agent: If yes, when and what did the pharmacy advise?)  Preferred Pharmacy (with phone number or street name): [place], [address] AT [facility] [place] [address] Phone: [phone] Fax: [phone] Hours: Not open 24 hours   Has the patient been seen for an appointment in the last year OR does the patient have an upcoming appointment? Yes.    Agent: Please be advised that RX refills may take up to 3 business days. We ask that you follow-up with your pharmacy.

## 2022-07-07 NOTE — Telephone Encounter (Signed)
Unable to refill per protocol, courtesy refill already given, should have enough until scheduled OV.  Requested Prescriptions  Pending Prescriptions Disp Refills   omeprazole (PRILOSEC) 20 MG capsule 30 capsule 0    Sig: TAKE 1 CAPSULE(20 MG) BY MOUTH DAILY     Gastroenterology: Proton Pump Inhibitors Failed - 07/06/2022  2:30 PM      Failed - Valid encounter within last 12 months    Recent Outpatient Visits           1 year ago Hypothyroidism, unspecified type   [place] Elsie Stain, MD   1 year ago [place], [place], [place]   2 years ago Adenocarcinoma of right lung [facility])   [place], Enobong, MD   2 years ago Sepsis, due to unspecified organism, unspecified whether acute organ dysfunction present [facility])   [place], [place], [place]   3 years ago Vaginal discharge   [place], [place], MD       Future Appointments             In 5 days Alda Berthold, DO [facility]   In 2 weeks Elsie Stain, MD [place]

## 2022-07-09 ENCOUNTER — Inpatient Hospital Stay (HOSPITAL_BASED_OUTPATIENT_CLINIC_OR_DEPARTMENT_OTHER)
Admission: EM | Admit: 2022-07-09 | Discharge: 2022-07-11 | DRG: 641 | Disposition: A | Discharge status: Home / Self Care | Payer: 59 | Attending: Internal Medicine | Admitting: Internal Medicine

## 2022-07-09 ENCOUNTER — Other Ambulatory Visit: Payer: Self-pay

## 2022-07-09 ENCOUNTER — Emergency Department (HOSPITAL_BASED_OUTPATIENT_CLINIC_OR_DEPARTMENT_OTHER): Payer: 59

## 2022-07-09 ENCOUNTER — Encounter (HOSPITAL_BASED_OUTPATIENT_CLINIC_OR_DEPARTMENT_OTHER): Payer: Self-pay | Admitting: Emergency Medicine

## 2022-07-09 DIAGNOSIS — Z79899 Other long term (current) drug therapy: Secondary | ICD-10-CM

## 2022-07-09 DIAGNOSIS — Z803 Family history of malignant neoplasm of breast: Secondary | ICD-10-CM | POA: Diagnosis not present

## 2022-07-09 DIAGNOSIS — Z79890 Hormone replacement therapy (postmenopausal): Secondary | ICD-10-CM

## 2022-07-09 DIAGNOSIS — R410 Disorientation, unspecified: Secondary | ICD-10-CM

## 2022-07-09 DIAGNOSIS — R1084 Generalized abdominal pain: Secondary | ICD-10-CM

## 2022-07-09 DIAGNOSIS — R8271 Bacteriuria: Secondary | ICD-10-CM | POA: Diagnosis present

## 2022-07-09 DIAGNOSIS — I10 Essential (primary) hypertension: Secondary | ICD-10-CM | POA: Diagnosis not present

## 2022-07-09 DIAGNOSIS — Z87891 Personal history of nicotine dependence: Secondary | ICD-10-CM | POA: Diagnosis not present

## 2022-07-09 DIAGNOSIS — K219 Gastro-esophageal reflux disease without esophagitis: Secondary | ICD-10-CM | POA: Diagnosis not present

## 2022-07-09 DIAGNOSIS — Z91148 Patient's other noncompliance with medication regimen for other reason: Secondary | ICD-10-CM | POA: Diagnosis not present

## 2022-07-09 DIAGNOSIS — E039 Hypothyroidism, unspecified: Secondary | ICD-10-CM | POA: Diagnosis present

## 2022-07-09 DIAGNOSIS — E869 Volume depletion, unspecified: Secondary | ICD-10-CM | POA: Diagnosis present

## 2022-07-09 DIAGNOSIS — N390 Urinary tract infection, site not specified: Secondary | ICD-10-CM | POA: Diagnosis present

## 2022-07-09 DIAGNOSIS — Z91041 Radiographic dye allergy status: Secondary | ICD-10-CM

## 2022-07-09 DIAGNOSIS — E860 Dehydration: Principal | ICD-10-CM | POA: Diagnosis present

## 2022-07-09 DIAGNOSIS — R109 Unspecified abdominal pain: Secondary | ICD-10-CM | POA: Diagnosis present

## 2022-07-09 DIAGNOSIS — Z8249 Family history of ischemic heart disease and other diseases of the circulatory system: Secondary | ICD-10-CM

## 2022-07-09 DIAGNOSIS — T381X6A Underdosing of thyroid hormones and substitutes, initial encounter: Secondary | ICD-10-CM | POA: Diagnosis not present

## 2022-07-09 DIAGNOSIS — C3490 Malignant neoplasm of unspecified part of unspecified bronchus or lung: Secondary | ICD-10-CM | POA: Diagnosis not present

## 2022-07-09 DIAGNOSIS — N3001 Acute cystitis with hematuria: Secondary | ICD-10-CM | POA: Diagnosis not present

## 2022-07-09 DIAGNOSIS — J90 Pleural effusion, not elsewhere classified: Secondary | ICD-10-CM | POA: Diagnosis not present

## 2022-07-09 DIAGNOSIS — Z85118 Personal history of other malignant neoplasm of bronchus and lung: Secondary | ICD-10-CM

## 2022-07-09 LAB — URINALYSIS, W/ REFLEX TO CULTURE (INFECTION SUSPECTED)
Bilirubin Urine: NEGATIVE
Glucose, UA: NEGATIVE mg/dL
Ketones, ur: NEGATIVE mg/dL
Nitrite: NEGATIVE
Specific Gravity, Urine: 1.024 (ref 1.005–1.030)
pH: 5.5 (ref 5.0–8.0)

## 2022-07-09 LAB — CBC WITH DIFFERENTIAL/PLATELET
Abs Immature Granulocytes: 0.01 10*3/uL (ref 0.00–0.07)
Basophils Absolute: 0 10*3/uL (ref 0.0–0.1)
Basophils Relative: 1 %
Eosinophils Absolute: 0 10*3/uL (ref 0.0–0.5)
Eosinophils Relative: 1 %
HCT: 39.1 % (ref 36.0–46.0)
Hemoglobin: 13.2 g/dL (ref 12.0–15.0)
Immature Granulocytes: 0 %
Lymphocytes Relative: 29 %
Lymphs Abs: 1.1 10*3/uL (ref 0.7–4.0)
MCH: 33.5 pg (ref 26.0–34.0)
MCHC: 33.8 g/dL (ref 30.0–36.0)
MCV: 99.2 fL (ref 80.0–100.0)
Monocytes Absolute: 0.4 10*3/uL (ref 0.1–1.0)
Monocytes Relative: 9 %
Neutro Abs: 2.3 10*3/uL (ref 1.7–7.7)
Neutrophils Relative %: 60 %
Platelets: 419 10*3/uL — ABNORMAL HIGH (ref 150–400)
RBC: 3.94 MIL/uL (ref 3.87–5.11)
RDW: 14.6 % (ref 11.5–15.5)
WBC: 3.7 10*3/uL — ABNORMAL LOW (ref 4.0–10.5)
nRBC: 0 % (ref 0.0–0.2)

## 2022-07-09 LAB — COMPREHENSIVE METABOLIC PANEL
ALT: 6 U/L (ref 0–44)
AST: 13 U/L — ABNORMAL LOW (ref 15–41)
Albumin: 4.3 g/dL (ref 3.5–5.0)
Alkaline Phosphatase: 49 U/L (ref 38–126)
Anion gap: 9 (ref 5–15)
BUN: 10 mg/dL (ref 8–23)
CO2: 29 mmol/L (ref 22–32)
Calcium: 12.9 mg/dL — ABNORMAL HIGH (ref 8.9–10.3)
Chloride: 99 mmol/L (ref 98–111)
Creatinine, Ser: 1.24 mg/dL — ABNORMAL HIGH (ref 0.44–1.00)
GFR, Estimated: 48 mL/min — ABNORMAL LOW (ref >60)
Glucose, Bld: 97 mg/dL (ref 70–99)
Potassium: 4.4 mmol/L (ref 3.5–5.1)
Sodium: 137 mmol/L (ref 135–145)
Total Bilirubin: 0.6 mg/dL (ref 0.3–1.2)
Total Protein: 8.2 g/dL — ABNORMAL HIGH (ref 6.5–8.1)

## 2022-07-09 LAB — CBC
HCT: 38.6 % (ref 36.0–46.0)
Hemoglobin: 12.5 g/dL (ref 12.0–15.0)
MCH: 33.8 pg (ref 26.0–34.0)
MCHC: 32.4 g/dL (ref 30.0–36.0)
MCV: 104.3 fL — ABNORMAL HIGH (ref 80.0–100.0)
Platelets: 374 10*3/uL (ref 150–400)
RBC: 3.7 MIL/uL — ABNORMAL LOW (ref 3.87–5.11)
RDW: 14.7 % (ref 11.5–15.5)
WBC: 3.5 10*3/uL — ABNORMAL LOW (ref 4.0–10.5)
nRBC: 0 % (ref 0.0–0.2)

## 2022-07-09 LAB — CBG MONITORING, ED: Glucose-Capillary: 94 mg/dL (ref 70–99)

## 2022-07-09 LAB — HIV ANTIBODY (ROUTINE TESTING W REFLEX): HIV Screen 4th Generation wRfx: NONREACTIVE

## 2022-07-09 LAB — CREATININE, SERUM
Creatinine, Ser: 1.08 mg/dL — ABNORMAL HIGH (ref 0.44–1.00)
GFR, Estimated: 56 mL/min — ABNORMAL LOW (ref >60)

## 2022-07-09 LAB — VITAMIN D 25 HYDROXY (VIT D DEFICIENCY, FRACTURES): Vit D, 25-Hydroxy: 26.5 ng/mL — ABNORMAL LOW (ref 30–100)

## 2022-07-09 LAB — LIPASE, BLOOD: Lipase: 12 U/L (ref 11–51)

## 2022-07-09 MED ORDER — SODIUM CHLORIDE 0.9 % IV SOLN
1.0000 g | Freq: Once | INTRAVENOUS | Status: AC
  Administered 2022-07-09: 1 g via INTRAVENOUS
  Filled 2022-07-09: qty 10

## 2022-07-09 MED ORDER — ALUM & MAG HYDROXIDE-SIMETH 200-200-20 MG/5ML PO SUSP
30.0000 mL | Freq: Once | ORAL | Status: AC
  Administered 2022-07-09: 30 mL via ORAL
  Filled 2022-07-09: qty 30

## 2022-07-09 MED ORDER — FAMOTIDINE 20 MG PO TABS
20.0000 mg | ORAL_TABLET | Freq: Once | ORAL | Status: AC
  Administered 2022-07-09: 20 mg via ORAL
  Filled 2022-07-09: qty 1

## 2022-07-09 MED ORDER — POTASSIUM CHLORIDE IN NACL 20-0.9 MEQ/L-% IV SOLN
INTRAVENOUS | Status: DC
  Filled 2022-07-09 (×5): qty 1000

## 2022-07-09 MED ORDER — LACTATED RINGERS IV BOLUS
1000.0000 mL | Freq: Once | INTRAVENOUS | Status: AC
  Administered 2022-07-09: 1000 mL via INTRAVENOUS

## 2022-07-09 MED ORDER — ACETAMINOPHEN 325 MG PO TABS: 650.0000 mg | ORAL_TABLET | Freq: Four times a day (QID) | ORAL | Status: DC | PRN

## 2022-07-09 MED ORDER — TRAZODONE HCL 50 MG PO TABS
25.0000 mg | ORAL_TABLET | Freq: Every evening | ORAL | Status: DC | PRN
  Administered 2022-07-09 – 2022-07-10 (×2): 25 mg via ORAL
  Filled 2022-07-09 (×2): qty 1

## 2022-07-09 MED ORDER — ALBUTEROL SULFATE (2.5 MG/3ML) 0.083% IN NEBU: 2.5000 mg | INHALATION_SOLUTION | RESPIRATORY_TRACT | Status: DC | PRN

## 2022-07-09 MED ORDER — ACETAMINOPHEN 650 MG RE SUPP: 650.0000 mg | Freq: Four times a day (QID) | RECTAL | Status: DC | PRN

## 2022-07-09 MED ORDER — ONDANSETRON HCL 4 MG/2ML IJ SOLN: 4.0000 mg | Freq: Four times a day (QID) | INTRAMUSCULAR | Status: DC | PRN

## 2022-07-09 MED ORDER — POLYETHYLENE GLYCOL 3350 17 G PO PACK
17.0000 g | PACK | Freq: Every day | ORAL | Status: DC | PRN
  Administered 2022-07-09: 17 g via ORAL
  Filled 2022-07-09: qty 1

## 2022-07-09 MED ORDER — DOCUSATE SODIUM 100 MG PO CAPS
100.0000 mg | ORAL_CAPSULE | Freq: Two times a day (BID) | ORAL | Status: DC
  Administered 2022-07-09 – 2022-07-11 (×4): 100 mg via ORAL
  Filled 2022-07-09 (×4): qty 1

## 2022-07-09 MED ORDER — METOPROLOL TARTRATE 5 MG/5ML IV SOLN: 5.0000 mg | Freq: Four times a day (QID) | INTRAVENOUS | Status: DC | PRN

## 2022-07-09 MED ORDER — ENOXAPARIN SODIUM 40 MG/0.4ML IJ SOSY
40.0000 mg | PREFILLED_SYRINGE | INTRAMUSCULAR | Status: DC
  Administered 2022-07-09 – 2022-07-10 (×2): 40 mg via SUBCUTANEOUS
  Filled 2022-07-09 (×2): qty 0.4

## 2022-07-09 MED ORDER — ONDANSETRON HCL 4 MG PO TABS: 4.0000 mg | ORAL_TABLET | Freq: Four times a day (QID) | ORAL | Status: DC | PRN

## 2022-07-09 MED ORDER — PREGABALIN 75 MG PO CAPS
75.0000 mg | ORAL_CAPSULE | Freq: Two times a day (BID) | ORAL | Status: DC
  Administered 2022-07-09 – 2022-07-11 (×4): 75 mg via ORAL
  Filled 2022-07-09 (×4): qty 1

## 2022-07-09 MED ORDER — PANTOPRAZOLE SODIUM 40 MG IV SOLR
40.0000 mg | Freq: Every day | INTRAVENOUS | Status: DC
  Administered 2022-07-09 – 2022-07-11 (×3): 40 mg via INTRAVENOUS
  Filled 2022-07-09 (×3): qty 10

## 2022-07-09 MED ORDER — LEVOTHYROXINE SODIUM 150 MCG PO TABS
150.0000 ug | ORAL_TABLET | Freq: Every day | ORAL | Status: DC
  Administered 2022-07-10 – 2022-07-11 (×2): 150 ug via ORAL
  Filled 2022-07-09 (×2): qty 1

## 2022-07-09 MED ORDER — GABAPENTIN 300 MG PO CAPS: 300.0000 mg | ORAL_CAPSULE | Freq: Every day | ORAL | Status: DC

## 2022-07-09 MED ORDER — OXYCODONE HCL 5 MG PO TABS
5.0000 mg | ORAL_TABLET | ORAL | Status: DC | PRN
  Administered 2022-07-09: 5 mg via ORAL
  Filled 2022-07-09: qty 1

## 2022-07-09 MED ORDER — LIDOCAINE VISCOUS HCL 2 % MT SOLN
15.0000 mL | Freq: Once | OROMUCOSAL | Status: AC
  Administered 2022-07-09: 15 mL via ORAL
  Filled 2022-07-09: qty 15

## 2022-07-09 NOTE — ED Notes (Signed)
Called floor to give report to IP RN. RN was unable to take report at time called to give report. RN did return call however I was in room giving medications at the time she was able to call back.

## 2022-07-09 NOTE — H&P (Signed)
History and Physical  [name] [id number] DOB: 1956-03-13 DOA: 07/09/2022   PCP: Elsie Stain, MD   Patient coming from: Home   Chief Complaint: Abd pain   HPI: Janice Adams is a 67 y.o. female with medical history significant for lung cancer status post resection, GERD, hypertension be admitted to hospital due to symptomatic GERD after being out of her PPI for the last 4 days, as well as some concern for UTI and hypercalcemia.  History is provided by the patient as well as her son who is with her at [facility] this afternoon.  Apparently takes daily omeprazole, ran out about 4 days ago, has been having burning epigastric pain consistent with prior episodes of reflux.  No aggravating or alleviating factors, no associated nausea or vomiting.  Had a normal bowel movement the other day.  She is passing gas, she is very hungry.  To the ER primarily to refill her reflux medications.  However workup, showed evidence of possible urinalysis.  Also showed some hypercalcemia.  She was given IV fluids, empiric IV antibiotics and hospitalist was contacted for admission.  Currently she is resting comfortably with her son at the bedside, other than with some epigastric discomfort and she is hungry.  Review of Systems: Please see HPI for pertinent positives and negatives. A complete 10 system review of systems are otherwise negative.  Past Medical History:  Diagnosis Date   Back pain    Cancer ([place])    Lung Cancer   GERD (gastroesophageal reflux disease)    Gout    no current problems per patient 05/31/19   History of epigastric pain    comes and goes   Hypertension    no meds   Past Surgical History:  Procedure Laterality Date   INTERCOSTAL NERVE BLOCK Right 12/30/2019   Procedure: INTERCOSTAL NERVE BLOCK;  Surgeon: Melrose Nakayama, MD;  Location: [facility];  Service: Thoracic;  Laterality: Right;   LYMPH NODE DISSECTION Right 12/30/2019   Procedure: LYMPH NODE DISSECTION;   Surgeon: Melrose Nakayama, MD;  Location: [place];  Service: Thoracic;  Laterality: Right;   MULTIPLE TOOTH EXTRACTIONS     TONSILLECTOMY     uterine ablation     VIDEO BRONCHOSCOPY N/A 12/30/2019   Procedure: VIDEO BRONCHOSCOPY;  Surgeon: Melrose Nakayama, MD;  Location: [place];  Service: Thoracic;  Laterality: N/A;   VIDEO BRONCHOSCOPY WITH ENDOBRONCHIAL ULTRASOUND N/A 06/04/2019   Procedure: VIDEO BRONCHOSCOPY WITH ENDOBRONCHIAL ULTRASOUND;  Surgeon: Margaretha Seeds, MD;  Location: [facility];  Service: Thoracic;  Laterality: N/A;   WISDOM TOOTH EXTRACTION      Social History:  reports that she quit smoking about 3 years ago. Her smoking use included cigarettes. She has a 23.50 pack-year smoking history. She has never used smokeless tobacco. She reports that she does not drink alcohol and does not use drugs.   Allergies  Allergen Reactions   Ivp Dye [Iodinated Contrast Media] Other (See Comments)    Pt reports makes her hands and feet numb    Family History  Problem Relation Age of Onset   Breast cancer Mother    Cancer Mother    Seizures Father    Breast cancer Maternal Grandmother    Breast cancer Daughter    Cancer Daughter    Heart attack Brother      Prior to Admission medications   Medication Sig Start Date End Date Taking? Authorizing Provider  gabapentin (NEURONTIN) 300 MG capsule TAKE 1 CAPSULE(300  MG) BY MOUTH AT BEDTIME 10/11/21   Elsie Stain, MD  acetaminophen (TYLENOL) 500 MG tablet Take 2 tablets (1,000 mg total) by mouth every 6 (six) hours. 01/03/20   Elgie Collard, PA-C  levothyroxine (SYNTHROID) 150 MCG tablet TAKE 1 TABLET(150 MCG) BY MOUTH DAILY BEFORE AND BREAKFAST 01/31/22   Elsie Stain, MD  Multiple Vitamin (MULTIVITAMIN WITH MINERALS) TABS tablet Take 1 tablet by mouth daily.    [provider]  omeprazole (PRILOSEC) 20 MG capsule TAKE 1 CAPSULE(20 MG) BY MOUTH DAILY 07/01/22   Elsie Stain, MD  ondansetron (ZOFRAN-ODT) 4 MG  disintegrating tablet '4mg'$  ODT q4 hours prn nausea/vomit 06/26/22   Veryl Speak, MD  pregabalin (LYRICA) 75 MG capsule Take 1 capsule (75 mg total) by mouth 2 (two) times daily. 06/09/21   Elsie Stain, MD  Vitamin D, Ergocalciferol, (DRISDOL) 1.25 MG (50000 UNIT) CAPS capsule Take 1 capsule (50,000 Units total) by mouth every 7 (seven) days. 04/06/21   Elsie Stain, MD    Physical Exam: BP (!) 145/91 (BP Location: Left Arm)   Pulse 95   Temp 98.2 F (36.8 C) (Oral)   Resp 18   LMP  (LMP Unknown)   SpO2 95%   General:  Alert, oriented, calm, in no acute distress, pleasant and doesn't look very uncomfortable Eyes: EOMI, clear conjuctivae, white sclerea Neck: supple, no masses, trachea mildline  Cardiovascular: RRR, no murmurs or rubs, no peripheral edema  Respiratory: clear to auscultation bilaterally, no wheezes, no crackles  Abdomen: soft, very minimal pain to deep palpation in the epigastrium, nondistended, normal bowel tones heard  Skin: dry, no rashes  Musculoskeletal: no joint effusions, normal range of motion  Psychiatric: appropriate affect, normal speech  Neurologic: extraocular muscles intact, clear speech, moving all extremities with intact sensorium          Labs on Admission:  Basic Metabolic Panel: Recent Labs  Lab 07/09/22 1049  NA 137  K 4.4  CL 99  CO2 29  GLUCOSE 97  BUN 10  CREATININE 1.24*  CALCIUM 12.9*   Liver Function Tests: Recent Labs  Lab 07/09/22 1049  AST 13*  ALT 6  ALKPHOS 49  BILITOT 0.6  PROT 8.2*  ALBUMIN 4.3   Recent Labs  Lab 07/09/22 1049  LIPASE 12   No results for input(s): "AMMONIA" in the last 168 hours. CBC: Recent Labs  Lab 07/09/22 1049  WBC 3.7*  NEUTROABS 2.3  HGB 13.2  HCT 39.1  MCV 99.2  PLT 419*   Cardiac Enzymes: No results for input(s): "CKTOTAL", "CKMB", "CKMBINDEX", "TROPONINI" in the last 168 hours.  BNP (last 3 results) No results for input(s): "BNP" in the last 8760 hours.  ProBNP  (last 3 results) No results for input(s): "PROBNP" in the last 8760 hours.  CBG: Recent Labs  Lab 07/09/22 1231  GLUCAP 94    Radiological Exams on Admission: CT CHEST ABDOMEN PELVIS WO CONTRAST  Result Date: 07/09/2022 CLINICAL DATA:  Abdominal pain, lung cancer * Tracking Code: BO * EXAM: CT CHEST, ABDOMEN AND PELVIS WITHOUT CONTRAST TECHNIQUE: Multidetector CT imaging of the chest, abdomen and pelvis was performed following the standard protocol without IV contrast. RADIATION DOSE REDUCTION: This exam was performed according to the departmental dose-optimization program which includes automated exposure control, adjustment of the mA and/or kV according to patient size and/or use of iterative reconstruction technique. COMPARISON:  CT abdomen pelvis, 06/26/2022, CT chest, 02/02/2021 FINDINGS: CT CHEST FINDINGS Cardiovascular: Aortic atherosclerosis.  Normal heart size. No pericardial effusion. Mediastinum/Nodes: No enlarged mediastinal, hilar, or axillary lymph nodes. Thyroid gland, trachea, and esophagus demonstrate no significant findings. Lungs/Pleura: Status post right lower and middle lobectomy. Unchanged small, chronic loculated right pleural effusion. Emphysema. Musculoskeletal: No chest wall abnormality. No acute osseous findings. CT ABDOMEN PELVIS FINDINGS Hepatobiliary: No solid liver abnormality is seen. No gallstones, gallbladder wall thickening, or biliary dilatation. Pancreas: Unremarkable. No pancreatic ductal dilatation or surrounding inflammatory changes. Spleen: Normal in size without significant abnormality. Adrenals/Urinary Tract: Adrenal glands are unremarkable. Kidneys are normal, without renal calculi, solid lesion, or hydronephrosis. Bladder is unremarkable. Stomach/Bowel: Stomach is within normal limits. Appendix appears normal. No evidence of bowel wall thickening, distention, or inflammatory changes. Sigmoid diverticula. Vascular/Lymphatic: Aortic atherosclerosis. No enlarged  abdominal or pelvic lymph nodes. Reproductive: Calcified uterine fibroids. Other: No abdominal wall hernia or abnormality. No ascites. Musculoskeletal: No acute osseous findings. IMPRESSION: 1. No acute noncontrast CT findings of the chest, abdomen, or pelvis to explain abdominal pain. 2. Status post right lower and middle lobectomy. Unchanged small, chronic loculated right pleural effusion. 3. Emphysema. 4. Sigmoid diverticulosis without evidence of acute diverticulitis. 5. Uterine fibroids. Aortic Atherosclerosis (ICD10-I70.0) and Emphysema (ICD10-J43.9). Electronically Signed   By: Delanna Ahmadi M.D.   On: 07/09/2022 13:27    Assessment/Plan Principal Problem:   Abdominal pain -most likely due to being without her PPI.  CT scan as above without any evidence of organic cause of her pain. -Observation admission -Supportive care including pain and nausea medication as needed -Continue empiric IV PPI for now -Regular diet as tolerated Active Problems:   GERD (gastroesophageal reflux disease) -as above   UTI (urinary tract infection) -treat empirically with IV Rocephin, follow culture data   Hypothyroidism   Hypercalcemia -unclear etiology, checking PTH, definitely some concern given her prior history of malignancy which was treated in the past.  Will hydrate aggressively, recheck calcium levels in the morning.  DVT prophylaxis: Lovenox   Code Status: Full   Family Communication: Discussed with patient and her son at the bedside this afternoon.  Admission status: Observation   Time spent: 38 minutes  Shana Zavaleta Neva Seat MD [facility] Pager [phone]  If 7PM-7AM, please contact night-coverage [email or web address] Password [facility]  07/09/2022, 4:30 PM

## 2022-07-09 NOTE — ED Notes (Signed)
Report was given to [place].

## 2022-07-09 NOTE — ED Triage Notes (Signed)
Pt has reflux, has ran out of her omeprazole, her MD hasn't renewed prescription. She is having severe reflux symptoms.

## 2022-07-09 NOTE — ED Provider Notes (Signed)
[facility] Provider Note   CSN: [id number] Arrival date & time: 07/09/22  1001     History  Chief Complaint  Patient presents with   Gastroesophageal Reflux    Janice Adams is a 67 y.o. female.   Gastroesophageal Reflux     67 year old female who presents to the emergency department with burning epigastric pain after running out of her omeprazole 4 days ago.  The patient endorses burning epigastric pain consistent with prior episodes of reflux with no radiation of the pain.  No aggravating or alleviating factors.  She denies any nausea or vomiting.  She had last had a normal bowel movement 1 day ago.  She is passing gas.  No urinary symptoms.  No fevers or chills.  She is overall primarily here for refill of her reflux medications.  Home Medications Prior to Admission medications   Medication Sig Start Date End Date Taking? Authorizing Provider  gabapentin (NEURONTIN) 300 MG capsule TAKE 1 CAPSULE(300 MG) BY MOUTH AT BEDTIME 10/11/21   Elsie Stain, MD  acetaminophen (TYLENOL) 500 MG tablet Take 2 tablets (1,000 mg total) by mouth every 6 (six) hours. 01/03/20   Elgie Collard, PA-C  levothyroxine (SYNTHROID) 150 MCG tablet TAKE 1 TABLET(150 MCG) BY MOUTH DAILY BEFORE AND BREAKFAST 01/31/22   Elsie Stain, MD  Multiple Vitamin (MULTIVITAMIN WITH MINERALS) TABS tablet Take 1 tablet by mouth daily.    [provider]  omeprazole (PRILOSEC) 20 MG capsule TAKE 1 CAPSULE(20 MG) BY MOUTH DAILY 07/01/22   Elsie Stain, MD  ondansetron (ZOFRAN-ODT) 4 MG disintegrating tablet '4mg'$  ODT q4 hours prn nausea/vomit 06/26/22   Veryl Speak, MD  pregabalin (LYRICA) 75 MG capsule Take 1 capsule (75 mg total) by mouth 2 (two) times daily. 06/09/21   Elsie Stain, MD  Vitamin D, Ergocalciferol, (DRISDOL) 1.25 MG (50000 UNIT) CAPS capsule Take 1 capsule (50,000 Units total) by mouth every 7 (seven) days. 04/06/21   Elsie Stain,  MD      Allergies    Ivp dye [iodinated contrast media]    Review of Systems   Review of Systems  All other systems reviewed and are negative.   Physical Exam Updated Vital Signs BP 124/87   Pulse 94   Temp 98.2 F (36.8 C) (Oral)   Resp 18   LMP  (LMP Unknown)   SpO2 96%  Physical Exam Vitals and nursing note reviewed.  Constitutional:      General: She is not in acute distress.    Appearance: She is well-developed.     Comments: GCS 14, ABC intact  HENT:     Head: Normocephalic and atraumatic.  Eyes:     Conjunctiva/sclera: Conjunctivae normal.  Cardiovascular:     Rate and Rhythm: Normal rate and regular rhythm.     Heart sounds: No murmur heard. Pulmonary:     Effort: Pulmonary effort is normal. No respiratory distress.     Breath sounds: Normal breath sounds.  Abdominal:     Palpations: Abdomen is soft.     Tenderness: There is abdominal tenderness in the epigastric area. There is no guarding or rebound.  Musculoskeletal:        General: No swelling.     Cervical back: Neck supple.  Skin:    General: Skin is warm and dry.     Capillary Refill: Capillary refill takes less than 2 seconds.  Neurological:     General: No focal deficit  present.     Mental Status: She is alert and oriented to person, place, and time. Mental status is at baseline.     Comments: GCS 14, mildly disoriented and confused according to family bedside  Psychiatric:        Mood and Affect: Mood normal.     ED Results / Procedures / Treatments   Labs (all labs ordered are listed, but only abnormal results are displayed) Labs Reviewed  CBC WITH DIFFERENTIAL/PLATELET - Abnormal; Notable for the following components:      Result Value   WBC 3.7 (*)    Platelets 419 (*)    All other components within normal limits  COMPREHENSIVE METABOLIC PANEL - Abnormal; Notable for the following components:   Creatinine, Ser 1.24 (*)    Calcium 12.9 (*)    Total Protein 8.2 (*)    AST 13 (*)     GFR, Estimated 48 (*)    All other components within normal limits  URINALYSIS, W/ REFLEX TO CULTURE (INFECTION SUSPECTED) - Abnormal; Notable for the following components:   Hgb urine dipstick TRACE (*)    Protein, ur TRACE (*)    Leukocytes,Ua SMALL (*)    Bacteria, UA MANY (*)    All other components within normal limits  LIPASE, BLOOD  PTH, INTACT AND CALCIUM  VITAMIN D 25 HYDROXY (VIT D DEFICIENCY, FRACTURES)  CBG MONITORING, ED    EKG None  Radiology CT CHEST ABDOMEN PELVIS WO CONTRAST  Result Date: 07/09/2022 CLINICAL DATA:  Abdominal pain, lung cancer * Tracking Code: BO * EXAM: CT CHEST, ABDOMEN AND PELVIS WITHOUT CONTRAST TECHNIQUE: Multidetector CT imaging of the chest, abdomen and pelvis was performed following the standard protocol without IV contrast. RADIATION DOSE REDUCTION: This exam was performed according to the departmental dose-optimization program which includes automated exposure control, adjustment of the mA and/or kV according to patient size and/or use of iterative reconstruction technique. COMPARISON:  CT abdomen pelvis, 06/26/2022, CT chest, 02/02/2021 FINDINGS: CT CHEST FINDINGS Cardiovascular: Aortic atherosclerosis. Normal heart size. No pericardial effusion. Mediastinum/Nodes: No enlarged mediastinal, hilar, or axillary lymph nodes. Thyroid gland, trachea, and esophagus demonstrate no significant findings. Lungs/Pleura: Status post right lower and middle lobectomy. Unchanged small, chronic loculated right pleural effusion. Emphysema. Musculoskeletal: No chest wall abnormality. No acute osseous findings. CT ABDOMEN PELVIS FINDINGS Hepatobiliary: No solid liver abnormality is seen. No gallstones, gallbladder wall thickening, or biliary dilatation. Pancreas: Unremarkable. No pancreatic ductal dilatation or surrounding inflammatory changes. Spleen: Normal in size without significant abnormality. Adrenals/Urinary Tract: Adrenal glands are unremarkable. Kidneys are  normal, without renal calculi, solid lesion, or hydronephrosis. Bladder is unremarkable. Stomach/Bowel: Stomach is within normal limits. Appendix appears normal. No evidence of bowel wall thickening, distention, or inflammatory changes. Sigmoid diverticula. Vascular/Lymphatic: Aortic atherosclerosis. No enlarged abdominal or pelvic lymph nodes. Reproductive: Calcified uterine fibroids. Other: No abdominal wall hernia or abnormality. No ascites. Musculoskeletal: No acute osseous findings. IMPRESSION: 1. No acute noncontrast CT findings of the chest, abdomen, or pelvis to explain abdominal pain. 2. Status post right lower and middle lobectomy. Unchanged small, chronic loculated right pleural effusion. 3. Emphysema. 4. Sigmoid diverticulosis without evidence of acute diverticulitis. 5. Uterine fibroids. Aortic Atherosclerosis (ICD10-I70.0) and Emphysema (ICD10-J43.9). Electronically Signed   By: Delanna Ahmadi M.D.   On: 07/09/2022 13:27    Procedures Procedures    Medications Ordered in ED Medications  cefTRIAXone (ROCEPHIN) 1 g in sodium chloride 0.9 % 100 mL IVPB (has no administration in time range)  alum &  mag hydroxide-simeth (MAALOX/MYLANTA) 200-200-20 MG/5ML suspension 30 mL (30 mLs Oral Given 07/09/22 1038)    And  lidocaine (XYLOCAINE) 2 % viscous mouth solution 15 mL (15 mLs Oral Given 07/09/22 1038)  famotidine (PEPCID) tablet 20 mg (20 mg Oral Given 07/09/22 1037)  lactated ringers bolus 1,000 mL (1,000 mLs Intravenous New Bag/Given 07/09/22 1238)    ED Course/ Medical Decision Making/ A&P Clinical Course as of 07/09/22 1405  Sat Jul 09, 2022  1349 Calcium(!): 12.9 [JL]    Clinical Course User Index [JL] Regan Lemming, MD                             Medical Decision Making Amount and/or Complexity of Data Reviewed Labs: ordered. Decision-making details documented in ED Course. Radiology: ordered.  Risk OTC drugs. Prescription drug management. Decision regarding  hospitalization.    67 year old female who presents to the emergency department with burning epigastric pain after running out of her omeprazole 4 days ago.  The patient endorses burning epigastric pain consistent with prior episodes of reflux with no radiation of the pain.  No aggravating or alleviating factors.  She denies any nausea or vomiting.  She had last had a normal bowel movement 1 day ago.  She is passing gas.  No urinary symptoms.  No fevers or chills.  She is overall primarily here for refill of her reflux medications.  On arrival, the patient was afebrile, temperature 98.2, borderline tachycardic heart rate 100, not tachypneic RR 20, BP 127/96, saturating 98% on room air.  Sinus rhythm noted on cardiac telemetry.  Physical exam significant for generalized abdominal tenderness to palpation, no rebound or guarding.    Differential diagnosis includes GERD, small bowel obstruction, UTI, pyelonephritis, diverticulosis/diverticulitis, appendicitis, recurrent malignancy, electrolyte abnormality, pancreatitis, cholelithiasis/cholecystitis.  Patient laboratory evaluation significant for CBC with a leukopenia to 3.7, thrombocytosis to 419, no anemia with a hemoglobin of 13.2, lipase was normal at 12, CBG within 94, CMP revealed hypercalcemia to 12.9, elevated serum creatinine but roughly at the patient's baseline at 1.24, no other significant electrolyte abnormality, normal renal and liver function.  The patient was found to have a urinary tract infection as well.  She does endorse increased frequency.  Patient was administered Maalox and lidocaine and oral Pepcid due to complaint of GERD.  IV access was obtained and the patient was administered IV fluid bolus and treated with IV Rocephin.  A CT chest abdomen pelvis was performed which revealed no acute abnormalities: IMPRESSION:  1. No acute noncontrast CT findings of the chest, abdomen, or pelvis  to explain abdominal pain.  2. Status post  right lower and middle lobectomy. Unchanged small,  chronic loculated right pleural effusion.  3. Emphysema.  4. Sigmoid diverticulosis without evidence of acute diverticulitis.  5. Uterine fibroids.    Aortic Atherosclerosis (ICD10-I70.0) and Emphysema (ICD10-J43.9).    Given the patient's hypercalcemia, UTI with mild confusion, hospitalist medicine was consulted for admission for further rehydration and monitoring.  Patient was subsequently admitted in stable condition.   Final Clinical Impression(s) / ED Diagnoses Final diagnoses:  Hypercalcemia  Acute cystitis with hematuria  Confusion  Generalized abdominal pain    Rx / DC Orders ED Discharge Orders     None         Regan Lemming, MD 07/09/22 1405

## 2022-07-10 DIAGNOSIS — Z91148 Patient's other noncompliance with medication regimen for other reason: Secondary | ICD-10-CM | POA: Diagnosis not present

## 2022-07-10 DIAGNOSIS — R109 Unspecified abdominal pain: Secondary | ICD-10-CM | POA: Diagnosis not present

## 2022-07-10 DIAGNOSIS — E860 Dehydration: Secondary | ICD-10-CM | POA: Diagnosis present

## 2022-07-10 DIAGNOSIS — R8271 Bacteriuria: Secondary | ICD-10-CM | POA: Diagnosis present

## 2022-07-10 DIAGNOSIS — I10 Essential (primary) hypertension: Secondary | ICD-10-CM | POA: Diagnosis present

## 2022-07-10 DIAGNOSIS — Z79899 Other long term (current) drug therapy: Secondary | ICD-10-CM | POA: Diagnosis not present

## 2022-07-10 DIAGNOSIS — K219 Gastro-esophageal reflux disease without esophagitis: Secondary | ICD-10-CM | POA: Diagnosis not present

## 2022-07-10 DIAGNOSIS — Z8249 Family history of ischemic heart disease and other diseases of the circulatory system: Secondary | ICD-10-CM | POA: Diagnosis not present

## 2022-07-10 DIAGNOSIS — Z803 Family history of malignant neoplasm of breast: Secondary | ICD-10-CM | POA: Diagnosis not present

## 2022-07-10 DIAGNOSIS — Z87891 Personal history of nicotine dependence: Secondary | ICD-10-CM | POA: Diagnosis not present

## 2022-07-10 DIAGNOSIS — E869 Volume depletion, unspecified: Secondary | ICD-10-CM | POA: Diagnosis present

## 2022-07-10 DIAGNOSIS — Z91041 Radiographic dye allergy status: Secondary | ICD-10-CM | POA: Diagnosis not present

## 2022-07-10 DIAGNOSIS — T381X6A Underdosing of thyroid hormones and substitutes, initial encounter: Secondary | ICD-10-CM | POA: Diagnosis present

## 2022-07-10 DIAGNOSIS — Z79890 Hormone replacement therapy (postmenopausal): Secondary | ICD-10-CM | POA: Diagnosis not present

## 2022-07-10 DIAGNOSIS — E039 Hypothyroidism, unspecified: Secondary | ICD-10-CM | POA: Diagnosis present

## 2022-07-10 DIAGNOSIS — Z85118 Personal history of other malignant neoplasm of bronchus and lung: Secondary | ICD-10-CM | POA: Diagnosis not present

## 2022-07-10 LAB — CBC
HCT: 36.8 % (ref 36.0–46.0)
Hemoglobin: 12.1 g/dL (ref 12.0–15.0)
MCH: 33.7 pg (ref 26.0–34.0)
MCHC: 32.9 g/dL (ref 30.0–36.0)
MCV: 102.5 fL — ABNORMAL HIGH (ref 80.0–100.0)
Platelets: 437 10*3/uL — ABNORMAL HIGH (ref 150–400)
RBC: 3.59 MIL/uL — ABNORMAL LOW (ref 3.87–5.11)
RDW: 14.6 % (ref 11.5–15.5)
WBC: 3.2 10*3/uL — ABNORMAL LOW (ref 4.0–10.5)
nRBC: 0 % (ref 0.0–0.2)

## 2022-07-10 LAB — COMPREHENSIVE METABOLIC PANEL
ALT: 7 U/L (ref 0–44)
AST: 15 U/L (ref 15–41)
Albumin: 3.4 g/dL — ABNORMAL LOW (ref 3.5–5.0)
Alkaline Phosphatase: 47 U/L (ref 38–126)
Anion gap: 7 (ref 5–15)
BUN: 10 mg/dL (ref 8–23)
CO2: 27 mmol/L (ref 22–32)
Calcium: 9.4 mg/dL (ref 8.9–10.3)
Chloride: 102 mmol/L (ref 98–111)
Creatinine, Ser: 1.16 mg/dL — ABNORMAL HIGH (ref 0.44–1.00)
GFR, Estimated: 52 mL/min — ABNORMAL LOW (ref >60)
Glucose, Bld: 82 mg/dL (ref 70–99)
Potassium: 4 mmol/L (ref 3.5–5.1)
Sodium: 136 mmol/L (ref 135–145)
Total Bilirubin: 0.8 mg/dL (ref 0.3–1.2)
Total Protein: 7.3 g/dL (ref 6.5–8.1)

## 2022-07-10 NOTE — Progress Notes (Signed)
PROGRESS NOTE    Janice Adams  [id number] DOB: March 20, 1956 DOA: 07/09/2022 PCP: Elsie Stain, MD  Outpatient Specialists:     Brief Narrative:  Patient is a 67 year old African-American female with past medical history significant for lung cancer s/p resection, GERD and hypertension.  Patient was admitted with 4-day history of abdominal pain.  Apparently, patient had been off her PPIs.  Calcium was also elevated.  Patient was volume depleted.  Urinalysis revealed many bacteria.  07/10/2022: Patient seen.  Patient is on IV Protonix.  Epigastric pain has resolved.  Patient remains volume depleted.  Patient will need to continue IV fluids for today.  Urine will be sent for culture.  Calcium has improved with hydration.  Likely discharge home in the next 24 to 48 hours.   Assessment & Plan:   Principal Problem:   Abdominal pain Active Problems:   GERD (gastroesophageal reflux disease)   UTI (urinary tract infection)   Hypothyroidism   Hypercalcemia   Abdominal pain: -Abdominal pain is mainly epigastric. -History of GERD.   -Patient was off PPI for few days. -Currently on IV Protonix.   -Epigastric pain has resolved.   -Continue PPI  -Follow-up with GI on discharge  -Patient may need EGD.    GERD (gastroesophageal reflux disease): -Currently on IV PPI. -Follow-up with GI on discharge.    Bacteriuria:  -Urine culture. -Further management.  Depend on above.    Volume depletion: -Continue IV fluids for now.  Hypothyroidism: -Continue Synthroid. -Check TSH.  Hypercalcemia: -Probably related to volume depletion. -Calcium is back to normal. -Continue to monitor closely. -Follow-up PTH.     DVT prophylaxis: Subcutaneous Lovenox. Code Status: Full code. Family Communication:  Disposition Plan: Home eventually.   Consultants:  None.  Procedures:  None.  Antimicrobials:  None for now.   Subjective: No new complaints. Abdominal (epigastric) pain has  resolved. No fever or chills.  Objective: Vitals:   07/09/22 2021 07/10/22 0108 07/10/22 0334 07/10/22 1445  BP: (!) 146/93 (!) 127/93 (!) 124/92 (!) 129/91  Pulse: 88 87 90 94  Resp: '18 16 16 18  '$ Temp: 98.8 F (37.1 C) 98 F (36.7 C) 97.8 F (36.6 C) 97.6 F (36.4 C)  TempSrc: Oral Oral Oral Oral  SpO2: 94% 97% 90% 98%    Intake/Output Summary (Last 24 hours) at 07/10/2022 1620 Last data filed at 07/10/2022 [id number] Gross per 24 hour  Intake 985.7 ml  Output 2 ml  Net 983.7 ml   There were no vitals filed for this visit.  Examination:  General exam: Appears calm and comfortable  Respiratory system: Clear to auscultation.  Cardiovascular system: S1 & S2 heard Gastrointestinal system: Abdomen is soft and nontender. Central nervous system: Alert and oriented.  Extremities: No leg edema.   Data Reviewed: I have personally reviewed following labs and imaging studies  CBC: Recent Labs  Lab 07/09/22 1049 07/09/22 1814 07/10/22 0533  WBC 3.7* 3.5* 3.2*  NEUTROABS 2.3  --   --   HGB 13.2 12.5 12.1  HCT 39.1 38.6 36.8  MCV 99.2 104.3* 102.5*  PLT 419* 374 [id number]*   Basic Metabolic Panel: Recent Labs  Lab 07/09/22 1049 07/09/22 1814 07/10/22 0533  NA 137  --  136  K 4.4  --  4.0  CL 99  --  102  CO2 29  --  27  GLUCOSE 97  --  82  BUN 10  --  10  CREATININE 1.24* 1.08* 1.16*  CALCIUM 12.9*  --  9.4   GFR: CrCl cannot be calculated (Unknown ideal weight.). Liver Function Tests: Recent Labs  Lab 07/09/22 1049 07/10/22 0533  AST 13* 15  ALT 6 7  ALKPHOS 49 47  BILITOT 0.6 0.8  PROT 8.2* 7.3  ALBUMIN 4.3 3.4*   Recent Labs  Lab 07/09/22 1049  LIPASE 12   No results for input(s): "AMMONIA" in the last 168 hours. Coagulation Profile: No results for input(s): "INR", "PROTIME" in the last 168 hours. Cardiac Enzymes: No results for input(s): "CKTOTAL", "CKMB", "CKMBINDEX", "TROPONINI" in the last 168 hours. BNP (last 3 results) No results for input(s):  "PROBNP" in the last 8760 hours. HbA1C: No results for input(s): "HGBA1C" in the last 72 hours. CBG: Recent Labs  Lab 07/09/22 1231  GLUCAP 94   Lipid Profile: No results for input(s): "CHOL", "HDL", "LDLCALC", "TRIG", "CHOLHDL", "LDLDIRECT" in the last 72 hours. Thyroid Function Tests: No results for input(s): "TSH", "T4TOTAL", "FREET4", "T3FREE", "THYROIDAB" in the last 72 hours. Anemia Panel: No results for input(s): "VITAMINB12", "FOLATE", "FERRITIN", "TIBC", "IRON", "RETICCTPCT" in the last 72 hours. Urine analysis:    Component Value Date/Time   COLORURINE YELLOW 07/09/2022 1229   APPEARANCEUR CLEAR 07/09/2022 1229   LABSPEC 1.024 07/09/2022 1229   PHURINE 5.5 07/09/2022 1229   GLUCOSEU NEGATIVE 07/09/2022 1229   HGBUR TRACE (A) 07/09/2022 1229   BILIRUBINUR NEGATIVE 07/09/2022 1229   BILIRUBINUR negative 08/07/2019 1215   KETONESUR NEGATIVE 07/09/2022 1229   PROTEINUR TRACE (A) 07/09/2022 1229   UROBILINOGEN 0.2 08/07/2019 1215   NITRITE NEGATIVE 07/09/2022 1229   LEUKOCYTESUR SMALL (A) 07/09/2022 1229   Sepsis Labs: '@LABRCNTIP'$ (procalcitonin:4,lacticidven:4)  )No results found for this or any previous visit (from the past 240 hour(s)).       Radiology Studies: CT CHEST ABDOMEN PELVIS WO CONTRAST  Result Date: 07/09/2022 CLINICAL DATA:  Abdominal pain, lung cancer * Tracking Code: BO * EXAM: CT CHEST, ABDOMEN AND PELVIS WITHOUT CONTRAST TECHNIQUE: Multidetector CT imaging of the chest, abdomen and pelvis was performed following the standard protocol without IV contrast. RADIATION DOSE REDUCTION: This exam was performed according to the departmental dose-optimization program which includes automated exposure control, adjustment of the mA and/or kV according to patient size and/or use of iterative reconstruction technique. COMPARISON:  CT abdomen pelvis, 06/26/2022, CT chest, 02/02/2021 FINDINGS: CT CHEST FINDINGS Cardiovascular: Aortic atherosclerosis. Normal heart  size. No pericardial effusion. Mediastinum/Nodes: No enlarged mediastinal, hilar, or axillary lymph nodes. Thyroid gland, trachea, and esophagus demonstrate no significant findings. Lungs/Pleura: Status post right lower and middle lobectomy. Unchanged small, chronic loculated right pleural effusion. Emphysema. Musculoskeletal: No chest wall abnormality. No acute osseous findings. CT ABDOMEN PELVIS FINDINGS Hepatobiliary: No solid liver abnormality is seen. No gallstones, gallbladder wall thickening, or biliary dilatation. Pancreas: Unremarkable. No pancreatic ductal dilatation or surrounding inflammatory changes. Spleen: Normal in size without significant abnormality. Adrenals/Urinary Tract: Adrenal glands are unremarkable. Kidneys are normal, without renal calculi, solid lesion, or hydronephrosis. Bladder is unremarkable. Stomach/Bowel: Stomach is within normal limits. Appendix appears normal. No evidence of bowel wall thickening, distention, or inflammatory changes. Sigmoid diverticula. Vascular/Lymphatic: Aortic atherosclerosis. No enlarged abdominal or pelvic lymph nodes. Reproductive: Calcified uterine fibroids. Other: No abdominal wall hernia or abnormality. No ascites. Musculoskeletal: No acute osseous findings. IMPRESSION: 1. No acute noncontrast CT findings of the chest, abdomen, or pelvis to explain abdominal pain. 2. Status post right lower and middle lobectomy. Unchanged small, chronic loculated right pleural effusion. 3. Emphysema. 4. Sigmoid diverticulosis without evidence of acute diverticulitis. 5. Uterine  fibroids. Aortic Atherosclerosis (ICD10-I70.0) and Emphysema (ICD10-J43.9). Electronically Signed   By: Delanna Ahmadi M.D.   On: 07/09/2022 13:27        Scheduled Meds:  docusate sodium  100 mg Oral BID   enoxaparin (LOVENOX) injection  40 mg Subcutaneous Q24H   levothyroxine  150 mcg Oral Q0600   pantoprazole (PROTONIX) IV  40 mg Intravenous Daily   pregabalin  75 mg Oral BID    Continuous Infusions:  0.9 % NaCl with KCl 20 mEq / L 100 mL/hr at 07/10/22 0425     LOS: 0 days    Time spent: 35 minutes.    Dana Allan, MD  [facility] Pager #: [phone] 7PM-7AM contact night coverage as above

## 2022-07-10 NOTE — Progress Notes (Cosign Needed)
  Transition of Care [facility]) Screening Note   Patient Details  Name: Janice Adams Date of Birth: 1955-06-27   Transition of Care [facility]) CM/SW Contact:    Henrietta Dine, RN Phone Number: 07/10/2022, 3:28 PM    Transition of Care Department [facility]) has reviewed patient and no TOC needs have been identified at this time. We will continue to monitor patient advancement through interdisciplinary progression rounds. If new patient transition needs arise, please place a TOC consult.

## 2022-07-11 DIAGNOSIS — K219 Gastro-esophageal reflux disease without esophagitis: Secondary | ICD-10-CM | POA: Diagnosis not present

## 2022-07-11 LAB — CBC WITH DIFFERENTIAL/PLATELET
Abs Immature Granulocytes: 0.01 10*3/uL (ref 0.00–0.07)
Basophils Absolute: 0 10*3/uL (ref 0.0–0.1)
Basophils Relative: 1 %
Eosinophils Absolute: 0 10*3/uL (ref 0.0–0.5)
Eosinophils Relative: 1 %
HCT: 37.6 % (ref 36.0–46.0)
Hemoglobin: 12.5 g/dL (ref 12.0–15.0)
Immature Granulocytes: 0 %
Lymphocytes Relative: 39 %
Lymphs Abs: 1.1 10*3/uL (ref 0.7–4.0)
MCH: 34.3 pg — ABNORMAL HIGH (ref 26.0–34.0)
MCHC: 33.2 g/dL (ref 30.0–36.0)
MCV: 103.3 fL — ABNORMAL HIGH (ref 80.0–100.0)
Monocytes Absolute: 0.3 10*3/uL (ref 0.1–1.0)
Monocytes Relative: 9 %
Neutro Abs: 1.4 10*3/uL — ABNORMAL LOW (ref 1.7–7.7)
Neutrophils Relative %: 50 %
Platelets: 388 10*3/uL (ref 150–400)
RBC: 3.64 MIL/uL — ABNORMAL LOW (ref 3.87–5.11)
RDW: 14.9 % (ref 11.5–15.5)
WBC: 2.8 10*3/uL — ABNORMAL LOW (ref 4.0–10.5)
nRBC: 0 % (ref 0.0–0.2)

## 2022-07-11 LAB — TSH: TSH: 225.524 u[IU]/mL — ABNORMAL HIGH (ref 0.350–4.500)

## 2022-07-11 LAB — RENAL FUNCTION PANEL
Albumin: 3.6 g/dL (ref 3.5–5.0)
Anion gap: 8 (ref 5–15)
BUN: 7 mg/dL — ABNORMAL LOW (ref 8–23)
CO2: 26 mmol/L (ref 22–32)
Calcium: 9.1 mg/dL (ref 8.9–10.3)
Chloride: 103 mmol/L (ref 98–111)
Creatinine, Ser: 1.1 mg/dL — ABNORMAL HIGH (ref 0.44–1.00)
GFR, Estimated: 55 mL/min — ABNORMAL LOW (ref >60)
Glucose, Bld: 86 mg/dL (ref 70–99)
Phosphorus: 3 mg/dL (ref 2.5–4.6)
Potassium: 4.3 mmol/L (ref 3.5–5.1)
Sodium: 137 mmol/L (ref 135–145)

## 2022-07-11 LAB — URINE CULTURE: Culture: NO GROWTH

## 2022-07-11 MED ORDER — OMEPRAZOLE 20 MG PO CPDR: DELAYED_RELEASE_CAPSULE | ORAL | 0 refills | Status: DC

## 2022-07-11 NOTE — Plan of Care (Signed)
Verbal education provided

## 2022-07-11 NOTE — Discharge Summary (Addendum)
Discharge Summary  Janice Adams [id number] DOB: [birth year]  PCP: Elsie Stain, MD  Admit date: 07/09/2022 Discharge date: 07/11/2022  Time spent: 35 minutes.  Recommendations for Outpatient Follow-up:  Follow-up with your primary care provider. Obtain GI referral from your PCP. Take your medications as prescribed.  Discharge Diagnoses:  Active Hospital Problems   Diagnosis Date Noted   Abdominal pain 07/09/2022   Volume depletion 07/10/2022   Hypercalcemia 07/09/2022   Hypothyroidism 04/06/2021   UTI (urinary tract infection) 08/19/2019   GERD (gastroesophageal reflux disease) 11/08/2012    Resolved Hospital Problems  No resolved problems to display.    Discharge Condition: Stable  Diet recommendation: Resume previous diet.  Vitals:   07/10/22 2030 07/11/22 0504  BP: 122/86 122/87  Pulse: 91 94  Resp: 18 18  Temp: 97.9 F (36.6 C) 97.8 F (36.6 C)  SpO2: 91% 95%    History of present illness:  Patient is a 67 year old African-American female with past medical history significant for lung cancer s/p resection, GERD and hypertension.  Patient was admitted with 4-day history of abdominal pain.  Volume depleted on presentation.  Received IV fluid and IV PPI with improvement of her symptomatology.  Urinalysis revealed many bacteria, asymptomatic.  Urine culture in process.  Her abdominal pain resolved.  She was able to tolerate a diet.  No recurrent abdominal pain, no nausea or vomiting.  07/11/2022: The patient was seen and examined at bedside.  There were no acute events overnight.  She ate her breakfast without any difficulty.  She has no new complaints and is eager to go home.  Advised to take her medications as prescribed.  The patient was receptive.   Hospital Course:  Principal Problem:   Abdominal pain Active Problems:   GERD (gastroesophageal reflux disease)   UTI (urinary tract infection)   Hypothyroidism   Hypercalcemia   Volume  depletion  Resolved, abdominal pain: -History of GERD.   -Patient was off her home PPI for few days, resume. Received IV Protonix.   -Epigastric pain has resolved.   -Continue home PPI  Follow-up with GI primary care provider, obtain GI referral from your PCP.   GERD (gastroesophageal reflux disease): Received IV PPI. -Follow-up with GI on discharge.  Obtain GI referral from your PCP. Resume home omeprazole, 20 mg daily.   Asymptomatic bacteriuria:  -Urine culture, in process.    Resolved, volume depletion: Received IV fluids Euvolemic on exam   Hypothyroidism: Continue home Synthroid. TSH 225, made the patient aware, she stated she was not taking it regularly.  Stated she had plenty of the medication and did not need a refill. Repeat TSH and obtain free T4 level outpatient in 4-6 weeks. Advised on the importance of medication compliance.  The patient was receptive. Take your medications as prescribed Follow-up with your primary care provider  Medication noncompliance Noncompliant with home thyroid medication Ran out of home omeprazole, refilled, 20 mg daily x 90 days. Advised on the importance of medication compliance, the patient was receptive.  Resolved, hypercalcemia: Suspect related to dehydration.    DVT prophylaxis: Subcutaneous Lovenox daily. Code Status: Full code. Family Communication:  Disposition Plan: Home eventually.     Consultants:  None.   Procedures:  None.   Antimicrobials:  None for now.    Discharge Exam: BP 122/87 (BP Location: Left Arm)   Pulse 94   Temp 97.8 F (36.6 C) (Oral)   Resp 18   LMP  (LMP Unknown)   SpO2 95%  General: 67 y.o. year-old female well developed well nourished in no acute distress.  Alert and oriented x3. Cardiovascular: Regular rate and rhythm with no rubs or gallops.  No thyromegaly or JVD noted.   Respiratory: Clear to auscultation with no wheezes or rales. Good inspiratory effort. Abdomen: Soft  nontender nondistended with normal bowel sounds x4 quadrants. Musculoskeletal: No lower extremity edema. 2/4 pulses in all 4 extremities. Skin: No ulcerative lesions noted or rashes, Psychiatry: Mood is appropriate for condition and setting  Discharge Instructions You were cared for by a hospitalist during your hospital stay. If you have any questions about your discharge medications or the care you received while you were in the hospital after you are discharged, you can call the unit and asked to speak with the hospitalist on call if the hospitalist that took care of you is not available. Once you are discharged, your primary care physician will handle any further medical issues. Please note that NO REFILLS for any discharge medications will be authorized once you are discharged, as it is imperative that you return to your primary care physician (or establish a relationship with a primary care physician if you do not have one) for your aftercare needs so that they can reassess your need for medications and monitor your lab values.   Allergies as of 07/11/2022       Reactions   Ivp Dye [iodinated Contrast Media] Other (See Comments)   Pt reports makes her hands and feet numb        Medication List     TAKE these medications    acetaminophen 500 MG tablet Commonly known as: TYLENOL Take 2 tablets (1,000 mg total) by mouth every 6 (six) hours.   levothyroxine 150 MCG tablet Commonly known as: SYNTHROID TAKE 1 TABLET(150 MCG) BY MOUTH DAILY BEFORE AND BREAKFAST What changed: See the new instructions.   multivitamin with minerals Tabs tablet Take 1 tablet by mouth daily.   omeprazole 20 MG capsule Commonly known as: PRILOSEC TAKE 1 CAPSULE(20 MG) BY MOUTH DAILY   ondansetron 4 MG disintegrating tablet Commonly known as: ZOFRAN-ODT '4mg'$  ODT q4 hours prn nausea/vomit   pregabalin 75 MG capsule Commonly known as: Lyrica Take 1 capsule (75 mg total) by mouth 2 (two) times daily.    Vitamin D (Ergocalciferol) 1.25 MG (50000 UNIT) Caps capsule Commonly known as: DRISDOL Take 1 capsule (50,000 Units total) by mouth every 7 (seven) days.       Allergies  Allergen Reactions   Ivp Dye [Iodinated Contrast Media] Other (See Comments)    Pt reports makes her hands and feet numb    Follow-up Information     Elsie Stain, MD. Call today.   Specialty: Pulmonary Disease Why: Please call for a posthospital follow-up appointment. Contact information: [address] [place] [address] [phone]                  The results of significant diagnostics from this hospitalization (including imaging, microbiology, ancillary and laboratory) are listed below for reference.    Significant Diagnostic Studies: CT CHEST ABDOMEN PELVIS WO CONTRAST  Result Date: 07/09/2022 CLINICAL DATA:  Abdominal pain, lung cancer * Tracking Code: BO * EXAM: CT CHEST, ABDOMEN AND PELVIS WITHOUT CONTRAST TECHNIQUE: Multidetector CT imaging of the chest, abdomen and pelvis was performed following the standard protocol without IV contrast. RADIATION DOSE REDUCTION: This exam was performed according to the departmental dose-optimization program which includes automated exposure control, adjustment of  the mA and/or kV according to patient size and/or use of iterative reconstruction technique. COMPARISON:  CT abdomen pelvis, 06/26/2022, CT chest, 02/02/2021 FINDINGS: CT CHEST FINDINGS Cardiovascular: Aortic atherosclerosis. Normal heart size. No pericardial effusion. Mediastinum/Nodes: No enlarged mediastinal, hilar, or axillary lymph nodes. Thyroid gland, trachea, and esophagus demonstrate no significant findings. Lungs/Pleura: Status post right lower and middle lobectomy. Unchanged small, chronic loculated right pleural effusion. Emphysema. Musculoskeletal: No chest wall abnormality. No acute osseous findings. CT ABDOMEN PELVIS FINDINGS Hepatobiliary: No solid liver abnormality is  seen. No gallstones, gallbladder wall thickening, or biliary dilatation. Pancreas: Unremarkable. No pancreatic ductal dilatation or surrounding inflammatory changes. Spleen: Normal in size without significant abnormality. Adrenals/Urinary Tract: Adrenal glands are unremarkable. Kidneys are normal, without renal calculi, solid lesion, or hydronephrosis. Bladder is unremarkable. Stomach/Bowel: Stomach is within normal limits. Appendix appears normal. No evidence of bowel wall thickening, distention, or inflammatory changes. Sigmoid diverticula. Vascular/Lymphatic: Aortic atherosclerosis. No enlarged abdominal or pelvic lymph nodes. Reproductive: Calcified uterine fibroids. Other: No abdominal wall hernia or abnormality. No ascites. Musculoskeletal: No acute osseous findings. IMPRESSION: 1. No acute noncontrast CT findings of the chest, abdomen, or pelvis to explain abdominal pain. 2. Status post right lower and middle lobectomy. Unchanged small, chronic loculated right pleural effusion. 3. Emphysema. 4. Sigmoid diverticulosis without evidence of acute diverticulitis. 5. Uterine fibroids. Aortic Atherosclerosis (ICD10-I70.0) and Emphysema (ICD10-J43.9). Electronically Signed   By: Delanna Ahmadi M.D.   On: 07/09/2022 13:27   CT ABDOMEN PELVIS WO CONTRAST  Result Date: 06/26/2022 CLINICAL DATA:  Abdominal pain, acute, nonlocalized EXAM: CT ABDOMEN AND PELVIS WITHOUT CONTRAST TECHNIQUE: Multidetector CT imaging of the abdomen and pelvis was performed following the standard protocol without IV contrast. RADIATION DOSE REDUCTION: This exam was performed according to the departmental dose-optimization program which includes automated exposure control, adjustment of the mA and/or kV according to patient size and/or use of iterative reconstruction technique. COMPARISON:  CT chest 02/02/2021, PET CT 06/11/2019 FINDINGS: Lower chest: Similar-appearing loculated right pleural effusion with pleural thickening. Similarly  partially visualized right lung bronchiectasis and architectural distortion. Small hiatal hernia. Hepatobiliary: No focal liver abnormality. No gallstones, gallbladder wall thickening, or pericholecystic fluid. No biliary dilatation. Pancreas: No focal lesion. Normal pancreatic contour. No surrounding inflammatory changes. No main pancreatic ductal dilatation. Spleen: Normal in size without focal abnormality. Adrenals/Urinary Tract: No adrenal nodule bilaterally. No nephrolithiasis and no hydronephrosis. No definite contour-deforming renal mass. No ureterolithiasis or hydroureter. The urinary bladder is unremarkable. Stomach/Bowel: Stomach is within normal limits. No evidence of bowel wall thickening or dilatation. Scattered colonic diverticulosis. Appendix appears normal. Vascular/Lymphatic: No abdominal aorta or iliac aneurysm. Moderate - severe atherosclerotic plaque of the aorta and its branches. No abdominal, pelvic, or inguinal lymphadenopathy. Reproductive: Grossly similar-appearing Coarsely calcified uterine fibroids. Otherwise uterus and bilateral adnexa are unremarkable. Other: No intraperitoneal free fluid. No intraperitoneal free gas. No organized fluid collection. Musculoskeletal: No hernia. Chronic oval subcutaneus soft tissue density along the lower anterior pelvic soft tissues (2:67). No suspicious lytic or blastic osseous lesions. No acute displaced fracture. IMPRESSION: 1. No acute intra-abdominal intrapelvic abnormality with limited evaluation on this noncontrast study. 2. Colonic diverticulosis with no acute diverticulitis. 3. Degenerative uterine fibroids. 4. Small hiatal hernia. 5. Partially visualized chronic changes of the right lung. 6.  Aortic Atherosclerosis (ICD10-I70.0). Electronically Signed   By: Iven Finn M.D.   On: 06/26/2022 23:08    Microbiology: No results found for this or any previous visit (from the past 240 hour(s)).   Labs: Basic  Metabolic Panel: Recent Labs   Lab 07/09/22 1049 07/09/22 1814 07/10/22 0533 07/11/22 0517  NA 137  --  136 137  K 4.4  --  4.0 4.3  CL 99  --  102 103  CO2 29  --  27 26  GLUCOSE 97  --  82 86  BUN 10  --  10 7*  CREATININE 1.24* 1.08* 1.16* 1.10*  CALCIUM 12.9*  --  9.4 9.1  PHOS  --   --   --  3.0   Liver Function Tests: Recent Labs  Lab 07/09/22 1049 07/10/22 0533 07/11/22 0517  AST 13* 15  --   ALT 6 7  --   ALKPHOS 49 47  --   BILITOT 0.6 0.8  --   PROT 8.2* 7.3  --   ALBUMIN 4.3 3.4* 3.6   Recent Labs  Lab 07/09/22 1049  LIPASE 12   No results for input(s): "AMMONIA" in the last 168 hours. CBC: Recent Labs  Lab 07/09/22 1049 07/09/22 1814 07/10/22 0533 07/11/22 0517  WBC 3.7* 3.5* 3.2* 2.8*  NEUTROABS 2.3  --   --  1.4*  HGB 13.2 12.5 12.1 12.5  HCT 39.1 38.6 36.8 37.6  MCV 99.2 104.3* 102.5* 103.3*  PLT 419* 374 437* 388   Cardiac Enzymes: No results for input(s): "CKTOTAL", "CKMB", "CKMBINDEX", "TROPONINI" in the last 168 hours. BNP: BNP (last 3 results) No results for input(s): "BNP" in the last 8760 hours.  ProBNP (last 3 results) No results for input(s): "PROBNP" in the last 8760 hours.  CBG: Recent Labs  Lab 07/09/22 1231  GLUCAP 94       Signed:  Kayleen Memos, MD [facility] 07/11/2022, 11:46 AM

## 2022-07-12 ENCOUNTER — Ambulatory Visit: Payer: 59 | Admitting: Neurology

## 2022-07-12 ENCOUNTER — Telehealth: Payer: Self-pay

## 2022-07-12 ENCOUNTER — Encounter: Payer: Self-pay | Admitting: Neurology

## 2022-07-12 DIAGNOSIS — Z029 Encounter for administrative examinations, unspecified: Secondary | ICD-10-CM

## 2022-07-12 LAB — PTH, INTACT AND CALCIUM
Calcium, Total (PTH): 11.5 mg/dL — ABNORMAL HIGH (ref 8.7–10.3)
PTH: 14 pg/mL — ABNORMAL LOW (ref 15–65)

## 2022-07-12 NOTE — Transitions of Care (Post Inpatient/ED Visit) (Signed)
   07/12/2022  Name: Janice Adams MRN: [id number] DOB: [birth year]  Today's TOC FU Call Status: Today's TOC FU Call Status:: Successful TOC FU Call Competed TOC FU Call Complete Date: 07/12/22  Transition Care Management Follow-up Telephone Call Date of Discharge: 07/11/22 Discharge Facility: [facility]) Type of Discharge: Inpatient Admission Primary Inpatient Discharge Diagnosis:: "hypercalcemia, acute cystitis, volume depletion" How have you been since you were released from the hospital?: Better (Patient pleased to report she "feels a lot better and is back to normal.") Any questions or concerns?: Yes Patient Questions/Concerns:: Patient voices she has not had a BM Patient Questions/Concerns Addressed: Other: (provided education to pt on bowel regimen mgmt, increasing fiber in diet &drinking fluids. She will go to pharmacy today to get OTC stool softener/laxative to take.)  Items Reviewed: Did you receive and understand the discharge instructions provided?: Yes Medications obtained and verified?: Yes (Medications Reviewed) Any new allergies since your discharge?: No Dietary orders reviewed?: Yes Type of Diet Ordered:: low salt Do you have support at home?: Yes People in Home: child(ren), adult Name of Support/Comfort Primary Source: [place] and Equipment/Supplies: [place] Ordered?: NA Any new equipment or medical supplies ordered?: NA  Functional Questionnaire: Do you need assistance with bathing/showering or dressing?: No Do you need assistance with eating?: No Do you have difficulty maintaining continence: No Do you need assistance with getting out of bed/getting out of a chair/moving?: No Do you have difficulty managing or taking your medications?: No  Folllow up appointments reviewed: PCP Follow-up appointment confirmed?: Yes Date of PCP follow-up appointment?: 07/26/22 Follow-up Provider: Dr. Joya Gaskins Specialist [facility]  Follow-up appointment confirmed?: Yes Date of Specialist follow-up appointment?: 07/12/22 Follow-Up Specialty Provider:: Dr. Posey Pronto Do you need transportation to your follow-up appointment?: No Do you understand care options if your condition(s) worsen?: Yes-patient verbalized understanding  SDOH Interventions Today    Flowsheet Row Most Recent Value  SDOH Interventions   Food Insecurity Interventions Intervention Not Indicated  Transportation Interventions Intervention Not Indicated  [pt states she transportation via [facility] and Medicaid]      TOC Interventions Today    Flowsheet Row Most Recent Value  TOC Interventions   TOC Interventions Discussed/Reviewed TOC Interventions Discussed      Interventions Today    Flowsheet Row Most Recent Value  Education Interventions   Education Provided Provided Education  [bowel regimen]  Provided Verbal Education On Nutrition, When to see the doctor  Nutrition Interventions   Nutrition Discussed/Reviewed Nutrition Discussed, Adding fruits and vegetables  Pharmacy Interventions   Pharmacy Dicussed/Reviewed Pharmacy Topics Discussed, Medications and their functions       Hetty Blend [facility] Care Management Care Management Community Coordinator Direct Phone: [phone] Toll Free: [phone] Fax: [phone]

## 2022-07-13 ENCOUNTER — Telehealth: Payer: Self-pay

## 2022-07-13 DIAGNOSIS — E039 Hypothyroidism, unspecified: Secondary | ICD-10-CM

## 2022-07-13 NOTE — Telephone Encounter (Signed)
I spoke to the patient and reminded her of the appointment with Dr Joya Gaskins 07/28/2022 @ [phone] and she said she was aware and will be there. Then she said she has a problem with arranging transportation. I explained to her that she has transportation provided by her insurance companies and I can refer her to [facility] for care management to assist with transportation arrangements and she was in agreement.  Referral then placed for care management

## 2022-07-14 ENCOUNTER — Telehealth: Payer: Self-pay | Admitting: *Deleted

## 2022-07-14 ENCOUNTER — Ambulatory Visit: Payer: Self-pay

## 2022-07-14 ENCOUNTER — Telehealth: Payer: Self-pay | Admitting: Critical Care Medicine

## 2022-07-14 NOTE — Telephone Encounter (Signed)
Pt called, line just kept ringing, no VM available to LVM. Will try again.   Summary: Medication Advice   Pt is calling to report that her omeprazole (PRILOSEC) 20 MG capsule [id number] is over $100. But will free at the 17th of the month. Pt reports that she does not understand that. Please advise CB- [phone]

## 2022-07-14 NOTE — Progress Notes (Signed)
  Care Coordination   Note   07/14/2022 Name: Maryella Siegmund MRN: [id number] DOB: 08/11/1955  Tiffanyamber Szymanski is a 67 y.o. year old female who sees Elsie Stain, MD for primary care. I reached out to [name] by phone today to offer care coordination services.  Ms. Sieling was given information about Care Coordination services today including:   The Care Coordination services include support from the care team which includes your Nurse Coordinator, Clinical Social Worker, or Pharmacist.  The Care Coordination team is here to help remove barriers to the health concerns and goals most important to you. Care Coordination services are voluntary, and the patient may decline or stop services at any time by request to their care team member.   Care Coordination Consent Status: Patient agreed to services and verbal consent obtained.   Follow up plan:  Telephone appointment with care coordination team member scheduled for:  07/18/22  Encounter Outcome:  Pt. Scheduled  [place]  Direct Dial: [phone]

## 2022-07-14 NOTE — Telephone Encounter (Signed)
  Chief Complaint: medication problem Symptoms: NA Frequency:  Pertinent Negatives: NA Disposition: '[]'$ ED /'[]'$ Urgent Care (no appt availability in office) / '[]'$ Appointment(In office/virtual)/ '[]'$  [place] [facility]/ '[]'$ Home Care/ '[]'$ Refused Recommended Disposition /'[]'$ [place] Mobile [facility]/ '[]'$  Follow-up with PCP Additional Notes: pt states pharmacy told her medication was $100 but after the 17th of month would be free. I pulled up Good Rx price and explained that pt can ask for price without using insurance but pt declined. She is going to wait until the 17th and go back when its free. Pt didn't need further assistance.   Reason for Disposition  Caller has medicine question, adult has minor symptoms, caller declines triage, AND triager answers question  Answer Assessment - Initial Assessment Questions 1. NAME of MEDICINE: "What medicine(s) are you calling about?"     Omeprazole  2. QUESTION: "What is your question?" (e.g., double dose of medicine, side effect)     Medication too expensive $100 but if waits until 17th will be free  3. PRESCRIBER: "Who prescribed the medicine?" Reason: if prescribed by specialist, call should be referred to that group.     Dr. Joya Gaskins  4. SYMPTOMS: "Do you have any symptoms?" If Yes, ask: "What symptoms are you having?"  "How bad are the symptoms (e.g., mild, moderate, severe)     NA  Protocols used: Medication Question Call-A-AH

## 2022-07-15 ENCOUNTER — Telehealth: Payer: Self-pay

## 2022-07-15 NOTE — Patient Outreach (Signed)
  Care Coordination   Follow Up Visit Note   07/15/2022 Name: Janice Adams MRN: [id number] DOB: 1956-03-05  Janice Adams is a 67 y.o. year old female who sees Elsie Stain, MD for primary care. I spoke with  [name] by phone today to follow up on EMMI-Alert received on  07/14/22 where patient responded:"Sad, hopeless, anxious or empty? Yes" Reviewed and addressed red alert with patient. She denies feeling that way-states she has feeling okay-her mood is okay. She denies the need for any further interventions at this time. Patient reported that she "spoke with transportation people and transportation arranged for her appt on 07/26/22."    SDOH assessments and interventions completed:  No     Care Coordination Interventions:  Yes, provided    TOC Interventions Today    Flowsheet Row Most Recent Value  TOC Interventions   TOC Interventions Discussed/Reviewed TOC Interventions Reviewed      Interventions Today    Flowsheet Row Most Recent Value  Education Interventions   Education Provided Provided Education  Mental Health Interventions   Mental Health Discussed/Reviewed Mental Health Discussed, Anxiety, Other       Follow up plan: No further intervention required.   Encounter Outcome:  Pt. Visit Completed    Hetty Blend [facility] Care Management Care Management Community Coordinator Direct Phone: [phone] Toll Free: [phone] Fax: [phone]

## 2022-07-18 ENCOUNTER — Ambulatory Visit: Payer: Self-pay

## 2022-07-18 NOTE — Patient Instructions (Signed)
Visit Information  Thank you for taking time to visit with me today. Please don't hesitate to contact me if I can be of assistance to you.   Following are the goals we discussed today:   Goals Addressed             This Visit's Progress    COMPLETED: Care Coordination Activities       Care Coordination Interventions: Discussed patient has an [place] appointment on 3/19 she needs assistance with transportation  Education provided on patients health plan transportation benefit Assisted with patient with contacting [facility] to schedule trip.        -Patient to be picked up from her home on 3/19 at 8:30 am Trip ID # [id number]       -Patient to call [phone] when ready to return home from the providers office reference trip ID # [id number] Collaboration with patients primary care providers office to provide return trip information in order to assist the patient        If you are experiencing a Mental Health or [place] or need someone to talk to, please call 911  The patient verbalized understanding of instructions, educational materials, and care plan provided today and DECLINED offer to receive copy of patient instructions, educational materials, and care plan.   No further follow up required: Please contact your primary care provider as needed  Daneen Schick, Arita Miss, CDP Social Worker, Certified Dementia Practitioner [place] Coordination [phone]

## 2022-07-18 NOTE — Patient Outreach (Signed)
  Care Coordination   Initial Visit Note   07/18/2022 Name: Janice Adams MRN: [id number] DOB: Jul 17, 1955  Janice Adams is a 67 y.o. year old female who sees Elsie Stain, MD for primary care. I spoke with  [name] by phone today.  What matters to the patients health and wellness today?  Transportation to appointment on 3/19    Goals Addressed             This Visit's Progress    COMPLETED: Care Coordination Activities       Care Coordination Interventions: Discussed patient has an [place] appointment on 3/19 she needs assistance with transportation  Education provided on patients health plan transportation benefit Assisted with patient with contacting [facility] to schedule trip.        -Patient to be picked up from her home on 3/19 at 8:30 am Trip ID # [id number]       -Patient to call [phone] when ready to return home from the providers office reference trip ID # [id number] Collaboration with patients primary care providers office to provide return trip information in order to assist the patient         SDOH assessments and interventions completed:  Yes  SDOH Interventions Today    Flowsheet Row Most Recent Value  SDOH Interventions   Transportation Interventions Payor Benefit        Care Coordination Interventions:  Yes, provided   Interventions Today    Flowsheet Row Most Recent Value  Chronic Disease   Chronic disease during today's visit Other  General Interventions   General Interventions Discussed/Reviewed General Interventions Discussed, Doctor Visits  Doctor Visits Discussed/Reviewed Doctor Visits Reviewed  Education Interventions   Education Provided Provided Printed Education, Provided Education  Provided Verbal Education On Insurance Plans  [transportation benefit]        Follow up plan: No further intervention required.   Encounter Outcome:  Pt. Visit Completed   Daneen Schick, BSW, CDP Social Worker,  Certified Dementia Practitioner [place] Management  Care Coordination [phone]

## 2022-07-24 NOTE — Progress Notes (Signed)
Established Patient Office Visit  Subjective   Patient ID: Janice Adams, female    DOB: [birth year]  Age: 67 y.o. MRN: [id number]  No chief complaint on file.   67 y.o.F not seen since 03/2021 by Joya Gaskins  Admit date: 07/09/2022 Discharge date: 07/11/2022   Time spent: 35 minutes.   Recommendations for Outpatient Follow-up:  1. Follow-up with your primary care provider. 2. Obtain GI referral from your PCP. 3. Take your medications as prescribed.   Discharge Diagnoses:  Active Hospital Problems   Diagnosis Date Noted  Abdominal pain 07/09/2022  Volume depletion 07/10/2022  Hypercalcemia 07/09/2022  Hypothyroidism 04/06/2021  UTI (urinary tract infection) 08/19/2019  GERD (gastroesophageal reflux disease) 11/08/2012   Resolved Hospital Problems No resolved problems to display.     Discharge Condition: Stable   Diet recommendation: Resume previous diet.   Vitals:   07/10/22 2030 07/11/22 0504 BP: 122/86 122/87 Pulse: 91 94 Resp: 18 18 Temp: 97.9 F (36.6 C) 97.8 F (36.6 C) SpO2: 91% 95%     History of present illness:  Patient is a 67 year old African-American female with past medical history significant for lung cancer s/p resection, GERD and hypertension.  Patient was admitted with 4-day history of abdominal pain.  Volume depleted on presentation.  Received IV fluid and IV PPI with improvement of her symptomatology.  Urinalysis revealed many bacteria, asymptomatic.  Urine culture in process.  Her abdominal pain resolved.  She was able to tolerate a diet.  No recurrent abdominal pain, no nausea or vomiting.   07/11/2022: The patient was seen and examined at bedside.  There were no acute events overnight.  She ate her breakfast without any difficulty.  She has no new complaints and is eager to go home.  Advised to take her medications as prescribed.  The patient was receptive.   Hospital Course:  Principal Problem:   Abdominal pain Active Problems:   GERD  (gastroesophageal reflux disease)   UTI (urinary tract infection)   Hypothyroidism   Hypercalcemia   Volume depletion   Resolved, abdominal pain: -History of GERD.   -Patient was off her home PPI for few days, resume. Received IV Protonix.   -Epigastric pain has resolved.   -Continue home PPI  Follow-up with GI primary care provider, obtain GI referral from your PCP.   GERD (gastroesophageal reflux disease): Received IV PPI. -Follow-up with GI on discharge.  Obtain GI referral from your PCP. Resume home omeprazole, 20 mg daily.   Asymptomatic bacteriuria:  -Urine culture, in process.    Resolved, volume depletion: Received IV fluids Euvolemic on exam   Hypothyroidism: Continue home Synthroid. TSH 225, made the patient aware, she stated she was not taking it regularly.  Stated she had plenty of the medication and did not need a refill. Repeat TSH and obtain free T4 level outpatient in 4-6 weeks. Advised on the importance of medication compliance.  The patient was receptive. Take your medications as prescribed Follow-up with your primary care provider   Medication noncompliance Noncompliant with home thyroid medication Ran out of home omeprazole, refilled, 20 mg daily x 90 days. Advised on the importance of medication compliance, the patient was receptive.   Resolved, hypercalcemia: Suspect related to dehydration.    DVT prophylaxis: Subcutaneous Lovenox daily. Code Status: Full code. Family Communication:  Disposition Plan: Home eventually.  ]    {History (Optional):23778}  ROS    Objective:     LMP  (LMP Unknown)  {Vitals History (Optional):23777}  Physical Exam  No results found for any visits on 07/26/22.  {Labs (Optional):23779}  The 10-year ASCVD risk score (Arnett DK, et al., 2019) is: 10.2%    Assessment & Plan:   Problem List Items Addressed This Visit   None   No follow-ups on file.    Asencion Noble, MD

## 2022-07-26 ENCOUNTER — Ambulatory Visit: Payer: 59 | Attending: Critical Care Medicine | Admitting: Critical Care Medicine

## 2022-07-26 ENCOUNTER — Encounter: Payer: Self-pay | Admitting: Critical Care Medicine

## 2022-07-26 ENCOUNTER — Telehealth: Payer: 59 | Admitting: Critical Care Medicine

## 2022-07-26 ENCOUNTER — Other Ambulatory Visit: Payer: Self-pay

## 2022-07-26 VITALS — BP 128/78 | HR 90 | Wt 198.8 lb

## 2022-07-26 DIAGNOSIS — R109 Unspecified abdominal pain: Secondary | ICD-10-CM

## 2022-07-26 DIAGNOSIS — D7589 Other specified diseases of blood and blood-forming organs: Secondary | ICD-10-CM | POA: Diagnosis not present

## 2022-07-26 DIAGNOSIS — E039 Hypothyroidism, unspecified: Secondary | ICD-10-CM | POA: Diagnosis not present

## 2022-07-26 DIAGNOSIS — R319 Hematuria, unspecified: Secondary | ICD-10-CM

## 2022-07-26 DIAGNOSIS — K219 Gastro-esophageal reflux disease without esophagitis: Secondary | ICD-10-CM | POA: Diagnosis not present

## 2022-07-26 DIAGNOSIS — N390 Urinary tract infection, site not specified: Secondary | ICD-10-CM | POA: Diagnosis not present

## 2022-07-26 DIAGNOSIS — Z139 Encounter for screening, unspecified: Secondary | ICD-10-CM

## 2022-07-26 DIAGNOSIS — E869 Volume depletion, unspecified: Secondary | ICD-10-CM | POA: Diagnosis not present

## 2022-07-26 DIAGNOSIS — Z1231 Encounter for screening mammogram for malignant neoplasm of breast: Secondary | ICD-10-CM

## 2022-07-26 DIAGNOSIS — C342 Malignant neoplasm of middle lobe, bronchus or lung: Secondary | ICD-10-CM

## 2022-07-26 DIAGNOSIS — Z1211 Encounter for screening for malignant neoplasm of colon: Secondary | ICD-10-CM | POA: Insufficient documentation

## 2022-07-26 DIAGNOSIS — Z87891 Personal history of nicotine dependence: Secondary | ICD-10-CM | POA: Diagnosis not present

## 2022-07-26 MED ORDER — OMEPRAZOLE 20 MG PO CPDR: DELAYED_RELEASE_CAPSULE | ORAL | 2 refills | Status: DC

## 2022-07-26 MED ORDER — VITAMIN D (ERGOCALCIFEROL) 1.25 MG (50000 UNIT) PO CAPS: 50000.0000 [IU] | ORAL_CAPSULE | ORAL | 3 refills | Status: DC

## 2022-07-26 MED ORDER — LEVOTHYROXINE SODIUM 150 MCG PO TABS: 150.0000 ug | ORAL_TABLET | Freq: Every day | ORAL | 2 refills | Status: DC

## 2022-07-26 NOTE — Assessment & Plan Note (Signed)
Referral to colonoscopy

## 2022-07-26 NOTE — Assessment & Plan Note (Signed)
Assess B12 and folate

## 2022-07-26 NOTE — Assessment & Plan Note (Signed)
Reassess ionized calcium

## 2022-07-26 NOTE — Assessment & Plan Note (Signed)
Resolved

## 2022-07-26 NOTE — Assessment & Plan Note (Signed)
Has remained off tobacco products

## 2022-07-26 NOTE — Assessment & Plan Note (Signed)
Referral to gastroenterology

## 2022-07-26 NOTE — Assessment & Plan Note (Signed)
Will resume Pepcid daily and refer to gastroenterology

## 2022-07-26 NOTE — Assessment & Plan Note (Signed)
Reassess thyroid function in the next 4 weeks ensure the patient is taking Synthroid daily refills given

## 2022-07-26 NOTE — Assessment & Plan Note (Signed)
No evidence of active urinary tract infection this is resolved

## 2022-07-26 NOTE — Assessment & Plan Note (Signed)
No evidence of recurrence will continue to monitor

## 2022-07-26 NOTE — Patient Instructions (Signed)
Labwork today Mammogram and colonoscopy ordered All medications refilled  Repeat thyroid test in 6 weeks Return 6 months

## 2022-08-09 ENCOUNTER — Other Ambulatory Visit: Payer: Self-pay

## 2022-08-22 ENCOUNTER — Encounter: Payer: Self-pay | Admitting: Internal Medicine

## 2022-09-06 ENCOUNTER — Ambulatory Visit: Payer: 59 | Attending: Critical Care Medicine

## 2022-09-06 DIAGNOSIS — E039 Hypothyroidism, unspecified: Secondary | ICD-10-CM

## 2022-09-07 ENCOUNTER — Other Ambulatory Visit: Payer: Self-pay | Admitting: Physician Assistant

## 2022-09-07 ENCOUNTER — Telehealth: Payer: Self-pay

## 2022-09-07 DIAGNOSIS — E039 Hypothyroidism, unspecified: Secondary | ICD-10-CM

## 2022-09-07 LAB — THYROID PANEL WITH TSH
Free Thyroxine Index: 5.7 — ABNORMAL HIGH (ref 1.2–4.9)
T3 Uptake Ratio: 38 % (ref 24–39)
T4, Total: 15.1 ug/dL — ABNORMAL HIGH (ref 4.5–12.0)
TSH: 1.65 u[IU]/mL (ref 0.450–4.500)

## 2022-09-07 NOTE — Telephone Encounter (Signed)
Pt was called and is aware of results, DOB was confirmed.  APPOINTMENT MADE

## 2022-09-07 NOTE — Telephone Encounter (Signed)
-----   Message from Roney Jaffe, [place] sent at 09/07/2022 11:07 AM EDT ----- Please call patient and let her know that her thyroid function is back within normal limits, she should have this rechecked again in 6 weeks, continue with current regimen.

## 2022-09-27 ENCOUNTER — Ambulatory Visit: Payer: 59 | Attending: Critical Care Medicine

## 2022-09-29 ENCOUNTER — Other Ambulatory Visit: Payer: Self-pay | Admitting: Critical Care Medicine

## 2022-09-29 DIAGNOSIS — E039 Hypothyroidism, unspecified: Secondary | ICD-10-CM

## 2022-09-29 MED ORDER — LEVOTHYROXINE SODIUM 150 MCG PO TABS: 150.0000 ug | ORAL_TABLET | Freq: Every day | ORAL | 0 refills | Status: DC

## 2022-09-29 MED ORDER — OMEPRAZOLE 20 MG PO CPDR: DELAYED_RELEASE_CAPSULE | ORAL | 0 refills | Status: DC

## 2022-09-29 MED ORDER — VITAMIN D (ERGOCALCIFEROL) 1.25 MG (50000 UNIT) PO CAPS: 50000.0000 [IU] | ORAL_CAPSULE | ORAL | 0 refills | Status: DC

## 2022-09-29 NOTE — Telephone Encounter (Signed)
Requested medication (s) are due for refill today: No  Requested medication (s) are on the active medication list: Yes  Last refill:  07/26/22  Future visit scheduled: Yes  Notes to clinic:  All medications refilled 3/19.24 with refills.    Requested Prescriptions  Pending Prescriptions Disp Refills   levothyroxine (SYNTHROID) 150 MCG tablet 90 tablet 2    Sig: Take 1 tablet (150 mcg total) by mouth daily before breakfast.     Endocrinology:  Hypothyroid Agents Passed - 09/29/2022  3:43 PM      Passed - TSH in normal range and within 360 days    TSH  Date Value Ref Range Status  09/06/2022 1.650 0.450 - 4.500 uIU/mL Final         Passed - Valid encounter within last 12 months    Recent Outpatient Visits           2 months ago Hypothyroidism, unspecified type   [facility] Storm Frisk, MD   1 year ago Hypothyroidism, unspecified type   [facility] Storm Frisk, MD   1 year ago Paresthesia   [place] [facility] [place], [place], [place]   2 years ago Adenocarcinoma of right lung [facility])   [place] [facility] Hoy Register, MD   3 years ago Sepsis, due to unspecified organism, unspecified whether acute organ dysfunction present [facility])   [place] [facility] [place], Marzella Schlein, [place]       Future Appointments             In 3 months Delford Field Charlcie Cradle, MD [facility]             omeprazole (PRILOSEC) 20 MG capsule 90 capsule 2    Sig: TAKE 1 CAPSULE(20 MG) BY MOUTH DAILY     Gastroenterology: Proton Pump Inhibitors Passed - 09/29/2022  3:43 PM      Passed - Valid encounter within last 12 months    Recent Outpatient Visits           2 months ago Hypothyroidism, unspecified type   [facility] Storm Frisk, MD   1 year ago Hypothyroidism,  unspecified type   [facility] Storm Frisk, MD   1 year ago Paresthesia   [place] [facility] [place], [place], [place]   2 years ago Adenocarcinoma of right lung [facility])   [place] [facility] Hoy Register, MD   3 years ago Sepsis, due to unspecified organism, unspecified whether acute organ dysfunction present [facility])   [place] [facility] [place], [place], [place]       Future Appointments             In 3 months Storm Frisk, MD [place] [facility]             Vitamin D, Ergocalciferol, (DRISDOL) 1.25 MG (50000 UNIT) CAPS capsule 16 capsule 3    Sig: Take 1 capsule (50,000 Units total) by mouth every 7 (seven) days.     Endocrinology:  Vitamins - Vitamin D Supplementation 2 Failed - 09/29/2022  3:43 PM      Failed - Manual Review: Route requests for 50,000 IU strength to the provider      Failed -  Vitamin D in normal range and within 360 days    Vit D, 25-Hydroxy  Date Value Ref Range Status  07/09/2022 26.50 (L) 30 - 100 ng/mL Final    Comment:    (NOTE) Vitamin D deficiency has been defined by the Institute of Medicine  and an Endocrine Society practice guideline as a level of serum 25-OH  vitamin D less than 20 ng/mL (1,2). The Endocrine Society went on to  further define vitamin D insufficiency as a level between 21 and 29  ng/mL (2).  1. IOM (Institute of Medicine). 2010. Dietary reference intakes for  calcium and D. [place]: The [employer]. 2. Holick MF, Binkley Hansell, Bischoff-Ferrari HA, et al. Evaluation,  treatment, and prevention of vitamin D deficiency: an Endocrine  Society clinical practice guideline, JCEM. 2011 Jul; 96(7): 1911-30.  Performed at [facility], [address], [place], [place] [address]          Passed - Ca in normal range and within 360 days    Calcium  Date Value Ref  Range Status  07/11/2022 9.1 8.9 - 10.3 mg/dL Final   Calcium, Total (PTH)  Date Value Ref Range Status  07/09/2022 11.5 (H) 8.7 - 10.3 mg/dL Final   Calcium, Ion  Date Value Ref Range Status  05/05/2011 1.15 1.12 - 1.32 mmol/L Final         Passed - Valid encounter within last 12 months    Recent Outpatient Visits           2 months ago Hypothyroidism, unspecified type   [facility] Storm Frisk, MD   1 year ago Hypothyroidism, unspecified type   [facility] Storm Frisk, MD   1 year ago Paresthesia   [facility] [place], [place], [place]   2 years ago Adenocarcinoma of right lung [facility])   [place] [facility] Hoy Register, MD   3 years ago Sepsis, due to unspecified organism, unspecified whether acute organ dysfunction present [facility])   [place] [facility] [place], Marzella Schlein, [place]       Future Appointments             In 3 months Delford Field, Charlcie Cradle, MD [place] [facility]

## 2022-09-29 NOTE — Telephone Encounter (Signed)
Medication Refill - Medication: levothyroxine (SYNTHROID) 150 MCG tablet omeprazole (PRILOSEC) 20 MG capsule Vitamin D, Ergocalciferol, (DRISDOL) 1.25 MG (50000 UNIT) CAPS capsule   Has the patient contacted their pharmacy? No.  (Preferred Pharmacy (with phone number or street name): [id number] [place] Otto, [place] - [address] AT [facility]  Phone: [phone] Fax: [phone]  Has the patient been seen for an appointment in the last year OR does the patient have an upcoming appointment? Yes.    Agent: Please be advised that RX refills may take up to 3 business days. We ask that you follow-up with your pharmacy.

## 2022-10-19 ENCOUNTER — Other Ambulatory Visit: Payer: 59

## 2022-11-08 ENCOUNTER — Ambulatory Visit: Payer: Self-pay

## 2022-11-08 ENCOUNTER — Telehealth: Payer: Self-pay | Admitting: Medical Oncology

## 2022-11-08 NOTE — Telephone Encounter (Signed)
Requests Letter for [employer] to move her  section 8 Housing in  [place] to section 8 housing back to [place], [place]. I told her to contact her Child psychotherapist at [facility].

## 2022-11-08 NOTE — Patient Outreach (Signed)
  Care Coordination   Follow Up Visit Note   11/08/2022 Name: Janice Adams MRN: [id number] DOB: February 21, 1956  Janice Adams is a 67 y.o. year old female who sees Storm Frisk, MD for primary care. I spoke with  Janice Adams by phone today.  What matters to the patients health and wellness today?  The patient Higher education careers adviser today requesting a letter be sent to Section 8 on her behalf to transfer her Section 8 benefit to [facility]. Advised the patient this SW does not write letters for Section 8. Collaboration with patients primary care providers office who advises the patient will need to work with her housing caseworker regarding this transition. Collaboration with LCSW Janice Adams who is knowledgeable of requirements for a transition and will advise the patient of next steps.    SDOH assessments and interventions completed:  No     Care Coordination Interventions:  Yes, provided   Interventions Today    Flowsheet Row Most Recent Value  Chronic Disease   Chronic disease during today's visit Other  [Housing Assistance]  General Interventions   General Interventions Discussed/Reviewed General Interventions Discussed, Communication with  [Referral to LCSW to assist patient with navigation of Section 8 Process]  Communication with PCP/Specialists, Social Work       Follow up plan: Referral made to LCSW Janice Adams.    Encounter Outcome:  Pt. Visit Completed   Janice Adams, BSW, CDP Social Worker, Certified Dementia Practitioner [facility]  Care Coordination [phone]

## 2022-11-09 ENCOUNTER — Ambulatory Visit: Payer: 59 | Attending: Critical Care Medicine

## 2022-11-09 VITALS — Ht 64.0 in | Wt 198.0 lb

## 2022-11-09 DIAGNOSIS — Z0000 Encounter for general adult medical examination without abnormal findings: Secondary | ICD-10-CM | POA: Diagnosis not present

## 2022-11-09 NOTE — Progress Notes (Signed)
Subjective:   Janice Adams is a 67 y.o. female who presents for an Initial Medicare Annual Wellness Visit.  Visit Complete: Virtual  I connected with  Janice Adams on 11/09/22 by a audio enabled telemedicine application and verified that I am speaking with the correct person using two identifiers.  Patient Location: Home  Provider Location: Home Office  I discussed the limitations of evaluation and management by telemedicine. The patient expressed understanding and agreed to proceed.  Review of Systems     Cardiac Risk Factors include: advanced age (>72men, >41 women);sedentary lifestyle     Objective:    Today's Vitals   11/09/22 0917  Weight: 198 lb (89.8 kg)  Height: 5\' 4"  (1.626 m)   Body mass index is 33.99 kg/m.     11/09/2022    3:15 PM 07/09/2022   10:16 AM 02/03/2021   12:28 PM 01/06/2020    2:33 PM 12/30/2019    6:16 AM 12/26/2019    3:09 PM 12/02/2019   10:14 AM  Advanced Directives  Does Patient Have a Medical Advance Directive? No No No No No No No  Would patient like information on creating a medical advance directive? Yes (MAU/Ambulatory/Procedural Areas - Information given) No - Patient declined   No - Patient declined No - Patient declined No - Patient declined    Current Medications (verified) Outpatient Encounter Medications as of 11/09/2022  Medication Sig   acetaminophen (TYLENOL) 500 MG tablet Take 2 tablets (1,000 mg total) by mouth every 6 (six) hours.   levothyroxine (SYNTHROID) 150 MCG tablet Take 1 tablet (150 mcg total) by mouth daily before breakfast.   Multiple Vitamin (MULTIVITAMIN WITH MINERALS) TABS tablet Take 1 tablet by mouth daily.   omeprazole (PRILOSEC) 20 MG capsule TAKE 1 CAPSULE(20 MG) BY MOUTH DAILY   Vitamin D, Ergocalciferol, (DRISDOL) 1.25 MG (50000 UNIT) CAPS capsule Take 1 capsule (50,000 Units total) by mouth every 7 (seven) days.   No facility-administered encounter medications on file as of 11/09/2022.    Allergies  (verified) Ivp dye [iodinated contrast media]   History: Past Medical History:  Diagnosis Date   Back pain    Cancer (HCC)    Lung Cancer   GERD (gastroesophageal reflux disease)    Gout    no current problems per patient 05/31/19   History of epigastric pain    comes and goes   Hypertension    no meds   UTI (urinary tract infection) 08/19/2019   Past Surgical History:  Procedure Laterality Date   INTERCOSTAL NERVE BLOCK Right 12/30/2019   Procedure: INTERCOSTAL NERVE BLOCK;  Surgeon: Loreli Slot, MD;  Location: [facility] OR;  Service: Thoracic;  Laterality: Right;   LYMPH NODE DISSECTION Right 12/30/2019   Procedure: LYMPH NODE DISSECTION;  Surgeon: Loreli Slot, MD;  Location: [facility] OR;  Service: Thoracic;  Laterality: Right;   MULTIPLE TOOTH EXTRACTIONS     TONSILLECTOMY     uterine ablation     VIDEO BRONCHOSCOPY N/A 12/30/2019   Procedure: VIDEO BRONCHOSCOPY;  Surgeon: Loreli Slot, MD;  Location: [facility] OR;  Service: Thoracic;  Laterality: N/A;   VIDEO BRONCHOSCOPY WITH ENDOBRONCHIAL ULTRASOUND N/A 06/04/2019   Procedure: VIDEO BRONCHOSCOPY WITH ENDOBRONCHIAL ULTRASOUND;  Surgeon: Luciano Cutter, MD;  Location: [facility] OR;  Service: Thoracic;  Laterality: N/A;   WISDOM TOOTH EXTRACTION     Family History  Problem Relation Age of Onset   Breast cancer Mother    Cancer Mother    Seizures  Father    Breast cancer Maternal Grandmother    Breast cancer Daughter    Cancer Daughter    Heart attack Brother    Social History   Socioeconomic History   Marital status: Divorced    Spouse name: Not on file   Number of children: 5   Years of education: Not on file   Highest education level: Not on file  Occupational History   Occupation: Childcare  Tobacco Use   Smoking status: Former    Packs/day: 0.50    Years: 47.00    Additional pack years: 0.00    Total pack years: 23.50    Types: Cigarettes    Quit date: 07/07/2019    Years since quitting: 3.3    Smokeless tobacco: Never  Vaping Use   Vaping Use: Never used  Substance and Sexual Activity   Alcohol use: No    Alcohol/week: 0.0 standard drinks of alcohol   Drug use: No    Comment: last use Wed 05/28/19   Sexual activity: Not on file  Other Topics Concern   Not on file  Social History Narrative   Not on file   Social Determinants of Health   Financial Resource Strain: Low Risk  (11/09/2022)   Overall Financial Resource Strain (CARDIA)    Difficulty of Paying Living Expenses: Not hard at all  Food Insecurity: No Food Insecurity (11/09/2022)   Hunger Vital Sign    Worried About Running Out of Food in the Last Year: Never true    Ran Out of Food in the Last Year: Never true  Transportation Needs: Unmet Transportation Needs (11/09/2022)   PRAPARE - Administrator, Civil Service (Medical): Yes    Lack of Transportation (Non-Medical): No  Physical Activity: Inactive (11/09/2022)   Exercise Vital Sign    Days of Exercise per Week: 0 days    Minutes of Exercise per Session: 0 min  Stress: No Stress Concern Present (11/09/2022)   [employer] of Occupational Health - Occupational Stress Questionnaire    Feeling of Stress : Not at all  Social Connections: Moderately Isolated (11/09/2022)   Social Connection and Isolation Panel [NHANES]    Frequency of Communication with Friends and Family: More than three times a week    Frequency of Social Gatherings with Friends and Family: Three times a week    Attends Religious Services: 1 to 4 times per year    Active Member of Clubs or Organizations: No    Attends Banker Meetings: Never    Marital Status: Divorced    Tobacco Counseling Counseling given: Not Answered   Clinical Intake:  Pre-visit preparation completed: Yes  Pain : No/denies pain     Diabetes: No  How often do you need to have someone help you when you read instructions, pamphlets, or other written materials from your doctor or pharmacy?: 1 -  Never  Interpreter Needed?: No  Information entered by :: Kandis Fantasia LPN   Activities of Daily Living    11/09/2022    3:14 PM 07/09/2022    3:39 PM  In your present state of health, do you have any difficulty performing the following activities:  Hearing? 0 0  Vision? 0 0  Difficulty concentrating or making decisions? 0 0  Walking or climbing stairs? 0 0  Dressing or bathing? 0 0  Doing errands, shopping? 0 0  Preparing Food and eating ? N   Using the Toilet? N   In the past  six months, have you accidently leaked urine? N   Do you have problems with loss of bowel control? N   Managing your Medications? N   Managing your Finances? N   Housekeeping or managing your Housekeeping? N     Patient Care Team: Storm Frisk, MD as PCP - General (Pulmonary Disease) Syliva Overman, RN as Oncology Nurse Navigator  Indicate any recent Medical Services you may have received from other than [facility] providers in the past year (date may be approximate).     Assessment:   This is a routine wellness examination for Janice Adams.  Hearing/Vision screen Hearing Screening - Comments:: Denies hearing difficulties   Vision Screening - Comments:: No vision problems; will schedule routine eye exam soon    Dietary issues and exercise activities discussed:     Goals Addressed             This Visit's Progress    Remain active       Eventually move back to [place]       Depression Screen    11/09/2022    3:11 PM 07/26/2022    9:07 AM 04/06/2021   10:25 AM 03/10/2021    2:47 PM 01/06/2020    2:34 PM 01/06/2020    2:33 PM 05/11/2016   11:09 AM  PHQ 2/9 Scores  PHQ - 2 Score 0  4 1 0 0 0  PHQ- 9 Score   12  0 0 0  Exception Documentation  Patient refusal         Fall Risk    11/09/2022    3:13 PM 07/26/2022    9:06 AM 04/06/2021   10:25 AM 03/10/2021    2:47 PM 01/06/2020    2:34 PM  Fall Risk   Falls in the past year? 0 0 0 0 0  Number falls in past yr: 0 0 0 0   Injury  with Fall? 0 0 0 0   Risk for fall due to : No Fall Risks No Fall Risks No Fall Risks No Fall Risks   Follow up Falls prevention discussed;Education provided;Falls evaluation completed        MEDICARE RISK AT HOME:  Medicare Risk at Home - 11/09/22 1513     Any stairs in or around the home? No    If so, are there any without handrails? No    Home free of loose throw rugs in walkways, pet beds, electrical cords, etc? Yes    Adequate lighting in your home to reduce risk of falls? Yes    Life alert? No    Use of a cane, walker or w/c? No    Grab bars in the bathroom? Yes    Shower chair or bench in shower? No    Elevated toilet seat or a handicapped toilet? Yes             TIMED UP AND GO:  Was the test performed? No    Cognitive Function:        11/09/2022    3:13 PM  6CIT Screen  What Year? 0 points  What month? 0 points  What time? 0 points  Count back from 20 0 points  Months in reverse 0 points  Repeat phrase 0 points  Total Score 0 points    Immunizations Immunization History  Administered Date(s) Administered   Influenza,inj,Quad PF,6+ Mos 02/07/2019   PNEUMOCOCCAL CONJUGATE-20 04/06/2021    TDAP status: Due, Education has been provided regarding the importance  of this vaccine. Advised may receive this vaccine at local pharmacy or Health Dept. Aware to provide a copy of the vaccination record if obtained from local pharmacy or Health Dept. Verbalized acceptance and understanding.  Pneumococcal vaccine status: Up to date  Covid-19 vaccine status: Information provided on how to obtain vaccines.   Qualifies for Shingles Vaccine? Yes   Zostavax completed No   Shingrix Completed?: No.    Education has been provided regarding the importance of this vaccine. Patient has been advised to call insurance company to determine out of pocket expense if they have not yet received this vaccine. Advised may also receive vaccine at local pharmacy or Health Dept. Verbalized  acceptance and understanding.  Screening Tests Health Maintenance  Topic Date Due   DTaP/Tdap/Td (1 - Tdap) Never done   MAMMOGRAM  10/19/2018   Zoster Vaccines- Shingrix (1 of 2) 02/09/2023 (Originally 02/11/1975)   INFLUENZA VACCINE  12/08/2022   Medicare Annual Wellness (AWV)  11/09/2023   Pneumonia Vaccine 38+ Years old  Completed   Hepatitis C Screening  Completed   HPV VACCINES  Aged Out   DEXA SCAN  Discontinued   Colonoscopy  Discontinued   COVID-19 Vaccine  Discontinued    Health Maintenance  Health Maintenance Due  Topic Date Due   DTaP/Tdap/Td (1 - Tdap) Never done   MAMMOGRAM  10/19/2018    Colorectal cancer screening: Referral to GI placed 07/26/22. Pt aware the office will call re: appt.  Mammogram status: Ordered 07/26/22. Pt provided with contact info and advised to call to schedule appt.   Lung Cancer Screening: (Low Dose CT Chest recommended if Age 12-80 years, 20 pack-year currently smoking OR have quit w/in 15years.) does not qualify.   Lung Cancer Screening Referral: n/a  Additional Screening:  Hepatitis C Screening: does qualify; Completed 04/06/21  Vision Screening: Recommended annual ophthalmology exams for early detection of glaucoma and other disorders of the eye. Is the patient up to date with their annual eye exam?  No  Who is the provider or what is the name of the office in which the patient attends annual eye exams? None  If pt is not established with a provider, would they like to be referred to a provider to establish care? No .   Dental Screening: Recommended annual dental exams for proper oral hygiene  Community Resource Referral / Chronic Care Management: CRR required this visit?  No   CCM required this visit?  No     Plan:     I have personally reviewed and noted the following in the patient's chart:   Medical and social history Use of alcohol, tobacco or illicit drugs  Current medications and supplements including opioid  prescriptions. Patient is not currently taking opioid prescriptions. Functional ability and status Nutritional status Physical activity Advanced directives List of other physicians Hospitalizations, surgeries, and ER visits in previous 12 months Vitals Screenings to include cognitive, depression, and falls Referrals and appointments  In addition, I have reviewed and discussed with patient certain preventive protocols, quality metrics, and best practice recommendations. A written personalized care plan for preventive services as well as general preventive health recommendations were provided to patient.     Kandis Fantasia [place], [place]   05/14/1094   After Visit Summary: (Mail) Due to this being a telephonic visit, the after visit summary with patients personalized plan was offered to patient via mail   Nurse Notes: No concerns; provided number for [facility] care guide to get assistance with  changing over section 8

## 2022-11-09 NOTE — Patient Instructions (Addendum)
Janice Adams , Thank you for taking time to come for your Medicare Wellness Visit. I appreciate your ongoing commitment to your health goals. Please review the following plan we discussed and let me know if I can assist you in the future.   These are the goals we discussed:  Goals      Remain active     Eventually move back to [place]        This is a list of the screening recommended for you and due dates:  Health Maintenance  Topic Date Due   DTaP/Tdap/Td vaccine (1 - Tdap) Never done   Mammogram  10/19/2018   Zoster (Shingles) Vaccine (1 of 2) 02/09/2023*   Flu Shot  12/08/2022   Medicare Annual Wellness Visit  11/09/2023   Pneumonia Vaccine  Completed   Hepatitis C Screening  Completed   HPV Vaccine  Aged Out   DEXA scan (bone density measurement)  Discontinued   Colon Cancer Screening  Discontinued   COVID-19 Vaccine  Discontinued  *Topic was postponed. The date shown is not the original due date.    Advanced directives: Information on Advanced Care Planning can be found at [facility] Advance Health Care Directives Advance Health Care Directives ([email or web address] Please bring a copy of your health care power of attorney and living will to the office to be added to your chart at your convenience.  Conditions/risks identified: Aim for 30 minutes of exercise or brisk walking, 6-8 glasses of water, and 5 servings of fruits and vegetables each day.  Next appointment: Follow up in one year for your annual wellness visit   You have an order for:  []   2D Mammogram  [x]   3D Mammogram  []   Bone Density     Please call for appointment:   [facility] [phone]   Make sure to wear two-piece clothing.  No lotions, powders, or deodorants the day of the appointment. Make sure to bring picture ID and insurance card.  Bring list of medications you are currently taking including any supplements.   Schedule your [place] screening mammogram  through MyChart!   Log into your MyChart account.  Go to 'Visit' (or 'Appointments' if on mobile App) --> Schedule an Appointment  Under 'Select a Reason for Visit' choose the Mammogram Screening option.  Complete the pre-visit questions and select the time and place that best fits your schedule.     Preventive Care 66 Years and Older, Female Preventive care refers to lifestyle choices and visits with your health care provider that can promote health and wellness. What does preventive care include? A yearly physical exam. This is also called an annual well check. Dental exams once or twice a year. Routine eye exams. Ask your health care provider how often you should have your eyes checked. Personal lifestyle choices, including: Daily care of your teeth and gums. Regular physical activity. Eating a healthy diet. Avoiding tobacco and drug use. Limiting alcohol use. Practicing safe sex. Taking low-dose aspirin every day. Taking vitamin and mineral supplements as recommended by your health care provider. What happens during an annual well check? The services and screenings done by your health care provider during your annual well check will depend on your age, overall health, lifestyle risk factors, and family history of disease. Counseling  Your health care provider may ask you questions about your: Alcohol use. Tobacco use. Drug use. Emotional well-being. Home and relationship well-being. Sexual activity. Eating habits. History of  falls. Memory and ability to understand (cognition). Work and work Astronomer. Reproductive health. Screening  You may have the following tests or measurements: Height, weight, and BMI. Blood pressure. Lipid and cholesterol levels. These may be checked every 5 years, or more frequently if you are over 90 years old. Skin check. Lung cancer screening. You may have this screening every year starting at age 65 if you have a 30-pack-year history of  smoking and currently smoke or have quit within the past 15 years. Fecal occult blood test (FOBT) of the stool. You may have this test every year starting at age 67. Flexible sigmoidoscopy or colonoscopy. You may have a sigmoidoscopy every 5 years or a colonoscopy every 10 years starting at age 85. Hepatitis C blood test. Hepatitis B blood test. Sexually transmitted disease (STD) testing. Diabetes screening. This is done by checking your blood sugar (glucose) after you have not eaten for a while (fasting). You may have this done every 1-3 years. Bone density scan. This is done to screen for osteoporosis. You may have this done starting at age 33. Mammogram. This may be done every 1-2 years. Talk to your health care provider about how often you should have regular mammograms. Talk with your health care provider about your test results, treatment options, and if necessary, the need for more tests. Vaccines  Your health care provider may recommend certain vaccines, such as: Influenza vaccine. This is recommended every year. Tetanus, diphtheria, and acellular pertussis (Tdap, Td) vaccine. You may need a Td booster every 10 years. Zoster vaccine. You may need this after age 33. Pneumococcal 13-valent conjugate (PCV13) vaccine. One dose is recommended after age 53. Pneumococcal polysaccharide (PPSV23) vaccine. One dose is recommended after age 73. Talk to your health care provider about which screenings and vaccines you need and how often you need them. This information is not intended to replace advice given to you by your health care provider. Make sure you discuss any questions you have with your health care provider. Document Released: 05/22/2015 Document Revised: 01/13/2016 Document Reviewed: 02/24/2015 Elsevier Interactive Patient Education  2017 [employer].  Fall Prevention in the Home Falls can cause injuries. They can happen to people of all ages. There are many things you can do to make  your home safe and to help prevent falls. What can I do on the outside of my home? Regularly fix the edges of walkways and driveways and fix any cracks. Remove anything that might make you trip as you walk through a door, such as a raised step or threshold. Trim any bushes or trees on the path to your home. Use bright outdoor lighting. Clear any walking paths of anything that might make someone trip, such as rocks or tools. Regularly check to see if handrails are loose or broken. Make sure that both sides of any steps have handrails. Any raised decks and porches should have guardrails on the edges. Have any leaves, snow, or ice cleared regularly. Use sand or salt on walking paths during winter. Clean up any spills in your garage right away. This includes oil or grease spills. What can I do in the bathroom? Use night lights. Install grab bars by the toilet and in the tub and shower. Do not use towel bars as grab bars. Use non-skid mats or decals in the tub or shower. If you need to sit down in the shower, use a plastic, non-slip stool. Keep the floor dry. Clean up any water that spills on the  floor as soon as it happens. Remove soap buildup in the tub or shower regularly. Attach bath mats securely with double-sided non-slip rug tape. Do not have throw rugs and other things on the floor that can make you trip. What can I do in the bedroom? Use night lights. Make sure that you have a light by your bed that is easy to reach. Do not use any sheets or blankets that are too big for your bed. They should not hang down onto the floor. Have a firm chair that has side arms. You can use this for support while you get dressed. Do not have throw rugs and other things on the floor that can make you trip. What can I do in the kitchen? Clean up any spills right away. Avoid walking on wet floors. Keep items that you use a lot in easy-to-reach places. If you need to reach something above you, use a  strong step stool that has a grab bar. Keep electrical cords out of the way. Do not use floor polish or wax that makes floors slippery. If you must use wax, use non-skid floor wax. Do not have throw rugs and other things on the floor that can make you trip. What can I do with my stairs? Do not leave any items on the stairs. Make sure that there are handrails on both sides of the stairs and use them. Fix handrails that are broken or loose. Make sure that handrails are as long as the stairways. Check any carpeting to make sure that it is firmly attached to the stairs. Fix any carpet that is loose or worn. Avoid having throw rugs at the top or bottom of the stairs. If you do have throw rugs, attach them to the floor with carpet tape. Make sure that you have a light switch at the top of the stairs and the bottom of the stairs. If you do not have them, ask someone to add them for you. What else can I do to help prevent falls? Wear shoes that: Do not have high heels. Have rubber bottoms. Are comfortable and fit you well. Are closed at the toe. Do not wear sandals. If you use a stepladder: Make sure that it is fully opened. Do not climb a closed stepladder. Make sure that both sides of the stepladder are locked into place. Ask someone to hold it for you, if possible. Clearly mark and make sure that you can see: Any grab bars or handrails. First and last steps. Where the edge of each step is. Use tools that help you move around (mobility aids) if they are needed. These include: Canes. Walkers. Scooters. Crutches. Turn on the lights when you go into a dark area. Replace any light bulbs as soon as they burn out. Set up your furniture so you have a clear path. Avoid moving your furniture around. If any of your floors are uneven, fix them. If there are any pets around you, be aware of where they are. Review your medicines with your doctor. Some medicines can make you feel dizzy. This can  increase your chance of falling. Ask your doctor what other things that you can do to help prevent falls. This information is not intended to replace advice given to you by your health care provider. Make sure you discuss any questions you have with your health care provider. Document Released: 02/19/2009 Document Revised: 10/01/2015 Document Reviewed: 05/30/2014 Elsevier Interactive Patient Education  2017 [employer].

## 2022-11-10 ENCOUNTER — Other Ambulatory Visit: Payer: Self-pay

## 2022-11-11 ENCOUNTER — Telehealth: Payer: Self-pay | Admitting: Licensed Clinical Social Worker

## 2022-11-11 ENCOUNTER — Telehealth: Payer: Self-pay | Admitting: *Deleted

## 2022-11-11 NOTE — Patient Instructions (Signed)
Visit Information  Thank you for taking time to visit with me today. Please don't hesitate to contact me if I can be of assistance to you.   Following are the goals we discussed today:   Goals Addressed             This Visit's Progress    Obtain supportive resources-Housing   On track    Activities and task to complete in order to accomplish goals.   Keep all upcoming appointments discussed today Continue with compliance of taking medication prescribed by Doctor Implement healthy coping skills discussed to assist with management of symptoms Continue working with [facility] care team to assist with goals identified         Our next appointment is by telephone on 7/19 at 10 AM  Please call the care guide team at [phone] if you need to cancel or reschedule your appointment.   If you are experiencing a Mental Health or Behavioral Health Crisis or need someone to talk to, please call the Suicide and Crisis Lifeline: 988 call 911   The patient verbalized understanding of instructions, educational materials, and care plan provided today and DECLINED offer to receive copy of patient instructions, educational materials, and care plan.   Jenel Lucks, MSW, LCSW [facility] Care Management [place]  [facility] [email or web address] Phone [phone] 2:35 PM

## 2022-11-11 NOTE — Patient Outreach (Signed)
  Care Coordination   Initial Visit Note   11/11/2022 Name: Janice Adams MRN: [id number] DOB: 1956/04/09  Jenesis Amaral is a 67 y.o. year old female who sees Storm Frisk, MD for primary care. I spoke with  [name] by phone today.  What matters to the patients health and wellness today?  Housing    Goals Addressed             This Visit's Progress    Obtain supportive resources-Housing   On track    Activities and task to complete in order to accomplish goals.   Keep all upcoming appointments discussed today Continue with compliance of taking medication prescribed by Doctor Implement healthy coping skills discussed to assist with management of symptoms Continue working with [facility] care team to assist with goals identified         SDOH assessments and interventions completed:  No     Care Coordination Interventions:  Yes, provided  Interventions Today    Flowsheet Row Most Recent Value  General Interventions   General Interventions Discussed/Reviewed General Interventions Reviewed, [facility], Doctor Visits  [LCSW discussed process of transferring HUD voucher to another state. Pt agreed to f/up with assigned HUD CM]  Doctor Visits Discussed/Reviewed Doctor Visits Reviewed  Mental Health Interventions   Mental Health Discussed/Reviewed Mental Health Discussed, Coping Strategies  [Pt identified stressors and barriers to obtaining goal. Coping skills discussed to assist with management.]       Follow up plan: Follow up call scheduled for 2 weeks    Encounter Outcome:  Pt. Visit Completed   Jenel Lucks, MSW, LCSW [facility] Care Management [facility] Health  [facility] [email or web address] Phone [phone] 2:34 PM

## 2022-11-11 NOTE — Progress Notes (Signed)
  Care Coordination   Note   11/11/2022 Name: Bayle Schwamberger MRN: [id number] DOB: December 11, 1955  Janice Adams is a 67 y.o. year old female who sees Storm Frisk, MD for primary care. [name] contacted care guide Gwenevere Ghazi by phone today for care coordination services.  [name] was given information about Care Coordination services today including:   The Care Coordination services include support from the care team which includes your Nurse Coordinator, Clinical Social Worker, or Pharmacist.  The Care Coordination team is here to help remove barriers to the health concerns and goals most important to you. Care Coordination services are voluntary, and the patient may decline or stop services at any time by request to their care team member.   Care Coordination Consent Status: Patient agreed to services and verbal consent obtained.   Follow up plan:  Telephone appointment with care coordination team member scheduled for:  11/14/22  Encounter Outcome:  Pt. Scheduled  [facility] Coordination Care Guide  Direct Dial: [phone]

## 2022-11-22 ENCOUNTER — Other Ambulatory Visit: Payer: Self-pay | Admitting: Critical Care Medicine

## 2022-11-22 DIAGNOSIS — E039 Hypothyroidism, unspecified: Secondary | ICD-10-CM

## 2022-11-25 ENCOUNTER — Ambulatory Visit: Payer: Self-pay | Admitting: Licensed Clinical Social Worker

## 2022-11-28 NOTE — Patient Instructions (Signed)
Visit Information  Thank you for taking time to visit with me today. Please don't hesitate to contact me if I can be of assistance to you.   Following are the goals we discussed today:   Goals Addressed             This Visit's Progress    Obtain supportive resources-Housing   On track    Activities and task to complete in order to accomplish goals.   Keep all upcoming appointments discussed today Continue with compliance of taking medication prescribed by Doctor Implement healthy coping skills discussed to assist with management of symptoms Continue working with [facility] care team to assist with goals identified         Please call the care guide team at [phone] if you need to cancel or reschedule your appointment.   If you are experiencing a Mental Health or Behavioral Health Crisis or need someone to talk to, please call the Suicide and Crisis Lifeline: 988 call 911   The patient verbalized understanding of instructions, educational materials, and care plan provided today and DECLINED offer to receive copy of patient instructions, educational materials, and care plan.   Jenel Lucks, MSW, LCSW [facility] Care Management   [facility] [email or web address] Phone [phone] 11:10 PM

## 2022-11-28 NOTE — Patient Outreach (Signed)
  Care Coordination   Follow Up Visit Note   11/25/2022 Name: Lakeisha Waldrop MRN: [id number] DOB: [birth year]  Mayda Shippee is a 67 y.o. year old female who sees Storm Frisk, MD for primary care. I spoke with  [name] by phone today.  What matters to the patients health and wellness today?  Housing and symptom management    Goals Addressed             This Visit's Progress    Obtain supportive resources-Housing   On track    Activities and task to complete in order to accomplish goals.   Keep all upcoming appointments discussed today Continue with compliance of taking medication prescribed by Doctor Implement healthy coping skills discussed to assist with management of symptoms Continue working with [facility] care team to assist with goals identified         SDOH assessments and interventions completed:  No     Care Coordination Interventions:  Yes, provided   Follow up plan: Follow up call scheduled for 2-4 weeks    Encounter Outcome:  Pt. Visit Completed   Jenel Lucks, MSW, LCSW [facility] Care Management [facility] Health  [facility] [email or web address] Phone [phone] 11:09 PM

## 2022-12-07 ENCOUNTER — Ambulatory Visit: Payer: Self-pay

## 2022-12-07 NOTE — Patient Outreach (Signed)
  Care Coordination   Follow Up Visit Note   12/07/2022 Name: Kristlyn Minnifield MRN: [id number] DOB: 12-05-1955  Amberrose Nance is a 67 y.o. year old female who sees Storm Frisk, MD for primary care. I spoke with  [name] by phone today.  What matters to the patients health and wellness today?  Patient would like to transfer her Section 8 to [place] to be closer to family.   SDOH assessments and interventions completed:  No     Care Coordination Interventions:  Yes, provided   Interventions Today    Flowsheet Row Most Recent Value  Chronic Disease   Chronic disease during today's visit Other  [Desire to move to [place]]  General Interventions   General Interventions Discussed/Reviewed General Interventions Discussed  [inbound call received from the pt who requests assistance with transferring section 8,  advised to call her housing authority caseworker and to continue engaging with LCSW Jasmine Lewis]        Follow up plan: No further intervention required. Patient will remain engaged with LCSW Jenel Lucks.    Encounter Outcome:  Pt. Visit Completed   Bevelyn Ngo, BSW, CDP Social Worker, Certified Dementia Practitioner [facility] Care Management  Care Coordination [phone]

## 2022-12-07 NOTE — Patient Instructions (Signed)
Visit Information  Thank you for taking time to visit with me today. Please don't hesitate to contact me if I can be of assistance to you.   Following are the goals we discussed today:  - Contact the Housing Authority to request your Section 8 be transferred to [place] - Remain engaged with LCSW Jenel Lucks   If you are experiencing a Mental Health or Behavioral Health Crisis or need someone to talk to, please call 1-800-273-TALK (toll free, 24 hour hotline) go to [facility] [address], [place] [phone]) call 911  The patient verbalized understanding of instructions, educational materials, and care plan provided today and DECLINED offer to receive copy of patient instructions, educational materials, and care plan.   Bevelyn Ngo, BSW, CDP Social Worker, Certified Dementia Practitioner [facility] Care Management  Care Coordination [phone]

## 2022-12-13 ENCOUNTER — Ambulatory Visit: Payer: Self-pay

## 2022-12-13 NOTE — Patient Outreach (Signed)
  Care Coordination   Follow Up Visit Note   12/13/2022 Name: Elianne Hanek MRN: [id number] DOB: May 26, 1955  Phatima Toste is a 67 y.o. year old female who sees Storm Frisk, MD for primary care. I spoke with  [name] by phone today.  What matters to the patients health and wellness today?  Patient would like her Section 8 transferred to [place] so she can live closer to family.    SDOH assessments and interventions completed:  No     Care Coordination Interventions:  Yes, provided   Interventions Today    Flowsheet Row Most Recent Value  Chronic Disease   Chronic disease during today's visit Other  [Housing]  General Interventions   General Interventions Discussed/Reviewed Communication with, [facility]  [Pt contacted BSW requesting assistance with section 8 transfer. Reviewed with the pt this SW is unable to transfer section 8. Encouraged to call Housing Authority]  Communication with Social Work  [Noted pt working with [employer]. Collaboration with LCSW to advise of patients concern with section 8]        Follow up plan: No further intervention required. Patient will remain engaged with LCSW.    Encounter Outcome:  Pt. Visit Completed   Bevelyn Ngo, BSW, CDP Social Worker, Certified Dementia Practitioner [facility] Care Management  Care Coordination [phone]

## 2022-12-14 ENCOUNTER — Telehealth: Payer: Self-pay | Admitting: Critical Care Medicine

## 2022-12-14 NOTE — Telephone Encounter (Signed)
Thank you I received a fax from them as well I will put in your folder

## 2022-12-14 NOTE — Telephone Encounter (Signed)
Sophia with [facility] is calling in because she is needing a doctor's approval for a manufacture change on a drug for a pt. levothyroxine (SYNTHROID) 150 MCG tablet [[id number]] , [phone] is the call back number and the reference number is Ref#: [id number]

## 2022-12-14 NOTE — Telephone Encounter (Signed)
Call placed to [facility]. Gave reference # and confirmed that pharmacy is needing NDC change to fill levothyroxine. Last TSH/thyroid panel was 4/30//24. Her numbers had improved on 150 mcg daily dose. I gave authorization to change manufacturers at this time. [facility] will [facility] a note on the rxn to keep upcoming appt next month. We need her to keep this appointment so we can recheck her thyroid levels.

## 2022-12-22 ENCOUNTER — Ambulatory Visit
Admission: RE | Admit: 2022-12-22 | Discharge: 2022-12-22 | Disposition: A | Discharge status: Home / Self Care | Payer: 59 | Source: Ambulatory Visit | Attending: Critical Care Medicine | Admitting: Critical Care Medicine

## 2022-12-22 DIAGNOSIS — Z1231 Encounter for screening mammogram for malignant neoplasm of breast: Secondary | ICD-10-CM

## 2022-12-23 ENCOUNTER — Telehealth: Payer: Self-pay

## 2022-12-23 NOTE — Progress Notes (Signed)
Let pt know mammogram normal recheck one year

## 2022-12-23 NOTE — Telephone Encounter (Signed)
Called patient unable to make contact or leave voicemail, information sent to the nurse pool

## 2022-12-23 NOTE — Telephone Encounter (Signed)
-----   Message from Shan Levans sent at 12/23/2022  4:48 PM EDT ----- Let pt know mammogram normal recheck one year

## 2023-01-06 ENCOUNTER — Other Ambulatory Visit: Payer: Self-pay | Admitting: Critical Care Medicine

## 2023-01-10 NOTE — Telephone Encounter (Signed)
Requested Prescriptions  Pending Prescriptions Disp Refills   omeprazole (PRILOSEC) 20 MG capsule [Pharmacy Med Name: Omeprazole 20 MG Oral Capsule Delayed Release] 100 capsule 0    Sig: TAKE 1 CAPSULE BY MOUTH DAILY     Gastroenterology: Proton Pump Inhibitors Passed - 01/06/2023 10:07 PM      Passed - Valid encounter within last 12 months    Recent Outpatient Visits           5 months ago Hypothyroidism, unspecified type   [facility] Storm Frisk, MD   1 year ago Hypothyroidism, unspecified type   [facility] Storm Frisk, MD   1 year ago Paresthesia   [facility] [place], [place], [place]   3 years ago Adenocarcinoma of right lung [facility])   [place] Hoy Register, MD   3 years ago Sepsis, due to unspecified organism, unspecified whether acute organ dysfunction present [facility])   [facility] [place], Marzella Schlein, [place]       Future Appointments             In 2 weeks D[place] Charlcie Cradle, MD [facility]

## 2023-01-16 ENCOUNTER — Ambulatory Visit: Payer: Self-pay

## 2023-01-16 NOTE — Patient Outreach (Signed)
  Care Coordination   Follow Up Visit Note   01/16/2023 Name: Janice Adams MRN: [id number] DOB: Jul 03, 1955  Janice Adams is a 66 y.o. year old female who sees Storm Frisk, MD for primary care. I spoke with  [name] by phone today.  What matters to the patients health and wellness today?  Patient contacted SW to report she was in the hospital on 9/8 due to anxiety over moving. Unable to locate documentation of event in Epic or Patient Ping. Discussed the patient has been unable to get in touch with her caseworker at the [employer] to assist with transferring [place] to [place] to be closer to family.  SW reminded the patient this Clinical research associate is unable to transfer vouchers.    SDOH assessments and interventions completed:  No     Care Coordination Interventions:  Yes, provided   Interventions Today    Flowsheet Row Most Recent Value  Chronic Disease   Chronic disease during today's visit Other  [Anxiety, interest in moving]  General Interventions   General Interventions Discussed/Reviewed Communication with, [employer]  [Discussed the patient would like to move to [place] and would like section 8 to transfer. Pt unable to contact caseworker for this request]  Communication with Social Work  [employer] with LCSW who is working with the patient to update on todays call]        Follow up plan: No further intervention required. LCSW will plan to follow up with the housing authority to request feedback on next steps for the patient to transfer vouchers to desired state.    Encounter Outcome:  Patient Visit Completed   Bevelyn Ngo, Kenard Gower, CDP Social Worker, Certified Dementia Practitioner [facility] Care Management  Care Coordination [phone]

## 2023-01-16 NOTE — Patient Instructions (Signed)
Visit Information  Thank you for taking time to visit with me today. Please don't hesitate to contact me if I can be of assistance to you.   Following are the goals we discussed today:  - Work with LCSW to Geneticist, molecular   If you are experiencing a Mental Health or Behavioral Health Crisis or need someone to talk to, please go to [facility] [address], [place] [phone]) call 911  The patient verbalized understanding of instructions, educational materials, and care plan provided today and DECLINED offer to receive copy of patient instructions, educational materials, and care plan.   Bevelyn Ngo, BSW, CDP Social Worker, Certified Dementia Practitioner [facility] Care Management  Care Coordination [phone]

## 2023-01-17 ENCOUNTER — Telehealth: Payer: Self-pay | Admitting: Licensed Clinical Social Worker

## 2023-01-17 ENCOUNTER — Encounter: Payer: Self-pay | Admitting: Licensed Clinical Social Worker

## 2023-01-17 NOTE — Patient Outreach (Signed)
  Care Coordination   Follow Up Visit Note   01/17/2023 Name: Janice Adams MRN: [id number] DOB: 03/02/1956  Kiyanne Om is a 67 y.o. year old female who sees Storm Frisk, MD for primary care. I  engaged with [employer]  What matters to the patients health and wellness today?  Housing   SDOH assessments and interventions completed:  No     Care Coordination Interventions:  Yes, provided   Follow up plan: LCSW will follow up with patient and [facility]   Encounter Outcome:  Patient Visit Completed   Jenel Lucks, MSW, LCSW [facility] Care Management [facility]  [facility] [email or web address] Phone [phone] 10:52 AM

## 2023-01-18 ENCOUNTER — Encounter: Payer: Self-pay | Admitting: Licensed Clinical Social Worker

## 2023-01-18 NOTE — Patient Outreach (Signed)
  Care Coordination   Multidisciplinary Case Review Note    01/18/2023 Name: Janice Adams MRN: [id number] DOB: 1956-01-10  Janice Adams is a 67 y.o. year old female who sees Janice Frisk, MD for primary care.  The  multidisciplinary care team met today to review patient care needs and barriers.    Care Coordination Interventions: Multidisciplinary case discussion to review patient ongoing care coordination needs  Patient to be engaged ongoing by LCSW Janice Adams to address disease management and social support needs   SDOH assessments and interventions completed:  No     Care Coordination Interventions Activated:  Yes   Care Coordination Interventions:  Yes, provided  Interventions Today    Flowsheet Row Most Recent Value  General Interventions   General Interventions Discussed/Reviewed Communication with  Communication with RN, Social Work  [employer] collaborated with pod to discuss patient care needs and barriers.]       Follow up plan: Follow up call scheduled for 1-2 weeks    Multidisciplinary Team Attendees:   Janice Adams, BSW Janice Lucks, LCSW Janice Adams, [facility] RNCM Janice Adams, [facility]  Scribe for Multidisciplinary Case Review:   Janice Adams, MSW, LCSW [facility] Care Management [facility]  [facility] [email or web address] Phone [phone] 10:52 AM

## 2023-01-24 ENCOUNTER — Other Ambulatory Visit: Payer: Self-pay | Admitting: Critical Care Medicine

## 2023-01-24 DIAGNOSIS — R202 Paresthesia of skin: Secondary | ICD-10-CM

## 2023-01-25 NOTE — Telephone Encounter (Signed)
Requested medication (s) are due for refill today: No  Requested medication (s) are on the active medication list: No  Last refill:    Future visit scheduled: Yes   Notes to clinic:  D/C 07/09/22.    Requested Prescriptions  Pending Prescriptions Disp Refills   gabapentin (NEURONTIN) 300 MG capsule [Pharmacy Med Name: GABAPENTIN 300MG  CAPSULES] 90 capsule 1    Sig: TAKE 1 CAPSULE(300 MG) BY MOUTH AT BEDTIME     Neurology: Anticonvulsants - gabapentin Failed - 01/24/2023 10:12 AM      Failed - Cr in normal range and within 360 days    Creatinine  Date Value Ref Range Status  07/31/2020 1.14 (H) 0.44 - 1.00 mg/dL Final   Creat  Date Value Ref Range Status  05/11/2016 0.85 0.50 - 0.99 mg/dL Final    Comment:      For patients > or = 68 years of age: The upper reference limit for Creatinine is approximately 13% higher for people identified as African-American.      Creatinine, Ser  Date Value Ref Range Status  07/11/2022 1.10 (H) 0.44 - 1.00 mg/dL Final         Passed - Completed PHQ-2 or PHQ-9 in the last 360 days      Passed - Valid encounter within last 12 months    Recent Outpatient Visits           6 months ago Hypothyroidism, unspecified type   [facility] Storm Frisk, MD   1 year ago Hypothyroidism, unspecified type   [facility] Storm Frisk, MD   1 year ago Paresthesia   [facility] [place], [place], [place]   3 years ago Adenocarcinoma of right lung [facility])   [place] [facility] Hoy Register, MD   3 years ago Sepsis, due to unspecified organism, unspecified whether acute organ dysfunction present [facility])    [facility] [place], Marzella Schlein, [place]       Future Appointments             Tomorrow Delford Field, Charlcie Cradle, MD [facility]

## 2023-01-26 ENCOUNTER — Ambulatory Visit: Payer: 59 | Admitting: Critical Care Medicine

## 2023-01-26 NOTE — Progress Notes (Deleted)
Established Patient Office Visit  Subjective   Patient ID: Janice Adams, female    DOB: 02-24-1956  Age: 67 y.o. MRN: [id number]  No chief complaint on file.   67 y.o.F not seen since 03/2021 by Delford Field Patient's had a recent admission earlier this month for abdominal pain.  Note she has a prior history of lung cancer status post resection reflux disease hypertension.  She was admitted with volume depletion abdominal pain got IV fluids CT scan of the abdomen unremarkable.  She was thought to have a urinary tract infection however urine culture was negative.  She has hypothyroidism and has significant elevations in TSH has not taken her Synthroid.  She has not had a colonoscopy and needs follow-up gastroenterology also to assess her upper tract.  Below is a copy of the discharge summary.  Admit date: 07/09/2022 Discharge date: 07/11/2022   Time spent: 35 minutes.   Recommendations for Outpatient Follow-up:  1. Follow-up with your primary care provider. 2. Obtain GI referral from your PCP. 3. Take your medications as prescribed.   Discharge Diagnoses:  Active Hospital Problems   Diagnosis Date Noted  Abdominal pain 07/09/2022  Volume depletion 07/10/2022  Hypercalcemia 07/09/2022  Hypothyroidism 04/06/2021  UTI (urinary tract infection) 08/19/2019  GERD (gastroesophageal reflux disease) 11/08/2012   Resolved Hospital Problems No resolved problems to display.     Discharge Condition: Stable   Diet recommendation: Resume previous diet.   Vitals:   07/10/22 2030 07/11/22 0504 BP: 122/86 122/87 Pulse: 91 94 Resp: 18 18 Temp: 97.9 F (36.6 C) 97.8 F (36.6 C) SpO2: 91% 95%     History of present illness:  Patient is a 67 year old African-American female with past medical history significant for lung cancer s/p resection, GERD and hypertension.  Patient was admitted with 4-day history of abdominal pain.  Volume depleted on presentation.  Received IV fluid and IV PPI  with improvement of her symptomatology.  Urinalysis revealed many bacteria, asymptomatic.  Urine culture in process.  Her abdominal pain resolved.  She was able to tolerate a diet.  No recurrent abdominal pain, no nausea or vomiting.   07/11/2022: The patient was seen and examined at bedside.  There were no acute events overnight.  She ate her breakfast without any difficulty.  She has no new complaints and is eager to go home.  Advised to take her medications as prescribed.  The patient was receptive.   Hospital Course:  Principal Problem:   Abdominal pain Active Problems:   GERD (gastroesophageal reflux disease)   UTI (urinary tract infection)   Hypothyroidism   Hypercalcemia   Volume depletion   Resolved, abdominal pain: -History of GERD.   -Patient was off her home PPI for few days, resume. Received IV Protonix.   -Epigastric pain has resolved.   -Continue home PPI  Follow-up with GI primary care provider, obtain GI referral from your PCP.   GERD (gastroesophageal reflux disease): Received IV PPI. -Follow-up with GI on discharge.  Obtain GI referral from your PCP. Resume home omeprazole, 20 mg daily.   Asymptomatic bacteriuria:  -Urine culture, in process.    Resolved, volume depletion: Received IV fluids Euvolemic on exam   Hypothyroidism: Continue home Synthroid. TSH 225, made the patient aware, she stated she was not taking it regularly.  Stated she had plenty of the medication and did not need a refill. Repeat TSH and obtain free T4 level outpatient in 4-6 weeks. Advised on the importance of medication compliance.  The patient  was receptive. Take your medications as prescribed Follow-up with your primary care provider   Medication noncompliance Noncompliant with home thyroid medication Ran out of home omeprazole, refilled, 20 mg daily x 90 days. Advised on the importance of medication compliance, the patient was receptive.   Resolved, hypercalcemia: Suspect  related to dehydration.    DVT prophylaxis: Subcutaneous Lovenox daily. Code Status: Full code. Family Communication:  Disposition Plan: Home eventually.    Note during the last admission she had a CT of the chest showing no recurrence of lung cancer.  It appears that oncology has signed off.  She does have elevation in calcium needs to be followed up.  Note during hospitalization MCV was markedly increased  9/19       Review of Systems  Constitutional:  Negative for chills, diaphoresis, fever, malaise/fatigue and weight loss.  HENT:  Negative for congestion, hearing loss, nosebleeds, sore throat and tinnitus.   Eyes:  Negative for blurred vision, photophobia and redness.  Respiratory:  Negative for cough, hemoptysis, sputum production, shortness of breath, wheezing and stridor.   Cardiovascular:  Negative for chest pain, palpitations, orthopnea, claudication, leg swelling and PND.  Gastrointestinal:  Positive for heartburn. Negative for abdominal pain, blood in stool, constipation, diarrhea, nausea and vomiting.  Genitourinary:  Negative for dysuria, flank pain, frequency, hematuria and urgency.  Musculoskeletal:  Negative for back pain, falls, joint pain, myalgias and neck pain.  Skin:  Negative for itching and rash.  Neurological:  Negative for dizziness, tingling, tremors, sensory change, speech change, focal weakness, seizures, loss of consciousness, weakness and headaches.  Endo/Heme/Allergies:  Negative for environmental allergies and polydipsia. Does not bruise/bleed easily.  Psychiatric/Behavioral:  Negative for depression, memory loss, substance abuse and suicidal ideas. The patient is not nervous/anxious and does not have insomnia.       Objective:     LMP  (LMP Unknown)    Physical Exam Vitals reviewed.  Constitutional:      Appearance: Normal appearance. She is well-developed. She is obese. She is not diaphoretic.  HENT:     Head: Normocephalic and  atraumatic.     Nose: Nose normal. No nasal deformity, septal deviation, mucosal edema or rhinorrhea.     Right Sinus: No maxillary sinus tenderness or frontal sinus tenderness.     Left Sinus: No maxillary sinus tenderness or frontal sinus tenderness.     Mouth/Throat:     Mouth: Mucous membranes are moist.     Pharynx: Oropharynx is clear. No oropharyngeal exudate.  Eyes:     General: No scleral icterus.    Conjunctiva/sclera: Conjunctivae normal.     Pupils: Pupils are equal, round, and reactive to light.  Neck:     Thyroid: No thyromegaly.     Vascular: No carotid bruit or JVD.     Trachea: Trachea normal. No tracheal tenderness or tracheal deviation.  Cardiovascular:     Rate and Rhythm: Normal rate and regular rhythm.     Chest Wall: PMI is not displaced.     Pulses: Normal pulses. No decreased pulses.     Heart sounds: Normal heart sounds, S1 normal and S2 normal. Heart sounds not distant. No murmur heard.    No systolic murmur is present.     No diastolic murmur is present.     No friction rub. No gallop. No S3 or S4 sounds.  Pulmonary:     Effort: Pulmonary effort is normal. No tachypnea, accessory muscle usage or respiratory distress.  Breath sounds: No stridor. Rhonchi present. No decreased breath sounds, wheezing or rales.     Comments: Rhonchorous breath sounds right upper lobe Chest:     Chest wall: No tenderness.  Abdominal:     General: Bowel sounds are normal. There is no distension.     Palpations: Abdomen is soft. Abdomen is not rigid.     Tenderness: There is no abdominal tenderness. There is no guarding or rebound.  Musculoskeletal:        General: Normal range of motion.     Cervical back: Normal range of motion and neck supple. No edema, erythema or rigidity. No muscular tenderness. Normal range of motion.  Lymphadenopathy:     Head:     Right side of head: No submental or submandibular adenopathy.     Left side of head: No submental or submandibular  adenopathy.     Cervical: No cervical adenopathy.  Skin:    General: Skin is warm and dry.     Coloration: Skin is not pale.     Findings: No rash.     Nails: There is no clubbing.  Neurological:     Mental Status: She is alert and oriented to person, place, and time.     Sensory: No sensory deficit.  Psychiatric:        Mood and Affect: Mood normal.        Speech: Speech normal.        Behavior: Behavior normal.        Thought Content: Thought content normal.        Judgment: Judgment normal.      No results found for any visits on 01/26/23.    The ASCVD Risk score (Arnett DK, et al., 2019) failed to calculate for the following reasons:   The systolic blood pressure is missing    Assessment & Plan:   Problem List Items Addressed This Visit   None  Mammogram ordered Multiple systems assessed complex decision making high time spent 40 minutes No follow-ups on file.    Shan Levans, MD

## 2023-01-30 ENCOUNTER — Other Ambulatory Visit: Payer: Self-pay

## 2023-01-30 ENCOUNTER — Ambulatory Visit (HOSPITAL_COMMUNITY)
Admission: EM | Admit: 2023-01-30 | Discharge: 2023-01-30 | Disposition: A | Discharge status: Home / Self Care | Payer: 59 | Attending: Family | Admitting: Family

## 2023-01-30 ENCOUNTER — Ambulatory Visit: Payer: Self-pay | Admitting: *Deleted

## 2023-01-30 ENCOUNTER — Encounter (HOSPITAL_COMMUNITY): Payer: Self-pay

## 2023-01-30 DIAGNOSIS — R112 Nausea with vomiting, unspecified: Secondary | ICD-10-CM

## 2023-01-30 DIAGNOSIS — R1013 Epigastric pain: Secondary | ICD-10-CM | POA: Diagnosis not present

## 2023-01-30 DIAGNOSIS — E039 Hypothyroidism, unspecified: Secondary | ICD-10-CM

## 2023-01-30 DIAGNOSIS — I10 Essential (primary) hypertension: Secondary | ICD-10-CM

## 2023-01-30 MED ORDER — ONDANSETRON HCL 8 MG PO TABS: 8.0000 mg | ORAL_TABLET | Freq: Three times a day (TID) | ORAL | 0 refills | Status: DC | PRN

## 2023-01-30 MED ORDER — LEVOTHYROXINE SODIUM 150 MCG PO TABS: 150.0000 ug | ORAL_TABLET | Freq: Every day | ORAL | 0 refills | Status: DC

## 2023-01-30 MED ORDER — SUCRALFATE 1 GM/10ML PO SUSP: 1.0000 g | Freq: Three times a day (TID) | ORAL | 0 refills | Status: DC | PRN

## 2023-01-30 NOTE — Telephone Encounter (Addendum)
Levothyroxine (SYNTHROID) 150 MCG tablet sent to patient pharmacy [facility].   Noted  No OV today  agree with disposition as given for patient abdominal pain

## 2023-01-30 NOTE — ED Provider Notes (Signed)
[facility]    CSN: [id number] Arrival date & time: 01/30/23  1033      History   Chief Complaint Chief Complaint  Patient presents with   Abdominal Pain    HPI Adan Deckard is a 67 y.o. female.   67 year old female presents with central to upper abdominal pain for the past 4 days. Started with some intermittent abdominal pain a few days ago. Had noticed decreased appetite but still able to eat a variety of foods. Pain got worse yesterday with some nausea and vomiting. Last episode of emesis was about 1 hour ago. Denies any fever, headache, chest pain, difficulty urinating or unusual vaginal discharge. Did have a small bowel movement this morning. Has a history of GERD and currently on Prilosec daily.  Feels this abdominal pain is related to her GERD. She indicates the pain has been decreasing over the past few hours.  Other chronic health issues include thyroid disorder which is controlled with Synthroid daily.  Also has peripheral neuropathy but has not started Neurontin yet.  Other conditions include HTN but is not on medication, and lung cancer which she has had treatment and is now under observational care.   The history is provided by the patient.    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) 06/12/2019   GERD (gastroesophageal reflux disease) 11/08/2012   Former smoker 11/08/2012    Past  Surgical History:  Procedure Laterality Date   INTERCOSTAL NERVE BLOCK Right 12/30/2019   Procedure: INTERCOSTAL NERVE BLOCK;  Surgeon: Loreli Slot, MD;  Location: [facility] OR;  Service: Thoracic;  Laterality: Right;   LYMPH NODE DISSECTION Right 12/30/2019   Procedure: LYMPH NODE DISSECTION;  Surgeon: Loreli Slot, MD;  Location: [facility] OR;  Service: Thoracic;  Laterality: Right;   MULTIPLE TOOTH EXTRACTIONS     TONSILLECTOMY     uterine ablation     VIDEO BRONCHOSCOPY N/A 12/30/2019   Procedure: VIDEO BRONCHOSCOPY;  Surgeon: Loreli Slot, MD;  Location: [facility] OR;  Service: Thoracic;  Laterality: N/A;   VIDEO BRONCHOSCOPY WITH ENDOBRONCHIAL ULTRASOUND N/A 06/04/2019   Procedure: VIDEO BRONCHOSCOPY WITH ENDOBRONCHIAL ULTRASOUND;  Surgeon: Luciano Cutter, MD;  Location: [facility] OR;  Service: Thoracic;  Laterality: N/A;   WISDOM TOOTH EXTRACTION      OB History   No obstetric history on file.      Home Medications    Prior to Admission medications   Medication Sig Start Date End Date Taking? Authorizing Provider  levothyroxine (SYNTHROID) 150 MCG tablet Take 1 tablet (150 mcg total) by mouth daily before breakfast. 01/30/23  Yes Storm Frisk, MD  ondansetron (ZOFRAN) 8 MG tablet Take 1 tablet (8 mg total) by mouth every 8 (eight) hours as needed for nausea or vomiting. 01/30/23  Yes Troy Hartzog, Ali Lowe, NP  sucralfate (  CARAFATE) 1 GM/10ML suspension Take 10 mLs (1 g total) by mouth 3 (three) times daily as needed (before meals). 01/30/23  Yes Laneshia Pina, Ali Lowe, NP  gabapentin (NEURONTIN) 300 MG capsule TAKE 1 CAPSULE(300 MG) BY MOUTH AT BEDTIME 01/26/23   Storm Frisk, MD  Multiple Vitamin (MULTIVITAMIN WITH MINERALS) TABS tablet Take 1 tablet by mouth daily.    [provider]  omeprazole (PRILOSEC) 20 MG capsule TAKE 1 CAPSULE BY MOUTH DAILY 01/10/23   Storm Frisk, MD  Vitamin D, Ergocalciferol, (DRISDOL) 1.25 MG (50000 UNIT) CAPS capsule Take 1 capsule (50,000  Units total) by mouth every 7 (seven) days. 09/29/22   Storm Frisk, MD    Family History Family History  Problem Relation Age of Onset   Breast cancer Mother    Cancer Mother    Seizures Father    Breast cancer Maternal Grandmother    Breast cancer Daughter    Cancer Daughter    Heart attack Brother     Social History Social History   Tobacco Use   Smoking status: Former    Current packs/day: 0.00    Average packs/day: 0.5 packs/day for 47.0 years (23.5 ttl pk-yrs)    Types: Cigarettes    Start date: 07/06/1972    Quit date: 07/07/2019    Years since quitting: 3.5   Smokeless tobacco: Never  Vaping Use   Vaping status: Never Used  Substance Use Topics   Alcohol use: No    Alcohol/week: 0.0 standard drinks of alcohol   Drug use: No    Comment: last use Wed 05/28/19     Allergies   Ivp dye [iodinated contrast media]   Review of Systems Review of Systems  Constitutional:  Positive for appetite change and fatigue. Negative for chills, diaphoresis and fever.  HENT:  Negative for mouth sores, sore throat and trouble swallowing.   Respiratory:  Negative for cough and chest tightness.   Cardiovascular:  Negative for chest pain.  Gastrointestinal:  Positive for abdominal pain, nausea and vomiting. Negative for blood in stool and diarrhea.  Genitourinary:  Negative for decreased urine volume, difficulty urinating, flank pain, frequency, hematuria and vaginal discharge.  Musculoskeletal:  Positive for arthralgias.  Skin:  Negative for color change and rash.  Allergic/Immunologic: Negative for environmental allergies and food allergies.  Neurological:  Positive for numbness (in fingers). Negative for dizziness, tremors, seizures, syncope, speech difficulty and headaches.  Hematological:  Negative for adenopathy. Does not bruise/bleed easily.     Physical Exam Triage Vital Signs ED Triage Vitals  Encounter Vitals Group     BP 01/30/23 1155 (!) 149/95     Systolic BP  Percentile --      Diastolic BP Percentile --      Pulse Rate 01/30/23 1155 82     Resp 01/30/23 1155 18     Temp 01/30/23 1155 97.8 F (36.6 C)     Temp Source 01/30/23 1155 Oral     SpO2 01/30/23 1155 94 %     Weight 01/30/23 1154 174 lb (78.9 kg)     Height --      Head Circumference --      Peak Flow --      Pain Score 01/30/23 1154 5     Pain Loc --      Pain Education --      Exclude from Growth Chart --    No data found.  Updated Vital Signs BP (!) 149/95 (BP Location: Left  Arm)   Pulse 82   Temp 97.8 F (36.6 C) (Oral)   Resp 18   Wt 174 lb (78.9 kg)   LMP  (LMP Unknown)   SpO2 94%   BMI 29.87 kg/m   Visual Acuity Right Eye Distance:   Left Eye Distance:   Bilateral Distance:    Right Eye Near:   Left Eye Near:    Bilateral Near:     Physical Exam Vitals and nursing note reviewed.  Constitutional:      General: She is awake. She is not in acute distress.    Appearance: She is well-developed and well-groomed.     Comments: She is sitting on the exam table in no acute distress but appears uncomfortable due to intermittent abdominal pain.   HENT:     Head: Normocephalic and atraumatic.     Right Ear: Hearing normal.     Left Ear: Hearing normal.  Eyes:     Extraocular Movements: Extraocular movements intact.     Conjunctiva/sclera: Conjunctivae normal.  Cardiovascular:     Rate and Rhythm: Normal rate and regular rhythm.     Heart sounds: Normal heart sounds. No murmur heard. Pulmonary:     Effort: Pulmonary effort is normal. No accessory muscle usage or respiratory distress.     Breath sounds: Normal air entry. No decreased air movement. Examination of the right-upper field reveals wheezing. Examination of the left-upper field reveals wheezing. Examination of the right-lower field reveals wheezing. Examination of the left-lower field reveals wheezing. Wheezing present. No decreased breath sounds, rhonchi or rales.  Abdominal:     General: Bowel sounds  are normal. There is no distension or abdominal bruit.     Palpations: Abdomen is soft. There is no hepatomegaly, splenomegaly or mass.     Tenderness: There is generalized abdominal tenderness and tenderness in the right upper quadrant, epigastric area and left upper quadrant. There is no right CVA tenderness, left CVA tenderness, guarding or rebound. Negative signs include Murphy's sign and Rovsing's sign.    Skin:    General: Skin is warm and dry.     Capillary Refill: Capillary refill takes less than 2 seconds.     Findings: No rash.  Neurological:     General: No focal deficit present.     Mental Status: She is alert and oriented to person, place, and time.  Psychiatric:        Mood and Affect: Mood normal.        Behavior: Behavior normal. Behavior is cooperative.        Thought Content: Thought content normal.        Judgment: Judgment normal.      UC Treatments / Results  Labs (all labs ordered are listed, but only abnormal results are displayed) Labs Reviewed - No data to display  EKG   Radiology No results found.  Procedures Procedures (including critical care time)  Medications Ordered in UC Medications - No data to display  Initial Impression / Assessment and Plan / UC Course  I have reviewed the triage vital signs and the nursing notes.  Pertinent labs & imaging results that were available during my care of the patient were reviewed by me and considered in my medical decision making (see chart for details).     Reviewed with patient various causes of abdominal pain. Since these current symptoms are similar to previous flare-up of GERD symptoms, will manage with symptom care. Patient is stable with no concerning exam findings.  May start Zofran 8mg  every 8 hours as needed for nausea. Continue Prilosec daily as prescribed. May take Carafate 1g before meals 3 times a day and may also take a dose before bedtime if needed. Continue to eat small, frequent meals.  Avoid acidic and spicy foods. No fried foods. Continue to push fluids and water. If pain does not improve or gets worse or unable to keep down any fluids over the next 24 hours, go to the ER ASAP. Otherwise, follow-up with her PCP in 5 to 7 days for recheck if pain does not resolve.   Final Clinical Impressions(s) / UC Diagnoses   Final diagnoses:  Epigastric pain  Nausea and vomiting, unspecified vomiting type  Elevated blood pressure reading in office with diagnosis of hypertension     Discharge Instructions      Recommend start Zofran 8mg  every 8 hours as needed for nausea. May also take Sucralfate liquid 3 times a day before meals as needed. May also take a dose before bedtime. Continue Prilosec as directed. If pain does not improve or gets worse or unable to keep any fluids down over the next 24 hours, go to the ER ASAP. Otherwise, follow-up with your PCP in 5 to 7 days for recheck if pain does not resolve.     ED Prescriptions     Medication Sig Dispense Auth. Provider   ondansetron (ZOFRAN) 8 MG tablet Take 1 tablet (8 mg total) by mouth every 8 (eight) hours as needed for nausea or vomiting. 15 tablet Jayleen Afonso, Ali Lowe, NP   sucralfate (CARAFATE) 1 GM/10ML suspension Take 10 mLs (1 g total) by mouth 3 (three) times daily as needed (before meals). 414 mL Sudie Grumbling, NP      PDMP not reviewed this encounter.   Sudie Grumbling, NP 01/31/23 [phone]

## 2023-01-30 NOTE — Discharge Instructions (Signed)
Recommend start Zofran 8mg  every 8 hours as needed for nausea. May also take Sucralfate liquid 3 times a day before meals as needed. May also take a dose before bedtime. Continue Prilosec as directed. If pain does not improve or gets worse or unable to keep any fluids down over the next 24 hours, go to the ER ASAP. Otherwise, follow-up with your PCP in 5 to 7 days for recheck if pain does not resolve.

## 2023-01-30 NOTE — ED Triage Notes (Signed)
Stomach pain x 4 days. Last BM was this morning. No normal. Loss of appetite

## 2023-01-30 NOTE — Telephone Encounter (Signed)
  Chief Complaint: tingling fingers and toes , abdominal pain Symptoms: requesting medication for fingers and toes. Possible Neurontin. Middle abdominal pain worsening , patient crying in pain and pain comes and goes. LBM yesterday . Vomiting a few times . "Nothing but water". Frequency: yesterday  Pertinent Negatives: Patient denies fever. No diarrhea no chest pain reported  Disposition: [x] ED /[] Urgent Care (no appt availability in office) / [] Appointment(In office/virtual)/ []  [place] [facility]/ [] Home Care/ [] Refused Recommended Disposition /[] [facility]/ []  Follow-up with PCP Additional Notes:   Patient requesting refill for Neurontin and review of chart shows refill 01/26/23 at [facility]. Patient also requesting thyroid medication refill to go to [facility] due to she has been out x 3-4 days ago  and requesting refills. Patient reports she has been told she needs to pay for medication but has never paid for before and does not have money .Patient continued to report abdominal pain . Recommended ED due to abdominal pain and crying. Please advise regarding medication .     Reason for Disposition  Patient sounds very sick or weak to the triager  Answer Assessment - Initial Assessment Questions 1. LOCATION: "Where does it hurt?"      Middle of abdominal  2. RADIATION: "Does the pain shoot anywhere else?" (e.g., chest, back)     na 3. ONSET: "When did the pain begin?" (e.g., minutes, hours or days ago)      Last night  4. SUDDEN: "Gradual or sudden onset?"     na 5. PATTERN "Does the pain come and go, or is it constant?"    - If it comes and goes: "How long does it last?" "Do you have pain now?"     (Note: Comes and goes means the pain is intermittent. It goes away completely between bouts.)    - If constant: "Is it getting better, staying the same, or getting worse?"      (Note: Constant means the pain never goes away completely; most serious pain is constant and gets  worse.)      Comes and goes  6. SEVERITY: "How bad is the pain?"  (e.g., Scale 1-10; mild, moderate, or severe)    - MILD (1-3): Doesn't interfere with normal activities, abdomen soft and not tender to touch.     - MODERATE (4-7): Interferes with normal activities or awakens from sleep, abdomen tender to touch.     - SEVERE (8-10): Excruciating pain, doubled over, unable to do any normal activities.       Crying in pain 7. RECURRENT SYMPTOM: "Have you ever had this type of stomach pain before?" If Yes, ask: "When was the last time?" and "What happened that time?"      Yes  8. CAUSE: "What do you think is causing the stomach pain?"     Not sure  9. RELIEVING/AGGRAVATING FACTORS: "What makes it better or worse?" (e.g., antacids, bending or twisting motion, bowel movement)     na 10. OTHER SYMPTOMS: "Do you have any other symptoms?" (e.g., back pain, diarrhea, fever, urination pain, vomiting)       Middle abdominal pain vomiting a few times.  11. PREGNANCY: "Is there any chance you are pregnant?" "When was your last menstrual period?"       na  Protocols used: Abdominal Pain - [facility]

## 2023-02-09 ENCOUNTER — Emergency Department (HOSPITAL_BASED_OUTPATIENT_CLINIC_OR_DEPARTMENT_OTHER)
Admission: EM | Admit: 2023-02-09 | Discharge: 2023-02-09 | Disposition: A | Discharge status: Home / Self Care | Payer: 59 | Attending: Emergency Medicine | Admitting: Emergency Medicine

## 2023-02-09 ENCOUNTER — Encounter (HOSPITAL_BASED_OUTPATIENT_CLINIC_OR_DEPARTMENT_OTHER): Payer: Self-pay

## 2023-02-09 DIAGNOSIS — I10 Essential (primary) hypertension: Secondary | ICD-10-CM | POA: Diagnosis not present

## 2023-02-09 DIAGNOSIS — Z85118 Personal history of other malignant neoplasm of bronchus and lung: Secondary | ICD-10-CM | POA: Diagnosis not present

## 2023-02-09 DIAGNOSIS — R062 Wheezing: Secondary | ICD-10-CM | POA: Diagnosis not present

## 2023-02-09 DIAGNOSIS — R109 Unspecified abdominal pain: Secondary | ICD-10-CM | POA: Diagnosis not present

## 2023-02-09 DIAGNOSIS — R1110 Vomiting, unspecified: Secondary | ICD-10-CM | POA: Diagnosis not present

## 2023-02-09 DIAGNOSIS — R1030 Lower abdominal pain, unspecified: Secondary | ICD-10-CM | POA: Diagnosis not present

## 2023-02-09 DIAGNOSIS — R112 Nausea with vomiting, unspecified: Secondary | ICD-10-CM | POA: Diagnosis not present

## 2023-02-09 LAB — COMPREHENSIVE METABOLIC PANEL
ALT: 5 U/L (ref 0–44)
AST: 10 U/L — ABNORMAL LOW (ref 15–41)
Albumin: 4 g/dL (ref 3.5–5.0)
Alkaline Phosphatase: 56 U/L (ref 38–126)
Anion gap: 9 (ref 5–15)
BUN: 11 mg/dL (ref 8–23)
CO2: 27 mmol/L (ref 22–32)
Calcium: 9.4 mg/dL (ref 8.9–10.3)
Chloride: 103 mmol/L (ref 98–111)
Creatinine, Ser: 0.8 mg/dL (ref 0.44–1.00)
GFR, Estimated: >60 mL/min (ref >60)
Glucose, Bld: 111 mg/dL — ABNORMAL HIGH (ref 70–99)
Potassium: 3.8 mmol/L (ref 3.5–5.1)
Sodium: 139 mmol/L (ref 135–145)
Total Bilirubin: 0.6 mg/dL (ref 0.3–1.2)
Total Protein: 7.3 g/dL (ref 6.5–8.1)

## 2023-02-09 LAB — URINALYSIS, ROUTINE W REFLEX MICROSCOPIC
Bilirubin Urine: NEGATIVE
Glucose, UA: NEGATIVE mg/dL
Ketones, ur: NEGATIVE mg/dL
Leukocytes,Ua: NEGATIVE
Nitrite: NEGATIVE
Specific Gravity, Urine: 1.016 (ref 1.005–1.030)
pH: 6 (ref 5.0–8.0)

## 2023-02-09 LAB — CBC
HCT: 37.1 % (ref 36.0–46.0)
Hemoglobin: 12.8 g/dL (ref 12.0–15.0)
MCH: 34.1 pg — ABNORMAL HIGH (ref 26.0–34.0)
MCHC: 34.5 g/dL (ref 30.0–36.0)
MCV: 98.9 fL (ref 80.0–100.0)
Platelets: 289 10*3/uL (ref 150–400)
RBC: 3.75 MIL/uL — ABNORMAL LOW (ref 3.87–5.11)
RDW: 14.5 % (ref 11.5–15.5)
WBC: 4.1 10*3/uL (ref 4.0–10.5)
nRBC: 0 % (ref 0.0–0.2)

## 2023-02-09 LAB — LIPASE, BLOOD: Lipase: 28 U/L (ref 11–51)

## 2023-02-09 MED ORDER — MORPHINE SULFATE (PF) 4 MG/ML IV SOLN
4.0000 mg | Freq: Once | INTRAVENOUS | Status: AC
  Administered 2023-02-09: 4 mg via INTRAVENOUS
  Filled 2023-02-09: qty 1

## 2023-02-09 MED ORDER — ONDANSETRON 4 MG PO TBDP: 4.0000 mg | ORAL_TABLET | Freq: Three times a day (TID) | ORAL | 0 refills | Status: DC | PRN

## 2023-02-09 MED ORDER — ONDANSETRON HCL 4 MG/2ML IJ SOLN
4.0000 mg | Freq: Once | INTRAMUSCULAR | Status: AC
  Administered 2023-02-09: 4 mg via INTRAVENOUS
  Filled 2023-02-09: qty 2

## 2023-02-09 NOTE — ED Triage Notes (Signed)
Pt states that she has been having lower abd pain for the past few day along with vomiting x 5 today, denies diarrhea or fevers.

## 2023-02-09 NOTE — ED Notes (Signed)
Patient able to tolerate PO fluids

## 2023-02-09 NOTE — ED Notes (Signed)
Pt O2 dropping to 85% after pain meds and falling asleep. 2L [place] applied, O2 improved to 97%. Pt easily arousable to verbal stimuli. Denies further needs at this time. NAD.

## 2023-02-09 NOTE — ED Notes (Signed)
Water and orange juice given to patient for PO challenge.

## 2023-02-09 NOTE — Discharge Instructions (Signed)
You were seen today after having abdominal pain, nausea, and vomiting.  Your workup today is reassuring.  Take Zofran as needed for nausea.  Make sure that you are staying hydrated.

## 2023-02-09 NOTE — ED Provider Notes (Signed)
[place] EMERGENCY DEPARTMENT AT [facility] Provider Note   CSN: [id number] Arrival date & time: 02/09/23  0356     History  Chief Complaint  Patient presents with   Abdominal Pain    Janice Adams is a 67 y.o. female.  HPI    This is a 67 year old female who presents with abdominal pain and vomiting.  Onset of symptoms earlier today.  States she is having hard time sleeping.  Reports dull and sharp mid abdominal pain.  Has had vomiting.  No diarrhea.  No known sick contacts.  No fevers.  Denies any upper respiratory symptoms.  Does report that she had a granddaughter with COVID 1 month ago.  Home Medications Prior to Admission medications   Medication Sig Start Date End Date Taking? Authorizing Provider  ondansetron (ZOFRAN-ODT) 4 MG disintegrating tablet Take 1 tablet (4 mg total) by mouth every 8 (eight) hours as needed. 02/09/23  Yes Elbert Polyakov, Mayer Masker, MD  gabapentin (NEURONTIN) 300 MG capsule TAKE 1 CAPSULE(300 MG) BY MOUTH AT BEDTIME 01/26/23   Storm Frisk, MD  levothyroxine (SYNTHROID) 150 MCG tablet Take 1 tablet (150 mcg total) by mouth daily before breakfast. 01/30/23   Storm Frisk, MD  Multiple Vitamin (MULTIVITAMIN WITH MINERALS) TABS tablet Take 1 tablet by mouth daily.    [provider]  omeprazole (PRILOSEC) 20 MG capsule TAKE 1 CAPSULE BY MOUTH DAILY 01/10/23   Storm Frisk, MD  ondansetron (ZOFRAN) 8 MG tablet Take 1 tablet (8 mg total) by mouth every 8 (eight) hours as needed for nausea or vomiting. 01/30/23   Amyot, Ali Lowe, NP  sucralfate (CARAFATE) 1 GM/10ML suspension Take 10 mLs (1 g total) by mouth 3 (three) times daily as needed (before meals). 01/30/23   Sudie Grumbling, NP  Vitamin D, Ergocalciferol, (DRISDOL) 1.25 MG (50000 UNIT) CAPS capsule Take 1 capsule (50,000 Units total) by mouth every 7 (seven) days. 09/29/22   Storm Frisk, MD      Allergies    Ivp dye [iodinated contrast media]    Review of Systems    Review of Systems  Constitutional:  Negative for fever.  Respiratory:  Negative for shortness of breath.   Cardiovascular:  Negative for chest pain.  Gastrointestinal:  Positive for abdominal pain, nausea and vomiting. Negative for constipation and diarrhea.  All other systems reviewed and are negative.   Physical Exam Updated Vital Signs BP (!) 164/102   Pulse 78   Temp 97.9 F (36.6 C) (Oral)   Resp 16   LMP  (LMP Unknown)   SpO2 97%  Physical Exam Vitals and nursing note reviewed.  Constitutional:      Appearance: She is well-developed. She is obese. She is not ill-appearing.  HENT:     Head: Normocephalic and atraumatic.  Eyes:     Pupils: Pupils are equal, round, and reactive to light.  Cardiovascular:     Rate and Rhythm: Normal rate and regular rhythm.     Heart sounds: Normal heart sounds.  Pulmonary:     Effort: Pulmonary effort is normal. No respiratory distress.     Breath sounds: Wheezing present.  Abdominal:     General: Bowel sounds are normal.     Palpations: Abdomen is soft.     Tenderness: There is no abdominal tenderness. There is no guarding or rebound.  Musculoskeletal:     Cervical back: Neck supple.  Skin:    General: Skin is warm and dry.  Neurological:  General: No focal deficit present.     Mental Status: She is alert and oriented to person, place, and time.     ED Results / Procedures / Treatments   Labs (all labs ordered are listed, but only abnormal results are displayed) Labs Reviewed  COMPREHENSIVE METABOLIC PANEL - Abnormal; Notable for the following components:      Result Value   Glucose, Bld 111 (*)    AST 10 (*)    All other components within normal limits  CBC - Abnormal; Notable for the following components:   RBC 3.75 (*)    MCH 34.1 (*)    All other components within normal limits  URINALYSIS, ROUTINE W REFLEX MICROSCOPIC - Abnormal; Notable for the following components:   Hgb urine dipstick SMALL (*)    Protein,  ur TRACE (*)    Bacteria, UA RARE (*)    All other components within normal limits  LIPASE, BLOOD    EKG None  Radiology No results found.  Procedures Procedures    Medications Ordered in ED Medications  morphine (PF) 4 MG/ML injection 4 mg (4 mg Intravenous Given 02/09/23 0425)  ondansetron (ZOFRAN) injection 4 mg (4 mg Intravenous Given 02/09/23 0425)    ED Course/ Medical Decision Making/ A&P                                 Medical Decision Making Amount and/or Complexity of Data Reviewed Labs: ordered.  Risk Prescription drug management.   This patient presents to the ED for concern of abdominal pain, vomiting, this involves an extensive number of treatment options, and is a complaint that carries with it a high risk of complications and morbidity.  I considered the following differential and admission for this acute, potentially life threatening condition.  The differential diagnosis includes gastritis, gastroenteritis, pancreatitis, cholecystitis, appendicitis, UTI  MDM:    This is a 67 year old female who presents with abdominal pain, nausea, vomiting.  She is overall nontoxic and vital signs are notable for blood pressure of 2 3/116.  She does have a history of high blood pressure.  Abdominal exam is fairly benign.  No distention or peritonitis.  Labs obtained.  Patient given pain and nausea medication.  She did briefly desaturate when given morphine.  Labs are largely unremarkable.  Normal lipase.  Normal LFTs.  No significant leukocytosis.  No evidence of UTI.  Given the benign exam, feel gastritis versus gastroenteritis is the most likely.  After Zofran, patient was able to eat without difficulty and not have any further vomiting.  We discussed supportive measures at home.  (Labs, imaging, consults)  Labs: I Ordered, and personally interpreted labs.  The pertinent results include: CBC, CMP, lipase, urinalysis  Imaging Studies ordered: I ordered imaging studies  including none I independently visualized and interpreted imaging. I agree with the radiologist interpretation  Additional history obtained from chart review.  External records from outside source obtained and reviewed including prior evaluations  Cardiac Monitoring: The patient was maintained on a cardiac monitor.  If on the cardiac monitor, I personally viewed and interpreted the cardiac monitored which showed an underlying rhythm of: Sinus rhythm  Reevaluation: After the interventions noted above, I reevaluated the patient and found that they have :improved  Social Determinants of Health:  lives independently  Disposition: Discharge  Co morbidities that complicate the patient evaluation  Past Medical History:  Diagnosis Date   Back pain  Cancer (HCC)    Lung Cancer   GERD (gastroesophageal reflux disease)    Gout    no current problems per patient 05/31/19   History of epigastric pain    comes and goes   Hypertension    no meds   UTI (urinary tract infection) 08/19/2019     Medicines Meds ordered this encounter  Medications   morphine (PF) 4 MG/ML injection 4 mg   ondansetron (ZOFRAN) injection 4 mg   ondansetron (ZOFRAN-ODT) 4 MG disintegrating tablet    Sig: Take 1 tablet (4 mg total) by mouth every 8 (eight) hours as needed.    Dispense:  20 tablet    Refill:  0    I have reviewed the patients home medicines and have made adjustments as needed  Problem List / ED Course: Problem List Items Addressed This Visit   None Visit Diagnoses     Abdominal pain with vomiting    -  Primary                   Final Clinical Impression(s) / ED Diagnoses Final diagnoses:  Abdominal pain with vomiting    Rx / DC Orders ED Discharge Orders          Ordered    ondansetron (ZOFRAN-ODT) 4 MG disintegrating tablet  Every 8 hours PRN        02/09/23 0620              Shon Baton, MD 02/09/23 [id number]

## 2023-02-12 ENCOUNTER — Other Ambulatory Visit: Payer: Self-pay

## 2023-02-12 ENCOUNTER — Emergency Department (HOSPITAL_COMMUNITY)
Admission: EM | Admit: 2023-02-12 | Discharge: 2023-02-12 | Disposition: A | Discharge status: Home / Self Care | Payer: 59 | Attending: Emergency Medicine | Admitting: Emergency Medicine

## 2023-02-12 ENCOUNTER — Encounter (HOSPITAL_COMMUNITY): Payer: Self-pay | Admitting: Emergency Medicine

## 2023-02-12 ENCOUNTER — Emergency Department (HOSPITAL_COMMUNITY): Payer: 59

## 2023-02-12 DIAGNOSIS — D259 Leiomyoma of uterus, unspecified: Secondary | ICD-10-CM | POA: Insufficient documentation

## 2023-02-12 DIAGNOSIS — R935 Abnormal findings on diagnostic imaging of other abdominal regions, including retroperitoneum: Secondary | ICD-10-CM

## 2023-02-12 DIAGNOSIS — R109 Unspecified abdominal pain: Secondary | ICD-10-CM

## 2023-02-12 DIAGNOSIS — C3490 Malignant neoplasm of unspecified part of unspecified bronchus or lung: Secondary | ICD-10-CM | POA: Diagnosis not present

## 2023-02-12 DIAGNOSIS — K5730 Diverticulosis of large intestine without perforation or abscess without bleeding: Secondary | ICD-10-CM | POA: Diagnosis not present

## 2023-02-12 DIAGNOSIS — K769 Liver disease, unspecified: Secondary | ICD-10-CM | POA: Diagnosis not present

## 2023-02-12 DIAGNOSIS — R1084 Generalized abdominal pain: Secondary | ICD-10-CM | POA: Insufficient documentation

## 2023-02-12 LAB — CBC
HCT: 38.9 % (ref 36.0–46.0)
Hemoglobin: 13 g/dL (ref 12.0–15.0)
MCH: 34.4 pg — ABNORMAL HIGH (ref 26.0–34.0)
MCHC: 33.4 g/dL (ref 30.0–36.0)
MCV: 102.9 fL — ABNORMAL HIGH (ref 80.0–100.0)
Platelets: 329 10*3/uL (ref 150–400)
RBC: 3.78 MIL/uL — ABNORMAL LOW (ref 3.87–5.11)
RDW: 14.7 % (ref 11.5–15.5)
WBC: 3.9 10*3/uL — ABNORMAL LOW (ref 4.0–10.5)
nRBC: 0 % (ref 0.0–0.2)

## 2023-02-12 LAB — URINALYSIS, ROUTINE W REFLEX MICROSCOPIC
Bilirubin Urine: NEGATIVE
Glucose, UA: NEGATIVE mg/dL
Hgb urine dipstick: NEGATIVE
Ketones, ur: NEGATIVE mg/dL
Leukocytes,Ua: NEGATIVE
Nitrite: NEGATIVE
Protein, ur: NEGATIVE mg/dL
Specific Gravity, Urine: 1.009 (ref 1.005–1.030)
pH: 8 (ref 5.0–8.0)

## 2023-02-12 LAB — COMPREHENSIVE METABOLIC PANEL
ALT: 9 U/L (ref 0–44)
AST: 16 U/L (ref 15–41)
Albumin: 3.9 g/dL (ref 3.5–5.0)
Alkaline Phosphatase: 63 U/L (ref 38–126)
Anion gap: 9 (ref 5–15)
BUN: 13 mg/dL (ref 8–23)
CO2: 28 mmol/L (ref 22–32)
Calcium: 9.3 mg/dL (ref 8.9–10.3)
Chloride: 101 mmol/L (ref 98–111)
Creatinine, Ser: 0.73 mg/dL (ref 0.44–1.00)
GFR, Estimated: >60 mL/min (ref >60)
Glucose, Bld: 93 mg/dL (ref 70–99)
Potassium: 3.6 mmol/L (ref 3.5–5.1)
Sodium: 138 mmol/L (ref 135–145)
Total Bilirubin: 0.9 mg/dL (ref 0.3–1.2)
Total Protein: 7.8 g/dL (ref 6.5–8.1)

## 2023-02-12 LAB — LIPASE, BLOOD: Lipase: 41 U/L (ref 11–51)

## 2023-02-12 MED ORDER — METHYLPREDNISOLONE SODIUM SUCC 40 MG IJ SOLR
40.0000 mg | Freq: Once | INTRAMUSCULAR | Status: AC
  Administered 2023-02-12: 40 mg via INTRAVENOUS
  Filled 2023-02-12: qty 1

## 2023-02-12 MED ORDER — FAMOTIDINE IN NACL 20-0.9 MG/50ML-% IV SOLN
20.0000 mg | Freq: Once | INTRAVENOUS | Status: AC
  Administered 2023-02-12: 20 mg via INTRAVENOUS
  Filled 2023-02-12: qty 50

## 2023-02-12 MED ORDER — IOHEXOL 300 MG/ML  SOLN
100.0000 mL | Freq: Once | INTRAMUSCULAR | Status: AC | PRN
  Administered 2023-02-12: 100 mL via INTRAVENOUS

## 2023-02-12 MED ORDER — DIPHENHYDRAMINE HCL 50 MG/ML IJ SOLN
50.0000 mg | Freq: Once | INTRAMUSCULAR | Status: AC
  Administered 2023-02-12: 50 mg via INTRAVENOUS
  Filled 2023-02-12: qty 1

## 2023-02-12 MED ORDER — SODIUM CHLORIDE (PF) 0.9 % IJ SOLN
INTRAMUSCULAR | Status: AC
  Filled 2023-02-12: qty 50

## 2023-02-12 MED ORDER — DIPHENHYDRAMINE HCL 25 MG PO CAPS: 50.0000 mg | ORAL_CAPSULE | Freq: Once | ORAL | Status: AC

## 2023-02-12 MED ORDER — ONDANSETRON HCL 4 MG/2ML IJ SOLN
4.0000 mg | Freq: Once | INTRAMUSCULAR | Status: AC
  Administered 2023-02-12: 4 mg via INTRAVENOUS
  Filled 2023-02-12: qty 2

## 2023-02-12 NOTE — Discharge Instructions (Signed)
You have been evaluated for your symptoms.  Overall your blood work today did not show any concerning findings.  CT scan of the abdomen pelvis obtained did not show any concerning finding however there are several lesions in your liver that we will need further evaluation with MRI of the abdomen with and without contrast to ensure this is not cancer related.  Please discuss this with your doctor and arrange for close follow-up imaging.  Return if you have any concern.

## 2023-02-12 NOTE — ED Provider Notes (Signed)
[place] EMERGENCY DEPARTMENT AT [facility] Provider Note   CSN: [id number] Arrival date & time: 02/12/23  0422     History  Chief Complaint  Patient presents with   Abdominal Pain    Janice Adams is a 67 y.o. female.  Patient presents to the emergency department complaining of generalized abdominal pain with nausea and vomiting which began at 9 PM last night.  The patient states he had similar pain a few days ago and was evaluated at the emergency department at that time.  She states she got some medicine for reflux-like symptoms which seem to help.  She does take omeprazole at home for reflux.  She denies diarrhea, urinary symptoms, chest pain, shortness of breath.  Past medical history significant for GERD, lung cancer, history of epigastric pain   Abdominal Pain      Home Medications Prior to Admission medications   Medication Sig Start Date End Date Taking? Authorizing Provider  levothyroxine (SYNTHROID) 150 MCG tablet Take 1 tablet (150 mcg total) by mouth daily before breakfast. 01/30/23  Yes Storm Frisk, MD  Multiple Vitamin (MULTIVITAMIN WITH MINERALS) TABS tablet Take 1 tablet by mouth daily.   Yes [provider]  omeprazole (PRILOSEC) 20 MG capsule TAKE 1 CAPSULE BY MOUTH DAILY 01/10/23  Yes Storm Frisk, MD  ondansetron (ZOFRAN) 8 MG tablet Take 1 tablet (8 mg total) by mouth every 8 (eight) hours as needed for nausea or vomiting. 01/30/23  Yes Amyot, Ali Lowe, NP  sucralfate (CARAFATE) 1 GM/10ML suspension Take 10 mLs (1 g total) by mouth 3 (three) times daily as needed (before meals). 01/30/23  Yes Amyot, Ali Lowe, NP  gabapentin (NEURONTIN) 300 MG capsule TAKE 1 CAPSULE(300 MG) BY MOUTH AT BEDTIME 01/26/23   Storm Frisk, MD  ondansetron (ZOFRAN-ODT) 4 MG disintegrating tablet Take 1 tablet (4 mg total) by mouth every 8 (eight) hours as needed. Patient not taking: Reported on 02/12/2023 02/09/23   Horton, Mayer Masker, MD  Vitamin D,  Ergocalciferol, (DRISDOL) 1.25 MG (50000 UNIT) CAPS capsule Take 1 capsule (50,000 Units total) by mouth every 7 (seven) days. 09/29/22   Storm Frisk, MD      Allergies    Ivp dye [iodinated contrast media]    Review of Systems   Review of Systems  Gastrointestinal:  Positive for abdominal pain.    Physical Exam Updated Vital Signs BP (!) 159/94 (BP Location: Left Arm)   Pulse 84   Temp 98.2 F (36.8 C)   Resp 18   Ht 5\' 4"  (1.626 m)   Wt 79.9 kg   LMP  (LMP Unknown)   SpO2 96%   BMI 30.24 kg/m  Physical Exam Vitals and nursing note reviewed.  Constitutional:      General: She is not in acute distress.    Appearance: She is well-developed.  HENT:     Head: Normocephalic and atraumatic.  Eyes:     Conjunctiva/sclera: Conjunctivae normal.  Cardiovascular:     Rate and Rhythm: Normal rate and regular rhythm.     Heart sounds: No murmur heard. Pulmonary:     Effort: Pulmonary effort is normal. No respiratory distress.     Breath sounds: Normal breath sounds.  Abdominal:     Palpations: Abdomen is soft.     Tenderness: There is generalized abdominal tenderness.  Musculoskeletal:        General: No swelling.     Cervical back: Neck supple.  Skin:  General: Skin is warm and dry.     Capillary Refill: Capillary refill takes less than 2 seconds.  Neurological:     Mental Status: She is alert.  Psychiatric:        Mood and Affect: Mood normal.     ED Results / Procedures / Treatments   Labs (all labs ordered are listed, but only abnormal results are displayed) Labs Reviewed  LIPASE, BLOOD  COMPREHENSIVE METABOLIC PANEL  CBC  URINALYSIS, ROUTINE W REFLEX MICROSCOPIC    EKG None  Radiology No results found.  Procedures Procedures    Medications Ordered in ED Medications  famotidine (PEPCID) IVPB 20 mg premix ( Intravenous Restarted 02/12/23 0603)  diphenhydrAMINE (BENADRYL) capsule 50 mg (has no administration in time range)    Or   diphenhydrAMINE (BENADRYL) injection 50 mg (has no administration in time range)  ondansetron (ZOFRAN) injection 4 mg (4 mg Intravenous Given 02/12/23 0550)  methylPREDNISolone sodium succinate (SOLU-MEDROL) 40 mg/mL injection 40 mg (40 mg Intravenous Given 02/12/23 0551)    ED Course/ Medical Decision Making/ A&P                                 Medical Decision Making Amount and/or Complexity of Data Reviewed Labs: ordered. Radiology: ordered.  Risk Prescription drug management.   This patient presents to the ED for concern of abdominal pain, this involves an extensive number of treatment options, and is a complaint that carries with it a high risk of complications and morbidity.  The differential diagnosis includes gastroenteritis, appendicitis, colitis, cholecystitis, GERD, others   Co morbidities that complicate the patient evaluation  GERD   Additional history obtained:  External records from outside source obtained and reviewed including recent emergency department notes   Lab Tests:  I Ordered, and personally interpreted labs.  CBC, lipase, CMP, and urinalysis ordered.   Imaging Studies ordered:  I ordered imaging studies including CT abdomen pelvis with contrast   Problem List / ED Course / Critical interventions / Medication management   I ordered medication including Solu-Medrol and Benadryl as premeds for CT, Pepcid for abdominal pain, Zofran for nausea Reevaluation of the patient after these medicines showed that the patient improved I have reviewed the patients home medicines and have made adjustments as needed   Test / Admission - Considered:  Patient care being transferred to Fayrene Helper, PA-C at shift handoff.  Disposition pending response to medication and results of lab labs and imaging.         Final Clinical Impression(s) / ED Diagnoses Final diagnoses:  None    Rx / DC Orders ED Discharge Orders     None         Pamala Duffel 02/12/23 5188    Nira Conn, MD 02/12/23 [phone]

## 2023-02-12 NOTE — ED Triage Notes (Signed)
Pt c/o abdominal pain with N/V since 2100 last night.

## 2023-02-12 NOTE — ED Provider Notes (Signed)
Pt here with abd pain.  Awaits abd/pelvis CT and is currently premedicate for CT scan.  Generalized abd pain.  If CT negative, can be d/c.  Patient was last seen several days ago for same presentation, thought to be acid reflux.  Labs and imaging obtained independently viewed by me.  Labs overall reassuring.  Abdominal pelvis CT scan without any acute concerning finding.  There is some indeterminant subcentimeter hypervascular lesion to the right hepatic lobe which could be benign etiology of metastatic disease.  Patient does have known history of lung cancer.  Encouraged patient to follow-up outpatient for further evaluation which may include abdominal MRI with and without contrast for further assessment.  I discussed this with patient.  She will follow-up with her PCP and will request imaging as appropriate.  On reexamination mild periumbilical tenderness but otherwise no guarding or rebound tenderness.  She is stable for discharge.  BP (!) 135/91   Pulse 86   Temp 98 F (36.7 C) (Oral)   Resp 18   Ht 5\' 4"  (1.626 m)   Wt 79.9 kg   LMP  (LMP Unknown)   SpO2 97%   BMI 30.24 kg/m   Results for orders placed or performed during the hospital encounter of 02/12/23  Lipase, blood  Result Value Ref Range   Lipase 41 11 - 51 U/L  Comprehensive metabolic panel  Result Value Ref Range   Sodium 138 135 - 145 mmol/L   Potassium 3.6 3.5 - 5.1 mmol/L   Chloride 101 98 - 111 mmol/L   CO2 28 22 - 32 mmol/L   Glucose, Bld 93 70 - 99 mg/dL   BUN 13 8 - 23 mg/dL   Creatinine, Ser 1.61 0.44 - 1.00 mg/dL   Calcium 9.3 8.9 - 09.6 mg/dL   Total Protein 7.8 6.5 - 8.1 g/dL   Albumin 3.9 3.5 - 5.0 g/dL   AST 16 15 - 41 U/L   ALT 9 0 - 44 U/L   Alkaline Phosphatase 63 38 - 126 U/L   Total Bilirubin 0.9 0.3 - 1.2 mg/dL   GFR, Estimated >04 >54 mL/min   Anion gap 9 5 - 15  CBC  Result Value Ref Range   WBC 3.9 (L) 4.0 - 10.5 K/uL   RBC 3.78 (L) 3.87 - 5.11 MIL/uL   Hemoglobin 13.0 12.0 - 15.0 g/dL    HCT 09.8 11.9 - 14.7 %   MCV 102.9 (H) 80.0 - 100.0 fL   MCH 34.4 (H) 26.0 - 34.0 pg   MCHC 33.4 30.0 - 36.0 g/dL   RDW 82.9 56.2 - 13.0 %   Platelets 329 150 - 400 K/uL   nRBC 0.0 0.0 - 0.2 %  Urinalysis, Routine w reflex microscopic -Urine, Clean Catch  Result Value Ref Range   Color, Urine STRAW (A) YELLOW   APPearance CLEAR CLEAR   Specific Gravity, Urine 1.009 1.005 - 1.030   pH 8.0 5.0 - 8.0   Glucose, UA NEGATIVE NEGATIVE mg/dL   Hgb urine dipstick NEGATIVE NEGATIVE   Bilirubin Urine NEGATIVE NEGATIVE   Ketones, ur NEGATIVE NEGATIVE mg/dL   Protein, ur NEGATIVE NEGATIVE mg/dL   Nitrite NEGATIVE NEGATIVE   Leukocytes,Ua NEGATIVE NEGATIVE   CT ABDOMEN PELVIS W CONTRAST  Result Date: 02/12/2023 CLINICAL DATA:  Acute abdominal pain and nausea and vomiting beginning last night. Lung carcinoma. * Tracking Code: BO * EXAM: CT ABDOMEN AND PELVIS WITH CONTRAST TECHNIQUE: Multidetector CT imaging of the abdomen and pelvis was performed  using the standard protocol following bolus administration of intravenous contrast. RADIATION DOSE REDUCTION: This exam was performed according to the departmental dose-optimization program which includes automated exposure control, adjustment of the mA and/or kV according to patient size and/or use of iterative reconstruction technique. CONTRAST:  OMNIPAQUE IOHEXOL 300 MG/ML  SOLN COMPARISON:  07/09/2022 FINDINGS: Lower Chest: No acute findings. Hepatobiliary: A 9 mm hypervascular lesion is seen in the anterior right hepatic lobe. A possible 2nd smaller hypervascular lesion is seen just posterior and inferior to this lesion in the right lobe. Gallbladder is unremarkable. No evidence of biliary ductal dilatation. Pancreas:  No mass or inflammatory changes. Spleen: Within normal limits in size and appearance. Adrenals/Urinary Tract: No suspicious masses identified. No evidence of ureteral calculi or hydronephrosis. Stomach/Bowel: No evidence of obstruction,  inflammatory process or abnormal fluid collections. Normal appendix visualized. Diverticulosis is seen mainly involving the sigmoid colon, however there is no evidence of diverticulitis. Vascular/Lymphatic: No pathologically enlarged lymph nodes. No acute vascular findings. Reproductive: Several small calcified uterine fibroids again seen, largest measuring 2.1 cm. Adnexal regions are unremarkable. Other:  None. Musculoskeletal:  No suspicious bone lesions identified. IMPRESSION: No acute findings within the abdomen or pelvis. Colonic diverticulosis, without radiographic evidence of diverticulitis. Small calcified uterine fibroids. Indeterminate sub-centimeter hypervascular lesions in the right hepatic lobe. Differential diagnosis includes both benign etiologies and metastatic disease. Recommend abdomen MRI without and with contrast for further characterization. Electronically Signed   By: Danae Orleans M.D.   On: 02/12/2023 10:43      Fayrene Helper, PA-C 02/12/23 1146    Bethann Berkshire, MD 02/14/23 1119

## 2023-02-20 ENCOUNTER — Emergency Department (HOSPITAL_BASED_OUTPATIENT_CLINIC_OR_DEPARTMENT_OTHER)
Admission: EM | Admit: 2023-02-20 | Discharge: 2023-02-20 | Disposition: A | Discharge status: Home / Self Care | Payer: 59 | Attending: Emergency Medicine | Admitting: Emergency Medicine

## 2023-02-20 ENCOUNTER — Other Ambulatory Visit: Payer: Self-pay | Admitting: Critical Care Medicine

## 2023-02-20 ENCOUNTER — Other Ambulatory Visit: Payer: Self-pay

## 2023-02-20 DIAGNOSIS — R112 Nausea with vomiting, unspecified: Secondary | ICD-10-CM | POA: Insufficient documentation

## 2023-02-20 DIAGNOSIS — I10 Essential (primary) hypertension: Secondary | ICD-10-CM | POA: Diagnosis not present

## 2023-02-20 DIAGNOSIS — R1013 Epigastric pain: Secondary | ICD-10-CM | POA: Diagnosis not present

## 2023-02-20 DIAGNOSIS — R109 Unspecified abdominal pain: Secondary | ICD-10-CM

## 2023-02-20 DIAGNOSIS — Z85118 Personal history of other malignant neoplasm of bronchus and lung: Secondary | ICD-10-CM | POA: Insufficient documentation

## 2023-02-20 DIAGNOSIS — R1010 Upper abdominal pain, unspecified: Secondary | ICD-10-CM | POA: Insufficient documentation

## 2023-02-20 LAB — COMPREHENSIVE METABOLIC PANEL
ALT: 5 U/L (ref 0–44)
AST: 13 U/L — ABNORMAL LOW (ref 15–41)
Albumin: 4.5 g/dL (ref 3.5–5.0)
Alkaline Phosphatase: 60 U/L (ref 38–126)
Anion gap: 10 (ref 5–15)
BUN: 9 mg/dL (ref 8–23)
CO2: 25 mmol/L (ref 22–32)
Calcium: 9.7 mg/dL (ref 8.9–10.3)
Chloride: 102 mmol/L (ref 98–111)
Creatinine, Ser: 0.8 mg/dL (ref 0.44–1.00)
GFR, Estimated: >60 mL/min (ref >60)
Glucose, Bld: 101 mg/dL — ABNORMAL HIGH (ref 70–99)
Potassium: 4 mmol/L (ref 3.5–5.1)
Sodium: 137 mmol/L (ref 135–145)
Total Bilirubin: 1 mg/dL (ref 0.3–1.2)
Total Protein: 7.9 g/dL (ref 6.5–8.1)

## 2023-02-20 LAB — URINALYSIS, ROUTINE W REFLEX MICROSCOPIC
Bilirubin Urine: NEGATIVE
Glucose, UA: NEGATIVE mg/dL
Hgb urine dipstick: NEGATIVE
Ketones, ur: NEGATIVE mg/dL
Leukocytes,Ua: NEGATIVE
Nitrite: NEGATIVE
Protein, ur: NEGATIVE mg/dL
Specific Gravity, Urine: 1.007 (ref 1.005–1.030)
pH: 6.5 (ref 5.0–8.0)

## 2023-02-20 LAB — CBC
HCT: 41 % (ref 36.0–46.0)
Hemoglobin: 13.9 g/dL (ref 12.0–15.0)
MCH: 34.1 pg — ABNORMAL HIGH (ref 26.0–34.0)
MCHC: 33.9 g/dL (ref 30.0–36.0)
MCV: 100.5 fL — ABNORMAL HIGH (ref 80.0–100.0)
Platelets: 307 10*3/uL (ref 150–400)
RBC: 4.08 MIL/uL (ref 3.87–5.11)
RDW: 14.8 % (ref 11.5–15.5)
WBC: 3.8 10*3/uL — ABNORMAL LOW (ref 4.0–10.5)
nRBC: 0 % (ref 0.0–0.2)

## 2023-02-20 LAB — LIPASE, BLOOD: Lipase: 17 U/L (ref 11–51)

## 2023-02-20 MED ORDER — ONDANSETRON 4 MG PO TBDP: 4.0000 mg | ORAL_TABLET | Freq: Three times a day (TID) | ORAL | 0 refills | Status: DC | PRN

## 2023-02-20 MED ORDER — ONDANSETRON 4 MG PO TBDP
4.0000 mg | ORAL_TABLET | Freq: Once | ORAL | Status: AC | PRN
  Administered 2023-02-20: 4 mg via ORAL
  Filled 2023-02-20: qty 1

## 2023-02-20 NOTE — Discharge Instructions (Signed)
Follow-up with your doctor.  You will likely need an MRI to evaluate to the potential liver mass.  Try and keep yourself hydrated.

## 2023-02-20 NOTE — ED Provider Notes (Signed)
[place] EMERGENCY DEPARTMENT AT [facility] Provider Note   CSN: [id number] Arrival date & time: 02/20/23  1444     History  Chief Complaint  Patient presents with   Abdominal Pain    Janice Adams is a 67 y.o. female.   Abdominal Pain Patient present abdominal pain.  Has had over a month now.  Has had nausea and vomiting.  On Prilosec.  Has had now 4 ER visits for it.  States last visit she was given Zofran while here.  States has not given any for home.  No diarrhea.  Has follow-up with PCP later this month.  Does have history of lung cancer.    Past Medical History:  Diagnosis Date   Back pain    Cancer (HCC)    Lung Cancer   GERD (gastroesophageal reflux disease)    Gout    no current problems per patient 05/31/19   History of epigastric pain    comes and goes   Hypertension    no meds   UTI (urinary tract infection) 08/19/2019    Home Medications Prior to Admission medications   Medication Sig Start Date End Date Taking? Authorizing Provider  gabapentin (NEURONTIN) 300 MG capsule TAKE 1 CAPSULE(300 MG) BY MOUTH AT BEDTIME 01/26/23   Storm Frisk, MD  levothyroxine (SYNTHROID) 150 MCG tablet Take 1 tablet (150 mcg total) by mouth daily before breakfast. 01/30/23   Storm Frisk, MD  Multiple Vitamin (MULTIVITAMIN WITH MINERALS) TABS tablet Take 1 tablet by mouth daily.    [provider]  omeprazole (PRILOSEC) 20 MG capsule TAKE 1 CAPSULE BY MOUTH DAILY 01/10/23   Storm Frisk, MD  ondansetron (ZOFRAN-ODT) 4 MG disintegrating tablet Take 1 tablet (4 mg total) by mouth every 8 (eight) hours as needed. 02/20/23   Benjiman Core, MD  sucralfate (CARAFATE) 1 GM/10ML suspension Take 10 mLs (1 g total) by mouth 3 (three) times daily as needed (before meals). 01/30/23   Sudie Grumbling, NP  Vitamin D, Ergocalciferol, (DRISDOL) 1.25 MG (50000 UNIT) CAPS capsule Take 1 capsule (50,000 Units total) by mouth every 7 (seven) days. 09/29/22    Storm Frisk, MD      Allergies    Ivp dye [iodinated contrast media]    Review of Systems   Review of Systems  Gastrointestinal:  Positive for abdominal pain.    Physical Exam Updated Vital Signs BP (!) 132/92   Pulse 86   Temp 98 F (36.7 C)   Resp 18   Ht 5\' 4"  (1.626 m)   Wt 78.9 kg   LMP  (LMP Unknown)   SpO2 92%   BMI 29.87 kg/m  Physical Exam Vitals and nursing note reviewed.  Cardiovascular:     Rate and Rhythm: Normal rate and regular rhythm.  Abdominal:     Tenderness: There is no abdominal tenderness.     Hernia: No hernia is present.  Skin:    General: Skin is warm.  Neurological:     Mental Status: She is alert.     ED Results / Procedures / Treatments   Labs (all labs ordered are listed, but only abnormal results are displayed) Labs Reviewed  COMPREHENSIVE METABOLIC PANEL - Abnormal; Notable for the following components:      Result Value   Glucose, Bld 101 (*)    AST 13 (*)    All other components within normal limits  CBC - Abnormal; Notable for the following components:   WBC  3.8 (*)    MCV 100.5 (*)    MCH 34.1 (*)    All other components within normal limits  URINALYSIS, ROUTINE W REFLEX MICROSCOPIC - Abnormal; Notable for the following components:   Color, Urine COLORLESS (*)    All other components within normal limits  LIPASE, BLOOD    EKG None  Radiology No results found.  Procedures Procedures    Medications Ordered in ED Medications  ondansetron (ZOFRAN-ODT) disintegrating tablet 4 mg (4 mg Oral Given 02/20/23 1537)    ED Course/ Medical Decision Making/ A&P                                 Medical Decision Making Amount and/or Complexity of Data Reviewed Labs: ordered.  Risk Prescription drug management.   Patient abdominal pain.  Upper abdomen.  May be worse after eating.  Blood work reassuring.  Reviewed previous ER visits and imaging.  Since most recent CT scan 8 days ago showed hypervascular  lesions.  Could be benign or potentially metastatic disease.  Will likely need MRI but does not need emergently and I do not have access to right now.  PCP should be able to order this.  Blood work reassuring does not show too much dehydration.  Does not have Zofran at home.  Will treat with this but should be able to discharge.         Final Clinical Impression(s) / ED Diagnoses Final diagnoses:  Abdominal pain, unspecified abdominal location  Nausea and vomiting, unspecified vomiting type    Rx / DC Orders ED Discharge Orders          Ordered    ondansetron (ZOFRAN-ODT) 4 MG disintegrating tablet  Every 8 hours PRN,   Status:  Discontinued        02/20/23 1712    ondansetron (ZOFRAN-ODT) 4 MG disintegrating tablet  Every 8 hours PRN        02/20/23 1720              Benjiman Core, MD 02/20/23 2317

## 2023-02-20 NOTE — ED Notes (Signed)
Pt given ginger ale and saltine crackers to PO challenge at this time.

## 2023-02-20 NOTE — ED Triage Notes (Signed)
Abd pain since 0900 today. Sharp cramping feeling. Points to center abd. Non-tender. Last BM this morning. Afebrile. +N/+V x3 today.  Endorses GERD. Takes prilosec daily.

## 2023-02-20 NOTE — ED Notes (Signed)
Pt tolerating PO challenge at this time. Denies N/V, or increased abd pain

## 2023-02-21 NOTE — Telephone Encounter (Signed)
Request is too soon for refill. Last refill 01/10/23 for 100 days.  Requested Prescriptions  Pending Prescriptions Disp Refills   omeprazole (PRILOSEC) 20 MG capsule [Pharmacy Med Name: Omeprazole 20 MG Oral Capsule Delayed Release] 100 capsule 2    Sig: TAKE 1 CAPSULE BY MOUTH DAILY     Gastroenterology: Proton Pump Inhibitors Passed - 02/20/2023 10:17 PM      Passed - Valid encounter within last 12 months    Recent Outpatient Visits           7 months ago Hypothyroidism, unspecified type   [facility] Storm Frisk, MD   1 year ago Hypothyroidism, unspecified type   [facility] Storm Frisk, MD   1 year ago Paresthesia   [facility] [place], [place], [place]   3 years ago Adenocarcinoma of right lung [facility])   [facility] Hoy Register, MD   3 years ago Sepsis, due to unspecified organism, unspecified whether acute organ dysfunction present [facility])   [facility] [place], [place]ella [place], [place]       Future Appointments             In 1 week [place] Charlcie Cradle, MD [facility]

## 2023-02-23 ENCOUNTER — Observation Stay (HOSPITAL_COMMUNITY)
Admission: EM | Admit: 2023-02-23 | Discharge: 2023-02-25 | Disposition: A | Discharge status: Home / Self Care | Payer: 59 | Attending: Internal Medicine | Admitting: Internal Medicine

## 2023-02-23 ENCOUNTER — Encounter (HOSPITAL_COMMUNITY): Payer: Self-pay

## 2023-02-23 ENCOUNTER — Other Ambulatory Visit: Payer: Self-pay

## 2023-02-23 DIAGNOSIS — R42 Dizziness and giddiness: Principal | ICD-10-CM | POA: Insufficient documentation

## 2023-02-23 DIAGNOSIS — K769 Liver disease, unspecified: Secondary | ICD-10-CM

## 2023-02-23 DIAGNOSIS — C342 Malignant neoplasm of middle lobe, bronchus or lung: Secondary | ICD-10-CM | POA: Diagnosis present

## 2023-02-23 DIAGNOSIS — Z79899 Other long term (current) drug therapy: Secondary | ICD-10-CM | POA: Diagnosis not present

## 2023-02-23 DIAGNOSIS — I10 Essential (primary) hypertension: Secondary | ICD-10-CM | POA: Diagnosis not present

## 2023-02-23 DIAGNOSIS — E860 Dehydration: Principal | ICD-10-CM | POA: Insufficient documentation

## 2023-02-23 DIAGNOSIS — R55 Syncope and collapse: Secondary | ICD-10-CM | POA: Diagnosis not present

## 2023-02-23 DIAGNOSIS — R634 Abnormal weight loss: Secondary | ICD-10-CM | POA: Insufficient documentation

## 2023-02-23 DIAGNOSIS — I951 Orthostatic hypotension: Secondary | ICD-10-CM | POA: Insufficient documentation

## 2023-02-23 DIAGNOSIS — R000 Tachycardia, unspecified: Secondary | ICD-10-CM | POA: Diagnosis not present

## 2023-02-23 DIAGNOSIS — E669 Obesity, unspecified: Secondary | ICD-10-CM

## 2023-02-23 DIAGNOSIS — R1011 Right upper quadrant pain: Secondary | ICD-10-CM | POA: Diagnosis not present

## 2023-02-23 DIAGNOSIS — D1803 Hemangioma of intra-abdominal structures: Secondary | ICD-10-CM

## 2023-02-23 DIAGNOSIS — R110 Nausea: Secondary | ICD-10-CM

## 2023-02-23 DIAGNOSIS — Z87891 Personal history of nicotine dependence: Secondary | ICD-10-CM | POA: Insufficient documentation

## 2023-02-23 DIAGNOSIS — R1013 Epigastric pain: Secondary | ICD-10-CM

## 2023-02-23 DIAGNOSIS — N179 Acute kidney failure, unspecified: Secondary | ICD-10-CM | POA: Diagnosis not present

## 2023-02-23 DIAGNOSIS — E039 Hypothyroidism, unspecified: Secondary | ICD-10-CM | POA: Insufficient documentation

## 2023-02-23 DIAGNOSIS — Z85118 Personal history of other malignant neoplasm of bronchus and lung: Secondary | ICD-10-CM | POA: Insufficient documentation

## 2023-02-23 DIAGNOSIS — K2970 Gastritis, unspecified, without bleeding: Secondary | ICD-10-CM | POA: Diagnosis not present

## 2023-02-23 LAB — COMPREHENSIVE METABOLIC PANEL
ALT: 10 U/L (ref 0–44)
AST: 20 U/L (ref 15–41)
Albumin: 4 g/dL (ref 3.5–5.0)
Alkaline Phosphatase: 60 U/L (ref 38–126)
Anion gap: 10 (ref 5–15)
BUN: 14 mg/dL (ref 8–23)
CO2: 26 mmol/L (ref 22–32)
Calcium: 10.2 mg/dL (ref 8.9–10.3)
Chloride: 103 mmol/L (ref 98–111)
Creatinine, Ser: 1.22 mg/dL — ABNORMAL HIGH (ref 0.44–1.00)
GFR, Estimated: 48 mL/min — ABNORMAL LOW (ref >60)
Glucose, Bld: 101 mg/dL — ABNORMAL HIGH (ref 70–99)
Potassium: 3.7 mmol/L (ref 3.5–5.1)
Sodium: 139 mmol/L (ref 135–145)
Total Bilirubin: 1.1 mg/dL (ref 0.3–1.2)
Total Protein: 7.6 g/dL (ref 6.5–8.1)

## 2023-02-23 LAB — CBC
HCT: 43.2 % (ref 36.0–46.0)
Hemoglobin: 14.4 g/dL (ref 12.0–15.0)
MCH: 34.4 pg — ABNORMAL HIGH (ref 26.0–34.0)
MCHC: 33.3 g/dL (ref 30.0–36.0)
MCV: 103.1 fL — ABNORMAL HIGH (ref 80.0–100.0)
Platelets: 302 10*3/uL (ref 150–400)
RBC: 4.19 MIL/uL (ref 3.87–5.11)
RDW: 14.7 % (ref 11.5–15.5)
WBC: 5.1 10*3/uL (ref 4.0–10.5)
nRBC: 0 % (ref 0.0–0.2)

## 2023-02-23 LAB — LIPASE, BLOOD: Lipase: 24 U/L (ref 11–51)

## 2023-02-23 LAB — TROPONIN I (HIGH SENSITIVITY)
Troponin I (High Sensitivity): 6 ng/L (ref <18)
Troponin I (High Sensitivity): 5 ng/L (ref <18)

## 2023-02-23 MED ORDER — SODIUM CHLORIDE 0.9 % IV BOLUS
1000.0000 mL | Freq: Once | INTRAVENOUS | Status: AC
  Administered 2023-02-23: 1000 mL via INTRAVENOUS

## 2023-02-23 MED ORDER — ALUM & MAG HYDROXIDE-SIMETH 200-200-20 MG/5ML PO SUSP
30.0000 mL | Freq: Once | ORAL | Status: AC
  Administered 2023-02-23: 30 mL via ORAL
  Filled 2023-02-23: qty 30

## 2023-02-23 MED ORDER — LIDOCAINE VISCOUS HCL 2 % MT SOLN
15.0000 mL | Freq: Once | OROMUCOSAL | Status: AC
  Administered 2023-02-23: 15 mL via ORAL
  Filled 2023-02-23: qty 15

## 2023-02-23 NOTE — ED Notes (Addendum)
PT was a hard stick, IV team consulted.

## 2023-02-23 NOTE — ED Provider Notes (Signed)
[place] EMERGENCY DEPARTMENT AT [facility] Provider Note   CSN: [id number] Arrival date & time: 02/23/23  1444     History  Chief Complaint  Patient presents with   Dizziness    Lexys Krider is a 67 y.o. 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  Patient denies any chest pain, shortness of breath, pleuritic chest pain.  No previous history of PE, she does not take a blood thinner.  She reports this is somewhat similar to the pain that she has had over the last few visits to the emergency department, but was worse today. Reports some lightheadedness with standing.   Dizziness      Home Medications Prior to Admission medications   Medication Sig Start Date End Date Taking? Authorizing Provider  gabapentin (NEURONTIN) 300 MG capsule TAKE 1 CAPSULE(300 MG) BY MOUTH AT BEDTIME 01/26/23  Yes Storm Frisk, MD  levothyroxine (SYNTHROID) 150 MCG tablet Take 1 tablet (150 mcg total) by mouth daily before breakfast. 01/30/23  Yes Storm Frisk, MD  omeprazole (PRILOSEC) 20 MG capsule TAKE 1 CAPSULE BY MOUTH DAILY 01/10/23  Yes Storm Frisk, MD  ondansetron (ZOFRAN-ODT) 4 MG disintegrating tablet Take 1 tablet (4 mg total) by mouth every 8 (eight) hours as needed. 02/20/23  Yes Benjiman Core, MD  VITAMIN D PO Take 1 tablet by mouth daily.   Yes [provider]      Allergies    Ivp dye [iodinated contrast media]    Review of Systems   Review of Systems  Neurological:  Positive for dizziness.  All other systems reviewed and are negative.   Physical Exam Updated Vital Signs BP (!) 150/109    Pulse (!) 107   Temp 98.2 F (36.8 C) (Oral)   Resp (!) 30   Ht 5\' 4"  (1.626 m)   Wt 79.4 kg   LMP  (LMP Unknown)   SpO2 94%   BMI 30.04 kg/m  Physical Exam Vitals and nursing note reviewed.  Constitutional:      General: She is not in acute distress.    Appearance: Normal appearance.  HENT:     Head: Normocephalic and atraumatic.  Eyes:     General:        Right eye: No discharge.        Left eye: No discharge.  Cardiovascular:     Rate and Rhythm: Regular rhythm. Tachycardia present.     Heart sounds: No murmur heard.    No friction rub. No gallop.     Comments: Patient with some tachycardia worse with standing Pulmonary:     Effort: Pulmonary effort is normal.     Breath sounds: Normal breath sounds.  Abdominal:     General: Bowel sounds are normal.     Palpations: Abdomen is soft.  Skin:    General: Skin is warm and dry.     Capillary Refill: Capillary refill takes less than 2 seconds.  Neurological:     Mental Status: She is alert and oriented to person, place, and time.     Comments: Moves all 4 limbs spontaneously, CN II through XII grossly intact, can ambulate without difficulty, intact sensation  throughout.  Endorses some lightheadedness with standing but with negative Romberg, normal gait.  Psychiatric:        Mood and Affect: Mood normal.        Behavior: Behavior normal.     ED Results / Procedures / Treatments   Labs (all labs ordered are listed, but only abnormal results are displayed) Labs Reviewed  CBC - Abnormal; Notable for the following components:      Result Value   MCV 103.1 (*)    MCH 34.4 (*)    All other components within normal limits  COMPREHENSIVE METABOLIC PANEL - Abnormal; Notable for the following components:   Glucose, Bld 101 (*)    Creatinine, Ser 1.22 (*)    GFR, Estimated 48 (*)    All other components within normal limits  LIPASE, BLOOD  URINALYSIS, ROUTINE W REFLEX MICROSCOPIC  TROPONIN I (HIGH SENSITIVITY)  TROPONIN  I (HIGH SENSITIVITY)    EKG None  Radiology No results found.  Procedures Procedures    Medications Ordered in ED Medications  sodium chloride 0.9 % bolus 1,000 mL (0 mLs Intravenous Stopped 02/23/23 1936)  alum & mag hydroxide-simeth (MAALOX/MYLANTA) 200-200-20 MG/5ML suspension 30 mL (30 mLs Oral Given 02/23/23 1631)    And  lidocaine (XYLOCAINE) 2 % viscous mouth solution 15 mL (15 mLs Oral Given 02/23/23 1631)  sodium chloride 0.9 % bolus 1,000 mL (0 mLs Intravenous Stopped 02/23/23 2136)    ED Course/ Medical Decision Making/ A&P Clinical Course as of 02/23/23 2151  Thu Feb 23, 2023  1829 Creatinine(!): 1.22 Mild AKI from recent value.  Patient endorses poor oral intake.  Suspect that her dizziness is likely secondary to dehydration.  Her abdominal pain is resolved on reevaluation.  Initial troponin is normal, delta troponin pending [CP]    Clinical Course User Index [CP] Kiandra Sanguinetti, Harrel Carina, PA-C                                 Medical Decision Making Amount and/or Complexity of Data Reviewed Labs: ordered. Decision-making details documented in ED Course.  Risk OTC drugs. Prescription drug management.   This patient is a 67 y.o. female  who presents to the ED for concern of dizziness, epigastric abdominal pain.   Differential diagnoses prior to evaluation: The emergent differential diagnosis includes, but is not limited to,  esophagitis, gastritis, peptic ulcer disease, esophageal rupture, gastric rupture, Boerhaave's, Mallory-Weiss, pancreatitis, cholecystitis, cholangitis, acute mesenteric ischemia, atypical chest pain or ACS, lower lobar pneumonia versus other, BPPV, vestibular migraine, head trauma, AVM, intracranial tumor, multiple sclerosis, drug-related, CVA, vasovagal syncope, orthostatic hypotension, sepsis, hypoglycemia, electrolyte disturbance, anemia, anxiety/panic attack . This is not an exhaustive differential.   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 Pertinent results include: reviewed labwork, imaging from multiple recent past visits to the ED, for similar complaints  Physical Exam: Physical exam performed. The pertinent findings include: Patient with some tachycardia worse with standing Moves all 4 limbs spontaneously, CN II through XII grossly intact, can ambulate without difficulty, intact sensation throughout.  Endorses some lightheadedness with standing but with negative Romberg, normal gait.    Lab Tests/Imaging studies: I personally interpreted labs/imaging and the pertinent results include: CMP with elevated creatinine, 1.22, up from baseline of  0.8, suspicious for dehydration causing orthostatic hypotension.  CBC unremarkable.  Normal lipase, normal troponin x 2.  Cardiac monitoring: EKG obtained and interpreted by myself and attending physician which shows: Normal sinus rhythm   Medications: I ordered medication including fluid bolus x 2, GI cocktail for epigastric abdominal pain.  I have reviewed the patients home medicines and have made adjustments as needed.   Consults: Spoke with the hospitalist, Dr. Cyndia Bent, and after discussion of lab work, patient's response to treatment they agree to admission for persistent orthostatic hypotension, dizziness, dehydration  Disposition: After consideration of the diagnostic results and the patients response to treatment, I feel that would benefit from hospital admission as discussed above.   Final Clinical Impression(s) / ED Diagnoses Final diagnoses:  Dehydration  AKI (acute kidney injury) (HCC)  Orthostatic hypotension    Rx / DC Orders ED Discharge Orders     None         Olene Floss, PA-C 02/23/23 2151    Franne Forts, DO 02/24/23 0202

## 2023-02-23 NOTE — ED Triage Notes (Signed)
PT BIB EMS from home for lightheadedness upon standing and sharp central abdominal pain. Pt denies symptoms at rest. Pt denies CP and SOB.

## 2023-02-23 NOTE — ED Notes (Signed)
Urine sample still needs to be collected, it was checked off by accident.

## 2023-02-23 NOTE — ED Notes (Signed)
PT stated that the pain in her abdomen has came back rating it a 8. I palpitated her abdomen and it was very painful near the umbilicus area. PT stated she had relief with pain medication and when she burped earlier.

## 2023-02-24 ENCOUNTER — Observation Stay (HOSPITAL_COMMUNITY): Payer: 59 | Admitting: Registered Nurse

## 2023-02-24 ENCOUNTER — Encounter (HOSPITAL_COMMUNITY)
Admission: EM | Disposition: A | Discharge status: Home / Self Care | Payer: Self-pay | Source: Home / Self Care | Attending: Emergency Medicine

## 2023-02-24 ENCOUNTER — Observation Stay (HOSPITAL_COMMUNITY): Payer: 59

## 2023-02-24 ENCOUNTER — Encounter (HOSPITAL_COMMUNITY): Payer: Self-pay | Admitting: Family Medicine

## 2023-02-24 DIAGNOSIS — K769 Liver disease, unspecified: Secondary | ICD-10-CM | POA: Diagnosis not present

## 2023-02-24 DIAGNOSIS — K2950 Unspecified chronic gastritis without bleeding: Secondary | ICD-10-CM

## 2023-02-24 DIAGNOSIS — K8020 Calculus of gallbladder without cholecystitis without obstruction: Secondary | ICD-10-CM | POA: Diagnosis not present

## 2023-02-24 DIAGNOSIS — D1803 Hemangioma of intra-abdominal structures: Secondary | ICD-10-CM | POA: Diagnosis not present

## 2023-02-24 DIAGNOSIS — R1013 Epigastric pain: Secondary | ICD-10-CM

## 2023-02-24 DIAGNOSIS — C342 Malignant neoplasm of middle lobe, bronchus or lung: Secondary | ICD-10-CM

## 2023-02-24 DIAGNOSIS — E039 Hypothyroidism, unspecified: Secondary | ICD-10-CM

## 2023-02-24 DIAGNOSIS — E669 Obesity, unspecified: Secondary | ICD-10-CM

## 2023-02-24 DIAGNOSIS — N179 Acute kidney failure, unspecified: Secondary | ICD-10-CM | POA: Diagnosis not present

## 2023-02-24 DIAGNOSIS — R634 Abnormal weight loss: Secondary | ICD-10-CM | POA: Diagnosis not present

## 2023-02-24 DIAGNOSIS — R110 Nausea: Secondary | ICD-10-CM | POA: Diagnosis not present

## 2023-02-24 DIAGNOSIS — R1900 Intra-abdominal and pelvic swelling, mass and lump, unspecified site: Secondary | ICD-10-CM | POA: Diagnosis not present

## 2023-02-24 DIAGNOSIS — K2970 Gastritis, unspecified, without bleeding: Secondary | ICD-10-CM

## 2023-02-24 DIAGNOSIS — R42 Dizziness and giddiness: Secondary | ICD-10-CM | POA: Diagnosis not present

## 2023-02-24 HISTORY — PX: ESOPHAGOGASTRODUODENOSCOPY (EGD) WITH PROPOFOL: SHX5813

## 2023-02-24 HISTORY — PX: BIOPSY: SHX5522

## 2023-02-24 LAB — BASIC METABOLIC PANEL
Anion gap: 12 (ref 5–15)
BUN: 8 mg/dL (ref 8–23)
CO2: 22 mmol/L (ref 22–32)
Calcium: 9 mg/dL (ref 8.9–10.3)
Chloride: 104 mmol/L (ref 98–111)
Creatinine, Ser: 0.78 mg/dL (ref 0.44–1.00)
GFR, Estimated: >60 mL/min (ref >60)
Glucose, Bld: 80 mg/dL (ref 70–99)
Potassium: 3.8 mmol/L (ref 3.5–5.1)
Sodium: 138 mmol/L (ref 135–145)

## 2023-02-24 LAB — IRON AND TIBC
Iron: 110 ug/dL (ref 28–170)
Saturation Ratios: 31 % (ref 10.4–31.8)
TIBC: 356 ug/dL (ref 250–450)
UIBC: 246 ug/dL

## 2023-02-24 LAB — VITAMIN B12: Vitamin B-12: 346 pg/mL (ref 180–914)

## 2023-02-24 LAB — FOLATE: Folate: 12.9 ng/mL (ref >5.9)

## 2023-02-24 LAB — RETICULOCYTES
Immature Retic Fract: 13 % (ref 2.3–15.9)
RBC.: 3.51 MIL/uL — ABNORMAL LOW (ref 3.87–5.11)
Retic Count, Absolute: 49.8 10*3/uL (ref 19.0–186.0)
Retic Ct Pct: 1.4 % (ref 0.4–3.1)

## 2023-02-24 LAB — FERRITIN: Ferritin: 55 ng/mL (ref 11–307)

## 2023-02-24 SURGERY — ESOPHAGOGASTRODUODENOSCOPY (EGD) WITH PROPOFOL
Anesthesia: Monitor Anesthesia Care

## 2023-02-24 MED ORDER — LABETALOL HCL 5 MG/ML IV SOLN: 10.0000 mg | INTRAVENOUS | Status: DC | PRN

## 2023-02-24 MED ORDER — PROPOFOL 10 MG/ML IV BOLUS
INTRAVENOUS | Status: DC | PRN
  Administered 2023-02-24 (×3): 30 mg via INTRAVENOUS

## 2023-02-24 MED ORDER — OXYCODONE HCL 5 MG/5ML PO SOLN: 5.0000 mg | Freq: Once | ORAL | Status: DC | PRN

## 2023-02-24 MED ORDER — ALBUMIN HUMAN 5 % IV SOLN
INTRAVENOUS | Status: AC
  Filled 2023-02-24: qty 250

## 2023-02-24 MED ORDER — GABAPENTIN 300 MG PO CAPS
300.0000 mg | ORAL_CAPSULE | Freq: Every day | ORAL | Status: DC
  Administered 2023-02-24: 300 mg via ORAL
  Filled 2023-02-24: qty 1

## 2023-02-24 MED ORDER — FENTANYL CITRATE (PF) 100 MCG/2ML IJ SOLN: 25.0000 ug | INTRAMUSCULAR | Status: DC | PRN

## 2023-02-24 MED ORDER — DROPERIDOL 2.5 MG/ML IJ SOLN: 0.6250 mg | Freq: Once | INTRAMUSCULAR | Status: DC | PRN

## 2023-02-24 MED ORDER — GLYCOPYRROLATE PF 0.2 MG/ML IJ SOSY
PREFILLED_SYRINGE | INTRAMUSCULAR | Status: DC | PRN
  Administered 2023-02-24: 1 mg via INTRAVENOUS

## 2023-02-24 MED ORDER — ALBUMIN HUMAN 5 % IV SOLN
12.5000 g | Freq: Once | INTRAVENOUS | Status: AC
  Administered 2023-02-24: 12.5 g via INTRAVENOUS

## 2023-02-24 MED ORDER — LEVOTHYROXINE SODIUM 75 MCG PO TABS
150.0000 ug | ORAL_TABLET | Freq: Every day | ORAL | Status: DC
  Administered 2023-02-24 – 2023-02-25 (×2): 150 ug via ORAL
  Filled 2023-02-24 (×2): qty 2

## 2023-02-24 MED ORDER — LACTATED RINGERS IV SOLN: INTRAVENOUS | Status: AC

## 2023-02-24 MED ORDER — OXYCODONE HCL 5 MG PO TABS: 5.0000 mg | ORAL_TABLET | Freq: Once | ORAL | Status: DC | PRN

## 2023-02-24 MED ORDER — ONDANSETRON HCL 4 MG/2ML IJ SOLN: 4.0000 mg | Freq: Four times a day (QID) | INTRAMUSCULAR | Status: DC | PRN

## 2023-02-24 MED ORDER — LABETALOL HCL 5 MG/ML IV SOLN
INTRAVENOUS | Status: AC
  Administered 2023-02-24: 10 mg via INTRAVENOUS
  Filled 2023-02-24: qty 4

## 2023-02-24 MED ORDER — LIDOCAINE 2% (20 MG/ML) 5 ML SYRINGE
INTRAMUSCULAR | Status: DC | PRN
  Administered 2023-02-24: 80 mg via INTRAVENOUS

## 2023-02-24 MED ORDER — PROPOFOL 500 MG/50ML IV EMUL
INTRAVENOUS | Status: DC | PRN
  Administered 2023-02-24: 120 ug/kg/min via INTRAVENOUS

## 2023-02-24 MED ORDER — PANTOPRAZOLE SODIUM 40 MG PO TBEC
40.0000 mg | DELAYED_RELEASE_TABLET | Freq: Two times a day (BID) | ORAL | Status: DC
  Administered 2023-02-24 – 2023-02-25 (×4): 40 mg via ORAL
  Filled 2023-02-24 (×4): qty 1

## 2023-02-24 MED ORDER — LACTATED RINGERS IV SOLN
INTRAVENOUS | Status: DC | PRN
Start: 2023-02-24 — End: 2023-02-24

## 2023-02-24 MED ORDER — ACETAMINOPHEN 10 MG/ML IV SOLN: 1000.0000 mg | Freq: Once | INTRAVENOUS | Status: DC | PRN

## 2023-02-24 MED ORDER — GADOBUTROL 1 MMOL/ML IV SOLN
8.0000 mL | Freq: Once | INTRAVENOUS | Status: AC | PRN
  Administered 2023-02-24: 8 mL via INTRAVENOUS

## 2023-02-24 MED ORDER — MORPHINE SULFATE (PF) 2 MG/ML IV SOLN: 2.0000 mg | INTRAVENOUS | Status: DC | PRN

## 2023-02-24 MED ORDER — ENOXAPARIN SODIUM 40 MG/0.4ML IJ SOSY
40.0000 mg | PREFILLED_SYRINGE | INTRAMUSCULAR | Status: DC
  Administered 2023-02-24 (×2): 40 mg via SUBCUTANEOUS
  Filled 2023-02-24 (×2): qty 0.4

## 2023-02-24 SURGICAL SUPPLY — 15 items

## 2023-02-24 NOTE — H&P (Signed)
History and Physical    Patient: Janice Adams UJW:[id number] DOB: Apr 02, 1956 DOA: 02/23/2023 DOS: the patient was seen and examined on 02/24/2023 PCP: Storm Frisk, MD  Patient coming from: Home  Chief Complaint:  Chief Complaint  Patient presents with   Dizziness   HPI: Janice Adams is a 67 y.o. female with medical history significant of non-small cell lung cancer s/p right lower/middle lobectomy, chemoradiation, immunotherapy under observation, hypothyroidism, GERD who dizziness and epigastric abdominal pain.   Has been having ongoing epigastric pain since September and was evaluated multiple times at urgent care and ED. Last had CT abd/pelvis on 02/12/23 with no acute findings of the abdomen or pelvis. There was an indeterminate sub-centimeter hypervascular lesions in the right hepatic lobe and abdomen MRI with and without was recommend to evaluate benign vs metastatic disease.   Pain was much worse today with nausea and vomiting so decided to come in. Pain is sharp in epigastric region and has been intermittent but increasing in frequency. Unsure if it correlates with food but has not been eating well. Has reported weight loss in the past month. Thinks she went from 200lbs to 174lbs. Denies any dysphagia or odynophagia.No diarrhea or constipation. No dark stools or bleeding. She is on omeprazole daily. Denies NSAIDS or alcohol use. No recent travel.   On arrival to ED, presented with tachycardia but was normotensive and afebrile.   CBC unremarkable.  BMP notable for AKI with creatinine of 1.22 with prior of 0.80.   Orthostatic vital signs were possible with sitting SBP at 160 and standing at 149.   She was giving GI cocktail and 2L of IV NS bolus and hospitalist was consulted for admission.  Review of Systems: As mentioned in the history of present illness. All other systems reviewed and are negative. Past Medical History:  Diagnosis Date   Back pain    Cancer (HCC)     Lung Cancer   GERD (gastroesophageal reflux disease)    Gout    no current problems per patient 05/31/19   History of epigastric pain    comes and goes   Hypertension    no meds   UTI (urinary tract infection) 08/19/2019   Past Surgical History:  Procedure Laterality Date   INTERCOSTAL NERVE BLOCK Right 12/30/2019   Procedure: INTERCOSTAL NERVE BLOCK;  Surgeon: Loreli Slot, MD;  Location: [facility] OR;  Service: Thoracic;  Laterality: Right;   LYMPH NODE DISSECTION Right 12/30/2019   Procedure: LYMPH NODE DISSECTION;  Surgeon: Loreli Slot, MD;  Location: [facility] OR;  Service: Thoracic;  Laterality: Right;   MULTIPLE TOOTH EXTRACTIONS     TONSILLECTOMY     uterine ablation     VIDEO BRONCHOSCOPY N/A 12/30/2019   Procedure: VIDEO BRONCHOSCOPY;  Surgeon: Loreli Slot, MD;  Location: [facility] OR;  Service: Thoracic;  Laterality: N/A;   VIDEO BRONCHOSCOPY WITH ENDOBRONCHIAL ULTRASOUND N/A 06/04/2019   Procedure: VIDEO BRONCHOSCOPY WITH ENDOBRONCHIAL ULTRASOUND;  Surgeon: Luciano Cutter, MD;  Location: [facility] OR;  Service: Thoracic;  Laterality: N/A;   WISDOM TOOTH EXTRACTION     Social History:  reports that she quit smoking about 3 years ago. Her smoking use included cigarettes. She started smoking about 50 years ago. She has a 23.5 pack-year smoking history. She has never used smokeless tobacco. She reports that she does not drink alcohol and does not use drugs.  Allergies  Allergen Reactions   Ivp Dye [Iodinated Contrast Media] Other (See Comments)    Pt  reports makes her hands and feet numb    Family History  Problem Relation Age of Onset   Breast cancer Mother    Cancer Mother    Seizures Father    Breast cancer Maternal Grandmother    Breast cancer Daughter    Cancer Daughter    Heart attack Brother     Prior to Admission medications   Medication Sig Start Date End Date Taking? Authorizing Provider  gabapentin (NEURONTIN) 300 MG capsule TAKE 1 CAPSULE(300 MG) BY  MOUTH AT BEDTIME 01/26/23  Yes Storm Frisk, MD  levothyroxine (SYNTHROID) 150 MCG tablet Take 1 tablet (150 mcg total) by mouth daily before breakfast. 01/30/23  Yes Storm Frisk, MD  omeprazole (PRILOSEC) 20 MG capsule TAKE 1 CAPSULE BY MOUTH DAILY 01/10/23  Yes Storm Frisk, MD  ondansetron (ZOFRAN-ODT) 4 MG disintegrating tablet Take 1 tablet (4 mg total) by mouth every 8 (eight) hours as needed. 02/20/23  Yes Benjiman Core, MD  VITAMIN D PO Take 1 tablet by mouth daily.   Yes [provider]    Physical Exam: Vitals:   02/23/23 2205 02/23/23 2210 02/23/23 2215 02/23/23 2220  BP:      Pulse: 77 67 68 87  Resp: 17 16 18 17   Temp:      TempSrc:      SpO2: 99% 99% 100% 99%  Weight:      Height:       Constitutional: NAD, calm, comfortable, well-appearing obese female who laying in bed Eyes: lids and conjunctivae normal ENMT: Mucous membranes are moist.  Neck: normal, supple Respiratory: clear to auscultation bilaterally, no wheezing, no crackles. Normal respiratory effort. No accessory muscle use.  Cardiovascular: Regular rate and rhythm, no murmurs / rubs / gallops. No extremity edema.  Abdomen: Soft, nontender, slightly distended abdomen.  No rebound tenderness, guarding or rigidity.  Positive bowel sounds throughout.   musculoskeletal: no clubbing / cyanosis. No joint deformity upper and lower extremities.  Normal muscle tone.  Skin: no rashes, lesions, ulcers. No induration Neurologic: CN 2-12 grossly intact.  Psychiatric: Normal judgment and insight. Alert and oriented x 3. Normal mood initially but was tearful towards the end of evaluation due to concern about her persistent symptoms.   Data Reviewed:  See HPI  Assessment and Plan: * Epigastric pain -ongoing intermittent epigastric pain for the past month. Has been having low appetite and reports almost 30 lbs weight loss in the past month. Denies any dysphagia or odynophagia. Differential could be  gastritis, gastric ulcer, malignancy.  -obtain H.pylori stool antigen -start BID pantoprazole  -will message GI [facility] to see in consultation for potential endoscopy with her weight lost and hx of malignancy -keep NPO  AKI (acute kidney injury) (HCC) - AKI with creatinine of 1.22 with prior of 0.80.  -has received 2 L of NS but remains tachycardic. Will keep on continuous LR overnight  Obesity (BMI 30-39.9) -BMI of 30  Liver lesion -CT A/P on 10/6  indeterminate sub-centimeter hypervascular lesions in the right hepatic lobe. Pt aware an abdomen MRI with and without was recommend to evaluate benign vs metastatic disease.   Hypothyroidism -continue levothyroxine  Primary malignant neoplasm of bronchus of right middle lobe [facility]) - s/p right lower/middle lobectomy, chemoradiation, immunotherapy under observation with oncology      Advance Care Planning:Full  Consults: GI  Family Communication: None at bedside  Severity of Illness: The appropriate patient status for this patient is OBSERVATION. Observation status is judged to  be reasonable and necessary in order to provide the required intensity of service to ensure the patient's safety. The patient's presenting symptoms, physical exam findings, and initial radiographic and laboratory data in the context of their medical condition is felt to place them at decreased risk for further clinical deterioration. Furthermore, it is anticipated that the patient will be medically stable for discharge from the hospital within 2 midnights of admission.   Author: Anselm Jungling, DO 02/24/2023 12:50 AM  For on call review [email or web address].

## 2023-02-24 NOTE — Assessment & Plan Note (Signed)
-  ongoing intermittent epigastric pain for the past month. Has been having low appetite and reports almost 30 lbs weight loss in the past month. Denies any dysphagia or odynophagia. Differential could be gastritis, gastric ulcer, malignancy.  -obtain H.pylori stool antigen -start BID pantoprazole  -will message GI [employer] to see in consultation for potential endoscopy with her weight lost and hx of malignancy -keep NPO

## 2023-02-24 NOTE — Interval H&P Note (Signed)
History and Physical Interval Note:  02/24/2023 1:59 PM  Janice Adams  has presented today for surgery, with the diagnosis of Epigastric abdominal pain with nausea and vomiting, weight loss.  The various methods of treatment have been discussed with the patient and family. After consideration of risks, benefits and other options for treatment, the patient has consented to  Procedure(s): ESOPHAGOGASTRODUODENOSCOPY (EGD) WITH PROPOFOL (N/A) as a surgical intervention.  The patient's history has been reviewed, patient examined, no change in status, stable for surgery.  I have reviewed the patient's chart and labs.  Questions were answered to the patient's satisfaction.     Verlin Dike Teshara Moree

## 2023-02-24 NOTE — Consult Note (Addendum)
Attending physician's note   I have taken a history, reviewed the chart, and examined the patient. I performed a substantive portion of this encounter, including complete performance of at least one of the key components, in conjunction with the APP. I agree with the APP's note, impression, and recommendations with my edits.   67 year old female with medical history as outlined below, to include history of non-small cell lung cancer diagnosed in 05/2019 s/p right middle lobectomy, chemotherapy, radiation, immunotherapy, along with history of HTN, GERD, presenting with episodic epigastric pain, nausea, weight loss.  She has been seen at least 4 times in the ER over the last 3 weeks or so, and also admitted in 07/2022 with abdominal pain.  CBC, liver enzymes, CT at that time were all unremarkable and improved with conservative management as inpatient.  Pain is probably been there for the last year or so, but worsening epigastric pain over the last month or so.  Has reportedly lost 25#.  No overt bleeding.  No change in bowel habits.  Admission evaluation notable for the following: - Normal CBC, liver enzymes, lipase, troponin - BUN/creatinine 14/1.22 --> 8/0.8 today (baseline creatinine ~0.8) - Normal iron indices, B12, folate - CT A/P: 9 mm hypervascular lesion in the anterior right hepatic lobe, possible second smaller hypervascular lesion in the right hepatic lobe.  No duct dilatation.  Otherwise normal GB, pancreas, spleen.  Diverticulosis, no diverticulitis and otherwise normal GI tract.  1) Epigastric pain 2) Nausea 3) Weight loss Given duration of symptoms, multiple ER evaluations, and seemingly progressive nature, will plan on expedited endoscopic evaluation on this admission - EGD today to evaluate for mucosal extraluminal pathology - Additional recommendations pending endoscopic findings - Started on high-dose PPI on admission  4) Hypervascular hepatic lesion - Will need MRI  three-phase liver.  Can hopefully get done on this admission for expedited evaluation, particular given history of malignancy  5) History of non-small cell lung cancer - MRI as above to follow-up on liver findings  6) AKI - Resolved   Ann Held, FACG [phone] office         Referring Provider: Dr. Pola Corn, [facility] Primary Care Physician:  Storm Frisk, MD Primary Gastroenterologist:  Dr. Arlyce Dice previously  Reason for Consultation: Epigastric abdominal pain, weight loss  HPI: Janice Adams is a 67 y.o. female with medical history significant of non-small cell lung cancer s/p right lower/middle lobectomy, chemoradiation, immunotherapy under observation, hypothyroidism, who presented to [facility] with complaints of epigastric pain with nausea and vomiting.   Has been having intermittent epigastric pain for what she tells me has been probably close to a year.  And was evaluated multiple times at urgent care and ED. Pain much worse today and had nausea and vomiting so decided to come in for more evaluation.  She says that the pain just comes on out of nowhere, not necessarily correlated with eating.  Mostly in the upper abdomen.  Says she has never vomited prior to this.  Does not feel like she has heartburn or reflux, but her PCP recently put her on low-dose PPI last month to see if that would help.  She denies dysphagia.  She says she moves her bowels regularly without constipation or diarrhea.  No black or bloody stools.  No NSAID use.  Has lost about 25 pounds recently, it looks like since July.  Says that she eats, but just not a lot because she does not have appetite or  desire to eat.  Labs all look okay except for some mild acute kidney injury.  CT scan abdomen pelvis with contrast:  IMPRESSION: No acute findings within the abdomen or pelvis.   Colonic diverticulosis, without radiographic evidence of diverticulitis.   Small calcified uterine  fibroids.   Indeterminate sub-centimeter hypervascular lesions in the right hepatic lobe. Differential diagnosis includes both benign etiologies and metastatic disease. Recommend abdomen MRI without and with contrast for further characterization.  Never had EGD and colonoscopy.  Past Medical History:  Diagnosis Date   Back pain    Cancer (HCC)    Lung Cancer   GERD (gastroesophageal reflux disease)    Gout    no current problems per patient 05/31/19   History of epigastric pain    comes and goes   Hypertension    no meds   UTI (urinary tract infection) 08/19/2019    Past Surgical History:  Procedure Laterality Date   INTERCOSTAL NERVE BLOCK Right 12/30/2019   Procedure: INTERCOSTAL NERVE BLOCK;  Surgeon: Loreli Slot, MD;  Location: [facility] OR;  Service: Thoracic;  Laterality: Right;   LYMPH NODE DISSECTION Right 12/30/2019   Procedure: LYMPH NODE DISSECTION;  Surgeon: Loreli Slot, MD;  Location: [facility] OR;  Service: Thoracic;  Laterality: Right;   MULTIPLE TOOTH EXTRACTIONS     TONSILLECTOMY     uterine ablation     VIDEO BRONCHOSCOPY N/A 12/30/2019   Procedure: VIDEO BRONCHOSCOPY;  Surgeon: Loreli Slot, MD;  Location: [facility] OR;  Service: Thoracic;  Laterality: N/A;   VIDEO BRONCHOSCOPY WITH ENDOBRONCHIAL ULTRASOUND N/A 06/04/2019   Procedure: VIDEO BRONCHOSCOPY WITH ENDOBRONCHIAL ULTRASOUND;  Surgeon: Luciano Cutter, MD;  Location: [facility] OR;  Service: Thoracic;  Laterality: N/A;   WISDOM TOOTH EXTRACTION      Prior to Admission medications   Medication Sig Start Date End Date Taking? Authorizing Provider  gabapentin (NEURONTIN) 300 MG capsule TAKE 1 CAPSULE(300 MG) BY MOUTH AT BEDTIME 01/26/23  Yes Storm Frisk, MD  levothyroxine (SYNTHROID) 150 MCG tablet Take 1 tablet (150 mcg total) by mouth daily before breakfast. 01/30/23  Yes Storm Frisk, MD  omeprazole (PRILOSEC) 20 MG capsule TAKE 1 CAPSULE BY MOUTH DAILY 01/10/23  Yes Storm Frisk, MD   ondansetron (ZOFRAN-ODT) 4 MG disintegrating tablet Take 1 tablet (4 mg total) by mouth every 8 (eight) hours as needed. 02/20/23  Yes Benjiman Core, MD  VITAMIN D PO Take 1 tablet by mouth daily.   Yes [provider]    Current Facility-Administered Medications  Medication Dose Route Frequency Provider Last Rate Last Admin   enoxaparin (LOVENOX) injection 40 mg  40 mg Subcutaneous Q24H Tu, Ching T, DO   40 mg at 02/24/23 0102   gabapentin (NEURONTIN) capsule 300 mg  300 mg Oral QHS Tu, Ching T, DO       levothyroxine (SYNTHROID) tablet 150 mcg  150 mcg Oral QAC breakfast Tu, Ching T, DO   150 mcg at 02/24/23 0906   morphine (PF) 2 MG/ML injection 2 mg  2 mg Intravenous Q4H PRN Tu, Ching T, DO       ondansetron (ZOFRAN) injection 4 mg  4 mg Intravenous Q6H PRN Tu, Ching T, DO       pantoprazole (PROTONIX) EC tablet 40 mg  40 mg Oral BID Tu, Ching T, DO   40 mg at 02/24/23 5784    Allergies as of 02/23/2023 - Review Complete 02/23/2023  Allergen Reaction Noted   Ivp dye [  iodinated contrast media] Other (See Comments) 07/09/2022    Family History  Problem Relation Age of Onset   Breast cancer Mother    Cancer Mother    Seizures Father    Breast cancer Maternal Grandmother    Breast cancer Daughter    Cancer Daughter    Heart attack Brother     Social History   Socioeconomic History   Marital status: Divorced    Spouse name: Not on file   Number of children: 5   Years of education: Not on file   Highest education level: Not on file  Occupational History   Occupation: Childcare  Tobacco Use   Smoking status: Former    Current packs/day: 0.00    Average packs/day: 0.5 packs/day for 47.0 years (23.5 ttl pk-yrs)    Types: Cigarettes    Start date: 07/06/1972    Quit date: 07/07/2019    Years since quitting: 3.6   Smokeless tobacco: Never  Vaping Use   Vaping status: Never Used  Substance and Sexual Activity   Alcohol use: No    Alcohol/week: 0.0 standard  drinks of alcohol   Drug use: No    Comment: last use Wed 05/28/19   Sexual activity: Not on file  Other Topics Concern   Not on file  Social History Narrative   Not on file   Social Determinants of Health   Financial Resource Strain: Low Risk  (11/09/2022)   Overall Financial Resource Strain (CARDIA)    Difficulty of Paying Living Expenses: Not hard at all  Food Insecurity: No Food Insecurity (11/09/2022)   Hunger Vital Sign    Worried About Running Out of Food in the Last Year: Never true    Ran Out of Food in the Last Year: Never true  Transportation Needs: Unmet Transportation Needs (11/09/2022)   PRAPARE - Administrator, Civil Service (Medical): Yes    Lack of Transportation (Non-Medical): No  Physical Activity: Inactive (11/09/2022)   Exercise Vital Sign    Days of Exercise per Week: 0 days    Minutes of Exercise per Session: 0 min  Stress: No Stress Concern Present (11/09/2022)   [employer] of Occupational Health - Occupational Stress Questionnaire    Feeling of Stress : Not at all  Social Connections: Moderately Isolated (11/09/2022)   Social Connection and Isolation Panel [NHANES]    Frequency of Communication with Friends and Family: More than three times a week    Frequency of Social Gatherings with Friends and Family: Three times a week    Attends Religious Services: 1 to 4 times per year    Active Member of Clubs or Organizations: No    Attends Banker Meetings: Never    Marital Status: Divorced  Catering manager Violence: Not At Risk (11/09/2022)   Humiliation, Afraid, Rape, and Kick questionnaire    Fear of Current or Ex-Partner: No    Emotionally Abused: No    Physically Abused: No    Sexually Abused: No    Review of Systems: ROS is O/W negative except as mentioned in HPI.  Physical Exam: Vital signs in last 24 hours: Temp:  [97.6 F (36.4 C)-98.5 F (36.9 C)] 98.1 F (36.7 C) (10/18 0829) Pulse Rate:  [32-122] 93 (10/18  0829) Resp:  [12-30] 18 (10/18 0316) BP: (101-171)/(77-113) 171/108 (10/18 0829) SpO2:  [93 %-100 %] 98 % (10/18 0829) Weight:  [79.4 kg] 79.4 kg (10/17 1503) Last BM Date : 02/24/23 General:  Alert,  Well-developed, well-nourished, pleasant and cooperative in NAD Head:  Normocephalic and atraumatic. Eyes:  Sclera clear, no icterus  Conjunctiva pink. Ears:  Normal auditory acuity. Mouth:  No deformity or lesions.   Lungs:  Clear throughout to auscultation.  No wheezes, crackles, or rhonchi.  Heart:  Regular rate and rhythm; no murmurs, clicks, rubs, or gallops. Abdomen:  Soft, non-distended.  BS present.  Diffuse TTP > in the epigastrium. Msk:  Symmetrical without gross deformities. Pulses:  Normal pulses noted. Extremities:  Without clubbing or edema. Neurologic:  Alert and oriented x 4;  grossly normal neurologically. Skin:  Intact without significant lesions or rashes. Psych:  Alert and cooperative. Normal mood and affect.  Intake/Output from previous day: 10/17 0701 - 10/18 0700 In: 2000 [IV Piggyback:2000] Out: -   Lab Results: Recent Labs    02/23/23 1606  WBC 5.1  HGB 14.4  HCT 43.2  PLT 302   BMET Recent Labs    02/23/23 1606  NA 139  K 3.7  CL 103  CO2 26  GLUCOSE 101*  BUN 14  CREATININE 1.22*  CALCIUM 10.2   LFT Recent Labs    02/23/23 1606  PROT 7.6  ALBUMIN 4.0  AST 20  ALT 10  ALKPHOS 60  BILITOT 1.1   IMPRESSION:  *Epigastric abdominal pain with nausea and vomiting and 25 pound weight loss since July. *History of non-small cell lung cancer status post lobectomy, chemoradiation, immunotherapy under observation.  Has indeterminate hypervascular lesions in the right hepatic lobe on her CT scan question benign versus metastatic disease.  PLAN: -Has been started on pantoprazole 40 mg twice daily.  Continue. -EGD later today. -Will need colonoscopy at some point, likely can be outpatient. -Will need MRI for evaluation of the liver  lesions.  Princella Pellegrini. Zehr  02/24/2023, 11:25 AM

## 2023-02-24 NOTE — H&P (View-Only) (Signed)
Attending physician's note   I have taken a history, reviewed the chart, and examined the patient. I performed a substantive portion of this encounter, including complete performance of at least one of the key components, in conjunction with the APP. I agree with the APP's note, impression, and recommendations with my edits.   67 year old female with medical history as outlined below, to include history of non-small cell lung cancer diagnosed in 05/2019 s/p right middle lobectomy, chemotherapy, radiation, immunotherapy, along with history of HTN, GERD, presenting with episodic epigastric pain, nausea, weight loss.  She has been seen at least 4 times in the ER over the last 3 weeks or so, and also admitted in 07/2022 with abdominal pain.  CBC, liver enzymes, CT at that time were all unremarkable and improved with conservative management as inpatient.  Pain is probably been there for the last year or so, but worsening epigastric pain over the last month or so.  Has reportedly lost 25#.  No overt bleeding.  No change in bowel habits.  Admission evaluation notable for the following: - Normal CBC, liver enzymes, lipase, troponin - BUN/creatinine 14/1.22 --> 8/0.8 today (baseline creatinine ~0.8) - Normal iron indices, B12, folate - CT A/P: 9 mm hypervascular lesion in the anterior right hepatic lobe, possible second smaller hypervascular lesion in the right hepatic lobe.  No duct dilatation.  Otherwise normal GB, pancreas, spleen.  Diverticulosis, no diverticulitis and otherwise normal GI tract.  1) Epigastric pain 2) Nausea 3) Weight loss Given duration of symptoms, multiple ER evaluations, and seemingly progressive nature, will plan on expedited endoscopic evaluation on this admission - EGD today to evaluate for mucosal extraluminal pathology - Additional recommendations pending endoscopic findings - Started on high-dose PPI on admission  4) Hypervascular hepatic lesion - Will need MRI  three-phase liver.  Can hopefully get done on this admission for expedited evaluation, particular given history of malignancy  5) History of non-small cell lung cancer - MRI as above to follow-up on liver findings  6) AKI - Resolved   Ann Held, FACG [phone] office         Referring Provider: Dr. Pola Corn, [facility] Primary Care Physician:  Storm Frisk, MD Primary Gastroenterologist:  Dr. Arlyce Dice previously  Reason for Consultation: Epigastric abdominal pain, weight loss  HPI: Janice Adams is a 67 y.o. female with medical history significant of non-small cell lung cancer s/p right lower/middle lobectomy, chemoradiation, immunotherapy under observation, hypothyroidism, who presented to [facility] with complaints of epigastric pain with nausea and vomiting.   Has been having intermittent epigastric pain for what she tells me has been probably close to a year.  And was evaluated multiple times at urgent care and ED. Pain much worse today and had nausea and vomiting so decided to come in for more evaluation.  She says that the pain just comes on out of nowhere, not necessarily correlated with eating.  Mostly in the upper abdomen.  Says she has never vomited prior to this.  Does not feel like she has heartburn or reflux, but her PCP recently put her on low-dose PPI last month to see if that would help.  She denies dysphagia.  She says she moves her bowels regularly without constipation or diarrhea.  No black or bloody stools.  No NSAID use.  Has lost about 25 pounds recently, it looks like since July.  Says that she eats, but just not a lot because she does not have appetite or  desire to eat.  Labs all look okay except for some mild acute kidney injury.  CT scan abdomen pelvis with contrast:  IMPRESSION: No acute findings within the abdomen or pelvis.   Colonic diverticulosis, without radiographic evidence of diverticulitis.   Small calcified uterine  fibroids.   Indeterminate sub-centimeter hypervascular lesions in the right hepatic lobe. Differential diagnosis includes both benign etiologies and metastatic disease. Recommend abdomen MRI without and with contrast for further characterization.  Never had EGD and colonoscopy.  Past Medical History:  Diagnosis Date   Back pain    Cancer (HCC)    Lung Cancer   GERD (gastroesophageal reflux disease)    Gout    no current problems per patient 05/31/19   History of epigastric pain    comes and goes   Hypertension    no meds   UTI (urinary tract infection) 08/19/2019    Past Surgical History:  Procedure Laterality Date   INTERCOSTAL NERVE BLOCK Right 12/30/2019   Procedure: INTERCOSTAL NERVE BLOCK;  Surgeon: Loreli Slot, MD;  Location: [facility] OR;  Service: Thoracic;  Laterality: Right;   LYMPH NODE DISSECTION Right 12/30/2019   Procedure: LYMPH NODE DISSECTION;  Surgeon: Loreli Slot, MD;  Location: [facility] OR;  Service: Thoracic;  Laterality: Right;   MULTIPLE TOOTH EXTRACTIONS     TONSILLECTOMY     uterine ablation     VIDEO BRONCHOSCOPY N/A 12/30/2019   Procedure: VIDEO BRONCHOSCOPY;  Surgeon: Loreli Slot, MD;  Location: [facility] OR;  Service: Thoracic;  Laterality: N/A;   VIDEO BRONCHOSCOPY WITH ENDOBRONCHIAL ULTRASOUND N/A 06/04/2019   Procedure: VIDEO BRONCHOSCOPY WITH ENDOBRONCHIAL ULTRASOUND;  Surgeon: Luciano Cutter, MD;  Location: [facility] OR;  Service: Thoracic;  Laterality: N/A;   WISDOM TOOTH EXTRACTION      Prior to Admission medications   Medication Sig Start Date End Date Taking? Authorizing Provider  gabapentin (NEURONTIN) 300 MG capsule TAKE 1 CAPSULE(300 MG) BY MOUTH AT BEDTIME 01/26/23  Yes Storm Frisk, MD  levothyroxine (SYNTHROID) 150 MCG tablet Take 1 tablet (150 mcg total) by mouth daily before breakfast. 01/30/23  Yes Storm Frisk, MD  omeprazole (PRILOSEC) 20 MG capsule TAKE 1 CAPSULE BY MOUTH DAILY 01/10/23  Yes Storm Frisk, MD   ondansetron (ZOFRAN-ODT) 4 MG disintegrating tablet Take 1 tablet (4 mg total) by mouth every 8 (eight) hours as needed. 02/20/23  Yes Benjiman Core, MD  VITAMIN D PO Take 1 tablet by mouth daily.   Yes [provider]    Current Facility-Administered Medications  Medication Dose Route Frequency Provider Last Rate Last Admin   enoxaparin (LOVENOX) injection 40 mg  40 mg Subcutaneous Q24H Tu, Ching T, DO   40 mg at 02/24/23 0102   gabapentin (NEURONTIN) capsule 300 mg  300 mg Oral QHS Tu, Ching T, DO       levothyroxine (SYNTHROID) tablet 150 mcg  150 mcg Oral QAC breakfast Tu, Ching T, DO   150 mcg at 02/24/23 0906   morphine (PF) 2 MG/ML injection 2 mg  2 mg Intravenous Q4H PRN Tu, Ching T, DO       ondansetron (ZOFRAN) injection 4 mg  4 mg Intravenous Q6H PRN Tu, Ching T, DO       pantoprazole (PROTONIX) EC tablet 40 mg  40 mg Oral BID Tu, Ching T, DO   40 mg at 02/24/23 5784    Allergies as of 02/23/2023 - Review Complete 02/23/2023  Allergen Reaction Noted   Ivp dye [  iodinated contrast media] Other (See Comments) 07/09/2022    Family History  Problem Relation Age of Onset   Breast cancer Mother    Cancer Mother    Seizures Father    Breast cancer Maternal Grandmother    Breast cancer Daughter    Cancer Daughter    Heart attack Brother     Social History   Socioeconomic History   Marital status: Divorced    Spouse name: Not on file   Number of children: 5   Years of education: Not on file   Highest education level: Not on file  Occupational History   Occupation: Childcare  Tobacco Use   Smoking status: Former    Current packs/day: 0.00    Average packs/day: 0.5 packs/day for 47.0 years (23.5 ttl pk-yrs)    Types: Cigarettes    Start date: 07/06/1972    Quit date: 07/07/2019    Years since quitting: 3.6   Smokeless tobacco: Never  Vaping Use   Vaping status: Never Used  Substance and Sexual Activity   Alcohol use: No    Alcohol/week: 0.0 standard  drinks of alcohol   Drug use: No    Comment: last use Wed 05/28/19   Sexual activity: Not on file  Other Topics Concern   Not on file  Social History Narrative   Not on file   Social Determinants of Health   Financial Resource Strain: Low Risk  (11/09/2022)   Overall Financial Resource Strain (CARDIA)    Difficulty of Paying Living Expenses: Not hard at all  Food Insecurity: No Food Insecurity (11/09/2022)   Hunger Vital Sign    Worried About Running Out of Food in the Last Year: Never true    Ran Out of Food in the Last Year: Never true  Transportation Needs: Unmet Transportation Needs (11/09/2022)   PRAPARE - Administrator, Civil Service (Medical): Yes    Lack of Transportation (Non-Medical): No  Physical Activity: Inactive (11/09/2022)   Exercise Vital Sign    Days of Exercise per Week: 0 days    Minutes of Exercise per Session: 0 min  Stress: No Stress Concern Present (11/09/2022)   [employer] of Occupational Health - Occupational Stress Questionnaire    Feeling of Stress : Not at all  Social Connections: Moderately Isolated (11/09/2022)   Social Connection and Isolation Panel [NHANES]    Frequency of Communication with Friends and Family: More than three times a week    Frequency of Social Gatherings with Friends and Family: Three times a week    Attends Religious Services: 1 to 4 times per year    Active Member of Clubs or Organizations: No    Attends Banker Meetings: Never    Marital Status: Divorced  Catering manager Violence: Not At Risk (11/09/2022)   Humiliation, Afraid, Rape, and Kick questionnaire    Fear of Current or Ex-Partner: No    Emotionally Abused: No    Physically Abused: No    Sexually Abused: No    Review of Systems: ROS is O/W negative except as mentioned in HPI.  Physical Exam: Vital signs in last 24 hours: Temp:  [97.6 F (36.4 C)-98.5 F (36.9 C)] 98.1 F (36.7 C) (10/18 0829) Pulse Rate:  [32-122] 93 (10/18  0829) Resp:  [12-30] 18 (10/18 0316) BP: (101-171)/(77-113) 171/108 (10/18 0829) SpO2:  [93 %-100 %] 98 % (10/18 0829) Weight:  [79.4 kg] 79.4 kg (10/17 1503) Last BM Date : 02/24/23 General:  Alert,  Well-developed, well-nourished, pleasant and cooperative in NAD Head:  Normocephalic and atraumatic. Eyes:  Sclera clear, no icterus  Conjunctiva pink. Ears:  Normal auditory acuity. Mouth:  No deformity or lesions.   Lungs:  Clear throughout to auscultation.  No wheezes, crackles, or rhonchi.  Heart:  Regular rate and rhythm; no murmurs, clicks, rubs, or gallops. Abdomen:  Soft, non-distended.  BS present.  Diffuse TTP > in the epigastrium. Msk:  Symmetrical without gross deformities. Pulses:  Normal pulses noted. Extremities:  Without clubbing or edema. Neurologic:  Alert and oriented x 4;  grossly normal neurologically. Skin:  Intact without significant lesions or rashes. Psych:  Alert and cooperative. Normal mood and affect.  Intake/Output from previous day: 10/17 0701 - 10/18 0700 In: 2000 [IV Piggyback:2000] Out: -   Lab Results: Recent Labs    02/23/23 1606  WBC 5.1  HGB 14.4  HCT 43.2  PLT 302   BMET Recent Labs    02/23/23 1606  NA 139  K 3.7  CL 103  CO2 26  GLUCOSE 101*  BUN 14  CREATININE 1.22*  CALCIUM 10.2   LFT Recent Labs    02/23/23 1606  PROT 7.6  ALBUMIN 4.0  AST 20  ALT 10  ALKPHOS 60  BILITOT 1.1   IMPRESSION:  *Epigastric abdominal pain with nausea and vomiting and 25 pound weight loss since July. *History of non-small cell lung cancer status post lobectomy, chemoradiation, immunotherapy under observation.  Has indeterminate hypervascular lesions in the right hepatic lobe on her CT scan question benign versus metastatic disease.  PLAN: -Has been started on pantoprazole 40 mg twice daily.  Continue. -EGD later today. -Will need colonoscopy at some point, likely can be outpatient. -Will need MRI for evaluation of the liver  lesions.  Princella Pellegrini. Zehr  02/24/2023, 11:25 AM

## 2023-02-24 NOTE — Assessment & Plan Note (Signed)
continue levothyroxine

## 2023-02-24 NOTE — Anesthesia Preprocedure Evaluation (Signed)
Anesthesia Evaluation  Patient identified by MRN, date of birth, ID band Patient awake    Reviewed: Allergy & Precautions, H&P , NPO status , Patient's Chart, lab work & pertinent test results  Airway Mallampati: II  TM Distance: >3 FB Neck ROM: Full    Dental no notable dental hx.    Pulmonary former smoker Neoplasm of right middle lobe   Pulmonary exam normal breath sounds clear to auscultation       Cardiovascular hypertension, Normal cardiovascular exam Rhythm:Regular Rate:Normal     Neuro/Psych negative neurological ROS  negative psych ROS   GI/Hepatic Neg liver ROS,GERD  ,,Epigastric pain   Endo/Other  Hypothyroidism    Renal/GU Renal disease  negative genitourinary   Musculoskeletal negative musculoskeletal ROS (+)    Abdominal   Peds negative pediatric ROS (+)  Hematology negative hematology ROS (+)   Anesthesia Other Findings   Reproductive/Obstetrics negative OB ROS                              Anesthesia Physical Anesthesia Plan  ASA: 3  Anesthesia Plan: MAC   Post-op Pain Management:    Induction: Intravenous  PONV Risk Score and Plan: Propofol infusion and Treatment may vary due to age or medical condition  Airway Management Planned: Natural Airway  Additional Equipment:   Intra-op Plan:   Post-operative Plan:   Informed Consent: I have reviewed the patients History and Physical, chart, labs and discussed the procedure including the risks, benefits and alternatives for the proposed anesthesia with the patient or authorized representative who has indicated his/her understanding and acceptance.     Dental advisory given  Plan Discussed with: CRNA  Anesthesia Plan Comments: Janice KitchenAleiah Adams is a 67 y.o. female with PMH significant for NSCLC s/p right lower/middle lobectomy, chemoradiation, immunotherapy under observation; hypothyroidism, GERD)          Anesthesia Quick Evaluation

## 2023-02-24 NOTE — Assessment & Plan Note (Signed)
-   AKI with creatinine of 1.22 with prior of 0.80.  -has received 2 L of NS but remains tachycardic. Will keep on continuous LR overnight

## 2023-02-24 NOTE — Plan of Care (Signed)
CHL Tonsillectomy/Adenoidectomy, Postoperative PEDS care plan entered in error.

## 2023-02-24 NOTE — Progress Notes (Signed)
PROGRESS NOTE  Janice Adams  DOB: 26-Oct-1955  PCP: Storm Frisk, MD LKG:[id number]  DOA: 02/23/2023  LOS: 0 days  Hospital Day: 2  Brief narrative: Janice Adams is a 67 y.o. female with PMH significant for NSCLC s/p right lower/middle lobectomy, chemoradiation, immunotherapy under observation; hypothyroidism, GERD  10/17, patient was brought to the ED from home for lightheadedness upon standing, sharp central abdominal pain. Patient has ongoing epigastric pain since September and was evaluated multiple times at urgent care and ED. Last had CT abd/pelvis on 02/12/23 with no acute findings of the abdomen or pelvis. There was an indeterminate sub-centimeter hypervascular lesions in the right hepatic lobe and abdomen MRI with and without was recommend to evaluate benign vs metastatic disease.    Pain was much worse today with nausea and vomiting so decided to come in. Pain is sharp in epigastric region and has been intermittent but increasing in frequency. Unsure if it correlates with food but has not been eating well. Has reported weight loss in the past month. Thinks she went from 200lbs to 174lbs. Denies any dysphagia or odynophagia.No diarrhea or constipation. No dark stools or bleeding. She is on omeprazole daily. Denies NSAIDS or alcohol use. No recent travel.    On arrival to ED, presented with tachycardia but was normotensive and afebrile.  CBC unremarkable.  BMP notable for AKI with creatinine of 1.22 with prior of 0.80.    Orthostatic vital signs were possible with sitting SBP at 160 and standing at 149.  She was giving GI cocktail and 2L of IV NS bolus  Admitted to [facility]  Subjective: Patient was seen and examined this morning.  Pleasant elderly African-American female.  Lying down in bed.  Not in distress.  Abdominal pain remains.  GI consulted. Chart reviewed Afebrile, blood pressure elevated to 170s this morning  Assessment and plan: Persistent epigastric  pain ongoing intermittent epigastric pain for the past month. Has been having low appetite and reports almost 30 lbs weight loss in the past month. Denies any dysphagia or odynophagia. Differential could be gastritis, gastric ulcer, malignancy.  obtain H.pylori stool antigen Continue BID pantoprazole  [facility] [place] has been consulted for potential endoscopy with her weight lost and hx of malignancy Remains NPO Noted a plan of EGD this afternoon.  Liver lesions CT A/P on 10/6  indeterminate sub-centimeter hypervascular lesions in the right hepatic lobe. Pt aware an abdomen MRI with and without was recommend to evaluate benign vs metastatic disease.  I ordered for MRI today.  AKI Baseline creatinine 0.8.  Presented with creatinine elevated 1.22 Continue to monitor Recent Labs    06/26/22 2034 07/09/22 1049 07/09/22 1814 07/10/22 0533 07/11/22 0517 02/09/23 0416 02/12/23 0549 02/20/23 1544 02/23/23 1606 02/24/23 1106  BUN 12 10  --  10 7* 11 13 9 14 8   CREATININE 1.08* 1.24* 1.08* 1.16* 1.10* 0.80 0.73 0.80 1.22* 0.78   Macrocytosis Folic acid and vitamin B12 level are normal. 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 Patient has been advised to make an attempt to improve diet and exercise patterns to aid in weight loss.    Mobility: Encourage ambulation  Goals of  care   Code Status: Full Code     DVT prophylaxis:  enoxaparin (LOVENOX) injection 40 mg Start: 02/24/23 0045   Antimicrobials: None Fluid: None Consultants: GI Family Communication: None at bedside  Status: Observation Level of care:  Telemetry Medical   Patient is from: Home Needs to continue in-hospital care: Pending EGD, pending MRI Anticipated d/c to: Hopefully home in 1 to 2 days    Diet:  Diet Order             Diet NPO time specified Except for: Sips with Meds  Diet effective midnight           Diet NPO time specified Except for: Sips with Meds  Diet effective now                   Scheduled Meds:  [MAR Hold] enoxaparin (LOVENOX) injection  40 mg Subcutaneous Q24H   [MAR Hold] gabapentin  300 mg Oral QHS   [MAR Hold] levothyroxine  150 mcg Oral QAC breakfast   [MAR Hold] pantoprazole  40 mg Oral BID    PRN meds: [MAR Hold]  morphine injection, [MAR Hold] ondansetron (ZOFRAN) IV   Infusions:     Antimicrobials: Anti-infectives (From admission, onward)    None       Objective: Vitals:   02/24/23 0829 02/24/23 1338  BP: (!) 171/108 (!) 166/101  Pulse: 93 93  Resp:  17  Temp: 98.1 F (36.7 C) 97.8 F (36.6 C)  SpO2: 98% 99%    Intake/Output Summary (Last 24 hours) at 02/24/2023 1340 Last data filed at 02/23/2023 2136 Gross per 24 hour  Intake 2000 ml  Output --  Net 2000 ml   Filed Weights   02/23/23 1503  Weight: 79.4 kg   Weight change:  Body mass index is 30.04 kg/m.   Physical Exam: General exam: Pleasant, elderly African-American female. Skin: No rashes, lesions or ulcers. HEENT: Atraumatic, normocephalic, no obvious bleeding Lungs: Clear to auscultation bilaterally CVS: Regular rate and rhythm, no murmur GI/Abd soft, mild to moderate epigastric tenderness, nondistended, bowel sound present CNS: Alert, awake, oriented x 3 Psychiatry: Mood appropriate Extremities: No pedal edema, no calf tenderness  Data Review:  I have personally reviewed the laboratory data and studies available.  F/u labs ordered Unresulted Labs (From admission, onward)     Start     Ordered   02/24/23 0037  H. pylori antigen, stool  Once,   R        02/24/23 0036   02/23/23 1538  Urinalysis, Routine w reflex microscopic -Urine, Clean Catch  Once,   URGENT       Question:  Specimen Source  Answer:  Urine, Clean Catch   02/23/23 1537            Total time  spent in review of labs and imaging, patient evaluation, formulation of plan, documentation and communication with family: 45 minutes  Signed, Lorin Glass, MD [facility] 02/24/2023

## 2023-02-24 NOTE — H&P (Incomplete)
History and Physical    Patient: Janice Adams HQI:[id number] DOB: 10-18-1955 DOA: 02/23/2023 DOS: the patient was seen and examined on 02/24/2023 PCP: Storm Frisk, MD  Patient coming from: Home  Chief Complaint:  Chief Complaint  Patient presents with  . Dizziness   HPI: Janice Adams is a 67 y.o. female with medical history significant of non-small cell lung cancer s/p right   Review of Systems: {ROS_Text:26778} Past Medical History:  Diagnosis Date  . Back pain   . Cancer (HCC)    Lung Cancer  . GERD (gastroesophageal reflux disease)   . Gout    no current problems per patient 05/31/19  . History of epigastric pain    comes and goes  . Hypertension    no meds  . UTI (urinary tract infection) 08/19/2019   Past Surgical History:  Procedure Laterality Date  . INTERCOSTAL NERVE BLOCK Right 12/30/2019   Procedure: INTERCOSTAL NERVE BLOCK;  Surgeon: Loreli Slot, MD;  Location: [facility] OR;  Service: Thoracic;  Laterality: Right;  . LYMPH NODE DISSECTION Right 12/30/2019   Procedure: LYMPH NODE DISSECTION;  Surgeon: Loreli Slot, MD;  Location: [facility] OR;  Service: Thoracic;  Laterality: Right;  . MULTIPLE TOOTH EXTRACTIONS    . TONSILLECTOMY    . uterine ablation    . VIDEO BRONCHOSCOPY N/A 12/30/2019   Procedure: VIDEO BRONCHOSCOPY;  Surgeon: Loreli Slot, MD;  Location: [facility] OR;  Service: Thoracic;  Laterality: N/A;  . VIDEO BRONCHOSCOPY WITH ENDOBRONCHIAL ULTRASOUND N/A 06/04/2019   Procedure: VIDEO BRONCHOSCOPY WITH ENDOBRONCHIAL ULTRASOUND;  Surgeon: Luciano Cutter, MD;  Location: [facility] OR;  Service: Thoracic;  Laterality: N/A;  . WISDOM TOOTH EXTRACTION     Social History:  reports that she quit smoking about 3 years ago. Her smoking use included cigarettes. She started smoking about 50 years ago. She has a 23.5 pack-year smoking history. She has never used smokeless tobacco. She reports that she does not drink alcohol and does not use  drugs.  Allergies  Allergen Reactions  . Ivp Dye [Iodinated Contrast Media] Other (See Comments)    Pt reports makes her hands and feet numb    Family History  Problem Relation Age of Onset  . Breast cancer Mother   . Cancer Mother   . Seizures Father   . Breast cancer Maternal Grandmother   . Breast cancer Daughter   . Cancer Daughter   . Heart attack Brother     Prior to Admission medications   Medication Sig Start Date End Date Taking? Authorizing Provider  gabapentin (NEURONTIN) 300 MG capsule TAKE 1 CAPSULE(300 MG) BY MOUTH AT BEDTIME 01/26/23  Yes Storm Frisk, MD  levothyroxine (SYNTHROID) 150 MCG tablet Take 1 tablet (150 mcg total) by mouth daily before breakfast. 01/30/23  Yes Storm Frisk, MD  omeprazole (PRILOSEC) 20 MG capsule TAKE 1 CAPSULE BY MOUTH DAILY 01/10/23  Yes Storm Frisk, MD  ondansetron (ZOFRAN-ODT) 4 MG disintegrating tablet Take 1 tablet (4 mg total) by mouth every 8 (eight) hours as needed. 02/20/23  Yes Benjiman Core, MD  VITAMIN D PO Take 1 tablet by mouth daily.   Yes [provider]    Physical Exam: Vitals:   02/23/23 2205 02/23/23 2210 02/23/23 2215 02/23/23 2220  BP:      Pulse: 77 67 68 87  Resp: 17 16 18 17   Temp:      TempSrc:      SpO2: 99% 99% 100% 99%  Weight:  Height:       *** Data Reviewed: {Tip this will not be part of the note when signed- Document your independent interpretation of telemetry tracing, EKG, lab, Radiology test or any other diagnostic tests. Add any new diagnostic test ordered today. (Optional):26781} {Results:26384}  Assessment and Plan: No notes have been filed under this hospital service. Service: Hospitalist     Advance Care Planning:   Code Status: Prior ***  Consults: ***  Family Communication: ***  Severity of Illness: {Observation/Inpatient:21159}  Author: Anselm Jungling, DO 02/24/2023 12:00 AM  For on call review [email or web address].

## 2023-02-24 NOTE — ED Notes (Signed)
ED TO INPATIENT HANDOFF REPORT  ED Nurse Name and Phone #: Rodney Booze [phone]  S Name/Age/Gender [name] 67 y.o. female Room/Bed: 035C/035C  Code Status   Code Status: Prior  Home/SNF/Other Home Patient oriented to: self, place, time, and situation Is this baseline? Yes   Triage Complete: Triage complete  Chief Complaint AKI (acute kidney injury) (HCC) [N17.9]  Triage Note PT BIB EMS from home for lightheadedness upon standing and sharp central abdominal pain. Pt denies symptoms at rest. Pt denies CP and SOB.    Allergies Allergies  Allergen Reactions   Ivp Dye [Iodinated Contrast Media] Other (See Comments)    Pt reports makes her hands and feet numb    Level of Care/Admitting Diagnosis ED Disposition     ED Disposition  Admit   Condition  --   Comment  Hospital Area: [facility] [[id number]]  Level of Care: Telemetry Medical [104]  May place patient in observation at [facility] or [facility] if equivalent level of care is available:: No  Covid Evaluation: Asymptomatic - no recent exposure (last 10 days) testing not required  Diagnosis: AKI (acute kidney injury) [facility]) [[id number]]  Admitting Physician: Anselm Jungling [[id number]]  Attending Physician: Anselm Jungling [[id number]]          B Medical/Surgery History Past Medical History:  Diagnosis Date   Back pain    Cancer (HCC)    Lung Cancer   GERD (gastroesophageal reflux disease)    Gout    no current problems per patient 05/31/19   History of epigastric pain    comes and goes   Hypertension    no meds   UTI (urinary tract infection) 08/19/2019   Past Surgical History:  Procedure Laterality Date   INTERCOSTAL NERVE BLOCK Right 12/30/2019   Procedure: INTERCOSTAL NERVE BLOCK;  Surgeon: Loreli Slot, MD;  Location: [facility] OR;  Service: Thoracic;  Laterality: Right;   LYMPH NODE DISSECTION Right 12/30/2019   Procedure: LYMPH NODE DISSECTION;  Surgeon: Loreli Slot, MD;   Location: [facility] OR;  Service: Thoracic;  Laterality: Right;   MULTIPLE TOOTH EXTRACTIONS     TONSILLECTOMY     uterine ablation     VIDEO BRONCHOSCOPY N/A 12/30/2019   Procedure: VIDEO BRONCHOSCOPY;  Surgeon: Loreli Slot, MD;  Location: [facility] OR;  Service: Thoracic;  Laterality: N/A;   VIDEO BRONCHOSCOPY WITH ENDOBRONCHIAL ULTRASOUND N/A 06/04/2019   Procedure: VIDEO BRONCHOSCOPY WITH ENDOBRONCHIAL ULTRASOUND;  Surgeon: Luciano Cutter, MD;  Location: [facility] OR;  Service: Thoracic;  Laterality: N/A;   WISDOM TOOTH EXTRACTION       A IV Location/Drains/Wounds Patient Lines/Drains/Airways Status     Active Line/Drains/Airways     Name Placement date Placement time Site Days   Peripheral IV 02/23/23 22 G 1.75" Right;Anterior Forearm 02/23/23  1620  Forearm  1            Intake/Output Last 24 hours  Intake/Output Summary (Last 24 hours) at 02/24/2023 0035 Last data filed at 02/23/2023 2136 Gross per 24 hour  Intake 2000 ml  Output --  Net 2000 ml    Labs/Imaging Results for orders placed or performed during the hospital encounter of 02/23/23 (from the past 48 hour(s))  CBC     Status: Abnormal   Collection Time: 02/23/23  4:06 PM  Result Value Ref Range   WBC 5.1 4.0 - 10.5 K/uL   RBC 4.19 3.87 - 5.11 MIL/uL   Hemoglobin 14.4 12.0 - 15.0 g/dL  HCT 43.2 36.0 - 46.0 %   MCV 103.1 (H) 80.0 - 100.0 fL   MCH 34.4 (H) 26.0 - 34.0 pg   MCHC 33.3 30.0 - 36.0 g/dL   RDW 82.9 56.2 - 13.0 %   Platelets 302 150 - 400 K/uL   nRBC 0.0 0.0 - 0.2 %    Comment: Performed at [facility], [address], [place], [place] [address]  Comprehensive metabolic panel     Status: Abnormal   Collection Time: 02/23/23  4:06 PM  Result Value Ref Range   Sodium 139 135 - 145 mmol/L   Potassium 3.7 3.5 - 5.1 mmol/L   Chloride 103 98 - 111 mmol/L   CO2 26 22 - 32 mmol/L   Glucose, Bld 101 (H) 70 - 99 mg/dL    Comment: Glucose reference range applies only to samples taken after fasting  for at least 8 hours.   BUN 14 8 - 23 mg/dL   Creatinine, Ser 4.69 (H) 0.44 - 1.00 mg/dL   Calcium 62.9 8.9 - 52.8 mg/dL   Total Protein 7.6 6.5 - 8.1 g/dL   Albumin 4.0 3.5 - 5.0 g/dL   AST 20 15 - 41 U/L   ALT 10 0 - 44 U/L   Alkaline Phosphatase 60 38 - 126 U/L   Total Bilirubin 1.1 0.3 - 1.2 mg/dL   GFR, Estimated 48 (L) >60 mL/min    Comment: (NOTE) Calculated using the CKD-EPI Creatinine Equation (2021)    Anion gap 10 5 - 15    Comment: Performed at [facility], [address] [place], [place] [address]  Lipase, blood     Status: None   Collection Time: 02/23/23  4:06 PM  Result Value Ref Range   Lipase 24 11 - 51 U/L    Comment: Performed at [facility], [address], [place], [place] [address]  Troponin I (High Sensitivity)     Status: None   Collection Time: 02/23/23  4:06 PM  Result Value Ref Range   Troponin I (High Sensitivity) 6 <18 ng/L    Comment: (NOTE) Elevated high sensitivity troponin I (hsTnI) values and significant  changes across serial measurements may suggest ACS but many other  chronic and acute conditions are known to elevate hsTnI results.  Refer to the "Links" section for chest pain algorithms and additional  guidance. Performed at [facility], [address], [place], [place] [address]   Troponin I (High Sensitivity)     Status: None   Collection Time: 02/23/23  5:52 PM  Result Value Ref Range   Troponin I (High Sensitivity) 5 <18 ng/L    Comment: (NOTE) Elevated high sensitivity troponin I (hsTnI) values and significant  changes across serial measurements may suggest ACS but many other  chronic and acute conditions are known to elevate hsTnI results.  Refer to the "Links" section for chest pain algorithms and additional  guidance. Performed at [facility], [address] [place], [place] [address]    No results found.  Pending Labs Unresulted Labs (From admission, onward)     Start     Ordered    02/23/23 1538  Urinalysis, Routine w reflex microscopic -Urine, Clean Catch  Once,   URGENT       Question:  Specimen Source  Answer:  Urine, Clean Catch   02/23/23 1537            Vitals/Pain Today's Vitals  02/23/23 2205 02/23/23 2210 02/23/23 2215 02/23/23 2220  BP:      Pulse: 77 67 68 87  Resp: 17 16 18 17   Temp:      TempSrc:      SpO2: 99% 99% 100% 99%  Weight:      Height:      PainSc:        Isolation Precautions No active isolations  Medications Medications  sodium chloride 0.9 % bolus 1,000 mL (0 mLs Intravenous Stopped 02/23/23 1936)  alum & mag hydroxide-simeth (MAALOX/MYLANTA) 200-200-20 MG/5ML suspension 30 mL (30 mLs Oral Given 02/23/23 1631)    And  lidocaine (XYLOCAINE) 2 % viscous mouth solution 15 mL (15 mLs Oral Given 02/23/23 1631)  sodium chloride 0.9 % bolus 1,000 mL (0 mLs Intravenous Stopped 02/23/23 2136)    Mobility walks     Focused Assessments Cardiac Assessment Handoff:  Cardiac Rhythm: Sinus tachycardia No results found for: "CKTOTAL", "CKMB", "CKMBINDEX", "TROPONINI" No results found for: "DDIMER" Does the Patient currently have chest pain? No    R Recommendations: See Admitting Provider Note  Report given to:   Additional Notes:

## 2023-02-24 NOTE — Op Note (Signed)
[facility] Patient Name: Janice Adams Procedure Date : 02/24/2023 MRN: [id number] Attending MD: Doristine Locks , MD, [id number] Date of Birth: 09-Nov-1955 CSN: [id number] Age: 67 Admit Type: Inpatient Procedure:                Upper GI endoscopy Indications:              Epigastric abdominal pain, Nausea, Weight loss Providers:                Doristine Locks, MD, Fransisca Connors, Melany Guernsey, Technician Referring MD:              Medicines:                Monitored Anesthesia Care Complications:            No immediate complications. Estimated Blood Loss:     Estimated blood loss was minimal. Procedure:                Pre-Anesthesia Assessment:                           - Prior to the procedure, a History and Physical                            was performed, and patient medications and                            allergies were reviewed. The patient's tolerance of                            previous anesthesia was also reviewed. The risks                            and benefits of the procedure and the sedation                            options and risks were discussed with the patient.                            All questions were answered, and informed consent                            was obtained. Prior Anticoagulants: The patient has                            taken no anticoagulant or antiplatelet agents. ASA                            Grade Assessment: III - A patient with severe                            systemic disease. After reviewing the risks and  benefits, the patient was deemed in satisfactory                            condition to undergo the procedure.                           After obtaining informed consent, the endoscope was                            passed under direct vision. Throughout the                            procedure, the patient's blood pressure, pulse, and                             oxygen saturations were monitored continuously. The                            [id number]-[id number] ([id number] Olympus endoscope was introduced                            through the mouth, and advanced to the third part                            of duodenum. The upper GI endoscopy was                            accomplished without difficulty. The patient                            tolerated the procedure well. Scope In: Scope Out: Findings:      The examined esophagus was normal.      The gastroesophageal flap valve was visualized endoscopically and       classified as Hill Grade III (minimal fold, loose to endoscope, hiatal       hernia likely).      Scattered minimal inflammation characterized by erythema was found in       the gastric body and in the gastric antrum. No ulcers, erosions, or       gastric outlet obstuction noted. Biopsies were taken with a cold forceps       for histology. Estimated blood loss was minimal.      The examined duodenum was normal. Impression:               - Normal esophagus.                           - Gastroesophageal flap valve classified as Hill                            Grade III (minimal fold, loose to endoscope, hiatal                            hernia likely).                           -  Scattered, minimal, non-ulcer gastritis. Biopsied.                           - Normal examined duodenum. Recommendation:           - Return patient to hospital ward for ongoing care.                           - Advance diet as tolerated.                           - Continue present medications.                           - Await pathology results.                           - Perform a colonoscopy as an outpatient.                           - MRI Liver today. Procedure Code(s):        --- Professional ---                           [phone], Esophagogastroduodenoscopy, flexible,                            transoral; with biopsy, single or multiple Diagnosis  Code(s):        --- Professional ---                           K29.70, Gastritis, unspecified, without bleeding                           R10.13, Epigastric pain                           R11.0, Nausea                           R63.4, Abnormal weight loss CPT copyright 2022 [employer]. All rights reserved. The codes documented in this report are preliminary and upon coder review may  be revised to meet current compliance requirements. Doristine Locks, MD 02/24/2023 2:17:12 PM Number of Addenda: 0

## 2023-02-24 NOTE — Assessment & Plan Note (Signed)
-  CT A/P on 10/6  indeterminate sub-centimeter hypervascular lesions in the right hepatic lobe. Pt aware an abdomen MRI with and without was recommend to evaluate benign vs metastatic disease.

## 2023-02-24 NOTE — Assessment & Plan Note (Signed)
-   s/p right lower/middle lobectomy, chemoradiation, immunotherapy under observation with oncology

## 2023-02-24 NOTE — Assessment & Plan Note (Signed)
BMI of 30

## 2023-02-24 NOTE — Transfer of Care (Signed)
Immediate Anesthesia Transfer of Care Note  Patient: Marleni Warbington  Procedure(s) Performed: ESOPHAGOGASTRODUODENOSCOPY (EGD) WITH PROPOFOL BIOPSY  Patient Location: PACU  Anesthesia Type:MAC  Level of Consciousness: drowsy  Airway & Oxygen Therapy: Patient Spontanous Breathing  Post-op Assessment: Report given to RN and Post -op Vital signs reviewed and stable  Post vital signs: Reviewed and stable  Last Vitals:  Vitals Value Taken Time  BP 118/106 02/24/23 1420  Temp 36.6 C 02/24/23 1420  Pulse 125 02/24/23 1422  Resp 17 02/24/23 1422  SpO2 98 % 02/24/23 1422  Vitals shown include unfiled device data.  Last Pain:  Vitals:   02/24/23 1420  TempSrc:   PainSc: 0-No pain         Complications: No notable events documented.

## 2023-02-25 DIAGNOSIS — D1803 Hemangioma of intra-abdominal structures: Secondary | ICD-10-CM | POA: Diagnosis not present

## 2023-02-25 DIAGNOSIS — R1013 Epigastric pain: Secondary | ICD-10-CM | POA: Diagnosis not present

## 2023-02-25 DIAGNOSIS — R634 Abnormal weight loss: Secondary | ICD-10-CM | POA: Diagnosis not present

## 2023-02-25 DIAGNOSIS — R42 Dizziness and giddiness: Secondary | ICD-10-CM | POA: Diagnosis not present

## 2023-02-25 DIAGNOSIS — K2970 Gastritis, unspecified, without bleeding: Secondary | ICD-10-CM | POA: Diagnosis not present

## 2023-02-25 MED ORDER — OMEPRAZOLE 20 MG PO CPDR: 20.0000 mg | DELAYED_RELEASE_CAPSULE | Freq: Two times a day (BID) | ORAL | 0 refills | Status: DC

## 2023-02-25 NOTE — Discharge Summary (Addendum)
Physician Discharge Summary  Janice Adams XBM:[id number] DOB: [birth year] DOA: 02/23/2023  PCP: Janice Frisk, MD  Admit date: 02/23/2023 Discharge date: 02/25/2023  Admitted From: Home Discharge disposition: Home  Recommendations at discharge:  Continue Protonix 40 mg twice daily Follow-up with GI as an outpatient for colonoscopy    Brief narrative: Janice Adams is a 67 y.o. female with PMH significant for NSCLC s/p right lower/middle lobectomy, chemoradiation, immunotherapy under observation; hypothyroidism, GERD  10/17, patient was brought to the ED from home for lightheadedness upon standing, sharp central abdominal pain. Patient has ongoing epigastric pain since September and was evaluated multiple times at urgent care and ED. Last had CT abd/pelvis on 02/12/23 with no acute findings of the abdomen or pelvis. There was an indeterminate sub-centimeter hypervascular lesions in the right hepatic lobe and abdomen MRI with and without was recommend to evaluate benign vs metastatic disease.    Pain was much worse today with nausea and vomiting so decided to come in. Pain is sharp in epigastric region and has been intermittent but increasing in frequency. Unsure if it correlates with food but has not been eating well. Has reported weight loss in the past month. Thinks she went from 200lbs to 174lbs. Denies any dysphagia or odynophagia.No diarrhea or constipation. No dark stools or bleeding. She is on omeprazole daily. Denies NSAIDS or alcohol use. No recent travel.    On arrival to ED, presented with tachycardia but was normotensive and afebrile.  CBC unremarkable.  BMP notable for AKI with creatinine of 1.22 with prior of 0.80.    Orthostatic vital signs were possible with sitting SBP at 160 and standing at 149.  She was giving GI cocktail and 2L of IV NS bolus  Admitted to [facility] Seen by GI.  Underwent EGD on 10/18.  Subjective: Patient was seen and examined this morning.   Not in distress. Fells better, ready to go home.   Hospital course: Persistent epigastric pain ongoing intermittent epigastric pain for the past month. Has been having low appetite and reports almost 30 lbs weight loss in the past month. Denies any dysphagia or odynophagia.  GI consultation was obtained.  10/18, EGD showed minimal gastritis.  Biopsies pending. Continue BID pantoprazole  Follow up with [facility] as an outpatient for colonoscopy  Liver lesions CT A/P on 10/6  indeterminate sub-centimeter hypervascular lesions in the right hepatic lobe. Pt aware an abdomen MRI with and without was recommend to evaluate benign vs metastatic disease.  MRI abdomen was obtained which showed benign hemangiomas.  No further evaluation needed.  AKI Baseline creatinine 0.8.  Presented with creatinine elevated 1.22 Improved with hydration. Recent Labs    06/26/22 2034 07/09/22 1049 07/09/22 1814 07/10/22 0533 07/11/22 0517 02/09/23 0416 02/12/23 0549 02/20/23 1544 02/23/23 1606 02/24/23 1106  BUN 12 10  --  10 7* 11 13 9 14 8   CREATININE 1.08* 1.24* 1.08* 1.16* 1.10* 0.80 0.73 0.80 1.22* 0.78   Macrocytosis Folic acid and vitamin B12 level are normal. 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 Patient has been advised to make an attempt to improve diet and exercise patterns to  aid in weight loss.  Goals of care   Code Status: Full Code  Wounds:  -    Discharge Exam:   Vitals:   02/24/23 2052 02/24/23 2330 02/25/23 0429 02/25/23 0803  BP: (!) 139/98 105/82 128/83 (!) 131/95  Pulse: 93 95 94 81  Resp: 17 17 17    Temp: 98.2 F (36.8 C) 98.4 F (36.9 C) 97.8 F (36.6 C) 98.1 F (36.7 C)  TempSrc: Oral Oral  Oral  SpO2: 97% 98% 100% 100%  Weight:      Height:        Body mass index is 30.04 kg/m.   General exam: Pleasant, elderly African-American female. Skin: No rashes, lesions or ulcers. HEENT: Atraumatic, normocephalic, no obvious bleeding Lungs: Clear to auscultation bilaterally CVS: Regular rate and rhythm, no murmur GI/Abd soft, mild epigastric tenderness, nondistended, bowel sound present CNS: Alert, awake, oriented x 3 Psychiatry: Mood appropriate Extremities: No pedal edema, no calf tenderness  Follow ups:    Follow-up Information     Janice Frisk, MD Follow up.   Specialty: Pulmonary Disease Contact information: [address] [place] [address] [phone]                 Discharge Instructions:   Discharge Instructions     Call MD for:  difficulty breathing, headache or visual disturbances   Complete by: As directed    Call MD for:  extreme fatigue   Complete by: As directed    Call MD for:  hives   Complete by: As directed    Call MD for:  persistant dizziness or light-headedness   Complete by: As directed    Call MD for:  persistant nausea and vomiting   Complete by: As directed    Call MD for:  severe uncontrolled pain   Complete by: As directed    Call MD for:  temperature >100.4   Complete by: As directed    Diet general   Complete by: As directed    Discharge instructions   Complete by: As directed    Recommendations at discharge:   Continue Protonix 40 mg twice daily  Follow-up with GI as an outpatient for colonoscopy   General discharge instructions: Follow with  Primary MD Janice Frisk, MD in 7 days  Please request your PCP  to go over your hospital tests, procedures, radiology results at the follow up. Please get your medicines reviewed and adjusted.  Your PCP may decide to repeat certain labs or tests as needed. Do not drive, operate heavy machinery, perform activities at heights, swimming or participation in water activities or provide baby sitting services if your were admitted for syncope or siezures until you have seen by Primary MD or a Neurologist and advised to do so again. [place] Controlled Substance Reporting System database was reviewed. Do not drive,  operate heavy machinery, perform activities at heights, swim, participate in water activities or provide baby-sitting services while on medications for pain, sleep and mood until your outpatient physician has reevaluated you and advised to do so again.  You are strongly recommended to comply with the dose, frequency and duration of prescribed medications. Activity: As tolerated with Full fall precautions use walker/cane & assistance as needed Avoid using any recreational substances like cigarette, tobacco, alcohol, or non-prescribed drug. If you experience worsening of your admission symptoms, develop shortness of breath, life threatening emergency, suicidal or homicidal thoughts you must seek medical attention immediately by calling 911 or calling your MD immediately  if symptoms less severe. You must read complete instructions/literature along with all the possible adverse reactions/side effects for all the medicines you take and that have been prescribed to you. Take any new medicine only after you have completely understood and accepted all the possible adverse reactions/side effects.  Wear Seat belts while driving. You were cared for by a hospitalist during your hospital stay. If you have any questions about your discharge medications or the care you received while you were in the  hospital after you are discharged, you can call the unit and ask to speak with the hospitalist or the covering physician. Once you are discharged, your primary care physician will handle any further medical issues. Please note that NO REFILLS for any discharge medications will be authorized once you are discharged, as it is imperative that you return to your primary care physician (or establish a relationship with a primary care physician if you do not have one).   Increase activity slowly   Complete by: As directed        Discharge Medications:   Allergies as of 02/25/2023       Reactions   Ivp Dye [iodinated Contrast Media] Other (See Comments)   Pt reports makes her hands and feet numb        Medication List     TAKE these medications    gabapentin 300 MG capsule Commonly known as: NEURONTIN TAKE 1 CAPSULE(300 MG) BY MOUTH AT BEDTIME   levothyroxine 150 MCG tablet Commonly known as: SYNTHROID Take 1 tablet (150 mcg total) by mouth daily before breakfast.   omeprazole 20 MG capsule Commonly known as: PRILOSEC Take 1 capsule (20 mg total) by mouth 2 (two) times daily before a meal. What changed:  how much to take how to take this when to take this additional instructions   ondansetron 4 MG disintegrating tablet Commonly known as: ZOFRAN-ODT Take 1 tablet (4 mg total) by mouth every 8 (eight) hours as needed.   VITAMIN D PO Take 1 tablet by mouth daily.         The results of significant diagnostics from this hospitalization (including imaging, microbiology, ancillary and laboratory) are listed below for reference.    Procedures and Diagnostic Studies:   MR ABDOMEN W WO CONTRAST  Result Date: 02/25/2023 CLINICAL DATA:  Indeterminate liver lesions on recent CT. Lung carcinoma. EXAM: MRI ABDOMEN WITHOUT AND WITH CONTRAST TECHNIQUE: Multiplanar multisequence MR imaging of the abdomen was performed both before and after the administration of intravenous  contrast. CONTRAST:  8mL GADAVIST GADOBUTROL 1 MMOL/ML IV SOLN COMPARISON:  CT on 02/12/2023 FINDINGS: Lower chest: No acute findings. Hepatobiliary: A 9 mm benign flash-filling hemangioma is seen in the anterior right hepatic lobe, which corresponds with the mass seen on recent CT. No other hepatic masses identified. Gallstones are seen, however there is  no evidence of cholecystitis or biliary dilatation. Pancreas:  No mass or inflammatory changes. Spleen:  Within normal limits in size and appearance. Adrenals/Urinary Tract: Normal adrenal glands. No suspicious renal masses identified. No evidence of hydronephrosis. Stomach/Bowel: Unremarkable. Vascular/Lymphatic: No pathologically enlarged lymph nodes identified. No acute vascular findings. Other:  None. Musculoskeletal:  No suspicious bone lesions identified. IMPRESSION: Small benign hemangioma in the right hepatic lobe, which corresponds with the mass seen on recent CT. No evidence of hepatic metastatic disease or other acute findings. Cholelithiasis. No radiographic evidence of cholecystitis or biliary dilatation. Electronically Signed   By: Danae Orleans M.D.   On: 02/25/2023 09:27     Labs:   Basic Metabolic Panel: Recent Labs  Lab 02/20/23 1544 02/23/23 1606 02/24/23 1106  NA 137 139 138  K 4.0 3.7 3.8  CL 102 103 104  CO2 25 26 22   GLUCOSE 101* 101* 80  BUN 9 14 8   CREATININE 0.80 1.22* 0.78  CALCIUM 9.7 10.2 9.0   GFR Estimated Creatinine Clearance: 68.6 mL/min (by C-G formula based on SCr of 0.78 mg/dL). Liver Function Tests: Recent Labs  Lab 02/20/23 1544 02/23/23 1606  AST 13* 20  ALT 5 10  ALKPHOS 60 60  BILITOT 1.0 1.1  PROT 7.9 7.6  ALBUMIN 4.5 4.0   Recent Labs  Lab 02/20/23 1544 02/23/23 1606  LIPASE 17 24   No results for input(s): "AMMONIA" in the last 168 hours. Coagulation profile No results for input(s): "INR", "PROTIME" in the last 168 hours.  CBC: Recent Labs  Lab 02/20/23 1544 02/23/23 1606   WBC 3.8* 5.1  HGB 13.9 14.4  HCT 41.0 43.2  MCV 100.5* 103.1*  PLT 307 302   Cardiac Enzymes: No results for input(s): "CKTOTAL", "CKMB", "CKMBINDEX", "TROPONINI" in the last 168 hours. BNP: Invalid input(s): "POCBNP" CBG: No results for input(s): "GLUCAP" in the last 168 hours. D-Dimer No results for input(s): "DDIMER" in the last 72 hours. Hgb A1c No results for input(s): "HGBA1C" in the last 72 hours. Lipid Profile No results for input(s): "CHOL", "HDL", "LDLCALC", "TRIG", "CHOLHDL", "LDLDIRECT" in the last 72 hours. Thyroid function studies No results for input(s): "TSH", "T4TOTAL", "T3FREE", "THYROIDAB" in the last 72 hours.  Invalid input(s): "FREET3" Anemia work up Recent Labs    02/24/23 1106  VITAMINB12 346  FOLATE 12.9  FERRITIN 55  TIBC 356  IRON 110  RETICCTPCT 1.4   Microbiology No results found for this or any previous visit (from the past 240 hour(s)).  Time coordinating discharge: 45 minutes  Signed: Arthuro Canelo  [facility] 02/25/2023, 11:18 AM

## 2023-02-25 NOTE — Progress Notes (Addendum)
     Attending physician's note   I have taken a history, reviewed the chart, and examined the patient. I performed a substantive portion of this encounter, including complete performance of at least one of the key components, in conjunction with the APP. I agree with the APP's note, impression, and recommendations with my edits.   Trulee Hamstra, DO, FACG [phone] office         [facility] Progress Note  CC:  Epigastric abdominal pain, weight loss   Subjective:  Feels good.  Tolerated breakfast well.  Would like to go home.  Objective:  Vital signs in last 24 hours: Temp:  [97.8 F (36.6 C)-98.4 F (36.9 C)] 98.1 F (36.7 C) (10/19 0803) Pulse Rate:  [81-123] 81 (10/19 0803) Resp:  [15-21] 17 (10/19 0429) BP: (105-179)/(82-110) 131/95 (10/19 0803) SpO2:  [95 %-100 %] 100 % (10/19 0803) Last BM Date : 02/24/23 General:  Alert, Well-developed, in NAD Heart:  Regular rate and rhythm; no murmurs Pulm:  CTAB.  No W/R/R. Abdomen:  Soft, non-distended.  BS present.  Minimal TTP. Extremities:  Without edema. Neurologic:  Alert and  oriented x4;  grossly normal neurologically. Psych:  Alert and cooperative. Normal mood and affect.  Intake/Output from previous day: 10/18 0701 - 10/19 0700 In: 600 [I.V.:600] Out: -   Lab Results: Recent Labs    02/23/23 1606  WBC 5.1  HGB 14.4  HCT 43.2  PLT 302   BMET Recent Labs    02/23/23 1606 02/24/23 1106  NA 139 138  K 3.7 3.8  CL 103 104  CO2 26 22  GLUCOSE 101* 80  BUN 14 8  CREATININE 1.22* 0.78  CALCIUM 10.2 9.0   LFT Recent Labs    02/23/23 1606  PROT 7.6  ALBUMIN 4.0  AST 20  ALT 10  ALKPHOS 60  BILITOT 1.1   Assessment / Plan: *Epigastric abdominal pain with nausea and vomiting and 25 pound weight loss since July.  EGD 02/24/2023 showed minimal gastritis, biopsies pending. *History of non-small cell lung cancer status post lobectomy, chemoradiation, immunotherapy under  observation.  Has indeterminate hypervascular lesions in the right hepatic lobe on her CT scan question benign versus metastatic disease.  MRI liver showed benign hemangiomas.  -Ok for discharge.  We will follow-up biopsy results and will need to have an outpatient colonoscopy in the near future.  Continue pantoprazole 40 mg BID for now and we can see if that helps with her symptoms.   LOS: 0 days   Princella Pellegrini. Zehr  02/25/2023, 9:04 AM

## 2023-02-25 NOTE — Care Management Obs Status (Signed)
MEDICARE OBSERVATION STATUS NOTIFICATION   Patient Details  Name: Janice Adams MRN: [id number] Date of Birth: 09-25-1955   Medicare Observation Status Notification Given:  Yes    Lawerance Sabal, RN 02/25/2023, 8:46 AM

## 2023-02-27 ENCOUNTER — Encounter (HOSPITAL_COMMUNITY): Payer: Self-pay | Admitting: Gastroenterology

## 2023-02-27 ENCOUNTER — Ambulatory Visit: Payer: Self-pay | Admitting: Licensed Clinical Social Worker

## 2023-02-27 LAB — SURGICAL PATHOLOGY

## 2023-02-27 NOTE — Patient Outreach (Signed)
Care Coordination   Follow Up Visit Note   02/27/2023 Name: Oleeta Lovejoy MRN: [id number] DOB: 1955/05/20  Lorrain Duru is a 67 y.o. year old female who sees Storm Frisk, MD for primary care. I spoke with  [name] by phone today.  What matters to the patients health and wellness today?  Transportation    Goals Addressed             This Visit's Progress    Obtain supportive resources-Housing   On track    Activities and task to complete in order to accomplish goals.   Keep all upcoming appointments discussed today Continue with compliance of taking medication prescribed by Doctor Implement healthy coping skills discussed to assist with management of symptoms Continue working with [facility] care team to assist with goals identified         SDOH assessments and interventions completed:  Yes  SDOH Interventions Today    Flowsheet Row Most Recent Value  SDOH Interventions   Transportation Interventions Other (Comment)  [Contacted PCP office, who agreed to provide transportation]        Care Coordination Interventions:  Yes, provided  Interventions Today    Flowsheet Row Most Recent Value  General Interventions   General Interventions Discussed/Reviewed General Interventions Reviewed, Doctor Visits, [facility], Communication with  [Discussed recommendations from ED visits. Patient is prioritizing increase in fluid intake and taking medications as prescribed. Identified transportation barrier and LCSW will reach out to PCP office about upcoming appt]  Doctor Visits Discussed/Reviewed Doctor Visits Reviewed  Communication with RN  [facility] collaborated with a RNCM with [facility] regarding transportation needs]  Mental Health Interventions   Mental Health Discussed/Reviewed Mental Health Reviewed, Coping Strategies  Nutrition Interventions   Nutrition Discussed/Reviewed Nutrition Reviewed, Fluid intake  Pharmacy Interventions   Pharmacy Dicussed/Reviewed  Pharmacy Topics Reviewed, Medication Adherence       Follow up plan: Follow up call scheduled for 2-4 weeks    Encounter Outcome:  Patient Visit Completed   Jenel Lucks, MSW, LCSW [facility] Care Management [facility]  [email or web address] Phone [phone] 5:20 PM

## 2023-02-27 NOTE — Anesthesia Postprocedure Evaluation (Addendum)
Anesthesia Post Note  Patient: Janice Adams  Procedure(s) Performed: ESOPHAGOGASTRODUODENOSCOPY (EGD) WITH PROPOFOL BIOPSY     Patient location during evaluation: PACU Anesthesia Type: MAC Level of consciousness: awake and alert Pain management: pain level controlled Vital Signs Assessment: post-procedure vital signs reviewed and stable Respiratory status: spontaneous breathing, nonlabored ventilation, respiratory function stable and patient connected to nasal cannula oxygen Cardiovascular status: stable and blood pressure returned to baseline Postop Assessment: no apparent nausea or vomiting Anesthetic complications: no   No notable events documented.  Last Vitals:  Vitals:   02/25/23 0429 02/25/23 0803  BP: 128/83 (!) 131/95  Pulse: 94 81  Resp: 17   Temp: 36.6 C 36.7 C  SpO2: 100% 100%    Last Pain:  Vitals:   02/25/23 0803  TempSrc: Oral  PainSc:                  [place]

## 2023-02-27 NOTE — Patient Instructions (Signed)
Visit Information  Thank you for taking time to visit with me today. Please don't hesitate to contact me if I can be of assistance to you.   Following are the goals we discussed today:   Goals Addressed             This Visit's Progress    Obtain supportive resources-Housing   On track    Activities and task to complete in order to accomplish goals.   Keep all upcoming appointments discussed today Continue with compliance of taking medication prescribed by Doctor Implement healthy coping skills discussed to assist with management of symptoms Continue working with [facility] care team to assist with goals identified         Our next appointment is by telephone on 11/4 at 10 AM  Please call the care guide team at [phone] if you need to cancel or reschedule your appointment.   If you are experiencing a Mental Health or Behavioral Health Crisis or need someone to talk to, please call the Suicide and Crisis Lifeline: 988 call 911   The patient verbalized understanding of instructions, educational materials, and care plan provided today and DECLINED offer to receive copy of patient instructions, educational materials, and care plan.   Jenel Lucks, MSW, LCSW [facility] Care Management [place]  [facility] [email or web address] Phone [phone] 5:21 PM

## 2023-02-28 NOTE — Progress Notes (Unsigned)
Established Patient Office Visit  Subjective   Patient ID: Estephanie Ting, female    DOB: August 21, 1955  Age: 67 y.o. MRN: [id number]  No chief complaint on file.   07/2022 67 y.o.F not seen since 03/2021 by Delford Field Patient's had a recent admission earlier this month for abdominal pain.  Note she has a prior history of lung cancer status post resection reflux disease hypertension.  She was admitted with volume depletion abdominal pain got IV fluids CT scan of the abdomen unremarkable.  She was thought to have a urinary tract infection however urine culture was negative.  She has hypothyroidism and has significant elevations in TSH has not taken her Synthroid.  She has not had a colonoscopy and needs follow-up gastroenterology also to assess her upper tract.  Below is a copy of the discharge summary.  Admit date: 07/09/2022 Discharge date: 07/11/2022   Time spent: 35 minutes.   Recommendations for Outpatient Follow-up:  1. Follow-up with your primary care provider. 2. Obtain GI referral from your PCP. 3. Take your medications as prescribed.   Discharge Diagnoses:  Active Hospital Problems   Diagnosis Date Noted  Abdominal pain 07/09/2022  Volume depletion 07/10/2022  Hypercalcemia 07/09/2022  Hypothyroidism 04/06/2021  UTI (urinary tract infection) 08/19/2019  GERD (gastroesophageal reflux disease) 11/08/2012   Resolved Hospital Problems No resolved problems to display.     Discharge Condition: Stable   Diet recommendation: Resume previous diet.   Vitals:   07/10/22 2030 07/11/22 0504 BP: 122/86 122/87 Pulse: 91 94 Resp: 18 18 Temp: 97.9 F (36.6 C) 97.8 F (36.6 C) SpO2: 91% 95%     History of present illness:  Patient is a 67 year old African-American female with past medical history significant for lung cancer s/p resection, GERD and hypertension.  Patient was admitted with 4-day history of abdominal pain.  Volume depleted on presentation.  Received IV fluid and  IV PPI with improvement of her symptomatology.  Urinalysis revealed many bacteria, asymptomatic.  Urine culture in process.  Her abdominal pain resolved.  She was able to tolerate a diet.  No recurrent abdominal pain, no nausea or vomiting.   07/11/2022: The patient was seen and examined at bedside.  There were no acute events overnight.  She ate her breakfast without any difficulty.  She has no new complaints and is eager to go home.  Advised to take her medications as prescribed.  The patient was receptive.   Hospital Course:  Principal Problem:   Abdominal pain Active Problems:   GERD (gastroesophageal reflux disease)   UTI (urinary tract infection)   Hypothyroidism   Hypercalcemia   Volume depletion   Resolved, abdominal pain: -History of GERD.   -Patient was off her home PPI for few days, resume. Received IV Protonix.   -Epigastric pain has resolved.   -Continue home PPI  Follow-up with GI primary care provider, obtain GI referral from your PCP.   GERD (gastroesophageal reflux disease): Received IV PPI. -Follow-up with GI on discharge.  Obtain GI referral from your PCP. Resume home omeprazole, 20 mg daily.   Asymptomatic bacteriuria:  -Urine culture, in process.    Resolved, volume depletion: Received IV fluids Euvolemic on exam   Hypothyroidism: Continue home Synthroid. TSH 225, made the patient aware, she stated she was not taking it regularly.  Stated she had plenty of the medication and did not need a refill. Repeat TSH and obtain free T4 level outpatient in 4-6 weeks. Advised on the importance of medication compliance.  The  patient was receptive. Take your medications as prescribed Follow-up with your primary care provider   Medication noncompliance Noncompliant with home thyroid medication Ran out of home omeprazole, refilled, 20 mg daily x 90 days. Advised on the importance of medication compliance, the patient was receptive.   Resolved,  hypercalcemia: Suspect related to dehydration.    DVT prophylaxis: Subcutaneous Lovenox daily. Code Status: Full code. Family Communication:  Disposition Plan: Home eventually.    Note during the last admission she had a CT of the chest showing no recurrence of lung cancer.  It appears that oncology has signed off.  She does have elevation in calcium needs to be followed up.  Note during hospitalization MCV was markedly increased  03/01/23 HFU  Admit date: 02/23/2023 Discharge date: 02/25/2023   Admitted From: Home Discharge disposition: Home   Recommendations at discharge:   Continue Protonix 40 mg twice daily  Follow-up with GI as an outpatient for colonoscopy       Brief narrative: Ajwa Frattini is a 67 y.o. female with PMH significant for NSCLC s/p right lower/middle lobectomy, chemoradiation, immunotherapy under observation; hypothyroidism, GERD   10/17, patient was brought to the ED from home for lightheadedness upon standing, sharp central abdominal pain. Patient has ongoing epigastric pain since September and was evaluated multiple times at urgent care and ED. Last had CT abd/pelvis on 02/12/23 with no acute findings of the abdomen or pelvis. There was an indeterminate sub-centimeter hypervascular lesions in the right hepatic lobe and abdomen MRI with and without was recommend to evaluate benign vs metastatic disease.    Pain was much worse today with nausea and vomiting so decided to come in. Pain is sharp in epigastric region and has been intermittent but increasing in frequency. Unsure if it correlates with food but has not been eating well. Has reported weight loss in the past month. Thinks she went from 200lbs to 174lbs. Denies any dysphagia or odynophagia.No diarrhea or constipation. No dark stools or bleeding. She is on omeprazole daily. Denies NSAIDS or alcohol use. No recent travel.    On arrival to ED, presented with tachycardia but was normotensive and  afebrile.  CBC unremarkable.  BMP notable for AKI with creatinine of 1.22 with prior of 0.80.    Orthostatic vital signs were possible with sitting SBP at 160 and standing at 149.  She was giving GI cocktail and 2L of IV NS bolus  Admitted to [facility] Seen by GI.  Underwent EGD on 10/18.   Subjective: Patient was seen and examined this morning.  Not in distress. Fells better, ready to go home.    Hospital course: Persistent epigastric pain ongoing intermittent epigastric pain for the past month. Has been having low appetite and reports almost 30 lbs weight loss in the past month. Denies any dysphagia or odynophagia.  GI consultation was obtained.  10/18, EGD showed minimal gastritis.  Biopsies pending. Continue BID pantoprazole  Follow up with [facility] as an outpatient for colonoscopy   Liver lesions CT A/P on 10/6  indeterminate sub-centimeter hypervascular lesions in the right hepatic lobe. Pt aware an abdomen MRI with and without was recommend to evaluate benign vs metastatic disease.  MRI abdomen was obtained which showed benign hemangiomas.  No further evaluation needed.   AKI Baseline creatinine 0.8.  Presented with creatinine elevated 1.22 Improved with hydration. Recent Labs (within last 365 days) Recent Labs   06/26/22 2034 07/09/22 1049 07/09/22 1814 07/10/22 0533 07/11/22 4132 02/09/23 0416 02/12/23 0549 02/20/23  1544 02/23/23 1606 02/24/23 1106 BUN 12 10  --  10 7* 11 13 9 14 8  CREATININE 1.08* 1.24* 1.08* 1.16* 1.10* 0.80 0.73 0.80 1.22* 0.78    Macrocytosis Folic acid and vitamin B12 level are normal. 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 Patient has been advised to make an attempt to improve diet and exercise patterns to aid in weight loss.      Review of Systems  Constitutional:  Negative for chills, diaphoresis, fever, malaise/fatigue and weight loss.  HENT:  Negative for congestion, hearing loss, nosebleeds, sore throat and tinnitus.   Eyes:  Negative for blurred vision, photophobia and redness.  Respiratory:  Negative for cough, hemoptysis, sputum production, shortness of breath, wheezing and stridor.   Cardiovascular:  Negative for chest pain, palpitations, orthopnea, claudication, leg swelling and PND.  Gastrointestinal:  Positive for heartburn. Negative for abdominal pain, blood in stool, constipation, diarrhea, nausea and vomiting.  Genitourinary:  Negative for dysuria, flank pain, frequency, hematuria and urgency.  Musculoskeletal:  Negative for back pain, falls, joint pain, myalgias and neck pain.  Skin:  Negative for itching and rash.  Neurological:  Negative for dizziness, tingling, tremors, sensory change, speech change, focal weakness, seizures, loss of consciousness, weakness and headaches.  Endo/Heme/Allergies:  Negative for environmental allergies and polydipsia. Does not bruise/bleed easily.  Psychiatric/Behavioral:  Negative for depression, memory loss, substance abuse and suicidal ideas. The patient is not nervous/anxious and does not have insomnia.       Objective:     LMP  (LMP Unknown)    Physical Exam Vitals reviewed.  Constitutional:      Appearance: Normal appearance. She is well-developed. She is obese. She is not diaphoretic.  HENT:     Head: Normocephalic and  atraumatic.     Nose: Nose normal. No nasal deformity, septal deviation, mucosal edema or rhinorrhea.     Right Sinus: No maxillary sinus tenderness or frontal sinus tenderness.     Left Sinus: No maxillary sinus tenderness or frontal sinus tenderness.     Mouth/Throat:     Mouth: Mucous membranes are moist.     Pharynx: Oropharynx is clear. No oropharyngeal exudate.  Eyes:     General: No scleral icterus.    Conjunctiva/sclera: Conjunctivae normal.     Pupils: Pupils are equal, round, and reactive to light.  Neck:     Thyroid: No thyromegaly.     Vascular: No carotid bruit or JVD.     Trachea: Trachea normal.  No tracheal tenderness or tracheal deviation.  Cardiovascular:     Rate and Rhythm: Normal rate and regular rhythm.     Chest Wall: PMI is not displaced.     Pulses: Normal pulses. No decreased pulses.     Heart sounds: Normal heart sounds, S1 normal and S2 normal. Heart sounds not distant. No murmur heard.    No systolic murmur is present.     No diastolic murmur is present.     No friction rub. No gallop. No S3 or S4 sounds.  Pulmonary:     Effort: Pulmonary effort is normal. No tachypnea, accessory muscle usage or respiratory distress.     Breath sounds: No stridor. Rhonchi present. No decreased breath sounds, wheezing or rales.     Comments: Rhonchorous breath sounds right upper lobe Chest:     Chest wall: No tenderness.  Abdominal:     General: Bowel sounds are normal. There is no distension.     Palpations: Abdomen is soft. Abdomen is not rigid.     Tenderness: There is no abdominal tenderness. There is no guarding or rebound.  Musculoskeletal:        General: Normal range of motion.     Cervical back: Normal range of motion and neck supple. No edema, erythema or rigidity. No muscular tenderness. Normal range of motion.  Lymphadenopathy:     Head:     Right side of head: No submental or submandibular adenopathy.     Left side of head: No submental or submandibular  adenopathy.     Cervical: No cervical adenopathy.  Skin:    General: Skin is warm and dry.     Coloration: Skin is not pale.     Findings: No rash.     Nails: There is no clubbing.  Neurological:     Mental Status: She is alert and oriented to person, place, and time.     Sensory: No sensory deficit.  Psychiatric:        Mood and Affect: Mood normal.        Speech: Speech normal.        Behavior: Behavior normal.        Thought Content: Thought content normal.        Judgment: Judgment normal.     No results found for any visits on 03/01/23.    The 10-year ASCVD risk score (Arnett DK, et al., 2019) is: 12.3%    Assessment & Plan:   Problem List Items Addressed This Visit   None  Mammogram ordered Multiple systems assessed complex decision making high time spent 40 minutes No follow-ups on file.    Shan Levans, MD

## 2023-03-01 ENCOUNTER — Other Ambulatory Visit: Payer: Self-pay | Admitting: Critical Care Medicine

## 2023-03-01 ENCOUNTER — Encounter: Payer: Self-pay | Admitting: Critical Care Medicine

## 2023-03-01 ENCOUNTER — Ambulatory Visit: Payer: 59 | Attending: Critical Care Medicine | Admitting: Critical Care Medicine

## 2023-03-01 ENCOUNTER — Inpatient Hospital Stay: Payer: 59 | Admitting: Physician Assistant

## 2023-03-01 VITALS — BP 131/90 | HR 125 | Temp 97.7°F | Ht 64.0 in | Wt 177.0 lb

## 2023-03-01 DIAGNOSIS — K2950 Unspecified chronic gastritis without bleeding: Secondary | ICD-10-CM | POA: Diagnosis not present

## 2023-03-01 DIAGNOSIS — D7589 Other specified diseases of blood and blood-forming organs: Secondary | ICD-10-CM

## 2023-03-01 DIAGNOSIS — R1013 Epigastric pain: Secondary | ICD-10-CM | POA: Diagnosis not present

## 2023-03-01 DIAGNOSIS — E039 Hypothyroidism, unspecified: Secondary | ICD-10-CM | POA: Diagnosis not present

## 2023-03-01 MED ORDER — PANTOPRAZOLE SODIUM 40 MG PO TBEC: 40.0000 mg | DELAYED_RELEASE_TABLET | Freq: Two times a day (BID) | ORAL | 3 refills | Status: AC

## 2023-03-01 MED ORDER — LEVOTHYROXINE SODIUM 150 MCG PO TABS: 150.0000 ug | ORAL_TABLET | Freq: Every day | ORAL | 2 refills | Status: AC

## 2023-03-01 NOTE — Assessment & Plan Note (Signed)
Increased MCV noted. Will check folate.

## 2023-03-01 NOTE — Patient Instructions (Signed)
Labs today Start pantoprazole one twice daily before meal Stop omeprazole Thyroid medication refilled Referral to gastroenterology made Return for new PCP in 4 months

## 2023-03-01 NOTE — Assessment & Plan Note (Signed)
Hospital follow up for abdominal pain, nausea and vomiting. She reports she is she now only experiencing heart burn. Patient has not picked up pantoprazole. She has been taking omeprazole. Epigastric tenderness to palpation. Will d/c omeprazole. Start pantoprazole 40 mg PO bid before meals. Referral also placed for GI for colonoscopy.

## 2023-03-02 LAB — FOLATE: Folate: 11.5 ng/mL (ref >3.0)

## 2023-03-02 NOTE — Progress Notes (Signed)
Let pt know labs normal

## 2023-03-03 ENCOUNTER — Telehealth: Payer: Self-pay

## 2023-03-03 NOTE — Telephone Encounter (Signed)
Called patient unable to make contact or leave voicemail, information sent to the nurse pool    Letter will be sent

## 2023-03-03 NOTE — Telephone Encounter (Signed)
-----   Message from Shan Levans sent at 03/02/2023  6:03 AM EDT ----- Let pt know labs normal

## 2023-03-10 DIAGNOSIS — 419620001 Death: Secondary | SNOMED CT | POA: Diagnosis not present

## 2023-03-10 DEATH — deceased

## 2023-03-13 ENCOUNTER — Ambulatory Visit: Payer: Self-pay | Admitting: Licensed Clinical Social Worker

## 2023-03-13 NOTE — Patient Outreach (Signed)
  Care Coordination   Follow Up Visit Note   03/13/2023 Name: Janice Adams MRN: [id number] DOB: 18-Feb-1956  Janice Adams is a 67 y.o. year old female who sees Storm Frisk, MD for primary care. I spoke with  [name] by phone today.  What matters to the patients health and wellness today?  Symptom Management    Goals Addressed             This Visit's Progress    Obtain supportive resources-Housing   On track    Activities and task to complete in order to accomplish goals.   Keep all upcoming appointments discussed today Continue with compliance of taking medication prescribed by Doctor Implement healthy coping skills discussed to assist with management of symptoms Continue working with [facility] care team to assist with goals identified         SDOH assessments and interventions completed:  No     Care Coordination Interventions:  Yes, provided  Interventions Today    Flowsheet Row Most Recent Value  Chronic Disease   Chronic disease during today's visit Other  [Epigastric Pain]  General Interventions   General Interventions Discussed/Reviewed General Interventions Reviewed, Doctor Visits, Labs  Labs --  [facility] informed pt that letter has been mailed to inform her of recent labs]  Doctor Visits Discussed/Reviewed Doctor Visits Reviewed  Mental Health Interventions   Mental Health Discussed/Reviewed Mental Health Reviewed, Coping Strategies  Nutrition Interventions   Nutrition Discussed/Reviewed Nutrition Reviewed  Pharmacy Interventions   Pharmacy Dicussed/Reviewed Medication Adherence, Pharmacy Topics Reviewed  [Patient states she will finish prescription of omeprazole. She agreed to pick up pantoprazole from preferred pharmacy]  Safety Interventions   Safety Discussed/Reviewed Safety Reviewed       Follow up plan: Follow up call scheduled for 4-6 weeks    Encounter Outcome:  Patient Visit Completed   Jenel Lucks, MSW, LCSW [facility] Care  Management [facility] Health  [facility] [email or web address] Phone [phone] 10:45 AM

## 2023-03-13 NOTE — Patient Instructions (Signed)
Visit Information  Thank you for taking time to visit with me today. Please don't hesitate to contact me if I can be of assistance to you.   Following are the goals we discussed today:   Goals Addressed             This Visit's Progress    Obtain supportive resources-Housing   On track    Activities and task to complete in order to accomplish goals.   Keep all upcoming appointments discussed today Continue with compliance of taking medication prescribed by Doctor Implement healthy coping skills discussed to assist with management of symptoms Continue working with [facility] care team to assist with goals identified         Our next appointment is by telephone on 12/16 at 10 AM  Please call the care guide team at [phone] if you need to cancel or reschedule your appointment.   If you are experiencing a Mental Health or Behavioral Health Crisis or need someone to talk to, please call the Suicide and Crisis Lifeline: 988 call 911   The patient verbalized understanding of instructions, educational materials, and care plan provided today and DECLINED offer to receive copy of patient instructions, educational materials, and care plan.   Jenel Lucks, MSW, LCSW [facility] Care Management [place]  [facility] [email or web address] Phone [phone] 10:46 AM

## 2023-03-29 ENCOUNTER — Encounter: Payer: Self-pay | Admitting: Internal Medicine

## 2023-03-29 NOTE — Telephone Encounter (Signed)
Telephone call  

## 2023-04-12 ENCOUNTER — Encounter: Payer: Self-pay | Admitting: Critical Care Medicine

## 2023-04-24 ENCOUNTER — Ambulatory Visit: Payer: Self-pay | Admitting: Licensed Clinical Social Worker

## 2023-04-28 NOTE — Patient Instructions (Signed)
Visit Information  Thank you for taking time to visit with me today. Please don't hesitate to contact me if I can be of assistance to you.   Following are the goals we discussed today:   Goals Addressed             This Visit's Progress    Obtain supportive resources-Housing   On track    Activities and task to complete in order to accomplish goals.   Keep all upcoming appointments discussed today Continue with compliance of taking medication prescribed by Doctor Implement healthy coping skills discussed to assist with management of symptoms Continue working with [facility] care team to assist with goals identified         Please call the care guide team at [phone] if you need to cancel or reschedule your appointment.   If you are experiencing a Mental Health or Behavioral Health Crisis or need someone to talk to, please call the Suicide and Crisis Lifeline: 988 call 911   Patient verbalizes understanding of instructions and care plan provided today and agrees to view in MyChart. Active MyChart status and patient understanding of how to access instructions and care plan via MyChart confirmed with patient.     Jenel Lucks, MSW, LCSW [facility] Care Management [place]  [facility] [email or web address] Phone [phone] 5:30 AM

## 2023-04-28 NOTE — Patient Outreach (Signed)
  Care Coordination   Follow Up Visit Note   04/24/2023 Name: Janice Adams MRN: [id number] DOB: Dec 16, 1955  Janice Adams is a 67 y.o. year old female who sees Storm Frisk, MD for primary care. I spoke with  [name] by phone today.  What matters to the patients health and wellness today?  Transportation and Medication    Goals Addressed             This Visit's Progress    Obtain supportive resources-Housing   On track    Activities and task to complete in order to accomplish goals.   Keep all upcoming appointments discussed today Continue with compliance of taking medication prescribed by Doctor Implement healthy coping skills discussed to assist with management of symptoms Continue working with [facility] care team to assist with goals identified         SDOH assessments and interventions completed:  No     Care Coordination Interventions:  Yes, provided  Interventions Today    Flowsheet Row Most Recent Value  General Interventions   General Interventions Discussed/Reviewed General Interventions Reviewed, [facility], Doctor Visits  [Transportation has been terrible, usually a good friend takes her. Pt was informed she has Medicaid and we can do SCAT app, if needed.]  Doctor Visits Discussed/Reviewed Doctor Visits Reviewed  Mental Health Interventions   Mental Health Discussed/Reviewed Mental Health Reviewed, Coping Strategies  Pharmacy Interventions   Pharmacy Dicussed/Reviewed Pharmacy Topics Reviewed, Medication Adherence  [Patient reports needing refills. Agreed to f/up with preferred pharmacy to see if refills are available, if not, she will contact PCP office]       Follow up plan: Follow up call scheduled for 1-4 weeks    Encounter Outcome:  Patient Visit Completed   Jenel Lucks, MSW, LCSW [facility] Care Management [facility] Health  [facility] [email or web address] Phone [phone] 5:29 AM

## 2023-05-01 DIAGNOSIS — R0902 Hypoxemia: Secondary | ICD-10-CM | POA: Diagnosis not present

## 2023-05-01 DIAGNOSIS — R9431 Abnormal electrocardiogram [ECG] [EKG]: Secondary | ICD-10-CM | POA: Diagnosis not present

## 2023-05-01 DIAGNOSIS — R109 Unspecified abdominal pain: Secondary | ICD-10-CM | POA: Diagnosis not present

## 2023-05-01 DIAGNOSIS — I440 Atrioventricular block, first degree: Secondary | ICD-10-CM | POA: Diagnosis not present

## 2023-05-01 DIAGNOSIS — Z85118 Personal history of other malignant neoplasm of bronchus and lung: Secondary | ICD-10-CM | POA: Diagnosis not present

## 2023-05-01 DIAGNOSIS — E860 Dehydration: Secondary | ICD-10-CM | POA: Diagnosis not present

## 2023-05-01 DIAGNOSIS — R0602 Shortness of breath: Secondary | ICD-10-CM | POA: Diagnosis not present

## 2023-05-01 DIAGNOSIS — R7989 Other specified abnormal findings of blood chemistry: Secondary | ICD-10-CM | POA: Diagnosis not present

## 2023-05-01 DIAGNOSIS — Z1152 Encounter for screening for COVID-19: Secondary | ICD-10-CM | POA: Diagnosis not present

## 2023-05-01 DIAGNOSIS — R0981 Nasal congestion: Secondary | ICD-10-CM | POA: Diagnosis not present

## 2023-05-01 DIAGNOSIS — J984 Other disorders of lung: Secondary | ICD-10-CM | POA: Diagnosis not present

## 2023-05-01 DIAGNOSIS — R61 Generalized hyperhidrosis: Secondary | ICD-10-CM | POA: Diagnosis not present

## 2023-05-01 DIAGNOSIS — M79604 Pain in right leg: Secondary | ICD-10-CM | POA: Diagnosis not present

## 2023-05-01 DIAGNOSIS — R509 Fever, unspecified: Secondary | ICD-10-CM | POA: Diagnosis not present

## 2023-05-01 DIAGNOSIS — I959 Hypotension, unspecified: Secondary | ICD-10-CM | POA: Diagnosis not present

## 2023-05-01 DIAGNOSIS — Z902 Acquired absence of lung [part of]: Secondary | ICD-10-CM | POA: Diagnosis not present

## 2023-05-01 DIAGNOSIS — R059 Cough, unspecified: Secondary | ICD-10-CM | POA: Diagnosis not present

## 2023-05-01 DIAGNOSIS — J9811 Atelectasis: Secondary | ICD-10-CM | POA: Diagnosis not present

## 2023-05-01 DIAGNOSIS — R55 Syncope and collapse: Secondary | ICD-10-CM | POA: Diagnosis not present

## 2023-05-01 DIAGNOSIS — R110 Nausea: Secondary | ICD-10-CM | POA: Diagnosis not present

## 2023-05-02 DIAGNOSIS — Z85118 Personal history of other malignant neoplasm of bronchus and lung: Secondary | ICD-10-CM | POA: Diagnosis not present

## 2023-05-02 DIAGNOSIS — R7989 Other specified abnormal findings of blood chemistry: Secondary | ICD-10-CM | POA: Diagnosis not present

## 2023-05-02 DIAGNOSIS — R109 Unspecified abdominal pain: Secondary | ICD-10-CM | POA: Diagnosis not present

## 2023-05-02 DIAGNOSIS — R110 Nausea: Secondary | ICD-10-CM | POA: Diagnosis not present

## 2023-05-02 DIAGNOSIS — R61 Generalized hyperhidrosis: Secondary | ICD-10-CM | POA: Diagnosis not present

## 2023-05-02 DIAGNOSIS — M79604 Pain in right leg: Secondary | ICD-10-CM | POA: Diagnosis not present

## 2023-09-04 ENCOUNTER — Telehealth: Payer: Self-pay | Admitting: Medical Oncology

## 2023-09-04 NOTE — Telephone Encounter (Signed)
 Pt is moving to [place] . She wants a letter stating she has She wants to see Marguerita Shih due to her

## 2023-09-11 ENCOUNTER — Telehealth: Payer: Self-pay | Admitting: Medical Oncology

## 2023-09-11 ENCOUNTER — Encounter: Payer: Self-pay | Admitting: Internal Medicine

## 2023-09-11 ENCOUNTER — Encounter: Payer: Self-pay | Admitting: Medical Oncology

## 2023-09-11 NOTE — Telephone Encounter (Signed)
 Letter needed by dtr  to get oncology care in [place] . Letter placed in Janice Adams's green folder.

## 2023-09-13 ENCOUNTER — Telehealth: Payer: Self-pay | Admitting: Medical Oncology

## 2023-09-13 NOTE — Telephone Encounter (Signed)
 I told Janice Adams that if pt needs letter for section 8 housing she needs to see PCP or attorney. Dr. Marguerita Shih does not do this kind of letter . Pt has not seen Mohamed since 2022.

## 2023-10-15 ENCOUNTER — Encounter: Payer: Self-pay | Admitting: Internal Medicine

## 2023-11-07 ENCOUNTER — Encounter: Payer: Self-pay | Admitting: Internal Medicine

## 2023-11-14 ENCOUNTER — Ambulatory Visit: Payer: 59 | Attending: Critical Care Medicine
